# Patient Record
Sex: Female | Born: 1950
Health system: Southern US, Community
[De-identification: ages and names within clinical notes are randomized; demographics above are authoritative.]

## PROBLEM LIST (undated history)

## (undated) DIAGNOSIS — E785 Hyperlipidemia, unspecified: Secondary | ICD-10-CM

## (undated) DIAGNOSIS — H269 Unspecified cataract: Secondary | ICD-10-CM

## (undated) DIAGNOSIS — H409 Unspecified glaucoma: Secondary | ICD-10-CM

## (undated) DIAGNOSIS — R011 Cardiac murmur, unspecified: Secondary | ICD-10-CM

## (undated) DIAGNOSIS — M1990 Unspecified osteoarthritis, unspecified site: Secondary | ICD-10-CM

## (undated) DIAGNOSIS — C801 Malignant (primary) neoplasm, unspecified: Secondary | ICD-10-CM

## (undated) DIAGNOSIS — R42 Dizziness and giddiness: Secondary | ICD-10-CM

## (undated) DIAGNOSIS — I10 Essential (primary) hypertension: Secondary | ICD-10-CM

## (undated) DIAGNOSIS — J302 Other seasonal allergic rhinitis: Secondary | ICD-10-CM

## (undated) DIAGNOSIS — K219 Gastro-esophageal reflux disease without esophagitis: Secondary | ICD-10-CM

## (undated) DIAGNOSIS — F419 Anxiety disorder, unspecified: Secondary | ICD-10-CM

## (undated) DIAGNOSIS — T7840XA Allergy, unspecified, initial encounter: Secondary | ICD-10-CM

## (undated) HISTORY — DX: Unspecified glaucoma: H40.9

## (undated) HISTORY — PX: COLONOSCOPY: SHX174

## (undated) HISTORY — DX: Gastro-esophageal reflux disease without esophagitis: K21.9

## (undated) HISTORY — DX: Unspecified osteoarthritis, unspecified site: M19.90

## (undated) HISTORY — DX: Allergy, unspecified, initial encounter: T78.40XA

## (undated) HISTORY — DX: Malignant (primary) neoplasm, unspecified: C80.1

## (undated) HISTORY — DX: Hyperlipidemia, unspecified: E78.5

## (undated) HISTORY — DX: Other seasonal allergic rhinitis: J30.2

## (undated) HISTORY — DX: Dizziness and giddiness: R42

## (undated) HISTORY — DX: Anxiety disorder, unspecified: F41.9

## (undated) HISTORY — DX: Cardiac murmur, unspecified: R01.1

## (undated) HISTORY — PX: WISDOM TOOTH EXTRACTION: SHX21

## (undated) HISTORY — DX: Essential (primary) hypertension: I10

## (undated) HISTORY — DX: Unspecified cataract: H26.9

## (undated) SURGERY — MANOMETRY, ESOPHAGUS

## (undated) SURGERY — Surgical Case
Anesthesia: *Unknown

---

## 1970-11-27 DIAGNOSIS — C55 Malignant neoplasm of uterus, part unspecified: Secondary | ICD-10-CM

## 1970-11-27 HISTORY — PX: ABDOMINAL HYSTERECTOMY: SHX81

## 1970-11-27 HISTORY — DX: Malignant neoplasm of uterus, part unspecified: C55

## 1998-03-29 ENCOUNTER — Other Ambulatory Visit: Admission: RE | Admit: 1998-03-29 | Discharge: 1998-03-29 | Payer: Self-pay | Admitting: Obstetrics

## 1999-03-21 ENCOUNTER — Other Ambulatory Visit: Admission: RE | Admit: 1999-03-21 | Discharge: 1999-03-21 | Payer: Self-pay | Admitting: Obstetrics

## 1999-04-26 ENCOUNTER — Ambulatory Visit (HOSPITAL_COMMUNITY): Admission: RE | Admit: 1999-04-26 | Discharge: 1999-04-26 | Payer: Self-pay | Admitting: *Deleted

## 2000-02-27 ENCOUNTER — Other Ambulatory Visit: Admission: RE | Admit: 2000-02-27 | Discharge: 2000-02-27 | Payer: Self-pay | Admitting: Obstetrics

## 2000-04-27 ENCOUNTER — Ambulatory Visit (HOSPITAL_COMMUNITY): Admission: RE | Admit: 2000-04-27 | Discharge: 2000-04-27 | Payer: Self-pay | Admitting: *Deleted

## 2000-05-03 ENCOUNTER — Encounter: Payer: Self-pay | Admitting: Obstetrics

## 2000-05-03 ENCOUNTER — Encounter: Admission: RE | Admit: 2000-05-03 | Discharge: 2000-05-03 | Payer: Self-pay | Admitting: Obstetrics

## 2001-05-06 ENCOUNTER — Encounter: Admission: RE | Admit: 2001-05-06 | Discharge: 2001-05-06 | Payer: Self-pay | Admitting: Obstetrics

## 2001-05-06 ENCOUNTER — Encounter: Payer: Self-pay | Admitting: Obstetrics

## 2002-05-06 ENCOUNTER — Encounter: Admission: RE | Admit: 2002-05-06 | Discharge: 2002-05-06 | Payer: Self-pay | Admitting: Obstetrics

## 2002-05-06 ENCOUNTER — Encounter: Payer: Self-pay | Admitting: Obstetrics

## 2003-05-11 ENCOUNTER — Encounter: Admission: RE | Admit: 2003-05-11 | Discharge: 2003-05-11 | Payer: Self-pay | Admitting: Obstetrics

## 2003-05-11 ENCOUNTER — Encounter: Payer: Self-pay | Admitting: Obstetrics

## 2004-06-16 ENCOUNTER — Encounter: Admission: RE | Admit: 2004-06-16 | Discharge: 2004-06-16 | Payer: Self-pay | Admitting: Internal Medicine

## 2005-06-26 ENCOUNTER — Encounter: Admission: RE | Admit: 2005-06-26 | Discharge: 2005-06-26 | Payer: Self-pay | Admitting: Internal Medicine

## 2006-06-28 ENCOUNTER — Encounter: Admission: RE | Admit: 2006-06-28 | Discharge: 2006-06-28 | Payer: Self-pay | Admitting: Internal Medicine

## 2007-07-02 ENCOUNTER — Encounter: Admission: RE | Admit: 2007-07-02 | Discharge: 2007-07-02 | Payer: Self-pay | Admitting: Internal Medicine

## 2007-07-26 ENCOUNTER — Ambulatory Visit: Payer: Self-pay | Admitting: Gastroenterology

## 2007-08-13 ENCOUNTER — Ambulatory Visit: Payer: Self-pay | Admitting: Gastroenterology

## 2007-08-13 ENCOUNTER — Encounter: Payer: Self-pay | Admitting: Gastroenterology

## 2007-09-18 ENCOUNTER — Ambulatory Visit: Payer: Self-pay | Admitting: Gastroenterology

## 2007-10-03 ENCOUNTER — Ambulatory Visit: Payer: Self-pay | Admitting: Gastroenterology

## 2007-10-16 ENCOUNTER — Ambulatory Visit: Payer: Self-pay | Admitting: Gastroenterology

## 2007-10-16 ENCOUNTER — Encounter: Payer: Self-pay | Admitting: Gastroenterology

## 2007-11-13 ENCOUNTER — Ambulatory Visit: Payer: Self-pay | Admitting: Gastroenterology

## 2008-01-24 DIAGNOSIS — K2980 Duodenitis without bleeding: Secondary | ICD-10-CM | POA: Insufficient documentation

## 2008-01-24 DIAGNOSIS — K3189 Other diseases of stomach and duodenum: Secondary | ICD-10-CM | POA: Insufficient documentation

## 2008-01-24 DIAGNOSIS — K253 Acute gastric ulcer without hemorrhage or perforation: Secondary | ICD-10-CM | POA: Insufficient documentation

## 2008-01-24 DIAGNOSIS — D126 Benign neoplasm of colon, unspecified: Secondary | ICD-10-CM | POA: Insufficient documentation

## 2008-01-24 DIAGNOSIS — R1013 Epigastric pain: Secondary | ICD-10-CM

## 2008-07-09 ENCOUNTER — Encounter: Admission: RE | Admit: 2008-07-09 | Discharge: 2008-07-09 | Payer: Self-pay | Admitting: Internal Medicine

## 2009-07-15 ENCOUNTER — Encounter: Admission: RE | Admit: 2009-07-15 | Discharge: 2009-07-15 | Payer: Self-pay | Admitting: Internal Medicine

## 2009-08-25 ENCOUNTER — Encounter: Admission: RE | Admit: 2009-08-25 | Discharge: 2009-08-25 | Payer: Self-pay | Admitting: Internal Medicine

## 2010-05-10 ENCOUNTER — Encounter: Admission: RE | Admit: 2010-05-10 | Discharge: 2010-08-08 | Payer: Self-pay | Admitting: Neurology

## 2010-07-21 ENCOUNTER — Encounter: Admission: RE | Admit: 2010-07-21 | Discharge: 2010-07-21 | Payer: Self-pay | Admitting: Internal Medicine

## 2010-08-11 ENCOUNTER — Encounter: Admission: RE | Admit: 2010-08-11 | Discharge: 2010-08-29 | Payer: Self-pay | Admitting: Neurology

## 2010-12-27 NOTE — Procedures (Signed)
Summary: Gastroenterology EGD  Gastroenterology EGD   Imported By: Lowry Ram CMA 01/24/2008 11:30:07  _____________________________________________________________________  External Attachment:    Type:   Image     Comment:   External Document

## 2011-04-11 NOTE — Letter (Signed)
September 18, 2007    [name]. Merla Riches, M.D.  [address]  [place], [place] [address]   RE:  [name], [name]  MRN:  [id number]  /  DOB:  [birth year]   Dear Dr. Merla Riches:   Upon your kind referral, I had the pleasure of evaluating your patient  and I am pleased to offer my findings.  I saw Breanna Sharp in the  office today.  Enclosed is a copy of my progress note that details my  findings and recommendations.   Thank you for the opportunity to participate in your patient's care.    Sincerely,      Barbette Hair. Arlyce Dice, MD,FACG  Electronically Signed    RDK/MedQ  DD: 09/18/2007  DT: 09/19/2007  Job #: [id number]

## 2011-04-11 NOTE — Assessment & Plan Note (Signed)
 HEALTHCARE                         GASTROENTEROLOGY OFFICE NOTE   Breanna Sharp, Breanna Sharp                      MRN:          [id number]  DATE:11/13/2007                            DOB:          Jul 19, 1951    PROBLEM:  Dyspepsia.   [name] has returned for re-evaluation. Upper endoscopy  demonstrated a pre-pyloric ulcer. Biopsies were negative for H-pylori.  She also has some mild duodenitis.   On Pepcid 40 mg a day, her abdominal pain has entirely subsided.   PHYSICAL EXAMINATION:  Pulse 76, blood pressure 118/76, weight 170.   IMPRESSION:  Abdominal pain, secondary to pre-pyloric ulcer, resolved.   RECOMMENDATIONS:  Complete six week course of Pepcid. [name] will  return as needed.     Barbette Hair. Arlyce Dice, MD,FACG  Electronically Signed    RDK/MedQ  DD: 11/13/2007  DT: 11/13/2007  Job #: [id number]   cc:   [name]. Merla Riches, M.D.

## 2011-04-11 NOTE — Assessment & Plan Note (Signed)
[facility]                         GASTROENTEROLOGY OFFICE NOTE   Breanna, Sharp                      MRN:          [id number]  DATE:09/18/2007                            DOB:          05/18/1951    REASON FOR CONSULTATION:  Abdominal distention.   Breanna Sharp is a 60 year old African-American female, referred through  the courtesy of Dr. Merla Riches for evaluation.  For the last several  years, she has noticed abdominal bloating.  This is a constant problem,  not particularly worsened postprandially.  She has gained 30 pounds over  the past 3 years, but she is unsure that this is due to her gain in  weight.  She underwent an abdominal ultrasound, though the results are  not known.  She denies nausea, pyrosis, or abdominal pain, per se.  Colonoscopy on August 13, 2007, demonstrated a hyperplastic polyp.  She denies change of bowel habits or hematochezia.  She also denies  excess eructations or flatus.  She has a history of adenomatous colon  polyps.   PAST MEDICAL HISTORY:  Hysterectomy.   FAMILY HISTORY:  Noncontributory.   MEDICATIONS:  HCTZ, fexofenadine, and baby aspirin.   She neither smokes nor drinks.  She is married and works for [employer].   REVIEW OF SYSTEMS:  Positive for some shortness of breath and muscle  spasms.   EXAM:  Pulse 78, blood pressure 104/70, weight 170.   PHYSICAL EXAMINATION:  HEENT: EOMI.  PERRLA.  Sclerae are anicteric.  Conjunctivae are pink.  NECK:  Supple without thyromegaly, adenopathy or carotid bruits.  CHEST:  Clear to auscultation and percussion without adventitious  sounds.  CARDIAC:  Regular rhythm; normal S1 S2.  There are no murmurs, gallops  or rubs.  ABDOMEN:  There is very slight distention.  Abdomen is full.  There is  no frank succussion splash or obvious ascites.  There are no abdominal  masses or organomegaly.  EXTREMITIES:  Full range of motion.  No cyanosis, clubbing  or edema.  RECTAL:  Deferred.   IMPRESSION:  Persistent abdominal fullness and distention.  It is not  certain whether this is due to pannus or whether she has underlying  bowel distention or perhaps even ascites.  The ascites is not evident by  clinical exam.   RECOMMENDATION:  1. Review previous ultrasound.  2. To consider upper endoscopy per the patient's request.     Barbette Hair. Arlyce Dice, MD,FACG  Electronically Signed    RDK/MedQ  DD: 09/18/2007  DT: 09/19/2007  Job #: [id number]   cc:   Breanna Sharp. Merla Riches, M.D.

## 2011-04-14 NOTE — Procedures (Signed)
[facility]  Patient:    Breanna Sharp, SCHOMBURG                      MRN: [id number] Proc. Date: 04/27/00 Adm. Date:  [id number] Disc. Date: [id number] Attending:  Sharyn Dross                           Procedure Report  PRE PROCEDURE DIAGNOSIS:  History of colon polyps.  POST PROCEDURE DIAGNOSIS:  Normal colonoscopic examination to the cecum.  PROCEDURE:  Colonoscopy.  MEDICATIONS:  Demerol 60 mg IV and Versed 7 mg IV over a ten minute period of time.  INSTRUMENT:  Olympus video pancolonoscope.  ENDOSCOPIST:  Sharyn Dross., M.D.  INDICATIONS FOR PROCEDURE:  This pleasant 60 year old female was referred for evaluation at this time.  She was relatively stable without any major complaints at this point.  There is a long history of a small colon polyp in the past.  The patient was brought back in for reevaluation at this time.  OBJECTIVE:  GENERAL:  Very pleasant female who appears to be in no acute distress.  VITAL SIGNS:  Stable.  HEENT:  Anicteric.  NECK:  Supple.  LUNGS:  Clear.  HEART:  Regular rate and rhythm without heaves, thrills, murmurs or gallops.  ABDOMEN:  Soft.  No tenderness, no hepatosplenomegaly.  EXTREMITIES:  Unremarkable.  PLAN:  Proceed with colonoscopic examination.  INFORMED CONSENT:  The patient was advised of the procedure, indications and the risks involved.  The patient has agreed to have the procedure performed. The video was reviewed and consent form obtained.  PREOPERATIVE PREPARATION:  the patient was brought to the endoscopy unit, where an IV for sedative medications was used.  Monitors were placed on the patient to monitor the patients vital signs and oxygen saturation.  Nasal oxygen at 2 L per minute was used and, once adequate sedation was performed, the procedure was begun.  BOWEL PREPARATION:  The patient was given GoLYTELY and Reglan as bowel prep. To tolerated the prep well without any  apparent complications.  The quality of the prep was excellent.  DESCRIPTION OF PROCEDURE:  The instrument was advanced with the patient lying in the left lateral decubitus position approximately 97 cm into the proximal colon to the cecum.  This was confirmed by palpation, transillumination as well as visualization of the appendiceal orifice and the ileocecal valve.  There appeared to be no gross abnormalities such as masses, polyps or stricture lesions appreciated.  The vascular pattern appeared to be well within normal limits throughout the entire colon.  The mucosal pattern showed no evidence of any granular pattern or diverticular changes at this time. There was no evidence of internal or external hemorrhoids upon exiting from the area.  There was no increased tortuosity of the colon that was noted.  The instrument was removed per rectum without difficulty without any evidence of internal or external hemorrhoids noted.  The patient tolerated the procedure well.  TREATMENT: 1. Conservative management. 2. Will have the patient follow up with me in the office at this time.  LEVEL OF DIFFICULTY:  1/5.  RECOMMENDATIONS:  Use the same standard instruments for any further procedures at this time. DD:  04/27/00 TD:  05/01/00 Job: [id number]

## 2011-06-12 ENCOUNTER — Other Ambulatory Visit: Payer: Self-pay | Admitting: Internal Medicine

## 2011-06-12 DIAGNOSIS — Z1231 Encounter for screening mammogram for malignant neoplasm of breast: Secondary | ICD-10-CM

## 2011-07-25 ENCOUNTER — Ambulatory Visit
Admission: RE | Admit: 2011-07-25 | Discharge: 2011-07-25 | Disposition: A | Discharge status: Home / Self Care | Payer: Managed Care, Other (non HMO) | Source: Ambulatory Visit | Attending: Internal Medicine | Admitting: Internal Medicine

## 2011-07-25 DIAGNOSIS — Z1231 Encounter for screening mammogram for malignant neoplasm of breast: Secondary | ICD-10-CM

## 2012-05-15 ENCOUNTER — Ambulatory Visit (INDEPENDENT_AMBULATORY_CARE_PROVIDER_SITE_OTHER): Payer: Managed Care, Other (non HMO) | Admitting: Family Medicine

## 2012-05-15 VITALS — BP 138/85 | HR 75 | Temp 97.5°F | Resp 16 | Ht 64.0 in | Wt 170.0 lb

## 2012-05-15 DIAGNOSIS — N898 Other specified noninflammatory disorders of vagina: Secondary | ICD-10-CM

## 2012-05-15 DIAGNOSIS — B9689 Other specified bacterial agents as the cause of diseases classified elsewhere: Secondary | ICD-10-CM

## 2012-05-15 DIAGNOSIS — N760 Acute vaginitis: Secondary | ICD-10-CM

## 2012-05-15 LAB — POCT WET PREP WITH KOH
Clue Cells Wet Prep HPF POC: 100
KOH Prep POC: NEGATIVE
Trichomonas, UA: NEGATIVE
Yeast Wet Prep HPF POC: NEGATIVE

## 2012-05-15 MED ORDER — METRONIDAZOLE 500 MG PO TABS: 500.0000 mg | ORAL_TABLET | Freq: Two times a day (BID) | ORAL | Status: AC

## 2012-05-15 NOTE — Progress Notes (Signed)
Patient Name: Breanna Sharp Date of Birth: 05/14/1951 Medical Record Number: [id number] Gender: female Date of Encounter: 05/15/2012  History of Present Illness:  Breanna Sharp is a 61 y.o. very pleasant female patient who presents with the following:  Here today with concern regarding a vaginal yeast infection. She has not had one "in years."  She has noted no itching, but she does have "dampness" and mild odor.  No vaginal bleeding.  No dysuria, no unusual urinary frequency.    Patient Active Problem List  Diagnosis  . COLONIC POLYPS  . ACUT GASTR ULCER W/O MENTION HEMORR PERF/OBST  . DUODENITIS  . DYSPEPSIA   No past medical history on file. No past surgical history on file. History  Substance Use Topics  . Smoking status: Never Smoker   . Smokeless tobacco: Not on file  . Alcohol Use: 0.0 oz/week     social   No family history on file. Allergies  Allergen Reactions  . Statins     Muscle cramps    Medication list has been reviewed and updated.  Prior to Admission medications   Medication Sig Start Date End Date Taking? Authorizing Provider  calcium carbonate (OS-CAL) 600 MG TABS Take 600 mg by mouth 2 (two) times daily with a meal.   Yes Historical Provider, MD  cholecalciferol (VITAMIN D) 1000 UNITS tablet Take 1,000 Units by mouth daily.   Yes Historical Provider, MD  fexofenadine (ALLEGRA) 180 MG tablet Take 180 mg by mouth daily.   Yes Historical Provider, MD  fluticasone (FLONASE) 50 MCG/ACT nasal spray Place 2 sprays into the nose daily.   Yes Historical Provider, MD  lisinopril-hydrochlorothiazide (PRINZIDE,ZESTORETIC) 10-12.5 MG per tablet Take 1 tablet by mouth daily.   Yes Historical Provider, MD  rosuvastatin (CRESTOR) 10 MG tablet Take 10 mg by mouth daily.   Yes Historical Provider, MD    Review of Systems:  As per HPI- otherwise negative. She does feel that she is at any risk of STI  Physical Examination: Filed Vitals:   05/15/12 0839   BP: 138/85  Pulse: 75  Temp: 97.5 F (36.4 C)  Resp: 16   Filed Vitals:   05/15/12 0839  Height: 5\' 4"  (1.626 m)  Weight: 170 lb (77.111 kg)   Body mass index is 29.18 kg/(m^2). Ideal Body Weight: Weight in (lb) to have BMI = 25: 145.3   GEN: WDWN, NAD, Non-toxic, A & O x 3 HEENT: Atraumatic, Normocephalic. Neck supple. No masses, No LAD. Ears and Nose: No external deformity. CV: RRR, No M/G/R. No JVD. No thrill. No extra heart sounds. PULM: CTA B, no wheezes, crackles, rhonchi. No retractions. No resp. distress. No accessory muscle use. ABD: S, NT, ND, +BS. No rebound. No HSM. EXTR: No c/c/e NEURO Normal gait.  PSYCH: Normally interactive. Conversant. Not depressed or anxious appearing.  Calm demeanor.  GU: normal internal and external exam.  She thinks she may have had an oophorectomy at some point, but is not sure  Results for orders placed in visit on 05/15/12  POCT WET PREP WITH KOH      Component Value Range   Trichomonas, UA Negative     Clue Cells Wet Prep HPF POC 100%     Epithelial Wet Prep HPF POC 6-8     Yeast Wet Prep HPF POC NEG     Bacteria Wet Prep HPF POC 3+     RBC Wet Prep HPF POC 1-3     WBC Wet  Prep HPF POC 10-15     KOH Prep POC Negative     Assessment and Plan: 1. Vaginal Discharge  POCT Wet Prep with KOH  2. Bacterial vaginosis  metroNIDAZOLE (FLAGYL) 500 MG tablet   Treat BV as above.  Patient (or parent if minor) instructed to return to clinic or call if not better in 3-4 day(s).    Abbe Amsterdam, MD

## 2012-08-02 ENCOUNTER — Other Ambulatory Visit: Payer: Self-pay | Admitting: Internal Medicine

## 2012-08-06 ENCOUNTER — Other Ambulatory Visit: Payer: Self-pay | Admitting: Internal Medicine

## 2012-08-06 DIAGNOSIS — Z1231 Encounter for screening mammogram for malignant neoplasm of breast: Secondary | ICD-10-CM

## 2012-08-09 ENCOUNTER — Ambulatory Visit: Payer: Managed Care, Other (non HMO)

## 2012-08-14 ENCOUNTER — Encounter: Payer: Self-pay | Admitting: Internal Medicine

## 2012-08-14 ENCOUNTER — Ambulatory Visit (INDEPENDENT_AMBULATORY_CARE_PROVIDER_SITE_OTHER): Payer: Managed Care, Other (non HMO) | Admitting: Internal Medicine

## 2012-08-14 VITALS — BP 140/78 | HR 75 | Temp 98.1°F | Resp 16 | Ht 63.5 in | Wt 164.4 lb

## 2012-08-14 DIAGNOSIS — E785 Hyperlipidemia, unspecified: Secondary | ICD-10-CM

## 2012-08-14 DIAGNOSIS — Z23 Encounter for immunization: Secondary | ICD-10-CM

## 2012-08-14 DIAGNOSIS — Z0000 Encounter for general adult medical examination without abnormal findings: Secondary | ICD-10-CM

## 2012-08-14 DIAGNOSIS — Z6828 Body mass index (BMI) 28.0-28.9, adult: Secondary | ICD-10-CM | POA: Insufficient documentation

## 2012-08-14 DIAGNOSIS — R42 Dizziness and giddiness: Secondary | ICD-10-CM | POA: Insufficient documentation

## 2012-08-14 DIAGNOSIS — I10 Essential (primary) hypertension: Secondary | ICD-10-CM

## 2012-08-14 LAB — CBC WITH DIFFERENTIAL/PLATELET
Basophils Absolute: 0 10*3/uL (ref 0.0–0.1)
Basophils Relative: 0 % (ref 0–1)
Eosinophils Absolute: 0.1 10*3/uL (ref 0.0–0.7)
Eosinophils Relative: 1 % (ref 0–5)
HCT: 38.6 % (ref 36.0–46.0)
Hemoglobin: 12.9 g/dL (ref 12.0–15.0)
Lymphocytes Relative: 31 % (ref 12–46)
Lymphs Abs: 1.8 10*3/uL (ref 0.7–4.0)
MCH: 27.8 pg (ref 26.0–34.0)
MCHC: 33.4 g/dL (ref 30.0–36.0)
MCV: 83.2 fL (ref 78.0–100.0)
Monocytes Absolute: 0.5 10*3/uL (ref 0.1–1.0)
Monocytes Relative: 8 % (ref 3–12)
Neutro Abs: 3.4 10*3/uL (ref 1.7–7.7)
Neutrophils Relative %: 60 % (ref 43–77)
Platelets: 269 10*3/uL (ref 150–400)
RBC: 4.64 MIL/uL (ref 3.87–5.11)
RDW: 13.1 % (ref 11.5–15.5)
WBC: 5.8 10*3/uL (ref 4.0–10.5)

## 2012-08-14 LAB — COMPREHENSIVE METABOLIC PANEL
ALT: 22 U/L (ref 0–35)
AST: 22 U/L (ref 0–37)
Albumin: 4.8 g/dL (ref 3.5–5.2)
Alkaline Phosphatase: 64 U/L (ref 39–117)
BUN: 14 mg/dL (ref 6–23)
CO2: 29 mEq/L (ref 19–32)
Calcium: 10 mg/dL (ref 8.4–10.5)
Chloride: 102 mEq/L (ref 96–112)
Creat: 0.77 mg/dL (ref 0.50–1.10)
Glucose, Bld: 107 mg/dL — ABNORMAL HIGH (ref 70–99)
Potassium: 3.9 mEq/L (ref 3.5–5.3)
Sodium: 140 mEq/L (ref 135–145)
Total Bilirubin: 0.6 mg/dL (ref 0.3–1.2)
Total Protein: 7.2 g/dL (ref 6.0–8.3)

## 2012-08-14 LAB — POCT URINALYSIS DIPSTICK
Bilirubin, UA: NEGATIVE
Blood, UA: NEGATIVE
Glucose, UA: NEGATIVE
Ketones, UA: NEGATIVE
Leukocytes, UA: NEGATIVE
Nitrite, UA: NEGATIVE
Protein, UA: NEGATIVE
Spec Grav, UA: 1.01
Urobilinogen, UA: 0.2
pH, UA: 5.5

## 2012-08-14 LAB — LIPID PANEL
Cholesterol: 213 mg/dL — ABNORMAL HIGH (ref 0–200)
HDL: 55 mg/dL (ref >39)
LDL Cholesterol: 134 mg/dL — ABNORMAL HIGH (ref 0–99)
Total CHOL/HDL Ratio: 3.9 Ratio
Triglycerides: 122 mg/dL (ref <150)
VLDL: 24 mg/dL (ref 0–40)

## 2012-08-14 MED ORDER — LISINOPRIL-HYDROCHLOROTHIAZIDE 10-12.5 MG PO TABS: 1.0000 | ORAL_TABLET | Freq: Every day | ORAL | Status: DC

## 2012-08-14 MED ORDER — FLUTICASONE PROPIONATE 50 MCG/ACT NA SUSP: 2.0000 | Freq: Every day | NASAL | Status: DC

## 2012-08-14 NOTE — Progress Notes (Signed)
  Subjective:    Patient ID: Breanna Sharp, female    DOB: 06/21/1951, 61 y.o.   MRN: [id number]  HPIHere for annual physical Patient Active Problem List  Diagnosis  . COLONIC POLYPS  . ACUT GASTR ULCER W/O MENTION HEMORR PERF/OBST  . Vertigo---Has done very well with posture exercises-brandt daroff  . HTN (hypertension)---Outside pressures good  . Hyperlipidemia--No side effects of medication  . BMI 28.0-28.9,adult  Current outpatient prescriptions:calcium carbonate (OS-CAL) 600 MG TABS, Take 600 mg by mouth 2 (two) times daily with a meal., Disp: , Rfl: ;  cholecalciferol (VITAMIN D) 1000 UNITS tablet, Take 1,000 Units by mouth daily., Disp: , Rfl: ;  CRESTOR 10 MG tablet, TAKE 1 TABLET DAILY IN THE EVENING., Disp: 90 tablet, Rfl: 3;  fexofenadine (ALLEGRA) 180 MG tablet, Take 180 mg by mouth daily., Disp: , Rfl:  lisinopril-hydrochlorothiazide (PRINZIDE,ZESTORETIC) 10-12.5 MG per tablet, Take 1 tablet by mouth daily., Disp: 90 tablet, Rfl: 3;  DISCONTD: lisinopril-hydrochlorothiazide (PRINZIDE,ZESTORETIC) 10-12.5 MG per tablet, Take 1 tablet by mouth daily., Disp: , Rfl: ;  fluticasone (FLONASE) 50 MCG/ACT nasal spray, Place 2 sprays into the nose daily., Disp: 16 g, Rfl: 10 DISCONTD: fluticasone (FLONASE) 50 MCG/ACT nasal spray, Place 2 sprays into the nose daily., Disp: , Rfl:   Is feeling well and 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   HENT: Positive for neck pain and neck stiffness.   Eyes: Negative.   Respiratory: Negative.   Cardiovascular: Negative.   Gastrointestinal: Negative.   Genitourinary: Negative.   Skin: Negative.   Neurological: Positive for dizziness and numbness.  Hematological: Negative.   Psychiatric/Behavioral: Negative.        Objective:   Physical Exam  Filed Vitals:   08/14/12 1037  BP: 140/78  Pulse: 75  Temp: 98.1 F (36.7 C)    Resp: 16   HEENT clear No thyromegaly or adenopathy Heart regular without murmurs clicks or rubs No carotid or abdominal bruits Lungs clear Abdomen supple without organomegaly or masses Range of motion about large joints stable Back straight Straight leg raise within normal limits Extremities with full peripheral pulses and no edema Neurological intact Psychiatric stable      Assessment & Plan:  Annual exam 1. Routine general medical examination at a health care facility  IFOBT POC (occult bld, rslt in office), CBC with Differential, Lipid panel, Comprehensive metabolic panel, TSH, POCT urinalysis dipstick  2. Vertigo    3. HTN (hypertension)  Lipid panel  4. hyperlipidemia  Lipid panel  5. BMI 28.0-28.9,adult  Lipid panel  6. Need for prophylactic vaccination and inoculation against influenza  Flu vaccine greater than or equal to 3yo with preservative IM   2 continue to focus on weight loss although this has been difficult Meds ordered this encounter  Medications  . lisinopril-hydrochlorothiazide (PRINZIDE,ZESTORETIC) 10-12.5 MG per tablet    Sig: Take 1 tablet by mouth daily.    Dispense:  90 tablet    Refill:  3  . fluticasone (FLONASE) 50 MCG/ACT nasal spray    Sig: Place 2 sprays into the nose daily.    Dispense:  16 g    Refill:  10   Has just been prescribed 90 days of crest or with 3 refills

## 2012-08-15 LAB — TSH: TSH: 1.145 u[IU]/mL (ref 0.350–4.500)

## 2012-08-16 ENCOUNTER — Ambulatory Visit
Admission: RE | Admit: 2012-08-16 | Discharge: 2012-08-16 | Disposition: A | Discharge status: Home / Self Care | Payer: Managed Care, Other (non HMO) | Source: Ambulatory Visit | Attending: Internal Medicine | Admitting: Internal Medicine

## 2012-08-16 DIAGNOSIS — Z1231 Encounter for screening mammogram for malignant neoplasm of breast: Secondary | ICD-10-CM

## 2012-08-20 ENCOUNTER — Encounter: Payer: Self-pay | Admitting: Internal Medicine

## 2012-09-19 LAB — IFOBT (OCCULT BLOOD): IFOBT: NEGATIVE

## 2013-02-26 ENCOUNTER — Ambulatory Visit (INDEPENDENT_AMBULATORY_CARE_PROVIDER_SITE_OTHER): Payer: 59 | Admitting: Internal Medicine

## 2013-02-26 ENCOUNTER — Encounter: Payer: Self-pay | Admitting: Internal Medicine

## 2013-02-26 VITALS — BP 132/84 | HR 80 | Temp 98.2°F | Resp 18 | Ht 64.0 in | Wt 168.0 lb

## 2013-02-26 DIAGNOSIS — I10 Essential (primary) hypertension: Secondary | ICD-10-CM

## 2013-02-26 DIAGNOSIS — E785 Hyperlipidemia, unspecified: Secondary | ICD-10-CM

## 2013-02-26 LAB — CBC WITH DIFFERENTIAL/PLATELET
Basophils Absolute: 0 10*3/uL (ref 0.0–0.1)
Basophils Relative: 0 % (ref 0–1)
Eosinophils Absolute: 0.1 10*3/uL (ref 0.0–0.7)
Eosinophils Relative: 2 % (ref 0–5)
HCT: 37.6 % (ref 36.0–46.0)
Hemoglobin: 12.3 g/dL (ref 12.0–15.0)
Lymphocytes Relative: 42 % (ref 12–46)
Lymphs Abs: 2.2 10*3/uL (ref 0.7–4.0)
MCH: 27.2 pg (ref 26.0–34.0)
MCHC: 32.7 g/dL (ref 30.0–36.0)
MCV: 83.2 fL (ref 78.0–100.0)
Monocytes Absolute: 0.4 10*3/uL (ref 0.1–1.0)
Monocytes Relative: 8 % (ref 3–12)
Neutro Abs: 2.6 10*3/uL (ref 1.7–7.7)
Neutrophils Relative %: 48 % (ref 43–77)
Platelets: 244 10*3/uL (ref 150–400)
RBC: 4.52 MIL/uL (ref 3.87–5.11)
RDW: 13.8 % (ref 11.5–15.5)
WBC: 5.3 10*3/uL (ref 4.0–10.5)

## 2013-02-26 LAB — LIPID PANEL
Cholesterol: 317 mg/dL — ABNORMAL HIGH (ref 0–200)
HDL: 53 mg/dL (ref >39)
LDL Cholesterol: 217 mg/dL — ABNORMAL HIGH (ref 0–99)
Total CHOL/HDL Ratio: 6 Ratio
Triglycerides: 236 mg/dL — ABNORMAL HIGH (ref <150)
VLDL: 47 mg/dL — ABNORMAL HIGH (ref 0–40)

## 2013-02-26 LAB — COMPREHENSIVE METABOLIC PANEL
ALT: 16 U/L (ref 0–35)
AST: 17 U/L (ref 0–37)
Albumin: 4.3 g/dL (ref 3.5–5.2)
Alkaline Phosphatase: 60 U/L (ref 39–117)
BUN: 13 mg/dL (ref 6–23)
CO2: 27 mEq/L (ref 19–32)
Calcium: 9.4 mg/dL (ref 8.4–10.5)
Chloride: 104 mEq/L (ref 96–112)
Creat: 0.85 mg/dL (ref 0.50–1.10)
Glucose, Bld: 111 mg/dL — ABNORMAL HIGH (ref 70–99)
Potassium: 4.1 mEq/L (ref 3.5–5.3)
Sodium: 140 mEq/L (ref 135–145)
Total Bilirubin: 0.7 mg/dL (ref 0.3–1.2)
Total Protein: 6.5 g/dL (ref 6.0–8.3)

## 2013-02-26 NOTE — Progress Notes (Signed)
  Subjective:    Patient ID: Breanna Sharp, female    DOB: [birth year], 62 y.o.   MRN: [id number]  HPIf/u HTN HL-at last labs started crestor as all prior statins created muscle complaints. This also leg to pain and easy fatigability of thigh muscles//she stopped it and sxtoms resolved  Has started exer prg//oatmeal/considering other supplements(Ginger, lemon garlic vinegar, honey)    Review of Systems Chest pain or palpitations  No headache  No visual changes  No peripheral edema     Objective:   Physical Exam BP 132/84  Pulse 80  Temp(Src) 98.2 F (36.8 C) (Oral)  Resp 18  Ht 5\' 4"  (1.626 m)  Wt 168 lb (76.204 kg)  BMI 28.82 kg/m2  SpO2 96% PERRLA/EOM conj Ht-reg Extr=no edema       Assessment & Plan   Problem #1 hyperlipidemia with intolerance to statins Recheck lipids Continue weight loss efforts Continue supplements Mail information with labs  Problem #2 hypertension Stable meds  Recheck 3-4 months at her request to see what her efforts do for reducing lipid profile

## 2013-02-28 ENCOUNTER — Encounter: Payer: Self-pay | Admitting: Internal Medicine

## 2013-03-18 ENCOUNTER — Ambulatory Visit (INDEPENDENT_AMBULATORY_CARE_PROVIDER_SITE_OTHER): Payer: 59 | Admitting: Internal Medicine

## 2013-03-18 VITALS — BP 142/70 | HR 75 | Temp 98.7°F | Resp 16 | Ht 64.0 in | Wt 162.0 lb

## 2013-03-18 DIAGNOSIS — J329 Chronic sinusitis, unspecified: Secondary | ICD-10-CM

## 2013-03-18 MED ORDER — AMOXICILLIN 500 MG PO CAPS: 1000.0000 mg | ORAL_CAPSULE | Freq: Two times a day (BID) | ORAL | Status: DC

## 2013-03-18 NOTE — Progress Notes (Signed)
  Subjective:    Patient ID: Breanna Sharp, female    DOB: 30-Apr-1951, 62 y.o.   MRN: [id number]  HPI Has congestion and green nasal discharge, not much sneezing, does have allergys. No sob, cp.   Review of Systems htn    Objective:   Physical Exam  Vitals reviewed. Constitutional: She is oriented to person, place, and time. She appears well-developed and well-nourished. No distress.  HENT:  Right Ear: External ear normal.  Left Ear: External ear normal.  Nose: Mucosal edema and rhinorrhea present. Right sinus exhibits maxillary sinus tenderness and frontal sinus tenderness. Left sinus exhibits maxillary sinus tenderness and frontal sinus tenderness.  Mouth/Throat: Oropharyngeal exudate present.  Cardiovascular: Normal rate.   Pulmonary/Chest: Effort normal and breath sounds normal.  Neurological: She is alert and oriented to person, place, and time. She exhibits normal muscle tone. Coordination normal.          Assessment & Plan:  Amoxil/Sinus care/Air travel care

## 2013-03-18 NOTE — Patient Instructions (Signed)

## 2013-03-28 ENCOUNTER — Ambulatory Visit: Payer: 59

## 2013-03-28 ENCOUNTER — Ambulatory Visit (INDEPENDENT_AMBULATORY_CARE_PROVIDER_SITE_OTHER): Payer: 59 | Admitting: Family Medicine

## 2013-03-28 VITALS — BP 130/79 | HR 78 | Temp 98.0°F | Resp 16 | Ht 64.0 in | Wt 164.0 lb

## 2013-03-28 DIAGNOSIS — J309 Allergic rhinitis, unspecified: Secondary | ICD-10-CM

## 2013-03-28 DIAGNOSIS — R0602 Shortness of breath: Secondary | ICD-10-CM

## 2013-03-28 DIAGNOSIS — J329 Chronic sinusitis, unspecified: Secondary | ICD-10-CM

## 2013-03-28 DIAGNOSIS — I10 Essential (primary) hypertension: Secondary | ICD-10-CM

## 2013-03-28 DIAGNOSIS — E785 Hyperlipidemia, unspecified: Secondary | ICD-10-CM

## 2013-03-28 MED ORDER — IPRATROPIUM BROMIDE 0.06 % NA SOLN: 2.0000 | Freq: Four times a day (QID) | NASAL | Status: DC

## 2013-03-28 NOTE — Progress Notes (Addendum)
Subjective:    Patient ID: Breanna Sharp, female    DOB: [birth year], 62 y.o.   MRN: [id number]  HPI Breanna Sharp is a 62 y.o. female Seen 03/18/13 - diagnosed with sinusitis, treated with amoxicillin 1000mg  BID x 10 days.   Now complains of shortness of breath with climbing stairs, or prolonged activity - cooking, going back and forth. Noticed 4 days ago as up and doing more. No recent fever. Min cough - improved.  Still some sinus drainage, clear. This has improved after antibiotic. Takes flonase and zyrtec.    No chest pain.  No known heart disease, but does have HTN, and hx of hyperlipidemia - stopped statin a month ago d/t myalgias. No PND, No orthopnea - 1 pillow. Stress test in approx 2003 -- Dr. Elsie Sharp, for abnormal ekg. Unknown results.  No hx of asthma, copd known.   FH: MI in mom at 24yo.    Review of Systems  Constitutional: Negative for fever and chills.  Respiratory: Positive for cough (min) and shortness of breath (with exertion only. ). Negative for chest tightness and wheezing.   Cardiovascular: Negative for chest pain, palpitations and leg swelling.  Gastrointestinal: Negative for abdominal pain.       Objective:   Physical Exam  Vitals reviewed. Constitutional: She is oriented to person, place, and time. She appears well-developed and well-nourished. No distress.  HENT:  Head: Atraumatic. Macrocephalic.  Right Ear: Hearing, tympanic membrane, external ear and ear canal normal.  Left Ear: Hearing, tympanic membrane, external ear and ear canal normal.  Nose: Mucosal edema (min) present. Right sinus exhibits no maxillary sinus tenderness and no frontal sinus tenderness. Left sinus exhibits no maxillary sinus tenderness and no frontal sinus tenderness.  Mouth/Throat: Oropharynx is clear and moist. No oropharyngeal exudate.  Eyes: Conjunctivae and EOM are normal. Pupils are equal, round, and reactive to light.  Neck: Carotid bruit is not present.   Cardiovascular: Normal rate, regular rhythm, normal heart sounds and intact distal pulses.   No murmur heard. Pulmonary/Chest: Effort normal and breath sounds normal. No respiratory distress. She has no wheezes. She has no rhonchi.  Abdominal: Soft. She exhibits no pulsatile midline mass. There is no tenderness.  Neurological: She is alert and oriented to person, place, and time.  Skin: Skin is warm and dry. No rash noted.  Psychiatric: She has a normal mood and affect. Her behavior is normal.   [facility] reading (PRIMARY) by  Dr. Neva Seat: CXR: few increased rll markings. .  EKG: SR, no acute findings.   Ambulatory pulse ox - 98%.      Assessment & Plan:  Breanna Sharp is a 61 y.o. female Unspecified sinusitis (chronic) - Plan: EKG 12-Lead, DG Chest 2 View, ipratropium (ATROVENT) 0.06 % nasal spray - likley treated infectious sx's.  Allergic likely now. See below. rtc if change in color of nasal d/c or worsening.   Allergic rhinitis - Plan: EKG 12-Lead, DG Chest 2 View, cont zyrtec, flonase, add ipratropium (ATROVENT) 0.06 % nasal spray.   HTN (hypertension), Other and unspecified hyperlipidemia, with recent dyspnea on exertion - likely allergic component, vs secondary bronchitis (but minimal cough),  but FH of MI in mom in 70's, and cardiac rf's of age, htn, hyperlipidemia, prior smoker. Refer to [facility] for eval, poosible echo/repeat stress testing.  Er/911 chest pain precautions discussed, and advised to avoid strenuous activity until eval by cardiology. Hold on repeat abx until cxr overread.    Meds ordered this encounter  Medications  . cetirizine (ZYRTEC) 10 MG tablet    Sig: Take 10 mg by mouth daily.  [name] ipratropium (ATROVENT) 0.06 % nasal spray    Sig: Place 2 sprays into the nose 4 (four) times daily.    Dispense:  15 mL    Refill:  1   Patient Instructions  Continue zyrtec and flonase for allergies.  Add atrovent nasal spray.  Avoid exertional activities until seen by  cardiologist. Return to the clinic or go to the nearest emergency room if any of your symptoms worsen or new symptoms occur. If you start having more cough, worsening shortness of breath,or fever - return to clinic or emergency room.

## 2013-03-28 NOTE — Patient Instructions (Addendum)
Continue zyrtec and flonase for allergies.  Add atrovent nasal spray.  Avoid exertional activities until seen by cardiologist. Return to the clinic or go to the nearest emergency room if any of your symptoms worsen or new symptoms occur. If you start having more cough, worsening shortness of breath,or fever - return to clinic or emergency room.

## 2013-04-08 ENCOUNTER — Ambulatory Visit (INDEPENDENT_AMBULATORY_CARE_PROVIDER_SITE_OTHER): Payer: 59 | Admitting: Nurse Practitioner

## 2013-04-08 ENCOUNTER — Encounter: Payer: Self-pay | Admitting: Nurse Practitioner

## 2013-04-08 VITALS — BP 125/82 | HR 76 | Ht 65.0 in | Wt 165.0 lb

## 2013-04-08 DIAGNOSIS — I10 Essential (primary) hypertension: Secondary | ICD-10-CM

## 2013-04-08 DIAGNOSIS — R42 Dizziness and giddiness: Secondary | ICD-10-CM

## 2013-04-08 NOTE — Patient Instructions (Addendum)
Her vertigo  is stable Continue to perform exercises for vertigo at least daily Followup in one year and when necessary

## 2013-04-08 NOTE — Progress Notes (Signed)
HPI: Patient returns for followup after last visit 04/09/2012.  She has been followed in our office since 2010 for acute onset of vertigo. CT of the brain without contrast was normal. She was evaluated by the hearing clinic in July 2011, and at [facility], abnormal calorics revealed a 30% left unilateral weakness consistent with static vestibulopathy. Her hypertensive medicines were changed at that time with improvement in her symptoms. She returns today stating that her last episode of vertigo was in September of last year. She continues to do her vestibular exercises daily. She also walks for exercise and goes to the [facility] She has no new neurologic complaints  ROS:  Dizziness   Physical Exam General: well developed, well nourished, seated, in no evident distress Head: head normocephalic and atraumatic. Oropharynx benign Neck: supple with no carotid or supraclavicular bruits Cardiovascular: regular rate and rhythm, no murmurs  Neurologic Exam Mental Status: Awake and fully alert. Oriented to place and time. Follows 12 and 3 step commands  Mood and affect appropriate.  Cranial Nerves:  Pupils equal, briskly reactive to light. Extraocular movements full without nystagmus. Visual fields full to confrontation. Hearing intact and symmetric to finger snap. Facial sensation intact. Face, tongue, palate move normally and symmetrically. Neck flexion and extension normal.  Motor: Normal bulk and tone. Normal strength in all tested extremity muscles. Sensory.: intact to touch and pinprick and vibratory.  Coordination: Rapid alternating movements normal in all extremities. Finger-to-nose and heel-to-shin performed accurately bilaterally. Gait and Station: Arises from chair without difficulty. Stance is normal. Gait demonstrates normal stride length and balance . Able to heel, toe and tandem walk without difficulty.  Reflexes: 2+ and symmetric. Toes downgoing.     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 Patient to continue her exercises for vertigo at least daily She is continue to walk for exercise for overall health She will follow up yearly and when necessary   Nilda Riggs, GNP-BC APRN

## 2013-04-22 ENCOUNTER — Encounter (INDEPENDENT_AMBULATORY_CARE_PROVIDER_SITE_OTHER): Payer: 59

## 2013-04-22 DIAGNOSIS — R079 Chest pain, unspecified: Secondary | ICD-10-CM

## 2013-04-22 LAB — PULMONARY FUNCTION TEST

## 2013-04-25 ENCOUNTER — Ambulatory Visit: Admitting: Internal Medicine

## 2013-05-01 ENCOUNTER — Encounter: Payer: Self-pay | Admitting: Internal Medicine

## 2013-05-02 ENCOUNTER — Ambulatory Visit (INDEPENDENT_AMBULATORY_CARE_PROVIDER_SITE_OTHER): Payer: 59 | Admitting: Internal Medicine

## 2013-05-02 ENCOUNTER — Encounter: Payer: Self-pay | Admitting: Internal Medicine

## 2013-05-02 VITALS — BP 138/82 | HR 72 | Ht 64.5 in | Wt 165.0 lb

## 2013-05-02 DIAGNOSIS — I10 Essential (primary) hypertension: Secondary | ICD-10-CM

## 2013-05-02 DIAGNOSIS — R0602 Shortness of breath: Secondary | ICD-10-CM

## 2013-05-02 DIAGNOSIS — R0989 Other specified symptoms and signs involving the circulatory and respiratory systems: Secondary | ICD-10-CM

## 2013-05-02 DIAGNOSIS — R0600 Dyspnea, unspecified: Secondary | ICD-10-CM | POA: Insufficient documentation

## 2013-05-02 DIAGNOSIS — E785 Hyperlipidemia, unspecified: Secondary | ICD-10-CM

## 2013-05-02 DIAGNOSIS — J329 Chronic sinusitis, unspecified: Secondary | ICD-10-CM | POA: Insufficient documentation

## 2013-05-02 DIAGNOSIS — R0609 Other forms of dyspnea: Secondary | ICD-10-CM | POA: Insufficient documentation

## 2013-05-02 MED ORDER — PITAVASTATIN CALCIUM 2 MG PO TABS: 2.0000 mg | ORAL_TABLET | Freq: Every day | ORAL | Status: DC

## 2013-05-02 NOTE — Progress Notes (Signed)
OFFICE NOTE  Chief Complaint:  Followup test  Primary Care Physician: Breanna Pearson, MD  HPI:  DETTA Sharp is a pleasant 62 year old female with a history of hypertension and dyslipidemia. Unfortunately she's been intolerant to statins in Breanna past and has failed both Lipitor Zocor Crestor and pravastatin. She is reported some increasing shortness of breath mostly when walking upstairs, however has been struggling with recurrent sinusitis and upper respiratory symptoms. Based on her risk factors, recommended metabolic testing. She underwent cardiopulmonary testing on 04/22/2013. She had maximal effort of 1.08 our ER. Peak VO2 was 108% predicted. Heart rate was 94% predicted. Her heart rate in view to curves were essentially normal with late flattening of her view to curve. Overall Breanna study is low risk. She also went underwent palmar he function testing which showed normal diffusion, volume and flow loops. At her last visit she started taking Livalo samples due to an abnormal lipid profile. Her total cholesterol was 213, triglycerides 122, HDL 55 and LDL 134. Over Breanna past month she has noted no adverse side effects to Breanna Livalo.  PMHx:  Past Medical History  Diagnosis Date  . Cancer     cervical    Past Surgical History  Procedure Laterality Date  . Abdominal hysterectomy  1972    cervical cancer    FAMHx:  Family History  Problem Relation Age of Onset  . Hypertension Mother     dx in her 51's    SOCHx:   reports that she quit smoking about 10 years ago. Her smoking use included Cigarettes. She smoked 0.00 packs per day for 32 years. She has never used smokeless tobacco. She reports that  drinks alcohol. She reports that she does not use illicit drugs.  ALLERGIES:  Allergies  Allergen Reactions  . Statins     Muscle cramps    ROS: A comprehensive review of systems was negative except for: Respiratory: positive for dyspnea on exertion and Sinusitis  HOME  MEDS: Current Outpatient Prescriptions  Medication Sig Dispense Refill  . cetirizine (ZYRTEC) 10 MG tablet Take 10 mg by mouth daily.      . cholecalciferol (VITAMIN D) 1000 UNITS tablet Take 2,000 Units by mouth daily.       . fluticasone (FLONASE) 50 MCG/ACT nasal spray Place 2 sprays into Breanna nose daily.  16 g  10  . lisinopril-hydrochlorothiazide (PRINZIDE,ZESTORETIC) 10-12.5 MG per tablet Take 1 tablet by mouth daily.  90 tablet  3  . Pitavastatin Calcium (LIVALO) 2 MG TABS Take 1 tablet (2 mg total) by mouth daily.  30 tablet  11   No current facility-administered medications for this visit.    LABS/IMAGING: No results found for this or any previous visit (from Breanna past 48 hour(s)). No results found.  VITALS: BP 138/82  Pulse 72  Ht 5' 4.5" (1.638 m)  Wt 165 lb (74.844 kg)  BMI 27.9 kg/m2  EXAM: deferred  EKG: deferred  ASSESSMENT: 1. Shortness of breath secondary to sinus congestion 2. Mildly abnormal metabolic testing, low risk 3. Dyslipidemia  PLAN: 1.   Breanna Sharp has a low risk cardia metabolic cast. There is a mild abnormality with increased heart rate at Breanna end of exercise period this could improve artificially with a beta blocker or with increased exercise. We talked about optimal heart rate for cardiovascular conditioning. I've encouraged her to continue with her exercise. We will go ahead and prescribe her for Breanna liver low 2 mg daily as she appears  to be tolerating it. We'll plan to recheck her lipid profile in 2-3 months. I'll see her back in a year after metabolic testing, after which we can review it.  Breanna Shutter, MD, Breanna Sharp Attending Cardiologist [facility]  Breanna Sharp C 05/02/2013, 9:21 AM

## 2013-05-02 NOTE — Patient Instructions (Addendum)
Your physician would like you to have your lab work done in 2 months.  You will need to fast, meaning nothing to eat/drink after midnight the evening prior to your lab work.   Your physician has ordered a prescription for Livalo 2mg . This has been sent to your pharmacy. We have provided samples of Livalo 4mg . Please cut these tablets in half to make your 2mg  dose.   Dr. Rennis Golden would like you have a Met Test. Please schedule this prior to your follow up in 1 year.

## 2013-05-07 ENCOUNTER — Telehealth: Payer: Self-pay | Admitting: Internal Medicine

## 2013-05-07 NOTE — Telephone Encounter (Signed)
See result note.  

## 2013-05-07 NOTE — Telephone Encounter (Signed)
Returning Amber call

## 2013-05-28 ENCOUNTER — Ambulatory Visit: Payer: 59 | Admitting: Internal Medicine

## 2013-06-04 ENCOUNTER — Ambulatory Visit: Payer: 59 | Admitting: Internal Medicine

## 2013-07-06 LAB — NMR LIPOPROFILE WITH LIPIDS
Cholesterol, Total: 205 mg/dL — ABNORMAL HIGH (ref <200)
HDL Particle Number: 37.6 umol/L (ref >=30.5)
HDL Size: 8.5 nm — ABNORMAL LOW (ref >=9.2)
HDL-C: 50 mg/dL (ref >=40)
LDL (calc): 129 mg/dL — ABNORMAL HIGH (ref <100)
LDL Particle Number: 2054 nmol/L — ABNORMAL HIGH (ref <1000)
LDL Size: 20.3 nm — ABNORMAL LOW (ref >20.5)
LP-IR Score: 81 — ABNORMAL HIGH (ref <=45)
Large HDL-P: 3.1 umol/L — ABNORMAL LOW (ref >=4.8)
Large VLDL-P: 3.6 nmol/L — ABNORMAL HIGH (ref <=2.7)
Small LDL Particle Number: 1304 nmol/L — ABNORMAL HIGH (ref <=527)
Triglycerides: 129 mg/dL (ref <150)
VLDL Size: 55.5 nm — ABNORMAL HIGH (ref <=46.6)

## 2013-07-09 ENCOUNTER — Telehealth: Payer: Self-pay | Admitting: *Deleted

## 2013-07-09 DIAGNOSIS — Z79899 Other long term (current) drug therapy: Secondary | ICD-10-CM

## 2013-07-09 DIAGNOSIS — E785 Hyperlipidemia, unspecified: Secondary | ICD-10-CM

## 2013-07-09 MED ORDER — PITAVASTATIN CALCIUM 2 MG PO TABS: 4.0000 mg | ORAL_TABLET | Freq: Every day | ORAL | Status: DC

## 2013-07-09 NOTE — Telephone Encounter (Signed)
Called patient with instructions per Dr. Rennis Golden about repeat lab work in 3 months. Lab slips mailed. Refill for increased dose of Livalo Rx was sent to pharmacy electronically.

## 2013-07-09 NOTE — Telephone Encounter (Signed)
Message copied by Lindell Spar on Wed Jul 09, 2013 10:41 AM ------      Message from: Breanna Sharp      Created: Wed Jul 09, 2013  9:23 AM       Repeat would be in 3 months. If the myalgias are worse on the higher dose, may have to stay on the 2 mg dose.            -[place] ------

## 2013-07-14 ENCOUNTER — Other Ambulatory Visit: Payer: Self-pay

## 2013-07-14 DIAGNOSIS — Z1231 Encounter for screening mammogram for malignant neoplasm of breast: Secondary | ICD-10-CM

## 2013-08-15 ENCOUNTER — Telehealth: Payer: Self-pay | Admitting: Internal Medicine

## 2013-08-15 NOTE — Telephone Encounter (Signed)
Wanted you to know she sttopped taking Livalo today because she was having cramps so bad.What can she take now?

## 2013-08-15 NOTE — Telephone Encounter (Signed)
Message forwarded to Dr. Hilty.  

## 2013-08-18 ENCOUNTER — Ambulatory Visit
Admission: RE | Admit: 2013-08-18 | Discharge: 2013-08-18 | Disposition: A | Discharge status: Home / Self Care | Payer: 59 | Source: Ambulatory Visit

## 2013-08-18 DIAGNOSIS — Z1231 Encounter for screening mammogram for malignant neoplasm of breast: Secondary | ICD-10-CM

## 2013-08-18 MED ORDER — EZETIMIBE 10 MG PO TABS: 10.0000 mg | ORAL_TABLET | Freq: Every day | ORAL | Status: DC

## 2013-08-18 NOTE — Telephone Encounter (Signed)
Called patient with medication recommendations per Dr. Rennis Golden - patient agreed with plan - zetia 10mg  QD ordered.

## 2013-08-18 NOTE — Telephone Encounter (Signed)
She could try Zetia 10 mg daily - this is not a statin. Does not cause muscle pain, but although it does lower cholesterol, by itself it has not been shown to significantly reduce cardiovascular events. FYI.  It is not an inexpensive medication - but we can provide samples. Eileen Stanford, can you Rx this for her?  -Dr. Rennis Golden

## 2013-08-20 ENCOUNTER — Ambulatory Visit (INDEPENDENT_AMBULATORY_CARE_PROVIDER_SITE_OTHER): Payer: 59 | Admitting: Internal Medicine

## 2013-08-20 VITALS — BP 130/76 | HR 72 | Temp 98.2°F | Resp 16 | Ht 64.0 in | Wt 165.0 lb

## 2013-08-20 DIAGNOSIS — I10 Essential (primary) hypertension: Secondary | ICD-10-CM

## 2013-08-20 DIAGNOSIS — Z23 Encounter for immunization: Secondary | ICD-10-CM

## 2013-08-20 DIAGNOSIS — Z0000 Encounter for general adult medical examination without abnormal findings: Secondary | ICD-10-CM

## 2013-08-20 DIAGNOSIS — J329 Chronic sinusitis, unspecified: Secondary | ICD-10-CM

## 2013-08-20 DIAGNOSIS — IMO0001 Reserved for inherently not codable concepts without codable children: Secondary | ICD-10-CM

## 2013-08-20 DIAGNOSIS — E785 Hyperlipidemia, unspecified: Secondary | ICD-10-CM

## 2013-08-20 LAB — CBC WITH DIFFERENTIAL/PLATELET
Basophils Absolute: 0 10*3/uL (ref 0.0–0.1)
Basophils Relative: 0 % (ref 0–1)
Eosinophils Absolute: 0.1 10*3/uL (ref 0.0–0.7)
Eosinophils Relative: 2 % (ref 0–5)
HCT: 36.2 % (ref 36.0–46.0)
Hemoglobin: 12.4 g/dL (ref 12.0–15.0)
Lymphocytes Relative: 40 % (ref 12–46)
Lymphs Abs: 2.3 10*3/uL (ref 0.7–4.0)
MCH: 28.8 pg (ref 26.0–34.0)
MCHC: 34.3 g/dL (ref 30.0–36.0)
MCV: 84.2 fL (ref 78.0–100.0)
Monocytes Absolute: 0.5 10*3/uL (ref 0.1–1.0)
Monocytes Relative: 9 % (ref 3–12)
Neutro Abs: 2.8 10*3/uL (ref 1.7–7.7)
Neutrophils Relative %: 49 % (ref 43–77)
Platelets: 243 10*3/uL (ref 150–400)
RBC: 4.3 MIL/uL (ref 3.87–5.11)
RDW: 13.5 % (ref 11.5–15.5)
WBC: 5.8 10*3/uL (ref 4.0–10.5)

## 2013-08-20 LAB — POCT URINALYSIS DIPSTICK
Bilirubin, UA: NEGATIVE
Blood, UA: NEGATIVE
Glucose, UA: NEGATIVE
Ketones, UA: NEGATIVE
Leukocytes, UA: NEGATIVE
Nitrite, UA: NEGATIVE
Protein, UA: NEGATIVE
Spec Grav, UA: 1.02
Urobilinogen, UA: 0.2
pH, UA: 5.5

## 2013-08-20 LAB — COMPREHENSIVE METABOLIC PANEL
ALT: 16 U/L (ref 0–35)
AST: 17 U/L (ref 0–37)
Albumin: 3.9 g/dL (ref 3.5–5.2)
Alkaline Phosphatase: 66 U/L (ref 39–117)
BUN: 13 mg/dL (ref 6–23)
CO2: 24 mEq/L (ref 19–32)
Calcium: 9.1 mg/dL (ref 8.4–10.5)
Chloride: 109 mEq/L (ref 96–112)
Creat: 0.76 mg/dL (ref 0.50–1.10)
Glucose, Bld: 115 mg/dL — ABNORMAL HIGH (ref 70–99)
Potassium: 4 mEq/L (ref 3.5–5.3)
Sodium: 141 mEq/L (ref 135–145)
Total Bilirubin: 0.5 mg/dL (ref 0.3–1.2)
Total Protein: 6.4 g/dL (ref 6.0–8.3)

## 2013-08-20 LAB — POCT GLYCOSYLATED HEMOGLOBIN (HGB A1C): Hemoglobin A1C: 6

## 2013-08-20 LAB — CK: Total CK: 116 U/L (ref 7–177)

## 2013-08-22 NOTE — Progress Notes (Signed)
Subjective:    Patient ID: Breanna Sharp, female    DOB: February 03, 1951, 62 y.o.   MRN: [id number]  HPIcpe-doing well Lots of muscle pain in thighs 6 months Patient Active Problem List   Diagnosis Date Noted  . DOE (dyspnea on exertion) 05/02/2013  . Chronic sinusitis 05/02/2013  . Vertigo 08/14/2012  . HTN (hypertension) 08/14/2012  . Hyperlipidemia----statin intol 08/14/2012  . BMI 28.0-28.9,adult--working out 4-5 x per week 08/14/2012  . COLONIC POLYPS 01/24/2008  . ACUT GASTR ULCER W/O MENTION HEMORR PERF/OBST 01/24/2008  stable//recent card eval--wnl Current outpatient prescriptions:cetirizine (ZYRTEC) 10 MG tablet, Take 10 mg by mouth daily., Disp: , Rfl: ;   cholecalciferol (VITAMIN D) 1000 UNITS tablet, Take 2,000 Units by mouth daily. , Disp: , Rfl: ;   ezetimibe (ZETIA) 10 MG tablet, Take 1 tablet (10 mg total) by mouth daily., Disp: 35 tablet, Rfl: 0;   fluticasone (FLONASE) 50 MCG/ACT nasal spray, Place 2 sprays into the nose daily., Disp: 16 g, Rfl: 10 lisinopril-hydrochlorothiazide (PRINZIDE,ZESTORETIC) 10-12.5 MG per tablet, Take 1 tablet by mouth daily., Disp: 90 tablet, Rfl: 3 ;  Pitavastatin Calcium (LIVALO) 2 MG TABS, Take 2 tablets (4 mg total) by mouth daily., Disp: 60 tablet, Rfl: 6;   Pitavastatin Calcium 2 MG TABS, Take 4 mg by mouth daily. Take 2 tablets by mouth daily., Disp: , Rfl:    Review of Systems  Constitutional: Negative for fever, activity change, appetite change, fatigue and unexpected weight change.  HENT: Negative for hearing loss, congestion, rhinorrhea, trouble swallowing, neck pain and dental problem.   Eyes: Negative for photophobia and visual disturbance.  Respiratory: Negative for cough, shortness of breath and wheezing.   Cardiovascular: Negative for chest pain, palpitations and leg swelling.  Gastrointestinal: Negative for abdominal pain, diarrhea, constipation and blood in stool.  Genitourinary: Negative for difficulty urinating,  menstrual problem, pelvic pain and dyspareunia.  Musculoskeletal: Negative for myalgias, back pain, joint swelling, arthralgias and gait problem.  Skin: Negative for rash.  Neurological: Negative for dizziness, speech difficulty and headaches.  Hematological: Negative for adenopathy. Does not bruise/bleed easily.  Psychiatric/Behavioral: Negative for behavioral problems, sleep disturbance and dysphoric mood.       Objective:   Physical Exam  Constitutional: She is oriented to person, place, and time. She appears well-developed and well-nourished. No distress.  HENT:  Head: Normocephalic.  Right Ear: External ear normal.  Left Ear: External ear normal.  Nose: Nose normal.  Mouth/Throat: Oropharynx is clear and moist.  Eyes: Conjunctivae and EOM are normal. Pupils are equal, round, and reactive to light.  Neck: Normal range of motion. Neck supple. No tracheal deviation present. No thyromegaly present.  Cardiovascular: Normal rate, regular rhythm, normal heart sounds and intact distal pulses.  Exam reveals no gallop and no friction rub.   No murmur heard. Pulmonary/Chest: Effort normal and breath sounds normal. No respiratory distress. She has no wheezes.  Abdominal: Soft. Bowel sounds are normal. There is no tenderness.  Musculoskeletal: Normal range of motion. She exhibits no edema and no tenderness.  Lymphadenopathy:    She has no cervical adenopathy.  Neurological: She is alert and oriented to person, place, and time. She has normal reflexes. No cranial nerve deficit.  Skin: No rash noted.  Psychiatric: She has a normal mood and affect. Her behavior is normal. Judgment and thought content normal.        Assessment & Plan:  Routine general medical examination at a health care facility - Plan: POCT glycosylated hemoglobin (  Hb A1C), Comprehensive metabolic panel, POCT urinalysis dipstick  Need for prophylactic vaccination with combined diphtheria-tetanus-pertussis (DTP) vaccine -  Plan: Tdap vaccine greater than or equal to 7yo IM  Need for prophylactic vaccination and inoculation against influenza - Plan: Flu Vaccine QUAD 36+ mos IM  Unspecified essential hypertension - Plan: CBC with Differential  Other and unspecified hyperlipidemia - Plan: Comprehensive metabolic panel///followed by cardiology  Myalgia and myositis - Plan: CK  HTN (hypertension)  Chronic sinusitis   Addend= Results for orders placed in visit on 08/20/13  CBC WITH DIFFERENTIAL      Result Value Range   WBC 5.8  4.0 - 10.5 K/uL   RBC 4.30  3.87 - 5.11 MIL/uL   Hemoglobin 12.4  12.0 - 15.0 g/dL   HCT 56.2  13.0 - 86.5 %   MCV 84.2  78.0 - 100.0 fL   MCH 28.8  26.0 - 34.0 pg   MCHC 34.3  30.0 - 36.0 g/dL   RDW 78.4  69.6 - 29.5 %   Platelets 243  150 - 400 K/uL   Neutrophils Relative % 49  43 - 77 %   Neutro Abs 2.8  1.7 - 7.7 K/uL   Lymphocytes Relative 40  12 - 46 %   Lymphs Abs 2.3  0.7 - 4.0 K/uL   Monocytes Relative 9  3 - 12 %   Monocytes Absolute 0.5  0.1 - 1.0 K/uL   Eosinophils Relative 2  0 - 5 %   Eosinophils Absolute 0.1  0.0 - 0.7 K/uL   Basophils Relative 0  0 - 1 %   Basophils Absolute 0.0  0.0 - 0.1 K/uL   Smear Review Criteria for review not met    COMPREHENSIVE METABOLIC PANEL      Result Value Range   Sodium 141  135 - 145 mEq/L   Potassium 4.0  3.5 - 5.3 mEq/L   Chloride 109  96 - 112 mEq/L   CO2 24  19 - 32 mEq/L   Glucose, Bld 115 (*) 70 - 99 mg/dL   BUN 13  6 - 23 mg/dL   Creat 2.84  1.32 - 4.40 mg/dL   Total Bilirubin 0.5  0.3 - 1.2 mg/dL   Alkaline Phosphatase 66  39 - 117 U/L   AST 17  0 - 37 U/L   ALT 16  0 - 35 U/L   Total Protein 6.4  6.0 - 8.3 g/dL   Albumin 3.9  3.5 - 5.2 g/dL   Calcium 9.1  8.4 - 10.2 mg/dL  CK      Result Value Range   Total CK 116  7 - 177 U/L  POCT GLYCOSYLATED HEMOGLOBIN (HGB A1C)      Result Value Range   Hemoglobin A1C 6.0    POCT URINALYSIS DIPSTICK      Result Value Range   Color, UA yellow     Clarity, UA  clear     Glucose, UA neg     Bilirubin, UA neg     Ketones, UA neg     Spec Grav, UA 1.020     Blood, UA neg     pH, UA 5.5     Protein, UA neg     Urobilinogen, UA 0.2     Nitrite, UA neg     Leukocytes, UA Negative      Call if needs refills

## 2013-08-26 ENCOUNTER — Telehealth: Payer: Self-pay

## 2013-08-26 NOTE — Telephone Encounter (Signed)
PATIENT SAYS WE CALLED HER TODAY AND SHE IS CALLING BACK PLEASE CALL HER AT [phone]

## 2013-08-27 ENCOUNTER — Encounter: Payer: Self-pay | Admitting: Internal Medicine

## 2013-08-27 NOTE — Telephone Encounter (Signed)
I did not call her, I am unsure who did, left message to

## 2013-09-06 ENCOUNTER — Other Ambulatory Visit: Payer: Self-pay | Admitting: Internal Medicine

## 2013-09-08 ENCOUNTER — Telehealth: Payer: Self-pay

## 2013-09-08 MED ORDER — LISINOPRIL-HYDROCHLOROTHIAZIDE 10-12.5 MG PO TABS: 1.0000 | ORAL_TABLET | Freq: Every day | ORAL | Status: DC

## 2013-09-08 MED ORDER — FLUTICASONE PROPIONATE 50 MCG/ACT NA SUSP: 2.0000 | Freq: Every day | NASAL | Status: DC

## 2013-09-08 NOTE — Telephone Encounter (Signed)
PT STATES WE WERE SUPPOSE TO CALL IN HER FLONASE AND LISINOPRIL AND HASN'T. PLEASE CALL [phone]   [facility]

## 2013-09-08 NOTE — Telephone Encounter (Signed)
Sent in Lisinopril , pended the Flonase.

## 2013-09-08 NOTE — Telephone Encounter (Signed)
Meds ordered this encounter  Medications  . lisinopril-hydrochlorothiazide (PRINZIDE,ZESTORETIC) 10-12.5 MG per tablet    Sig: Take 1 tablet by mouth daily.    Dispense:  90 tablet    Refill:  1  . fluticasone (FLONASE) 50 MCG/ACT nasal spray    Sig: Place 2 sprays into the nose daily.    Dispense:  16 g    Refill:  10

## 2013-10-04 ENCOUNTER — Ambulatory Visit (INDEPENDENT_AMBULATORY_CARE_PROVIDER_SITE_OTHER): Payer: 59 | Admitting: Family Medicine

## 2013-10-04 VITALS — BP 126/66 | HR 76 | Temp 98.1°F | Resp 16 | Ht 64.0 in | Wt 162.2 lb

## 2013-10-04 DIAGNOSIS — J329 Chronic sinusitis, unspecified: Secondary | ICD-10-CM

## 2013-10-04 MED ORDER — FEXOFENADINE HCL 180 MG PO TABS: 180.0000 mg | ORAL_TABLET | Freq: Every day | ORAL | Status: DC

## 2013-10-04 MED ORDER — AZITHROMYCIN 250 MG PO TABS: ORAL_TABLET | ORAL | Status: DC

## 2013-10-04 MED ORDER — HYDROCODONE-HOMATROPINE 5-1.5 MG/5ML PO SYRP: 5.0000 mL | ORAL_SOLUTION | Freq: Three times a day (TID) | ORAL | Status: DC | PRN

## 2013-10-04 MED ORDER — FLUTICASONE PROPIONATE 50 MCG/ACT NA SUSP: 2.0000 | Freq: Two times a day (BID) | NASAL | Status: DC

## 2013-10-04 NOTE — Patient Instructions (Signed)

## 2013-10-04 NOTE — Progress Notes (Signed)
Subjective:    Patient ID: Breanna Sharp, female    DOB: 01-18-1951, 62 y.o.   MRN: [id number]  This chart was scribed for Elvina Sidle, MD by Greggory Stallion, Medical Scribe. This patient's care was started at 8:06 AM.  HPI HPI Comments: Breanna Sharp is a 61 y.o. retired female who presents to the office complaining of cough, post nasal drip and congestion due to allergies that started one week ago. Pt states this normally happens when the seasons change and last about 3 weeks each time. She denies fever, abdominal pain, nausea, emesis, diarrhea. Pt has never used an inhaler for her allergies but she has been using Flonase with no relief. Denies history of smoking cigarettes.   Patient Active Problem List   Diagnosis Date Noted  . DOE (dyspnea on exertion) 05/02/2013  . Chronic sinusitis 05/02/2013  . Vertigo 08/14/2012  . HTN (hypertension) 08/14/2012  . hyperlipidemia 08/14/2012  . BMI 28.0-28.9,adult 08/14/2012  . COLONIC POLYPS 01/24/2008  . ACUT GASTR ULCER W/O MENTION HEMORR PERF/OBST 01/24/2008   Past Medical History  Diagnosis Date  . Cancer     cervical  . Hypertension    Past Surgical History  Procedure Laterality Date  . Abdominal hysterectomy  1972    cervical cancer   Allergies  Allergen Reactions  . Statins     Muscle cramps   Prior to Admission medications   Medication Sig Start Date End Date Taking? Authorizing Provider  cetirizine (ZYRTEC) 10 MG tablet Take 10 mg by mouth daily.   Yes Historical Provider, MD  cholecalciferol (VITAMIN D) 1000 UNITS tablet Take 2,000 Units by mouth daily.    Yes Historical Provider, MD  ezetimibe (ZETIA) 10 MG tablet Take 1 tablet (10 mg total) by mouth daily. 08/18/13  Yes Chrystie Nose, MD  fluticasone (FLONASE) 50 MCG/ACT nasal spray Place 2 sprays into the nose daily. 09/08/13  Yes Chelle S Jeffery, PA-C  lisinopril-hydrochlorothiazide (PRINZIDE,ZESTORETIC) 10-12.5 MG per tablet Take 1 tablet by mouth daily.  09/08/13  Yes Godfrey Pick, PA-C   History   Social History  . Marital Status: Married    Spouse Name: N/A    Number of Children: N/A  . Years of Education: N/A   Occupational History  . Not on file.   Social History Main Topics  . Smoking status: Former Smoker -- 32 years    Types: Cigarettes    Quit date: 11/27/2002  . Smokeless tobacco: Never Used  . Alcohol Use: 0.0 oz/week     Comment: social - 2 or 3 times a month 1-2 glasses of wine  . Drug Use: No  . Sexual Activity: Yes    Partners: Male   Other Topics Concern  . Not on file   Social History Narrative   Exercise 3 to 4 times/week walking for 45 min -1 hour    Review of Systems  Constitutional: Negative for fever.  HENT: Positive for congestion and postnasal drip.   Respiratory: Positive for cough.   Gastrointestinal: Negative for nausea, vomiting, abdominal pain and diarrhea.      Objective:   Physical Exam  Constitutional: She is oriented to person, place, and time. She appears well-developed and well-nourished. No distress.  HENT:  Head: Normocephalic and atraumatic.  Right Ear: Tympanic membrane and ear canal normal.  Left Ear: Tympanic membrane and ear canal normal.  Mouth/Throat: Uvula is midline, oropharynx is clear and moist and mucous membranes are normal.  Eyes: EOM are normal.  Neck: Neck supple.  Cardiovascular: Normal rate, regular rhythm and normal heart sounds.   Pulmonary/Chest: Effort normal and breath sounds normal. No respiratory distress. She has no wheezes. She has no rales.  Musculoskeletal: Normal range of motion.  Neurological: She is alert and oriented to person, place, and time.  Skin: Skin is warm and dry.  Psychiatric: She has a normal mood and affect. Her behavior is normal.   Filed Vitals:   10/04/13 0752  BP: 126/66  Pulse: 76  Temp: 98.1 F (36.7 C)  TempSrc: Oral  Resp: 16  Height: 5\' 4"  (1.626 m)  Weight: 162 lb 3.2 oz (73.573 kg)  SpO2: 98%   nasal  passages are pale blue with mild mucopurulent discharge    Assessment & Plan:  Sinusitis - Plan: azithromycin (ZITHROMAX Z-PAK) 250 MG tablet, HYDROcodone-homatropine (HYCODAN) 5-1.5 MG/5ML syrup, fexofenadine (ALLEGRA) 180 MG tablet, fluticasone (FLONASE) 50 MCG/ACT nasal spray  Signed, Elvina Sidle, MD

## 2013-10-15 ENCOUNTER — Other Ambulatory Visit: Payer: Self-pay | Admitting: Internal Medicine

## 2013-11-24 LAB — NMR LIPOPROFILE WITH LIPIDS
Cholesterol, Total: 280 mg/dL — ABNORMAL HIGH (ref <200)
HDL Particle Number: 44.9 umol/L (ref >=30.5)
HDL Size: 8.7 nm — ABNORMAL LOW (ref >=9.2)
HDL-C: 60 mg/dL (ref >=40)
LDL (calc): 190 mg/dL — ABNORMAL HIGH (ref <100)
LDL Particle Number: 3189 nmol/L — ABNORMAL HIGH (ref <1000)
LDL Size: 20.1 nm — ABNORMAL LOW (ref >20.5)
LP-IR Score: 72 — ABNORMAL HIGH (ref <=45)
Large HDL-P: 6 umol/L (ref >=4.8)
Large VLDL-P: 6.4 nmol/L — ABNORMAL HIGH (ref <=2.7)
Small LDL Particle Number: 2112 nmol/L — ABNORMAL HIGH (ref <=527)
Triglycerides: 148 mg/dL (ref <150)
VLDL Size: 52.3 nm — ABNORMAL HIGH (ref <=46.6)

## 2013-11-28 ENCOUNTER — Encounter: Payer: Self-pay | Admitting: *Deleted

## 2013-11-28 ENCOUNTER — Telehealth: Payer: Self-pay | Admitting: *Deleted

## 2013-12-02 ENCOUNTER — Encounter: Payer: Self-pay | Admitting: *Deleted

## 2013-12-03 ENCOUNTER — Ambulatory Visit (INDEPENDENT_AMBULATORY_CARE_PROVIDER_SITE_OTHER): Payer: 59 | Admitting: Internal Medicine

## 2013-12-03 ENCOUNTER — Encounter: Payer: Self-pay | Admitting: Internal Medicine

## 2013-12-03 VITALS — BP 110/60 | HR 76 | Ht 64.5 in | Wt 166.4 lb

## 2013-12-03 DIAGNOSIS — E785 Hyperlipidemia, unspecified: Secondary | ICD-10-CM

## 2013-12-03 DIAGNOSIS — I10 Essential (primary) hypertension: Secondary | ICD-10-CM

## 2013-12-03 DIAGNOSIS — Z6828 Body mass index (BMI) 28.0-28.9, adult: Secondary | ICD-10-CM

## 2013-12-03 MED ORDER — PITAVASTATIN CALCIUM 4 MG PO TABS: 4.0000 mg | ORAL_TABLET | Freq: Every day | ORAL | Status: DC

## 2013-12-03 MED ORDER — EZETIMIBE 10 MG PO TABS: 10.0000 mg | ORAL_TABLET | Freq: Every day | ORAL | Status: DC

## 2013-12-03 NOTE — Patient Instructions (Signed)
Please have fasting blood work in 3 months.  Dr Debara Pickett would like you to follow up approx. June 2015

## 2013-12-03 NOTE — Progress Notes (Signed)
OFFICE NOTE  Chief Complaint:  Followup test  Primary Care Physician: Leandrew Koyanagi, MD  HPI:  Breanna Sharp is a pleasant 63 year old female with a history of hypertension and dyslipidemia. Unfortunately she's been intolerant to statins in the past and has failed both Lipitor Zocor Crestor and pravastatin. She is reported some increasing shortness of breath mostly when walking upstairs, however has been struggling with recurrent sinusitis and upper respiratory symptoms. Based on her risk factors, recommended metabolic testing. She underwent cardiopulmonary testing on 04/22/2013. She had maximal effort of 1.08 our ER. Peak VO2 was 108% predicted. Heart rate was 94% predicted. Her heart rate in view to curves were essentially normal with late flattening of her view to curve. Overall the study is low risk. She also went underwent palmar he function testing which showed normal diffusion, volume and flow loops. At her last visit she started taking Livalo samples due to an abnormal lipid profile. Her total cholesterol was 213, triglycerides 122, HDL 55 and LDL 134. Over the intial month on Livalo, she seemed to tolerate it.   Then she reported some right flank pain, especially when lifting weights, which he had started to do at the same time. She stopped her cholesterol medicine and stopped her exercise and her symptoms improved. She then restarted her exercise and had symptoms and realized that it was not likely the cholesterol medicine that was causing her right side pain. Her repeat cholesterol profile was abnormally high on only Zetia monotherapy, as she discontinued Livalo due to cramps. Her LDL particle number was 3189, LDL content was 190, HDL C. was 60 and triglycerides 148.    PMHx:  Past Medical History  Diagnosis Date  . Cervical cancer   . Hypertension   . Dyslipidemia     statin intolerance    Past Surgical History  Procedure Laterality Date  . Abdominal hysterectomy  1972     cervical cancer    FAMHx:  Family History  Problem Relation Age of Onset  . Hypertension Mother     dx in her 79's  . Heart attack Mother 63  . Heart attack Brother 64  . Diabetes Brother     SOCHx:   reports that she quit smoking about 11 years ago. Her smoking use included Cigarettes. She smoked 0.00 packs per day for 32 years. She has never used smokeless tobacco. She reports that she drinks alcohol. She reports that she does not use illicit drugs.  ALLERGIES:  Allergies  Allergen Reactions  . Statins     Muscle cramps    ROS: A comprehensive review of systems was negative except for: Respiratory: positive for dyspnea on exertion and Sinusitis  HOME MEDS: Current Outpatient Prescriptions  Medication Sig Dispense Refill  . cholecalciferol (VITAMIN D) 1000 UNITS tablet Take 2,000 Units by mouth daily.       [name] ezetimibe (ZETIA) 10 MG tablet Take 1 tablet (10 mg total) by mouth daily.  28 tablet  0  . fexofenadine (ALLEGRA) 180 MG tablet Take 1 tablet (180 mg total) by mouth daily.  90 tablet  3  . fluticasone (FLONASE) 50 MCG/ACT nasal spray Place 2 sprays into both nostrils 2 (two) times daily.  16 g  10  . lisinopril-hydrochlorothiazide (PRINZIDE,ZESTORETIC) 10-12.5 MG per tablet Take 1 tablet by mouth daily.  90 tablet  1  . Pitavastatin Calcium 4 MG TABS Take 1 tablet (4 mg total) by mouth daily.  28 tablet  0   No  current facility-administered medications for this visit.    LABS/IMAGING: No results found for this or any previous visit (from the past 48 hour(s)). No results found.  VITALS: BP 110/60  Pulse 76  Ht 5' 4.5" (1.638 m)  Wt 166 lb 6.4 oz (75.479 kg)  BMI 28.13 kg/m2  EXAM: deferred  EKG: deferred  ASSESSMENT: 1. Dyslipidemia  PLAN: 1.   Mrs. Timoney has a markedly elevated cholesterol profile and clearly benefits from a statin. While on Livalo, her cholesterol was reduced from 8 and LDL of 190-129 and a particle number from 3189 to  2054.  Based on these numbers, I would recommend that she restarts Livalo 2 mg daily and continues on Zetia. I would recommend rechecking her lipid profile in 3 months.   Lyman Bishop, MD, [facility] Attending Cardiologist [facility] 12/03/2013, 6:16 PM

## 2013-12-04 ENCOUNTER — Encounter: Payer: Self-pay | Admitting: Internal Medicine

## 2014-01-29 ENCOUNTER — Telehealth: Payer: Self-pay | Admitting: Internal Medicine

## 2014-01-29 NOTE — Telephone Encounter (Signed)
Thinks that the medication (Livalo 4mg  and Zeita 10mg  ) is messing with her muscles and would like to speak to someone about it .[name] Please Call    Thanks

## 2014-01-29 NOTE — Telephone Encounter (Signed)
Returned call and pt verified x 2.  Pt stated the medication she is on is bothering.  Stated the medication is Livalo.  Pt c/o muscle cramps and pains in her stomach.  C/o having "knots" in her stomach when exercising.  Stated symptoms started last week and she didn't really pay it much attention, but today it got bad.  RN asked pt what dose is she taking b/c message states 4 mg and OV note states pt to restart at 2 mg.  Pt stated she is taking 4 mg b/c the samples and prescription she received state to take 4 mg.  Pt informed Dr. Debara Pickett specifically documented she should restart at 2 mg daily.  Pt advised to decrease dose to 2 mg x 2 weeks and if still w/ cramps to call the office.  Informed Dr. Debara Pickett will be notified in the meantime in case he has further instructions.  Pt verbalized understanding and agreed w/ plan.  Message forwarded to Dr. Debara Pickett.

## 2014-01-30 NOTE — Telephone Encounter (Signed)
Yes .. Have her try the 2 mg dose and see if it is better for 2 weeks. If symptoms persist, she can discontinue it.  Dr. Debara Pickett

## 2014-01-30 NOTE — Telephone Encounter (Signed)
Call to mobile and left message w/o pt-identifying info that MD agrees to try 2 mg dose x 2 weeks and if symptoms persist to discontinue it.  Call back today before 4pm with questions.

## 2014-03-19 LAB — NMR LIPOPROFILE WITH LIPIDS
Cholesterol, Total: 184 mg/dL (ref <200)
HDL Particle Number: 37.4 umol/L (ref >=30.5)
HDL Size: 9.2 nm (ref >=9.2)
HDL-C: 51 mg/dL (ref >=40)
LDL (calc): 105 mg/dL — ABNORMAL HIGH (ref <100)
LDL Particle Number: 1594 nmol/L — ABNORMAL HIGH (ref <1000)
LDL Size: 19.9 nm — ABNORMAL LOW (ref >20.5)
LP-IR Score: 57 — ABNORMAL HIGH (ref <=45)
Large HDL-P: 5.9 umol/L (ref >=4.8)
Large VLDL-P: 3.8 nmol/L — ABNORMAL HIGH (ref <=2.7)
Small LDL Particle Number: 1145 nmol/L — ABNORMAL HIGH (ref <=527)
Triglycerides: 142 mg/dL (ref <150)
VLDL Size: 48 nm — ABNORMAL HIGH (ref <=46.6)

## 2014-03-20 NOTE — Telephone Encounter (Signed)
Encounter closed--03/20/13 tp 

## 2014-03-26 ENCOUNTER — Encounter: Payer: Self-pay | Admitting: *Deleted

## 2014-04-08 ENCOUNTER — Encounter (INDEPENDENT_AMBULATORY_CARE_PROVIDER_SITE_OTHER): Payer: Self-pay

## 2014-04-08 ENCOUNTER — Ambulatory Visit (INDEPENDENT_AMBULATORY_CARE_PROVIDER_SITE_OTHER): Payer: 59 | Admitting: Neurology

## 2014-04-08 ENCOUNTER — Encounter: Payer: Self-pay | Admitting: Neurology

## 2014-04-08 VITALS — BP 109/71 | HR 75 | Ht 65.0 in | Wt 160.0 lb

## 2014-04-08 DIAGNOSIS — R42 Dizziness and giddiness: Secondary | ICD-10-CM

## 2014-04-08 NOTE — Progress Notes (Signed)
HPI: Patient returns for followup after last visit 03/2013 with Hoyle Sauer.  She has been followed in our office since 2010 for acute onset of vertigo. CT of the brain without contrast was normal. She was evaluated by the hearing clinic in July 2011, and at [facility], abnormal calorics revealed a 30% left unilateral weakness consistent with static vestibulopathy. Her hypertensive medicines were changed at that time with improvement in her symptoms. She returns today stating that her last episode of vertigo was in September of last year. She continues to do her vestibular exercises daily. She also walks for exercise and goes to the [facility]. She has no new neurologic complaints  She is overall doing very well, there was only 3 transient episodes of dizziness, since last visit, no hearing loss, no gait change, she has been exercise regularly,  ROS:  Dizziness   PHYSICAL EXAMINATOINS:  Generalized: In no acute distress  Neck: Supple, no carotid bruits   Cardiac: Regular rate rhythm  Pulmonary: Clear to auscultation bilaterally  Musculoskeletal: No deformity  Neurological examination  Mentation: Alert oriented to time, place, history taking, and causual conversation  Cranial nerve II-XII: Pupils were equal round reactive to light extraocular movements were full, visual field were full on confrontational test.  Bilateral fundi were sharp  Facial sensation and strength were normal. hearing was intact to finger rubbing bilaterally. Uvula tongue midline.  head turning and shoulder shrug and were normal and symmetric.Tongue protrusion into cheek strength was normal.  Motor: normal tone, bulk and strength.  Sensory: Intact to fine touch, pinprick, preserved vibratory sensation, and proprioception at toes.  Coordination: Normal finger to nose, heel-to-shin bilaterally there was no truncal ataxia  Gait: Rising up from seated position without assistance, normal stance, without trunk ataxia, moderate  stride, good arm swing, smooth turning, able to perform tiptoe, and heel walking without difficulty.   Romberg signs: Negative  Deep tendon reflexes: Brachioradialis 2/2, biceps 2/2, triceps 2/2, patellar 2/2, Achilles 2/2, plantar responses were flexor bilaterally.  ASSESSMENT and Plan: History of vertigo, static vestibulopathy with 30% left unilateral weakness with bithermal calorics Over all doing well, only return if needed.

## 2014-04-20 ENCOUNTER — Other Ambulatory Visit: Payer: Self-pay | Admitting: Physician Assistant

## 2014-04-28 ENCOUNTER — Ambulatory Visit (INDEPENDENT_AMBULATORY_CARE_PROVIDER_SITE_OTHER): Payer: 59 | Admitting: Internal Medicine

## 2014-04-28 ENCOUNTER — Encounter: Payer: Self-pay | Admitting: Internal Medicine

## 2014-04-28 VITALS — BP 152/88 | HR 70 | Ht 64.0 in | Wt 163.2 lb

## 2014-04-28 DIAGNOSIS — E785 Hyperlipidemia, unspecified: Secondary | ICD-10-CM

## 2014-04-28 DIAGNOSIS — I10 Essential (primary) hypertension: Secondary | ICD-10-CM

## 2014-04-28 MED ORDER — EZETIMIBE 10 MG PO TABS: 10.0000 mg | ORAL_TABLET | Freq: Every day | ORAL | Status: DC

## 2014-04-28 MED ORDER — PITAVASTATIN CALCIUM 2 MG PO TABS: 1.0000 | ORAL_TABLET | Freq: Every day | ORAL | Status: DC

## 2014-04-28 MED ORDER — LISINOPRIL-HYDROCHLOROTHIAZIDE 10-12.5 MG PO TABS: 1.0000 | ORAL_TABLET | Freq: Every day | ORAL | Status: DC

## 2014-04-28 NOTE — Progress Notes (Signed)
OFFICE NOTE  Chief Complaint:  No complaints  Primary Care Physician: Leandrew Koyanagi, MD  HPI:  Breanna Sharp is a pleasant 63 year old female with a history of hypertension and dyslipidemia. Unfortunately she's been intolerant to statins in the past and has failed both Lipitor Zocor Crestor and pravastatin. She is reported some increasing shortness of breath mostly when walking upstairs, however has been struggling with recurrent sinusitis and upper respiratory symptoms. Based on her risk factors, recommended metabolic testing. She underwent cardiopulmonary testing on 04/22/2013. She had maximal effort of 1.08 our ER. Peak VO2 was 108% predicted. Heart rate was 94% predicted. Her heart rate in view to curves were essentially normal with late flattening of her view to curve. Overall the study is low risk. She also went underwent palmar he function testing which showed normal diffusion, volume and flow loops. At her last visit she started taking Livalo samples due to an abnormal lipid profile. Her total cholesterol was 213, triglycerides 122, HDL 55 and LDL 134. Over the intial month on Livalo, she seemed to tolerate it.   Then she reported some right flank pain, especially when lifting weights, which he had started to do at the same time. She stopped her cholesterol medicine and stopped her exercise and her symptoms improved. She then restarted her exercise and had symptoms and realized that it was not likely the cholesterol medicine that was causing her right side pain. Her repeat cholesterol profile was abnormally high on only Zetia monotherapy, as she discontinued Livalo due to cramps. Her LDL particle number was 3189, LDL content was 190, HDL C. was 60 and triglycerides 148.    Breanna Sharp returns today for followup of her lipid profile. Her lipid profile in April was markedly improved with an LDL particle #1594, LDL content 105, HDL 51 and triglycerides 142. She reports some  improvement in her shortness of breath and has managed to work on exercise and has had 12 pounds of weight loss.  PMHx:  Past Medical History  Diagnosis Date  . Cervical cancer   . Hypertension   . Dyslipidemia     statin intolerance  . Vertigo     Past Surgical History  Procedure Laterality Date  . Abdominal hysterectomy  1972    cervical cancer    FAMHx:  Family History  Problem Relation Age of Onset  . Hypertension Mother     dx in her 27's  . Heart attack Mother 53  . Heart attack Brother 15  . Diabetes Brother     SOCHx:   reports that she quit smoking about 11 years ago. Her smoking use included Cigarettes. She smoked 0.00 packs per day for 32 years. She has never used smokeless tobacco. She reports that she drinks alcohol. She reports that she does not use illicit drugs.  ALLERGIES:  Allergies  Allergen Reactions  . Statins     Muscle cramps    ROS: A comprehensive review of systems was negative.  HOME MEDS: Current Outpatient Prescriptions  Medication Sig Dispense Refill  . cholecalciferol (VITAMIN D) 1000 UNITS tablet Take 2,000 Units by mouth daily.       [name] ezetimibe (ZETIA) 10 MG tablet Take 1 tablet (10 mg total) by mouth daily.  30 tablet  11  . fexofenadine (ALLEGRA) 180 MG tablet Take 1 tablet (180 mg total) by mouth daily.  90 tablet  3  . fluticasone (FLONASE) 50 MCG/ACT nasal spray Place 2 sprays into both nostrils 2 (two)  times daily.  16 g  10  . lisinopril-hydrochlorothiazide (PRINZIDE,ZESTORETIC) 10-12.5 MG per tablet Take 1 tablet by mouth daily.  30 tablet  11  . Pitavastatin Calcium (LIVALO) 2 MG TABS Take 1 tablet (2 mg total) by mouth daily.  30 tablet  11   No current facility-administered medications for this visit.    LABS/IMAGING: No results found for this or any previous visit (from the past 48 hour(s)). No results found.  VITALS: BP 152/88  Pulse 70  Ht 5\' 4"  (1.626 m)  Wt 163 lb 3.2 oz (74.027 kg)  BMI 28.00  kg/m2  EXAM: General appearance: alert and no distress Neck: no carotid bruit and no JVD Lungs: clear to auscultation bilaterally Heart: regular rate and rhythm, S1, S2 normal, no murmur, click, rub or gallop Abdomen: soft, non-tender; bowel sounds normal; no masses,  no organomegaly Extremities: extremities normal, atraumatic, no cyanosis or edema Pulses: 2+ and symmetric Skin: Skin color, texture, turgor normal. No rashes or lesions Neurologic: Grossly normal  EKG: Sinus rhythm with PVC's  ASSESSMENT: 1. Dyslipidemia 2. Hypertension  PLAN: 1.   Breanna Sharp has had a big improvement in her lipid profile and a combination of Livalo and Zetia. She was having problems on the 4 mg dose but is tolerating the 2 mg dose a little low in addition with Zetia. Her cholesterol profile is markedly improved. She reports improvement in her symptoms as well as a decrease in her shortness of breath and improved exercise tolerance. She continues exercise and has lost 12 pounds which I think is making a big difference as well. I've encouraged her to continue her with her current medications. Her blood pressure was mildly elevated today however recently has been low. I will not make changes to her hypertension medications at this time but would recommend that she keep track of her blood pressures at home and contact the office with those results. If it's persistently greater than 416 systolic I would recommend an increase in her medication.  Plan to see her back annually or sooner as necessary.  Lyman Bishop, MD, [facility] Attending Cardiologist [facility] 04/28/2014, 11:06 AM

## 2014-04-28 NOTE — Patient Instructions (Signed)
Your physician wants you to follow-up in: 1 year. You will receive a reminder letter in the mail two months in advance. If you don't receive a letter, please call our office to schedule the follow-up appointment.  

## 2014-06-10 ENCOUNTER — Ambulatory Visit (INDEPENDENT_AMBULATORY_CARE_PROVIDER_SITE_OTHER): Payer: 59 | Admitting: Family Medicine

## 2014-06-10 VITALS — BP 120/72 | HR 80 | Temp 97.6°F | Resp 18 | Ht 64.0 in | Wt 157.6 lb

## 2014-06-10 DIAGNOSIS — H6123 Impacted cerumen, bilateral: Secondary | ICD-10-CM

## 2014-06-10 DIAGNOSIS — H6120 Impacted cerumen, unspecified ear: Secondary | ICD-10-CM

## 2014-06-10 NOTE — Progress Notes (Signed)
Subjective:    Patient ID: Breanna Sharp, female    DOB: [birth year], 63 y.o.   MRN: [id number] Chief Complaint  Patient presents with  . Ear Fullness    x 1 week   HPI  Left ear stuffed up x 1 wk - using wax removal drops but no discharge, not manipulating ear otherwise. No other otc meds. No f/c, has had sinus cong while in office only.  Mild hearing loss but no tinnitus.  Past Medical History  Diagnosis Date  . Cervical cancer   . Hypertension   . Dyslipidemia     statin intolerance  . Vertigo    Current Outpatient Prescriptions on File Prior to Visit  Medication Sig Dispense Refill  . cholecalciferol (VITAMIN D) 1000 UNITS tablet Take 2,000 Units by mouth daily.       [name] ezetimibe (ZETIA) 10 MG tablet Take 1 tablet (10 mg total) by mouth daily.  30 tablet  11  . fexofenadine (ALLEGRA) 180 MG tablet Take 1 tablet (180 mg total) by mouth daily.  90 tablet  3  . fluticasone (FLONASE) 50 MCG/ACT nasal spray Place 2 sprays into both nostrils 2 (two) times daily.  16 g  10  . lisinopril-hydrochlorothiazide (PRINZIDE,ZESTORETIC) 10-12.5 MG per tablet Take 1 tablet by mouth daily.  30 tablet  11   No current facility-administered medications on file prior to visit.   Allergies  Allergen Reactions  . Statins     Muscle cramps     Review of Systems    BP 120/72  Pulse 80  Temp(Src) 97.6 F (36.4 C) (Oral)  Resp 18  Ht 5\' 4"  (1.626 m)  Wt 157 lb 9.6 oz (71.487 kg)  BMI 27.04 kg/m2  SpO2 98% Objective:   Physical Exam  Constitutional: She is oriented to person, place, and time. She appears well-developed and well-nourished. No distress.  HENT:  Head: Normocephalic and atraumatic.  Right Ear: External ear normal. No tenderness. Tympanic membrane is erythematous. A middle ear effusion is present.  Left Ear: Tympanic membrane, external ear and ear canal normal.  Nose: Nose normal. No mucosal edema or rhinorrhea.  Mouth/Throat: Uvula is midline, oropharynx is clear and  moist and mucous membranes are normal. No oropharyngeal exudate.  White casing still in ears after colace and lavage. Spent extensive time removing it myself with alligator forceps and curette with still some present. Erythema on distal canal at 9 o'clock and erythema along superior aspect of Rt TM.  Eyes: Conjunctivae are normal. Right eye exhibits no discharge. Left eye exhibits no discharge. No scleral icterus.  Neck: Normal range of motion. Neck supple.  Cardiovascular: Normal rate.   Pulmonary/Chest: Effort normal.  Lymphadenopathy:    She has no cervical adenopathy.  Neurological: She is alert and oriented to person, place, and time.  Skin: Skin is warm and dry. She is not diaphoretic. No erythema.  Psychiatric: She has a normal mood and affect. Her behavior is normal.      Assessment & Plan:   Cerumen impaction, bilateral Removed by CMA Callie and myself - advised hydrogen peroxide to Rt ear but RTC for recheck if feels full or muffled.  Try sudafed, heat, humidity to relief Rt mid ear effusion but will need to be rechecked to ensure she hasn't developed AOM if sxs progress. If cerumen recurs - consider trying curette or lavage W/O colace gtts prior so that perhaps casing can be removed in a chunk rather than piecemeal. Meds ordered this  encounter  Medications  . Pitavastatin Calcium (LIVALO) 2 MG TABS    Sig: Take 2 mg by mouth daily.    Delman Cheadle, MD MPH

## 2014-07-20 ENCOUNTER — Other Ambulatory Visit: Payer: Self-pay

## 2014-07-20 DIAGNOSIS — Z1231 Encounter for screening mammogram for malignant neoplasm of breast: Secondary | ICD-10-CM

## 2014-08-20 ENCOUNTER — Encounter (INDEPENDENT_AMBULATORY_CARE_PROVIDER_SITE_OTHER): Payer: Self-pay

## 2014-08-20 ENCOUNTER — Ambulatory Visit
Admission: RE | Admit: 2014-08-20 | Discharge: 2014-08-20 | Disposition: A | Discharge status: Home / Self Care | Payer: 59 | Source: Ambulatory Visit

## 2014-08-20 DIAGNOSIS — Z1231 Encounter for screening mammogram for malignant neoplasm of breast: Secondary | ICD-10-CM

## 2014-08-26 ENCOUNTER — Ambulatory Visit (INDEPENDENT_AMBULATORY_CARE_PROVIDER_SITE_OTHER): Payer: 59 | Admitting: Internal Medicine

## 2014-08-26 ENCOUNTER — Encounter: Payer: Self-pay | Admitting: Internal Medicine

## 2014-08-26 VITALS — BP 128/72 | HR 78 | Temp 98.2°F | Resp 16 | Ht 64.0 in | Wt 157.4 lb

## 2014-08-26 DIAGNOSIS — E785 Hyperlipidemia, unspecified: Secondary | ICD-10-CM

## 2014-08-26 DIAGNOSIS — Z780 Asymptomatic menopausal state: Secondary | ICD-10-CM

## 2014-08-26 DIAGNOSIS — Z0000 Encounter for general adult medical examination without abnormal findings: Secondary | ICD-10-CM

## 2014-08-26 DIAGNOSIS — Z23 Encounter for immunization: Secondary | ICD-10-CM

## 2014-08-26 DIAGNOSIS — IMO0001 Reserved for inherently not codable concepts without codable children: Secondary | ICD-10-CM

## 2014-08-26 DIAGNOSIS — I10 Essential (primary) hypertension: Secondary | ICD-10-CM

## 2014-08-26 DIAGNOSIS — R42 Dizziness and giddiness: Secondary | ICD-10-CM

## 2014-08-26 DIAGNOSIS — Z1159 Encounter for screening for other viral diseases: Secondary | ICD-10-CM

## 2014-08-26 LAB — CBC WITH DIFFERENTIAL/PLATELET
Basophils Absolute: 0 10*3/uL (ref 0.0–0.1)
Basophils Relative: 0 % (ref 0–1)
Eosinophils Absolute: 0.1 10*3/uL (ref 0.0–0.7)
Eosinophils Relative: 3 % (ref 0–5)
HCT: 37.4 % (ref 36.0–46.0)
Hemoglobin: 12.2 g/dL (ref 12.0–15.0)
Lymphocytes Relative: 44 % (ref 12–46)
Lymphs Abs: 2.1 10*3/uL (ref 0.7–4.0)
MCH: 27.7 pg (ref 26.0–34.0)
MCHC: 32.6 g/dL (ref 30.0–36.0)
MCV: 84.8 fL (ref 78.0–100.0)
Monocytes Absolute: 0.4 10*3/uL (ref 0.1–1.0)
Monocytes Relative: 8 % (ref 3–12)
Neutro Abs: 2.1 10*3/uL (ref 1.7–7.7)
Neutrophils Relative %: 45 % (ref 43–77)
Platelets: 260 10*3/uL (ref 150–400)
RBC: 4.41 MIL/uL (ref 3.87–5.11)
RDW: 13.2 % (ref 11.5–15.5)
WBC: 4.7 10*3/uL (ref 4.0–10.5)

## 2014-08-26 LAB — COMPREHENSIVE METABOLIC PANEL
ALT: 20 U/L (ref 0–35)
AST: 19 U/L (ref 0–37)
Albumin: 4.2 g/dL (ref 3.5–5.2)
Alkaline Phosphatase: 69 U/L (ref 39–117)
BUN: 8 mg/dL (ref 6–23)
CO2: 27 mEq/L (ref 19–32)
Calcium: 9.5 mg/dL (ref 8.4–10.5)
Chloride: 106 mEq/L (ref 96–112)
Creat: 0.82 mg/dL (ref 0.50–1.10)
Glucose, Bld: 102 mg/dL — ABNORMAL HIGH (ref 70–99)
Potassium: 4.3 mEq/L (ref 3.5–5.3)
Sodium: 142 mEq/L (ref 135–145)
Total Bilirubin: 0.7 mg/dL (ref 0.2–1.2)
Total Protein: 6.3 g/dL (ref 6.0–8.3)

## 2014-08-26 LAB — LIPID PANEL
Cholesterol: 164 mg/dL (ref 0–200)
HDL: 50 mg/dL
LDL Cholesterol: 95 mg/dL (ref 0–99)
Total CHOL/HDL Ratio: 3.3 ratio
Triglycerides: 94 mg/dL
VLDL: 19 mg/dL (ref 0–40)

## 2014-08-26 LAB — HEPATITIS C ANTIBODY: HCV Ab: NEGATIVE

## 2014-08-26 LAB — CK: Total CK: 124 U/L (ref 7–177)

## 2014-08-26 MED ORDER — FLUTICASONE PROPIONATE 50 MCG/ACT NA SUSP: 2.0000 | Freq: Two times a day (BID) | NASAL | Status: DC

## 2014-08-26 NOTE — Progress Notes (Signed)
Subjective:  This chart was scribed for Tami Lin, MD by Donato Schultz, Medical Scribe. This patient was seen in Room 25 and the patient's care was started at 11:15 AM.   Patient ID: Breanna Sharp, female    DOB: 09-15-1951, 63 y.o.   MRN: [id number]  HPI HPI Comments: Breanna Sharp is a 63 y.o. female with a history of hypertension and hyperlipidemia who presents to the [facility] for an annual exam.  She was seen last year at [facility] and complaining of myalgias.  She had a CPK done at that time which was normal.  She had more blood work done in April by her cardiologist Dr. Debara Pickett and her LDL particle size was high but had been reduced from previous lab work.  She in intolerant to statins.    Today, she states that for the past 4 months she has been feeling cramping in her abdominal muscles after doing 10 sit ups.  She used to be able to do 10 sit ups without any problems.  She has tried reducing the weight on the machine with no relief to her symptoms.  Her legs are straight out when she does her sit ups on the floor and bent when she is sitting on a machine.  She  still experienced these symptoms when she was not taking Livalo.    Her dizziness has resolved since her last visit and her last visit with the neurologist was last week.  She was diagnosed with vertigo and experienced relief to her symptoms with exercise.    She had a mammogram last week.  Her last colonoscopy was in 2008.  She had a Tdap last year.  She has never been screened for Hepatitis C but she was vaccinated for the disease.    She needs a refill of flonase.  She still takes Vitamin D daily but does not take any calcium with it.    S/p hyst-adeno-needs 1st dexa  husb hx hep c she's ? Never screened Patient Active Problem List   Diagnosis Date Noted  . DOE (dyspnea on exertion) 05/02/2013  . Chronic sinusitis 05/02/2013  . Vertigo 08/14/2012  . HTN (hypertension) 08/14/2012  .  hyperlipidemia 08/14/2012  . BMI 28.0-28.9,adult 08/14/2012  . COLONIC POLYPS 01/24/2008  . ACUT GASTR ULCER W/O MENTION HEMORR PERF/OBST 01/24/2008   Past Medical History  Diagnosis Date  . Cervical cancer   . Hypertension   . Dyslipidemia     statin intolerance  . Vertigo    Past Surgical History  Procedure Laterality Date  . Abdominal hysterectomy  1972    cervical cancer   Allergies  Allergen Reactions  . Statins     Muscle cramps   Prior to Admission medications   Medication Sig Start Date End Date Taking? Authorizing Provider  cholecalciferol (VITAMIN D) 1000 UNITS tablet Take 2,000 Units by mouth daily.     Historical Provider, MD  ezetimibe (ZETIA) 10 MG tablet Take 1 tablet (10 mg total) by mouth daily. 04/28/14   Pixie Casino, MD  fexofenadine (ALLEGRA) 180 MG tablet Take 1 tablet (180 mg total) by mouth daily. 10/04/13   Robyn Haber, MD  fluticasone (FLONASE) 50 MCG/ACT nasal spray Place 2 sprays into both nostrils 2 (two) times daily. 10/04/13   Robyn Haber, MD  lisinopril-hydrochlorothiazide (PRINZIDE,ZESTORETIC) 10-12.5 MG per tablet Take 1 tablet by mouth daily. 04/28/14   Pixie Casino, MD  Pitavastatin Calcium (LIVALO) 2 MG TABS Take  2 mg by mouth daily.    Historical Provider, MD   History   Social History  . Marital Status: Married    Spouse Name: [name]    Number of Children: 0  . Years of Education: 12   Occupational History  .      Retired   Social History Main Topics  . Smoking status: Former Smoker -- 32 years    Types: Cigarettes    Quit date: 11/27/2002  . Smokeless tobacco: Never Used  . Alcohol Use: 0.0 oz/week     Comment: social - 2 or 3 times a month 1-2 glasses of wine  . Drug Use: No  . Sexual Activity: Yes    Partners: Male   Other Topics Concern  . Not on file   Social History Narrative   Exercise 3 to 4 times/week walking for 45 min -1 hour   Patient lives at home with her husband Careers adviser).   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   Right handed.   Caffeine Green Tea.     Review of Systems  Musculoskeletal: Positive for myalgias.  Neurological: Negative for dizziness.     Objective:  Physical Exam  Nursing note and vitals reviewed. Constitutional: She is oriented to person, place, and time. She appears well-developed and well-nourished. No distress.  HENT:  Head: Normocephalic and atraumatic.  Right Ear: Tympanic membrane and external ear normal.  Left Ear: Tympanic membrane and external ear normal.  Nose: Nose normal.  Mouth/Throat: Oropharynx is clear and moist. No oropharyngeal exudate.  Eyes: Conjunctivae and EOM are normal. Pupils are equal, round, and reactive to light.  Neck: Normal range of motion. Neck supple. No thyromegaly present.  Cardiovascular: Normal rate, regular rhythm, normal heart sounds and intact distal pulses.   No murmur heard. Pulmonary/Chest: Effort normal and breath sounds normal. No respiratory distress. She has no wheezes. She has no rales. Right breast exhibits no mass and no tenderness. Left breast exhibits no mass and no tenderness.  Abdominal: Soft. Bowel sounds are normal. She exhibits no distension and no mass. There is no tenderness. There is no rebound.  Musculoskeletal: Normal range of motion. She exhibits no edema and no tenderness.  Lymphadenopathy:    She has no cervical adenopathy.  Neurological: She is alert and oriented to person, place, and time. She has normal reflexes. No cranial nerve deficit.  Skin: Skin is warm and dry. No rash noted.  Psychiatric: She has a normal mood and affect. Her behavior is normal. Judgment and thought content normal.    Wt Readings from Last 3 Encounters:  08/26/14 157 lb 6.4 oz (71.396 kg)  06/10/14 157 lb 9.6 oz (71.487 kg)  04/28/14 163 lb 3.2 oz (74.027 kg)    BP 128/72  Pulse 78  Temp(Src) 98.2 F (36.8 C) (Oral)  Resp 16  Ht 5\' 4"  (1.626 m)  Wt 157 lb 6.4 oz (71.396 kg)  BMI 27.00  kg/m2  SpO2 97% Assessment & Plan:  Need for prophylactic vaccination and inoculation against influenza - Plan: Flu Vaccine QUAD 36+ mos IM, Comprehensive metabolic panel  Vertigo---resolved chronic problem with exercise  hyperlipidemia - Plan: Comprehensive metabolic panel, Lipid panel  Essential hypertension - Plan: CBC with Differential, Comprehensive metabolic panel  Need for hepatitis C screening test - Plan: Hepatitis C antibody  Myalgia and myositis - Plan: Comprehensive metabolic panel, CK  Post-menopause - Plan: DG Bone Density On D2000///added calcium  Meds ordered this encounter  Medications  . fluticasone (FLONASE) 50 MCG/ACT nasal spray    Sig: Place 2 sprays into both nostrils 2 (two) times daily.    Dispense:  16 g    Refill:  10   Dr Debara Pickett other meds   I have completed the patient encounter in its entirety as documented by the scribe, with editing by me where necessary. Lauralye Kinn P. Laney Pastor, M.D.

## 2014-08-31 ENCOUNTER — Encounter: Payer: Self-pay | Admitting: Internal Medicine

## 2014-09-30 ENCOUNTER — Ambulatory Visit (INDEPENDENT_AMBULATORY_CARE_PROVIDER_SITE_OTHER): Payer: 59 | Admitting: Family Medicine

## 2014-09-30 ENCOUNTER — Ambulatory Visit (INDEPENDENT_AMBULATORY_CARE_PROVIDER_SITE_OTHER): Payer: 59

## 2014-09-30 VITALS — BP 130/76 | HR 74 | Temp 98.3°F | Resp 16 | Ht 63.75 in | Wt 157.8 lb

## 2014-09-30 DIAGNOSIS — M25551 Pain in right hip: Secondary | ICD-10-CM

## 2014-09-30 NOTE — Progress Notes (Signed)
Subjective:    Patient ID: Breanna Sharp, female    DOB: [birth year], 63 y.o.   MRN: [id number]  HPI  Breanna Sharp is 63 y.o. female with pmh of HL, myalgias with statins presenting for intermittent right-sided inguinal pain for the last 3 years. This particular episode has a 3 week history, pain level is 4/10 when at its worst, with no known precipitators, no known relieving factors. The pain was constant 2 weeks ago, relieved some with Advil for 2 days but remains unresolved. Denies bulges, decreased hip strength or ROM, swelling, erythema, rash. Of note, HL is managed by her cardiologist and previously patient has had significant myalgias with statins. Currently, she is on a low dose pitavastatin, admits this pain is different from myalgias. Patient is retired, takes general fitness classes, walks 3-5 miles a day. Eats healthy. Denies smoking, occasional alcohol use. Denies any other aggravating or relieving factors, no other questions or concerns.   Prior to Admission medications   Medication Sig Start Date End Date Taking? Authorizing Provider  calcium carbonate (TUMS EX) 750 MG chewable tablet Chew 1 tablet by mouth daily.   Yes Historical Provider, MD  cholecalciferol (VITAMIN D) 1000 UNITS tablet Take 2,000 Units by mouth daily.    Yes Historical Provider, MD  ezetimibe (ZETIA) 10 MG tablet Take 1 tablet (10 mg total) by mouth daily. 04/28/14  Yes Pixie Casino, MD  fexofenadine (ALLEGRA) 180 MG tablet Take 1 tablet (180 mg total) by mouth daily. 10/04/13  Yes Robyn Haber, MD  fluticasone (FLONASE) 50 MCG/ACT nasal spray Place 2 sprays into both nostrils 2 (two) times daily. 08/26/14  Yes Leandrew Koyanagi, MD  lisinopril-hydrochlorothiazide (PRINZIDE,ZESTORETIC) 10-12.5 MG per tablet Take 1 tablet by mouth daily. 04/28/14  Yes Pixie Casino, MD  Pitavastatin Calcium (LIVALO) 2 MG TABS Take 2 mg by mouth daily.   Yes Historical Provider, MD    Allergies  Allergen Reactions    . Statins     Muscle cramps    Past Medical History  Diagnosis Date  . Cervical cancer   . Hypertension   . Dyslipidemia     statin intolerance  . Vertigo     Past Surgical History  Procedure Laterality Date  . Abdominal hysterectomy  1972    cervical cancer    Review of Systems As in subjective.    Objective:   Physical Exam  Constitutional: She appears well-developed and well-nourished. No distress.  BP 130/76 mmHg  Pulse 74  Temp(Src) 98.3 F (36.8 C) (Oral)  Resp 16  Ht 5' 3.75" (1.619 m)  Wt 157 lb 12.8 oz (71.578 kg)  BMI 27.31 kg/m2  SpO2 97%  Wt Readings from Last 3 Encounters: 09/30/14 : 157 lb 12.8 oz (71.578 kg) 08/26/14 : 157 lb 6.4 oz (71.396 kg) 06/10/14 : 157 lb 9.6 oz (71.487 kg)   Cardiovascular: Normal rate and intact distal pulses.   Pulmonary/Chest: Effort normal and breath sounds normal. No respiratory distress.  Abdominal: Soft. Bowel sounds are normal. She exhibits no distension and no mass. There is no tenderness.  Musculoskeletal: Normal range of motion. She exhibits no edema or tenderness.  Excellent hip ROM, strength 5/5.  Skin: Skin is warm and dry. No rash noted. She is not diaphoretic. No erythema.   [facility] reading (PRIMARY) by  Dr. Lorelei Pont and PA-Kayelynn Abdou. Right Hip X-ray: signs of mild degenerative changes. Otherwise bony landmarks are intact, no signs of fracture or dislocation. Soft tissue unremarkable.  Assessment & Plan:   1. Hip pain, right - Likely d/t mild degenerative changes and overuse - Advised patient to rest/cut back on vigorous exercise, may return to walking gradually as symptoms resolve - Advised Tylenol PRN for pain - Return to clinic if symptoms worsen, fail to resolve or as needed - DG Hip Complete Right; Future   Jaynee Eagles, PA-C [facility] [phone] 09/30/2014 1:05 PM

## 2014-09-30 NOTE — Patient Instructions (Signed)
Your hip X-ray shows mild signs of arthritis in your right hip. Please take Tylenol for pain as needed. We will call you after Radiologist reads your X-ray. Please return to clinic if symptoms worsen, fail to resolve or as needed.

## 2014-10-08 ENCOUNTER — Telehealth: Payer: Self-pay | Admitting: Urgent Care

## 2014-10-08 ENCOUNTER — Telehealth: Payer: Self-pay

## 2014-10-08 NOTE — Telephone Encounter (Signed)
Patient returned call to [facility].  Best number is cell [phone]

## 2014-10-08 NOTE — Telephone Encounter (Signed)
The patient called to request referral from Jaynee Eagles, PA-C to a physician about her hip/leg pain.  The patient states that Jaynee Eagles, PA-C was supposed to return her call today, however I advised the patient that he was not in the office today.  The patient was unhappy that she had not received a return call as of yet.  Again, I advised the patient that PA Bess Harvest is not scheduled today and will be in the office tomorrow 10/09/14.  The patient requests return call to [phone].

## 2014-10-08 NOTE — Telephone Encounter (Signed)
Called patient to follow up on hip pain and discuss radiologist reading of hip x-ray.  Jaynee Eagles, PA-C [facility] [phone] 10/08/2014 9:18 AM

## 2014-10-09 NOTE — Telephone Encounter (Signed)
Spoke with patient today 10/09/2014 regarding her X-ray results and radiologist reading. Confirmed with patient that she has degenerative changes of her hips bilaterally. Advised modification of exercises to include walking, bicycling or swimming, warm up and stretch prior to exercise routine, Tylenol PRN for pain, return to clinic if symptoms worsen, fail to resolve or as needed.  Jaynee Eagles, PA-C [facility] [phone] 10/08/2014 9:53 AM

## 2014-10-09 NOTE — Telephone Encounter (Signed)
Patient is returning call again to [facility] PA-C or anyone who can help her. She needs to know what to do about her leg.   [phone]

## 2014-12-13 ENCOUNTER — Ambulatory Visit (INDEPENDENT_AMBULATORY_CARE_PROVIDER_SITE_OTHER): Payer: 59 | Admitting: Family Medicine

## 2014-12-13 VITALS — BP 138/82 | HR 95 | Temp 98.3°F | Resp 17 | Ht 64.5 in | Wt 161.0 lb

## 2014-12-13 DIAGNOSIS — J309 Allergic rhinitis, unspecified: Secondary | ICD-10-CM

## 2014-12-13 DIAGNOSIS — H9313 Tinnitus, bilateral: Secondary | ICD-10-CM

## 2014-12-13 DIAGNOSIS — H93A3 Pulsatile tinnitus, bilateral: Secondary | ICD-10-CM

## 2014-12-13 DIAGNOSIS — H6503 Acute serous otitis media, bilateral: Secondary | ICD-10-CM

## 2014-12-13 MED ORDER — IPRATROPIUM BROMIDE 0.06 % NA SOLN: 2.0000 | Freq: Three times a day (TID) | NASAL | Status: DC | PRN

## 2014-12-13 NOTE — Progress Notes (Signed)
Subjective:    Patient ID: Breanna Sharp, female    DOB: December 02, 1950, 64 y.o.   MRN: [id number] This chart was scribed for Breanna Ray, MD by Marti Sleigh, Medical Scribe. This patient was seen in Room 10 and the patient's care was started a 8:18 AM.  Chief Complaint  Patient presents with  . Tinnitus  . Sinusitis    HPI HPI Comments: Breanna Sharp is a 64 y.o. female with a hx of HTN, vertigo, and chronic sinusitis who presents to [facility] complaining of sinus drainage for the last three weeks. Pt also states she has been able to hear her heartbeat in her ears, bilaterally for the last three days, worse at night. Pt endorses associated sneezing, rhinorrhea. Pt also endorses occasional vertigo, no change from baseline. Pt states three weeks ago her husband wore a new cologne which worsened her allergy sx. Pt denies fever, HA, nausea, vomiting. No hx of heart issues. Pt denies hx of carotid bruit. Pt uses Flonase, 2 sprays twice per day. Pt [place].   Pt has a hx of vertigo, diagnosed by neurology in 2010 with most recent visit in May, 2015. Pt has been treated with vestibular exercises for this condition.    Patient Active Problem List   Diagnosis Date Noted  . DOE (dyspnea on exertion) 05/02/2013  . Chronic sinusitis 05/02/2013  . Vertigo 08/14/2012  . HTN (hypertension) 08/14/2012  . hyperlipidemia 08/14/2012  . BMI 28.0-28.9,adult 08/14/2012  . COLONIC POLYPS 01/24/2008  . ACUT GASTR ULCER W/O MENTION HEMORR PERF/OBST 01/24/2008   Past Medical History  Diagnosis Date  . Cervical cancer   . Hypertension   . Dyslipidemia     statin intolerance  . Vertigo    Past Surgical History  Procedure Laterality Date  . Abdominal hysterectomy  1972    cervical cancer   Allergies  Allergen Reactions  . Statins     Muscle cramps   Prior to Admission medications   Medication Sig Start Date End Date Taking? Authorizing Provider    calcium carbonate (TUMS EX) 750 MG chewable tablet Chew 1 tablet by mouth daily.   Yes Historical Provider, MD  cholecalciferol (VITAMIN D) 1000 UNITS tablet Take 2,000 Units by mouth daily.    Yes Historical Provider, MD  ezetimibe (ZETIA) 10 MG tablet Take 1 tablet (10 mg total) by mouth daily. 04/28/14  Yes Pixie Casino, MD  fexofenadine (ALLEGRA) 180 MG tablet Take 1 tablet (180 mg total) by mouth daily. 10/04/13  Yes Robyn Haber, MD  fluticasone (FLONASE) 50 MCG/ACT nasal spray Place 2 sprays into both nostrils 2 (two) times daily. 08/26/14  Yes Leandrew Koyanagi, MD  lisinopril-hydrochlorothiazide (PRINZIDE,ZESTORETIC) 10-12.5 MG per tablet Take 1 tablet by mouth daily. 04/28/14  Yes Pixie Casino, MD  Pitavastatin Calcium (LIVALO) 2 MG TABS Take 2 mg by mouth daily.   Yes Historical Provider, MD   History   Social History  . Marital Status: Married    Spouse Name: [name]    Number of Children: 0  . Years of Education: 12   Occupational History  .      Retired   Social History Main Topics  . Smoking status: Former Smoker -- 32 years    Types: Cigarettes    Quit date: 11/27/2002  . Smokeless tobacco: Never Used  . Alcohol Use: 0.0 oz/week     Comment: social - 2 or 3 times a month  1-2 glasses of wine  . Drug Use: No  . Sexual Activity:    Partners: Male   Other Topics Concern  . Not on file   Social History Narrative   Exercise 3 to 4 times/week walking for 45 min -1 hour   Patient lives at home with her husband Careers adviser).   Patient  Is retired   [employer] school.   Right handed.   Caffeine Green Tea.    Review of Systems  Constitutional: Negative for fever and chills.  HENT: Positive for congestion, rhinorrhea and tinnitus.        Hearing disturbance.  Respiratory:       Sneezing  Gastrointestinal: Negative for nausea and vomiting.  Neurological: Positive for dizziness. Negative for headaches.       Objective:   Physical Exam   Constitutional: She is oriented to person, place, and time. She appears well-developed and well-nourished.  HENT:  Head: Normocephalic and atraumatic.  Unable to completely visualize TM due to cerumen in canal, but the portion able to visualize is pearly grey. No sinus tenderness.  Eyes: Pupils are equal, round, and reactive to light.  1-2 beats of horizontal nystagmus.  Neck: Neck supple.  Cardiovascular: Normal rate, regular rhythm and normal heart sounds.   Pulmonary/Chest: Effort normal and breath sounds normal. No respiratory distress.  Musculoskeletal: Normal range of motion.  Neurological: She is alert and oriented to person, place, and time.  Negative romberg. No pronator drift. Normal heel to toe.  Skin: Skin is warm and dry.  Psychiatric: She has a normal mood and affect. Her behavior is normal.  Nursing note and vitals reviewed.  Filed Vitals:   12/13/14 0814  BP: 138/82  Pulse: 95  Temp: 98.3 F (36.8 C)  TempSrc: Oral  Resp: 17  Height: 5' 4.5" (1.638 m)  Weight: 161 lb (73.029 kg)  SpO2: 94%         Assessment & Plan:   Breanna Sharp is a 64 y.o. female Pulsatile tinnitus of both ears - Bilateral acute serous otitis media, recurrence not specified Plan: Ambulatory referral to ENT,   -underlying allergies, but also with previous travel to [place] may be cause of serous otitis. No sign of infection on visualized TM. No carotid bruit noted, but will refer to ENT to evaluate pulsatile tinnitus.  If resolves - advised to call and cancel that appointment. rtc sooner if worse. Trial of afrin if needed up to 2 days - watch BP, and cont flonase NS. rtc precautions.   Allergic rhinitis, unspecified allergic rhinitis type - Plan: ipratropium (ATROVENT) 0.06 % nasal spray  -cont flonase, trial of atrovent NS. Trigger avoidance.    Meds ordered this encounter  Medications  . ipratropium (ATROVENT) 0.06 % nasal spray    Sig: Place 2 sprays into the nose 3 (three)  times daily as needed for rhinitis.    Dispense:  15 mL    Refill:  1   Patient Instructions  I will refer you to ENT, but for now - can continue flonase nasal spray, ipratropium nasal spray as needed, and if congested - can try Afrin nasal spray up to 2 days only. Return to the clinic or go to the nearest emergency room if any of your symptoms worsen or new symptoms occur.  Serous Otitis Media Serous otitis media is fluid in the middle ear space. This space contains the bones for hearing and air. Air in the middle ear space helps to transmit sound.  The air gets there through the eustachian tube. This tube goes from the back of the nose (nasopharynx) to the middle ear space. It keeps the pressure in the middle ear the same as the outside world. It also helps to drain fluid from the middle ear space. CAUSES  Serous otitis media occurs when the eustachian tube gets blocked. Blockage can come from:  Ear infections.  Colds and other upper respiratory infections.  Allergies.  Irritants such as cigarette smoke.  Sudden changes in air pressure (such as descending in an airplane).  Enlarged adenoids.  A mass in the nasopharynx. During colds and upper respiratory infections, the middle ear space can become temporarily filled with fluid. This can happen after an ear infection also. Once the infection clears, the fluid will generally drain out of the ear through the eustachian tube. If it does not, then serous otitis media occurs. SIGNS AND SYMPTOMS   Hearing loss.  A feeling of fullness in the ear, without pain.  Young children may not show any symptoms but may show slight behavioral changes, such as agitation, ear pulling, or crying. DIAGNOSIS  Serous otitis media is diagnosed by an ear exam. Tests may be done to check on the movement of the eardrum. Hearing exams may also be done. TREATMENT  The fluid most often goes away without treatment. If allergy is the cause, allergy treatment may  be helpful. Fluid that persists for several months may require minor surgery. A small tube is placed in the eardrum to:  Drain the fluid.  Restore the air in the middle ear space. In certain situations, antibiotic medicines are used to avoid surgery. Surgery may be done to remove enlarged adenoids (if this is the cause). HOME CARE INSTRUCTIONS   Keep children away from tobacco smoke.  Keep all follow-up visits as directed by your health care provider. SEEK MEDICAL CARE IF:   Your hearing is not better in 3 months.  Your hearing is worse.  You have ear pain.  You have drainage from the ear.  You have dizziness.  You have serous otitis media only in one ear or have any bleeding from your nose (epistaxis).  You notice a lump on your neck. MAKE SURE YOU:  Understand these instructions.   Will watch your condition.   Will get help right away if you are not doing well or get worse.  Document Released: 02/03/2004 Document Revised: 03/30/2014 Document Reviewed: 06/10/2013 [facility] Patient Information 2015 [place], [place]. This information is not intended to replace advice given to you by your health care provider. Make sure you discuss any questions you have with your health care provider.  Tinnitus Sounds you hear in your ears and coming from within the ear is called tinnitus. This can be a symptom of many ear disorders. It is often associated with hearing loss.  Tinnitus can be seen with:  Infections.  Ear blockages such as wax buildup.  Meniere's disease.  Ear damage.  Inherited.  Occupational causes. While irritating, it is not usually a threat to health. When the cause of the tinnitus is wax, infection in the middle ear, or foreign body it is easily treated. Hearing loss will usually be reversible.  TREATMENT  When treating the underlying cause does not get rid of tinnitus, it may be necessary to get rid of the unwanted sound by covering it up with more pleasant  background noises. This may include music, the radio etc. There are tinnitus maskers which can be worn which  produce background noise to cover up the tinnitus. Avoid all medications which tend to make tinnitus worse such as alcohol, caffeine, aspirin, and nicotine. There are many soothing background tapes such as rain, ocean, thunderstorms, etc. These soothing sounds help with sleeping or resting. Keep all follow-up appointments and referrals. This is important to identify the cause of the problem. It also helps avoid complications, impaired hearing, disability, or chronic pain. Document Released: 11/13/2005 Document Revised: 02/05/2012 Document Reviewed: 07/01/2008 [facility] Patient Information 2015 [place], [place]. This information is not intended to replace advice given to you by your health care provider. Make sure you discuss any questions you have with your health care provider.     I personally performed the services described in this documentation, which was scribed in my presence. The recorded information has been reviewed and considered, and addended by me as needed.

## 2014-12-13 NOTE — Patient Instructions (Signed)
I will refer you to ENT, but for now - can continue flonase nasal spray, ipratropium nasal spray as needed, and if congested - can try Afrin nasal spray up to 2 days only. Return to the clinic or go to the nearest emergency room if any of your symptoms worsen or new symptoms occur.  Serous Otitis Media Serous otitis media is fluid in the middle ear space. This space contains the bones for hearing and air. Air in the middle ear space helps to transmit sound.  The air gets there through the eustachian tube. This tube goes from the back of the nose (nasopharynx) to the middle ear space. It keeps the pressure in the middle ear the same as the outside world. It also helps to drain fluid from the middle ear space. CAUSES  Serous otitis media occurs when the eustachian tube gets blocked. Blockage can come from:  Ear infections.  Colds and other upper respiratory infections.  Allergies.  Irritants such as cigarette smoke.  Sudden changes in air pressure (such as descending in an airplane).  Enlarged adenoids.  A mass in the nasopharynx. During colds and upper respiratory infections, the middle ear space can become temporarily filled with fluid. This can happen after an ear infection also. Once the infection clears, the fluid will generally drain out of the ear through the eustachian tube. If it does not, then serous otitis media occurs. SIGNS AND SYMPTOMS   Hearing loss.  A feeling of fullness in the ear, without pain.  Young children may not show any symptoms but may show slight behavioral changes, such as agitation, ear pulling, or crying. DIAGNOSIS  Serous otitis media is diagnosed by an ear exam. Tests may be done to check on the movement of the eardrum. Hearing exams may also be done. TREATMENT  The fluid most often goes away without treatment. If allergy is the cause, allergy treatment may be helpful. Fluid that persists for several months may require minor surgery. A small tube is  placed in the eardrum to:  Drain the fluid.  Restore the air in the middle ear space. In certain situations, antibiotic medicines are used to avoid surgery. Surgery may be done to remove enlarged adenoids (if this is the cause). HOME CARE INSTRUCTIONS   Keep children away from tobacco smoke.  Keep all follow-up visits as directed by your health care provider. SEEK MEDICAL CARE IF:   Your hearing is not better in 3 months.  Your hearing is worse.  You have ear pain.  You have drainage from the ear.  You have dizziness.  You have serous otitis media only in one ear or have any bleeding from your nose (epistaxis).  You notice a lump on your neck. MAKE SURE YOU:  Understand these instructions.   Will watch your condition.   Will get help right away if you are not doing well or get worse.  Document Released: 02/03/2004 Document Revised: 03/30/2014 Document Reviewed: 06/10/2013 [facility] Patient Information 2015 [place], [place]. This information is not intended to replace advice given to you by your health care provider. Make sure you discuss any questions you have with your health care provider.  Tinnitus Sounds you hear in your ears and coming from within the ear is called tinnitus. This can be a symptom of many ear disorders. It is often associated with hearing loss.  Tinnitus can be seen with:  Infections.  Ear blockages such as wax buildup.  Meniere's disease.  Ear damage.  Inherited.  Occupational causes. While irritating, it is not usually a threat to health. When the cause of the tinnitus is wax, infection in the middle ear, or foreign body it is easily treated. Hearing loss will usually be reversible.  TREATMENT  When treating the underlying cause does not get rid of tinnitus, it may be necessary to get rid of the unwanted sound by covering it up with more pleasant background noises. This may include music, the radio etc. There are tinnitus maskers which can  be worn which produce background noise to cover up the tinnitus. Avoid all medications which tend to make tinnitus worse such as alcohol, caffeine, aspirin, and nicotine. There are many soothing background tapes such as rain, ocean, thunderstorms, etc. These soothing sounds help with sleeping or resting. Keep all follow-up appointments and referrals. This is important to identify the cause of the problem. It also helps avoid complications, impaired hearing, disability, or chronic pain. Document Released: 11/13/2005 Document Revised: 02/05/2012 Document Reviewed: 07/01/2008 [facility] Patient Information 2015 [place], [place]. This information is not intended to replace advice given to you by your health care provider. Make sure you discuss any questions you have with your health care provider.

## 2014-12-13 NOTE — Addendum Note (Signed)
Addended by: Kem Boroughs D on: 12/13/2014 08:48 AM   Modules accepted: Medications

## 2015-02-04 ENCOUNTER — Ambulatory Visit (INDEPENDENT_AMBULATORY_CARE_PROVIDER_SITE_OTHER): Payer: 59 | Admitting: Family Medicine

## 2015-02-04 ENCOUNTER — Ambulatory Visit (INDEPENDENT_AMBULATORY_CARE_PROVIDER_SITE_OTHER): Payer: 59

## 2015-02-04 VITALS — BP 130/82 | HR 78 | Temp 97.9°F | Resp 16 | Ht 64.5 in | Wt 160.0 lb

## 2015-02-04 DIAGNOSIS — M5412 Radiculopathy, cervical region: Secondary | ICD-10-CM

## 2015-02-04 DIAGNOSIS — M542 Cervicalgia: Secondary | ICD-10-CM

## 2015-02-04 MED ORDER — METHOCARBAMOL 500 MG PO TABS: ORAL_TABLET | ORAL | Status: DC

## 2015-02-04 MED ORDER — DICLOFENAC SODIUM 75 MG PO TBEC: 75.0000 mg | DELAYED_RELEASE_TABLET | Freq: Two times a day (BID) | ORAL | Status: DC

## 2015-02-04 MED ORDER — PREDNISONE 20 MG PO TABS: ORAL_TABLET | ORAL | Status: DC

## 2015-02-04 NOTE — Addendum Note (Signed)
Addended by: Sunday Spillers on: 02/04/2015 01:49 PM   Modules accepted: Orders

## 2015-02-04 NOTE — Patient Instructions (Addendum)
Take prednisone 3 pills daily for 2 days, then 2 daily for 2 days, then 1 daily for 2 days. Best taken after breakfast  Take diclofenac one twice daily at breakfast and supper for pain and inflammation  Take the muscle relaxant one in the morning, 1 afternoon, and 2 at bedtime as needed for tightness of muscles in neck  If not much better in 2 weeks please return  Referral is being made for physical therapy. You should hear from that in the next few days.
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
## 2015-02-09 ENCOUNTER — Telehealth: Payer: Self-pay

## 2015-02-09 NOTE — Telephone Encounter (Signed)
Pt returning our call re radiology results   Best phone for pt is [phone]

## 2015-03-01 DIAGNOSIS — M542 Cervicalgia: Secondary | ICD-10-CM | POA: Diagnosis not present

## 2015-03-03 ENCOUNTER — Telehealth: Payer: Self-pay | Admitting: Internal Medicine

## 2015-03-03 ENCOUNTER — Other Ambulatory Visit: Payer: Self-pay | Admitting: *Deleted

## 2015-03-03 DIAGNOSIS — M542 Cervicalgia: Secondary | ICD-10-CM | POA: Diagnosis not present

## 2015-03-03 MED ORDER — EZETIMIBE 10 MG PO TABS: 10.0000 mg | ORAL_TABLET | Freq: Every day | ORAL | Status: DC

## 2015-03-03 NOTE — Telephone Encounter (Signed)
Rx(s) sent to pharmacy electronically.  

## 2015-03-03 NOTE — Telephone Encounter (Signed)
Pt need prior authorization for her Livalo. Please call to [facility]-[phone].

## 2015-03-03 NOTE — Telephone Encounter (Signed)
Called [employer] - no PA is necessary - medication is approved  Patient notified

## 2015-03-03 NOTE — Telephone Encounter (Signed)
FORWARD TO Marko Stai RN

## 2015-03-04 ENCOUNTER — Other Ambulatory Visit: Payer: Self-pay

## 2015-03-04 MED ORDER — PITAVASTATIN CALCIUM 2 MG PO TABS: 2.0000 mg | ORAL_TABLET | Freq: Every day | ORAL | Status: DC

## 2015-03-04 MED ORDER — LISINOPRIL-HYDROCHLOROTHIAZIDE 10-12.5 MG PO TABS
1.0000 | ORAL_TABLET | Freq: Every day | ORAL | Status: DC
Start: 2015-03-04 — End: 2015-04-28

## 2015-03-04 NOTE — Telephone Encounter (Signed)
Rx(s) sent to pharmacy electronically.  

## 2015-03-15 ENCOUNTER — Other Ambulatory Visit: Payer: Self-pay

## 2015-03-15 DIAGNOSIS — M542 Cervicalgia: Secondary | ICD-10-CM | POA: Diagnosis not present

## 2015-03-15 MED ORDER — FLUTICASONE PROPIONATE 50 MCG/ACT NA SUSP: 2.0000 | Freq: Two times a day (BID) | NASAL | Status: DC

## 2015-03-17 DIAGNOSIS — M542 Cervicalgia: Secondary | ICD-10-CM | POA: Diagnosis not present

## 2015-03-22 DIAGNOSIS — M542 Cervicalgia: Secondary | ICD-10-CM | POA: Diagnosis not present

## 2015-03-26 DIAGNOSIS — M542 Cervicalgia: Secondary | ICD-10-CM | POA: Diagnosis not present

## 2015-03-29 DIAGNOSIS — M542 Cervicalgia: Secondary | ICD-10-CM | POA: Diagnosis not present

## 2015-04-01 DIAGNOSIS — M542 Cervicalgia: Secondary | ICD-10-CM | POA: Diagnosis not present

## 2015-04-28 ENCOUNTER — Ambulatory Visit (INDEPENDENT_AMBULATORY_CARE_PROVIDER_SITE_OTHER): Payer: Medicare Other | Admitting: Internal Medicine

## 2015-04-28 ENCOUNTER — Encounter: Payer: Self-pay | Admitting: Internal Medicine

## 2015-04-28 VITALS — BP 132/76 | HR 77 | Ht 64.5 in | Wt 156.6 lb

## 2015-04-28 DIAGNOSIS — R000 Tachycardia, unspecified: Secondary | ICD-10-CM | POA: Diagnosis not present

## 2015-04-28 DIAGNOSIS — R0602 Shortness of breath: Secondary | ICD-10-CM

## 2015-04-28 DIAGNOSIS — E785 Hyperlipidemia, unspecified: Secondary | ICD-10-CM | POA: Diagnosis not present

## 2015-04-28 MED ORDER — PITAVASTATIN CALCIUM 2 MG PO TABS: 2.0000 mg | ORAL_TABLET | Freq: Every day | ORAL | Status: DC

## 2015-04-28 MED ORDER — LISINOPRIL-HYDROCHLOROTHIAZIDE 10-12.5 MG PO TABS: 1.0000 | ORAL_TABLET | Freq: Every day | ORAL | Status: DC

## 2015-04-28 NOTE — Progress Notes (Signed)
OFFICE NOTE  Chief Complaint:  Short of breath with inclines  Primary Care Physician: Leandrew Koyanagi, MD  HPI:  Breanna Sharp is a pleasant 64 year old female with a history of hypertension and dyslipidemia. Unfortunately she's been intolerant to statins in the past and has failed both Lipitor Zocor Crestor and pravastatin. She is reported some increasing shortness of breath mostly when walking upstairs, however has been struggling with recurrent sinusitis and upper respiratory symptoms. Based on her risk factors, recommended metabolic testing. She underwent cardiopulmonary testing on 04/22/2013. She had maximal effort of 1.08 our ER. Peak VO2 was 108% predicted. Heart rate was 94% predicted. Her heart rate in view to curves were essentially normal with late flattening of her view to curve. Overall the study is low risk. She also went underwent palmar he function testing which showed normal diffusion, volume and flow loops. At her last visit she started taking Livalo samples due to an abnormal lipid profile. Her total cholesterol was 213, triglycerides 122, HDL 55 and LDL 134. Over the intial month on Livalo, she seemed to tolerate it.   Then she reported some right flank pain, especially when lifting weights, which he had started to do at the same time. She stopped her cholesterol medicine and stopped her exercise and her symptoms improved. She then restarted her exercise and had symptoms and realized that it was not likely the cholesterol medicine that was causing her right side pain. Her repeat cholesterol profile was abnormally high on only Zetia monotherapy, as she discontinued Livalo due to cramps. Her LDL particle number was 3189, LDL content was 190, HDL C. was 60 and triglycerides 148.    Breanna Sharp returns today for followup of her lipid profile. Her lipid profile in April was markedly improved with an LDL particle #1594, LDL content 105, HDL 51 and triglycerides 142. She  reports some improvement in her shortness of breath and has managed to work on exercise and has had 12 pounds of weight loss.  I saw Breanna Sharp back in the office today. Overall she is doing well except she still has some shortness of breath despite weight loss, particularly when walking up inclines. We will not been able to really establish the reason for this. Heart rate did go up to a very high level with exercise, 94% predicted on cardiopulmonary exercise testing in 2014. Is not clear whether this is an arrhythmia or just sinus tachycardia. This could explain why she feels worse when walking up hills. I like to see if we can re-create this with exercise treadmill stress testing. If there is a sharp increase in heart rate with exercise, she may benefit from a low-dose beta blocker.  PMHx:  Past Medical History  Diagnosis Date  . Cervical cancer   . Hypertension   . Dyslipidemia     statin intolerance  . Vertigo     Past Surgical History  Procedure Laterality Date  . Abdominal hysterectomy  1972    cervical cancer    FAMHx:  Family History  Problem Relation Age of Onset  . Hypertension Mother     dx in her 31's  . Heart attack Mother 69  . Heart attack Brother 38  . Diabetes Brother     SOCHx:   reports that she quit smoking about 12 years ago. Her smoking use included Cigarettes. She quit after 32 years of use. She has never used smokeless tobacco. She reports that she drinks alcohol. She reports that she  does not use illicit drugs.  ALLERGIES:  Allergies  Allergen Reactions  . Statins     Muscle cramps    ROS: A comprehensive review of systems was negative except for: Respiratory: positive for dyspnea on exertion  HOME MEDS: Current Outpatient Prescriptions  Medication Sig Dispense Refill  . calcium carbonate (TUMS EX) 750 MG chewable tablet Chew 1 tablet by mouth daily.    . cholecalciferol (VITAMIN D) 1000 UNITS tablet Take 2,000 Units by mouth daily.     [name]  ezetimibe (ZETIA) 10 MG tablet Take 1 tablet (10 mg total) by mouth daily. 90 tablet 1  . fexofenadine (ALLEGRA) 180 MG tablet Take 1 tablet (180 mg total) by mouth daily. 90 tablet 3  . fluticasone (FLONASE) 50 MCG/ACT nasal spray Place 2 sprays into both nostrils 2 (two) times daily. 48 g 2  . lisinopril-hydrochlorothiazide (PRINZIDE,ZESTORETIC) 10-12.5 MG per tablet Take 1 tablet by mouth daily. 90 tablet 3  . Pitavastatin Calcium (LIVALO) 2 MG TABS Take 1 tablet (2 mg total) by mouth daily. 90 tablet 3  . Travoprost, BAK Free, (TRAVATAN) 0.004 % SOLN ophthalmic solution Place 1 drop into both eyes at bedtime.     No current facility-administered medications for this visit.    LABS/IMAGING: No results found for this or any previous visit (from the past 48 hour(s)). No results found.  VITALS: BP 132/76 mmHg  Pulse 77  Ht 5' 4.5" (1.638 m)  Wt 156 lb 9.6 oz (71.033 kg)  BMI 26.47 kg/m2  EXAM: General appearance: alert and no distress Neck: no carotid bruit and no JVD Lungs: clear to auscultation bilaterally Heart: regular rate and rhythm, S1, S2 normal, no murmur, click, rub or gallop Abdomen: soft, non-tender; bowel sounds normal; no masses,  no organomegaly Extremities: extremities normal, atraumatic, no cyanosis or edema Pulses: 2+ and symmetric Skin: Skin color, texture, turgor normal. No rashes or lesions Neurologic: Grossly normal  EKG: Sinus rhythm at 77  ASSESSMENT: 1. Dyslipidemia 2.   Hypertension 3.   DOE  PLAN: 1.   Breanna Sharp is describing shortness of breath when particular walking up hills. This is evaluated 2 years ago and she performed really well on cardio metabolic stress testing. Heart rate however was higher than predicted. This could be due to a steep heart rate response or possibly exercise-induced arrhythmia. I recommend a treadmill exercise stress test to try to see we can re-create her symptoms. This would be important to look at particularly on  inclines. She is also due for repeat lipid profile. We'll go and check that today. Plan to see her back annually or sooner as necessary should her testing be abnormal.  Pixie Casino, MD, [facility] Attending Cardiologist [place] 04/28/2015, 1:51 PM

## 2015-04-28 NOTE — Patient Instructions (Signed)
Your physician has requested that you have an exercise tolerance test. For further information please visit [email or web address]. Please also follow instruction sheet, as given.  Your physician recommends that you return for lab work FASTING  Your physician wants you to follow-up in: 1 year with Dr. Debara Pickett. You will receive a reminder letter in the mail two months in advance. If you don't receive a letter, please call our office to schedule the follow-up appointment.

## 2015-04-29 LAB — LIPID PANEL
Cholesterol: 176 mg/dL (ref 0–200)
HDL: 56 mg/dL (ref >=46)
LDL Cholesterol: 97 mg/dL (ref 0–99)
Total CHOL/HDL Ratio: 3.1 Ratio
Triglycerides: 117 mg/dL (ref <150)
VLDL: 23 mg/dL (ref 0–40)

## 2015-04-30 ENCOUNTER — Encounter: Payer: Self-pay | Admitting: *Deleted

## 2015-05-04 DIAGNOSIS — H40003 Preglaucoma, unspecified, bilateral: Secondary | ICD-10-CM | POA: Diagnosis not present

## 2015-05-04 DIAGNOSIS — H25013 Cortical age-related cataract, bilateral: Secondary | ICD-10-CM | POA: Diagnosis not present

## 2015-05-04 DIAGNOSIS — H43813 Vitreous degeneration, bilateral: Secondary | ICD-10-CM | POA: Diagnosis not present

## 2015-05-04 DIAGNOSIS — H18413 Arcus senilis, bilateral: Secondary | ICD-10-CM | POA: Diagnosis not present

## 2015-05-04 DIAGNOSIS — H25043 Posterior subcapsular polar age-related cataract, bilateral: Secondary | ICD-10-CM | POA: Diagnosis not present

## 2015-05-25 ENCOUNTER — Telehealth (HOSPITAL_COMMUNITY): Payer: Self-pay

## 2015-05-25 NOTE — Telephone Encounter (Signed)
Encounter complete. 

## 2015-05-27 ENCOUNTER — Ambulatory Visit (HOSPITAL_COMMUNITY)
Admission: RE | Admit: 2015-05-27 | Discharge: 2015-05-27 | Disposition: A | Discharge status: Home / Self Care | Payer: Medicare Other | Source: Ambulatory Visit | Attending: Internal Medicine | Admitting: Internal Medicine

## 2015-05-27 DIAGNOSIS — R0602 Shortness of breath: Secondary | ICD-10-CM | POA: Diagnosis not present

## 2015-05-27 DIAGNOSIS — R000 Tachycardia, unspecified: Secondary | ICD-10-CM | POA: Diagnosis not present

## 2015-05-27 LAB — EXERCISE TOLERANCE TEST
Estimated workload: 7 METS
Exercise duration (min): 6 min
MPHR: 155 {beats}/min
Peak HR: 157 {beats}/min
Percent HR: 101 %
RPE: 15
Rest HR: 86 {beats}/min

## 2015-06-03 ENCOUNTER — Other Ambulatory Visit: Payer: Self-pay | Admitting: *Deleted

## 2015-06-03 MED ORDER — EZETIMIBE 10 MG PO TABS: 10.0000 mg | ORAL_TABLET | Freq: Every day | ORAL | Status: DC

## 2015-07-03 ENCOUNTER — Ambulatory Visit (INDEPENDENT_AMBULATORY_CARE_PROVIDER_SITE_OTHER): Payer: Medicare Other | Admitting: Emergency Medicine

## 2015-07-03 VITALS — BP 122/78 | HR 83 | Temp 98.2°F | Ht 64.25 in | Wt 156.5 lb

## 2015-07-03 DIAGNOSIS — H6123 Impacted cerumen, bilateral: Secondary | ICD-10-CM | POA: Diagnosis not present

## 2015-07-03 DIAGNOSIS — J0140 Acute pansinusitis, unspecified: Secondary | ICD-10-CM | POA: Diagnosis not present

## 2015-07-03 DIAGNOSIS — R42 Dizziness and giddiness: Secondary | ICD-10-CM

## 2015-07-03 MED ORDER — MOMETASONE FUROATE 50 MCG/ACT NA SUSP: 2.0000 | Freq: Every day | NASAL | Status: DC

## 2015-07-03 NOTE — Patient Instructions (Signed)
Cerumen Impaction °A cerumen impaction is when the wax in your ear forms a plug. This plug usually causes reduced hearing. Sometimes it also causes an earache or dizziness. Removing a cerumen impaction can be difficult and painful. The wax sticks to the ear canal. The canal is sensitive and bleeds easily. If you try to remove a heavy wax buildup with a cotton tipped swab, you may push it in further. °Irrigation with water, suction, and small ear curettes may be used to clear out the wax. If the impaction is fixed to the skin in the ear canal, ear drops may be needed for a few days to loosen the wax. People who build up a lot of wax frequently can use ear wax removal products available in your local drugstore. °SEEK MEDICAL CARE IF:  °You develop an earache, increased hearing loss, or marked dizziness. °Document Released: 12/21/2004 Document Revised: 02/05/2012 Document Reviewed: 02/10/2010 °ExitCare® Patient Information ©2015 [employer]. This information is not intended to replace advice given to you by your health care provider. Make sure you discuss any questions you have with your health care provider. ° °

## 2015-07-03 NOTE — Progress Notes (Signed)
Subjective:  Patient ID: Breanna Sharp, female    DOB: 10-08-1951  Age: 64 y.o. MRN: [id number]  CC: Dizziness; Ear Fullness; and Sinus Problem   HPI [name] presents  with nasal congestion and postnasal drainage watery nasal discharge. She said over the last 2 weeks she been dizzy but has been improving and is not now currently concern with dizziness. She has pressure in her ears. Decreased hearing. Denies any fever chills nausea vomiting sore throat or cough. Program with over-the-counter medication  History Legna has a past medical history of Cervical cancer; Hypertension; Dyslipidemia; and Vertigo.   She has past surgical history that includes Abdominal hysterectomy (1972).   Her  family history includes Diabetes in her brother; Heart attack (age of onset: 36) in her brother; Heart attack (age of onset: 74) in her mother; Hypertension in her mother.  She   reports that she quit smoking about 12 years ago. Her smoking use included Cigarettes. She quit after 32 years of use. She has never used smokeless tobacco. She reports that she drinks alcohol. She reports that she does not use illicit drugs.  Outpatient Prescriptions Prior to Visit  Medication Sig Dispense Refill  . calcium carbonate (TUMS EX) 750 MG chewable tablet Chew 1 tablet by mouth daily.    . cholecalciferol (VITAMIN D) 1000 UNITS tablet Take 2,000 Units by mouth daily.     [name] ezetimibe (ZETIA) 10 MG tablet Take 1 tablet (10 mg total) by mouth daily. 90 tablet 3  . fexofenadine (ALLEGRA) 180 MG tablet Take 1 tablet (180 mg total) by mouth daily. 90 tablet 3  . fluticasone (FLONASE) 50 MCG/ACT nasal spray Place 2 sprays into both nostrils 2 (two) times daily. 48 g 2  . lisinopril-hydrochlorothiazide (PRINZIDE,ZESTORETIC) 10-12.5 MG per tablet Take 1 tablet by mouth daily. 90 tablet 3  . Pitavastatin Calcium (LIVALO) 2 MG TABS Take 1 tablet (2 mg total) by mouth daily. 90 tablet 3  . Travoprost, BAK Free,  (TRAVATAN) 0.004 % SOLN ophthalmic solution Place 1 drop into both eyes at bedtime.     No facility-administered medications prior to visit.    History   Social History  . Marital Status: Married    Spouse Name: [name]  . Number of Children: 0  . Years of Education: 12   Occupational History  .      Retired   Social History Main Topics  . Smoking status: Former Smoker -- 32 years    Types: Cigarettes    Quit date: 11/27/2002  . Smokeless tobacco: Never Used  . Alcohol Use: 0.0 oz/week     Comment: social - 2 or 3 times a month 1-2 glasses of wine  . Drug Use: No  . Sexual Activity:    Partners: Male   Other Topics Concern  . None   Social History Narrative   Exercise 3 to 4 times/week walking for 45 min -1 hour   Patient lives at home with her husband Careers adviser).   Patient  Is retired   [employer] person school.   Right handed.   Caffeine Green Tea.     Review of Systems  Constitutional: Negative for fever, chills and appetite change.  HENT: Negative for congestion, ear pain, postnasal drip, sinus pressure and sore throat.   Eyes: Negative for pain and redness.  Respiratory: Negative for cough, shortness of breath and wheezing.   Cardiovascular: Negative for leg swelling.  Gastrointestinal: Negative for nausea, vomiting, abdominal pain, diarrhea, constipation  and blood in stool.  Endocrine: Negative for polyuria.  Genitourinary: Negative for dysuria, urgency, frequency and flank pain.  Musculoskeletal: Negative for gait problem.  Skin: Negative for rash.  Neurological: Negative for weakness and headaches.  Psychiatric/Behavioral: Negative for confusion and decreased concentration. The patient is not nervous/anxious.     Objective:  BP 122/78 mmHg  Pulse 83  Temp(Src) 98.2 F (36.8 C) (Oral)  Ht 5' 4.25" (1.632 m)  Wt 156 lb 8 oz (70.988 kg)  BMI 26.65 kg/m2  SpO2 98%  Physical Exam  Constitutional: She is oriented to person, place, and time. She appears  well-developed and well-nourished.  HENT:  Head: Normocephalic and atraumatic.  Right Ear: A foreign body is present.  Left Ear: A foreign body is present.  Eyes: Conjunctivae are normal. Pupils are equal, round, and reactive to light.  Pulmonary/Chest: Effort normal.  Musculoskeletal: She exhibits no edema.  Neurological: She is alert and oriented to person, place, and time.  Skin: Skin is dry.  Psychiatric: She has a normal mood and affect. Her behavior is normal. Thought content normal.   bilateral cerumen ex excess    Assessment & Plan:   Jacy was seen today for dizziness, ear fullness and sinus problem.  Diagnoses and all orders for this visit:  Acute pansinusitis, recurrence not specified  Vertigo  Cerumen debris on tympanic membrane, bilateral   I am having Ms. Sharlett Iles maintain her cholecalciferol, fexofenadine, calcium carbonate, Travoprost (BAK Free), fluticasone, lisinopril-hydrochlorothiazide, Pitavastatin Calcium, and ezetimibe.  No orders of the defined types were placed in this encounter.   Ears were irrigated atraumatically with removal of the wax impaction. She was instructed to continue taking her medication for dizziness and follow-up with her ENT doctor as needed.  Appropriate red flag conditions were discussed with the patient as well as actions that should be taken.  Patient expressed his understanding.  Follow-up: Return if symptoms worsen or fail to improve.  Roselee Culver, MD

## 2015-07-06 ENCOUNTER — Other Ambulatory Visit: Payer: Self-pay

## 2015-07-06 MED ORDER — MOMETASONE FUROATE 50 MCG/ACT NA SUSP: 2.0000 | Freq: Every day | NASAL | Status: DC

## 2015-07-16 ENCOUNTER — Other Ambulatory Visit: Payer: Self-pay

## 2015-07-16 DIAGNOSIS — Z1231 Encounter for screening mammogram for malignant neoplasm of breast: Secondary | ICD-10-CM

## 2015-08-24 ENCOUNTER — Ambulatory Visit
Admission: RE | Admit: 2015-08-24 | Discharge: 2015-08-24 | Disposition: A | Discharge status: Home / Self Care | Payer: Medicare Other | Source: Ambulatory Visit

## 2015-08-24 DIAGNOSIS — Z1231 Encounter for screening mammogram for malignant neoplasm of breast: Secondary | ICD-10-CM

## 2015-09-01 ENCOUNTER — Encounter: Payer: Self-pay | Admitting: Internal Medicine

## 2015-09-01 ENCOUNTER — Ambulatory Visit (INDEPENDENT_AMBULATORY_CARE_PROVIDER_SITE_OTHER): Payer: Medicare Other

## 2015-09-01 ENCOUNTER — Ambulatory Visit (INDEPENDENT_AMBULATORY_CARE_PROVIDER_SITE_OTHER): Payer: Medicare Other | Admitting: Internal Medicine

## 2015-09-01 VITALS — BP 143/70 | HR 86 | Temp 98.6°F | Resp 16 | Ht 64.5 in | Wt 158.8 lb

## 2015-09-01 DIAGNOSIS — R0609 Other forms of dyspnea: Secondary | ICD-10-CM

## 2015-09-01 DIAGNOSIS — I10 Essential (primary) hypertension: Secondary | ICD-10-CM

## 2015-09-01 DIAGNOSIS — Z0000 Encounter for general adult medical examination without abnormal findings: Secondary | ICD-10-CM | POA: Diagnosis not present

## 2015-09-01 DIAGNOSIS — E785 Hyperlipidemia, unspecified: Secondary | ICD-10-CM | POA: Diagnosis not present

## 2015-09-01 DIAGNOSIS — R0600 Dyspnea, unspecified: Secondary | ICD-10-CM

## 2015-09-01 DIAGNOSIS — Z23 Encounter for immunization: Secondary | ICD-10-CM | POA: Diagnosis not present

## 2015-09-01 DIAGNOSIS — Z780 Asymptomatic menopausal state: Secondary | ICD-10-CM

## 2015-09-01 LAB — CBC WITH DIFFERENTIAL/PLATELET
Basophils Absolute: 0 10*3/uL (ref 0.0–0.1)
Basophils Relative: 0 % (ref 0–1)
Eosinophils Absolute: 0.1 10*3/uL (ref 0.0–0.7)
Eosinophils Relative: 2 % (ref 0–5)
HCT: 37.2 % (ref 36.0–46.0)
Hemoglobin: 12.2 g/dL (ref 12.0–15.0)
Lymphocytes Relative: 38 % (ref 12–46)
Lymphs Abs: 2 10*3/uL (ref 0.7–4.0)
MCH: 27.9 pg (ref 26.0–34.0)
MCHC: 32.8 g/dL (ref 30.0–36.0)
MCV: 84.9 fL (ref 78.0–100.0)
MPV: 10.5 fL (ref 8.6–12.4)
Monocytes Absolute: 0.5 10*3/uL (ref 0.1–1.0)
Monocytes Relative: 9 % (ref 3–12)
Neutro Abs: 2.7 10*3/uL (ref 1.7–7.7)
Neutrophils Relative %: 51 % (ref 43–77)
Platelets: 235 10*3/uL (ref 150–400)
RBC: 4.38 MIL/uL (ref 3.87–5.11)
RDW: 13.7 % (ref 11.5–15.5)
WBC: 5.2 10*3/uL (ref 4.0–10.5)

## 2015-09-01 LAB — COMPREHENSIVE METABOLIC PANEL
ALT: 13 U/L (ref 6–29)
AST: 15 U/L (ref 10–35)
Albumin: 4.2 g/dL (ref 3.6–5.1)
Alkaline Phosphatase: 62 U/L (ref 33–130)
BUN: 17 mg/dL (ref 7–25)
CO2: 27 mmol/L (ref 20–31)
Calcium: 9.1 mg/dL (ref 8.6–10.4)
Chloride: 107 mmol/L (ref 98–110)
Creat: 0.8 mg/dL (ref 0.50–0.99)
Glucose, Bld: 110 mg/dL — ABNORMAL HIGH (ref 65–99)
Potassium: 3.9 mmol/L (ref 3.5–5.3)
Sodium: 139 mmol/L (ref 135–146)
Total Bilirubin: 0.5 mg/dL (ref 0.2–1.2)
Total Protein: 6.2 g/dL (ref 6.1–8.1)

## 2015-09-01 LAB — LIPID PANEL
Cholesterol: 155 mg/dL (ref 125–200)
HDL: 57 mg/dL (ref >=46)
LDL Cholesterol: 79 mg/dL (ref <130)
Total CHOL/HDL Ratio: 2.7 Ratio (ref <=5.0)
Triglycerides: 93 mg/dL (ref <150)
VLDL: 19 mg/dL (ref <30)

## 2015-09-01 NOTE — Progress Notes (Signed)
Subjective:    Patient ID: Breanna Sharp, female    DOB: 1951/07/06, 64 y.o.   MRN: [id number]  HPI annual exam Patient Active Problem List   Diagnosis Date Noted  . Benign essential HTN 08/14/2012    Priority: Medium  . DOE (dyspnea on exertion) 05/02/2013  . Chronic sinusitis 05/02/2013  . Vertigo 08/14/2012  . BMI 28.0-28.9,adult 08/14/2012   She has now completed cardiac evaluation following referral for fatigue and dyspnea on exertion. Cardiac status is considered normal. Her shortness of breath with activity has increased over the last year and does affect daily activity. No associated chest pain or palpitation. There is a significant past history: Smoked 21yrs See cxr 2014-?emphy She stopped smoking 10 years ago No history of asthma recent Positive history for allergic rhinitis  Mammo-last month within normal limits No recent weight gain Wt Readings from Last 3 Encounters:  09/01/15 158 lb 12.8 oz (72.031 kg)  07/03/15 156 lb 8 oz (70.988 kg)  04/28/15 156 lb 9.6 oz (71.033 kg)   Flusht colonos #2 2008 [place]  Review of Systems 14 point review of system performed within normal limits except that noted in present illness    Objective:   Physical Exam  Constitutional: She is oriented to person, place, and time. She appears well-developed and well-nourished. No distress.  HENT:  Head: Normocephalic.  Right Ear: External ear normal.  Left Ear: External ear normal.  Nose: Nose normal.  Mouth/Throat: Oropharynx is clear and moist.  Mild allergic nares  Eyes: Conjunctivae and EOM are normal. Pupils are equal, round, and reactive to light.  Neck: Normal range of motion. Neck supple. No thyromegaly present.  Cardiovascular: Normal rate, regular rhythm, normal heart sounds and intact distal pulses.   No murmur heard. Pulmonary/Chest: Effort normal and breath sounds normal. She has no wheezes.  There is some mild delay in the expiratory phase and there is shallow  intake  Abdominal: Soft. Bowel sounds are normal. She exhibits no distension and no mass. There is no tenderness. There is no rebound.  Musculoskeletal: Normal range of motion. She exhibits no edema or tenderness.  Lymphadenopathy:    She has no cervical adenopathy.  Neurological: She is alert and oriented to person, place, and time. She has normal reflexes. No cranial nerve deficit.  Skin: Skin is warm and dry. No rash noted.  Psychiatric: She has a normal mood and affect. Her behavior is normal. Judgment and thought content normal.  Nursing note and vitals reviewed.  BP 143/70 mmHg  Pulse 86  Temp(Src) 98.6 F (37 C) (Oral)  Resp 16  Ht 5' 4.5" (1.638 m)  Wt 158 lb 12.8 oz (72.031 kg)  BMI 26.85 kg/m2  Pulmonary function studies reveal moderate obstruction but the maneuvers and not reproducible with repetition in this interpretation guarded this study is concerning for possible obstructive disease    [facility] reading (PRIMARY) by  Dr. Laney Pastor  Unclear to me if she has Emphysema on this xray as suggested 2 y ago. This x-ray appears normal     Assessment & Plan:  Routine general medical examination at a health care facility  Need for prophylactic vaccination and inoculation against influenza - Plan: Flu Vaccine QUAD 36+ mos IM  Post-menopausal - Plan: DG Bone Density  Benign essential HTN - Plan: CBC with Differential/Platelet, Comprehensive metabolic panel  DOE (dyspnea on exertion) - Plan: DG Chest 2 View, Ambulatory referral to Pulmonology  Hyperlipidemia - Plan: Lipid panel  Obstructive sleep apnea  with irregular use of CPAP  No orders of the defined types were placed in this encounter.   she may call for routine follow-ups  Her cardiac status sound they had problems because of her dyspnea on exertion. No chest pain or diaphoresis-cardiac evaluation normal I believe she needs full evaluation by pulmonary ? with formal pulmonary function tests We may have to restrict  her work in the process of being evaluated and treated No change in other medication Chronic pain meds refilled

## 2015-09-02 ENCOUNTER — Encounter: Payer: Self-pay | Admitting: Internal Medicine

## 2015-09-07 ENCOUNTER — Other Ambulatory Visit: Payer: Self-pay

## 2015-09-07 DIAGNOSIS — E2839 Other primary ovarian failure: Secondary | ICD-10-CM

## 2015-09-16 ENCOUNTER — Ambulatory Visit (INDEPENDENT_AMBULATORY_CARE_PROVIDER_SITE_OTHER): Payer: Medicare Other | Admitting: Pulmonary Disease

## 2015-09-16 ENCOUNTER — Encounter: Payer: Self-pay | Admitting: Pulmonary Disease

## 2015-09-16 VITALS — BP 132/68 | HR 73 | Temp 98.7°F | Ht 64.5 in | Wt 158.2 lb

## 2015-09-16 DIAGNOSIS — R0609 Other forms of dyspnea: Secondary | ICD-10-CM | POA: Diagnosis not present

## 2015-09-16 DIAGNOSIS — R0600 Dyspnea, unspecified: Secondary | ICD-10-CM

## 2015-09-16 MED ORDER — TIOTROPIUM BROMIDE MONOHYDRATE 2.5 MCG/ACT IN AERS: 2.0000 | INHALATION_SPRAY | Freq: Every day | RESPIRATORY_TRACT | Status: DC

## 2015-09-16 MED ORDER — ALBUTEROL SULFATE HFA 108 (90 BASE) MCG/ACT IN AERS: 2.0000 | INHALATION_SPRAY | Freq: Four times a day (QID) | RESPIRATORY_TRACT | Status: DC | PRN

## 2015-09-16 NOTE — Progress Notes (Signed)
Subjective:    Patient ID: Breanna Sharp, female    DOB: 1951/08/17, 64 y.o.   MRN: [id number]  HPI Consult for evaluation of dyspnea.  Breanna Sharp is a 64 year old with past medical history of hypertension. She complains of dyspnea on exertion for the past 2 years. His symptoms mostly occur when she is walking up an incline or climbing stairs. She usually gets winded after 1 flight of stairs. She does not have any associated wheeze, cough, sputum production. The symptoms do not occur at rest. She was evaluated with a cardiopulmonary exercise test on 04/22/13. The results showed good exercise capacity. The peak VO2 of 108% of predicted. There are no obvious pulmonary abnormalities. She had a good breathing reserve.  She denies any cough, sputum production, wheezing. She not have any palpitations, chest pain. She smoked about 1 pack per day for 30 years and quit in 2001. Never had any lung function tests performed.  Data  CXR (09/01/15) No active cardiopulmonary disease.  PFTs 04/22/13 FVC 1.91 (73%) FEV1 1.44 (70%) F/F 75 SVC 2.05 (78%) DLCO 66%  Past Medical History  Diagnosis Date  . Hypertension   . Dyslipidemia     statin intolerance  . Vertigo   . Arthritis     Current outpatient prescriptions:  .  calcium carbonate (TUMS EX) 750 MG chewable tablet, Chew 1 tablet by mouth daily., Disp: , Rfl:  .  cholecalciferol (VITAMIN D) 1000 UNITS tablet, Take 2,000 Units by mouth daily. , Disp: , Rfl:  .  ezetimibe (ZETIA) 10 MG tablet, Take 1 tablet (10 mg total) by mouth daily., Disp: 90 tablet, Rfl: 3 .  fexofenadine (ALLEGRA) 180 MG tablet, Take 1 tablet (180 mg total) by mouth daily., Disp: 90 tablet, Rfl: 3 .  fluticasone (FLONASE) 50 MCG/ACT nasal spray, Place 2 sprays into both nostrils 2 (two) times daily., Disp: 48 g, Rfl: 2 .  lisinopril-hydrochlorothiazide (PRINZIDE,ZESTORETIC) 10-12.5 MG per tablet, Take 1 tablet by mouth daily., Disp: 90 tablet, Rfl: 3 .   Pitavastatin Calcium (LIVALO) 2 MG TABS, Take 1 tablet (2 mg total) by mouth daily., Disp: 90 tablet, Rfl: 3 .  Travoprost, BAK Free, (TRAVATAN) 0.004 % SOLN ophthalmic solution, Place 1 drop into both eyes at bedtime., Disp: , Rfl:  .  albuterol (PROAIR HFA) 108 (90 BASE) MCG/ACT inhaler, Inhale 2 puffs into the lungs every 6 (six) hours as needed for wheezing or shortness of breath., Disp: 8 g, Rfl: 0 .  Tiotropium Bromide Monohydrate (SPIRIVA RESPIMAT) 2.5 MCG/ACT AERS, Inhale 2 puffs into the lungs daily., Disp: 4 g, Rfl: 0   Review of Systems Has dyspnea on exertion, not rest. No cough, sputum production, wheezing, hemoptysis. No chest pain, palpitations. No nausea, vomiting, diarrhea, constipation. All other review of systems are negative    Objective:   Physical Exam Blood pressure 132/68, pulse 73, temperature 98.7 F (37.1 C), temperature source Oral, height 5' 4.5" (1.638 m), weight 158 lb 3.2 oz (71.759 kg), SpO2 97 %.  Gen.: No apparent distress Neuro: No gross focal deficits. Neck: No JVD, lymphadenopathy, thyromegaly. RS: Clear, No wheeze or crackles. CVS: S1-S2 heard, no murmurs rubs gallops. Abdomen: Soft, positive bowel sounds. Extremities: No edema.    Assessment & Plan:   Dyspnea on exertion. This is of unclear etiology. She's had cardio pulmonary exercise test in 2014 which was entirely normal she exceeded her predicted VO2 max. She had adequate breathing reserve. There is no indication of any pulmonary impairment to  exercise at that point. She does have extensive smoking history and probably has COPD. Her PFTs in 2014 did not reveal any obstruction but we will repeat this to get more recent assessment. I'll start her on Spiriva.  She'll return to clinic in 1-2 months to assess response to therapy and to review the results of her lung function tests. If these tests are unrevealing (consider echocardiogram to evaluate for pulmonary hypertension.  Plan: - Check  PFTs - Start Spiriva.  Marshell Garfinkel MD [facility] Pager [phone] If no answer or after 3pm call: [phone] 09/16/2015, 5:44 PM

## 2015-09-16 NOTE — Patient Instructions (Signed)
We will get pulmonary function tests Start spiriva inhaler  Return to clinic in 3 months.

## 2015-09-20 ENCOUNTER — Telehealth: Payer: Self-pay | Admitting: Pulmonary Disease

## 2015-09-20 MED ORDER — TIOTROPIUM BROMIDE MONOHYDRATE 2.5 MCG/ACT IN AERS: 2.0000 | INHALATION_SPRAY | Freq: Every day | RESPIRATORY_TRACT | Status: DC

## 2015-09-20 NOTE — Telephone Encounter (Signed)
Called spoke with pt. She [facility] i have done so. Nothing further needed

## 2015-10-01 ENCOUNTER — Telehealth: Payer: Self-pay

## 2015-10-01 ENCOUNTER — Ambulatory Visit
Admission: RE | Admit: 2015-10-01 | Discharge: 2015-10-01 | Disposition: A | Discharge status: Home / Self Care | Payer: Medicare Other | Source: Ambulatory Visit | Attending: Internal Medicine | Admitting: Internal Medicine

## 2015-10-01 DIAGNOSIS — E2839 Other primary ovarian failure: Secondary | ICD-10-CM

## 2015-10-01 DIAGNOSIS — Z13820 Encounter for screening for osteoporosis: Secondary | ICD-10-CM | POA: Diagnosis not present

## 2015-10-01 DIAGNOSIS — M8580 Other specified disorders of bone density and structure, unspecified site: Secondary | ICD-10-CM

## 2015-10-01 NOTE — Telephone Encounter (Addendum)
Pt states that she would like to talk with dr Laney Pastor about her glucose being elevated at 110  She is wanting to make sure that zetia or livalo is not causing this and also she had her mammorgram and her and dr Laney Pastor stated that it was fine but she received a letter from the breast center that she needed to discuss things with provider about her density of the breast she wants to make sure everything is fine   Best number [phone]

## 2015-10-04 NOTE — Telephone Encounter (Signed)
With our testing that level of blood sugar is fine and her meds are not a problem. The breast center was simply saying that her normal tissue is dense and that this makes the mammogram harder to interpret--but she is so faithful at doing this yearly that they will detect any problem

## 2015-10-05 NOTE — Telephone Encounter (Signed)
Spoke with pt, informed message. Pt understood

## 2015-10-05 NOTE — Telephone Encounter (Signed)
She would also like to get her glucose checked every 6 months, if this is ok.

## 2015-10-05 NOTE — Telephone Encounter (Signed)
definitely

## 2015-10-12 DIAGNOSIS — M8580 Other specified disorders of bone density and structure, unspecified site: Secondary | ICD-10-CM | POA: Insufficient documentation

## 2015-10-15 ENCOUNTER — Telehealth: Payer: Self-pay

## 2015-10-15 DIAGNOSIS — E559 Vitamin D deficiency, unspecified: Secondary | ICD-10-CM

## 2015-10-15 DIAGNOSIS — M8580 Other specified disorders of bone density and structure, unspecified site: Secondary | ICD-10-CM

## 2015-10-15 DIAGNOSIS — E2839 Other primary ovarian failure: Secondary | ICD-10-CM

## 2015-10-15 NOTE — Telephone Encounter (Signed)
That should be fine for to her take.    This is what I give to patients about calcium. In order for your supplemental calcium to be effective.  Please take calcium either on an empty stomach or with food that does not contain calcium.  Your body is able only to absorb 500mg  of Calcium at a time from any one source.  Do not take with a MVI with iron because iron does not allow th calcium to be absorbed.  Please take Calcium citrate 500mg  2-3x/day.  This supplement should have Vit D in it and if your pills do not please add addition Vit D 400 IU with each dose.  If she takes her Calcium tid that will give there 1200 Vit D - we have never done a Vit D on her but she could do this and then at her next appointment we could check a vit D and add if needed.

## 2015-10-15 NOTE — Telephone Encounter (Signed)
Patient was left a message to take calcium 600 mg. She is already taking vitamin D 2000 IU. She is unable to find calcium without vitamin D in it. She needs clarification on what to do because she doesn't want to take too much vitamin D. The calcium that she found has 400 mg of vitamin D, and that would have her taking 2400 IU of vitamin D. Please call back at [phone].

## 2015-10-18 NOTE — Telephone Encounter (Signed)
She is correct that she is taking plenty of Vit d For Ca she has a lot more leeway and could simply chew 2 tums a day(400mg ) Glad to measure D if she wishes--could do future order

## 2015-10-18 NOTE — Telephone Encounter (Signed)
Spoke with pt, now we both are confused. Pt has not had a vitamin d in a while can we put in an order for labs only to check this? I feel we can dose her accordingly if we have her value. Please advise.

## 2015-10-22 NOTE — Telephone Encounter (Signed)
Spoke with pt, advised message. Pt understood. 

## 2015-10-22 NOTE — Telephone Encounter (Signed)
Dr. Laney Pastor, it wont accept the codes. Does she have oseteoporosis. I did not know what her bone density result was saying.

## 2015-10-22 NOTE — Telephone Encounter (Signed)
Osteopenia only

## 2015-10-25 ENCOUNTER — Other Ambulatory Visit (INDEPENDENT_AMBULATORY_CARE_PROVIDER_SITE_OTHER): Payer: Medicare Other

## 2015-10-25 ENCOUNTER — Encounter: Payer: Self-pay | Admitting: Internal Medicine

## 2015-10-25 DIAGNOSIS — M8580 Other specified disorders of bone density and structure, unspecified site: Secondary | ICD-10-CM

## 2015-10-25 DIAGNOSIS — E559 Vitamin D deficiency, unspecified: Secondary | ICD-10-CM | POA: Diagnosis not present

## 2015-10-26 LAB — VITAMIN D 25 HYDROXY (VIT D DEFICIENCY, FRACTURES): Vit D, 25-Hydroxy: 25 ng/mL — ABNORMAL LOW (ref 30–100)

## 2015-11-05 ENCOUNTER — Other Ambulatory Visit: Payer: Self-pay

## 2015-11-05 MED ORDER — FLUTICASONE PROPIONATE 50 MCG/ACT NA SUSP: 2.0000 | Freq: Two times a day (BID) | NASAL | Status: DC

## 2015-11-09 DIAGNOSIS — H25013 Cortical age-related cataract, bilateral: Secondary | ICD-10-CM | POA: Diagnosis not present

## 2015-11-09 DIAGNOSIS — H401131 Primary open-angle glaucoma, bilateral, mild stage: Secondary | ICD-10-CM | POA: Diagnosis not present

## 2015-11-09 DIAGNOSIS — H18413 Arcus senilis, bilateral: Secondary | ICD-10-CM | POA: Diagnosis not present

## 2015-11-09 DIAGNOSIS — H43813 Vitreous degeneration, bilateral: Secondary | ICD-10-CM | POA: Diagnosis not present

## 2015-11-09 DIAGNOSIS — H25043 Posterior subcapsular polar age-related cataract, bilateral: Secondary | ICD-10-CM | POA: Diagnosis not present

## 2015-12-17 ENCOUNTER — Ambulatory Visit (INDEPENDENT_AMBULATORY_CARE_PROVIDER_SITE_OTHER): Payer: Medicare Other | Admitting: Pulmonary Disease

## 2015-12-17 ENCOUNTER — Encounter: Payer: Self-pay | Admitting: Pulmonary Disease

## 2015-12-17 VITALS — BP 124/68 | HR 80 | Ht 64.5 in | Wt 162.0 lb

## 2015-12-17 DIAGNOSIS — R0600 Dyspnea, unspecified: Secondary | ICD-10-CM

## 2015-12-17 DIAGNOSIS — R0609 Other forms of dyspnea: Secondary | ICD-10-CM

## 2015-12-17 LAB — PULMONARY FUNCTION TEST
DL/VA % pred: 98 %
DL/VA: 4.78 ml/min/mmHg/L
DLCO unc % pred: 68 %
DLCO unc: 17.16 ml/min/mmHg
FEF 25-75 Post: 1.66 L/sec
FEF 25-75 Pre: 1.47 L/sec
FEF2575-%Change-Post: 12 %
FEF2575-%Pred-Post: 87 %
FEF2575-%Pred-Pre: 77 %
FEV1-%Change-Post: 2 %
FEV1-%Pred-Post: 87 %
FEV1-%Pred-Pre: 85 %
FEV1-Post: 1.74 L
FEV1-Pre: 1.7 L
FEV1FVC-%Change-Post: 0 %
FEV1FVC-%Pred-Pre: 101 %
FEV6-%Change-Post: 2 %
FEV6-%Pred-Post: 88 %
FEV6-%Pred-Pre: 86 %
FEV6-Post: 2.18 L
FEV6-Pre: 2.13 L
FEV6FVC-%Change-Post: 0 %
FEV6FVC-%Pred-Post: 103 %
FEV6FVC-%Pred-Pre: 103 %
FVC-%Change-Post: 1 %
FVC-%Pred-Post: 85 %
FVC-%Pred-Pre: 84 %
FVC-Post: 2.19 L
FVC-Pre: 2.15 L
Post FEV1/FVC ratio: 80 %
Post FEV6/FVC ratio: 100 %
Pre FEV1/FVC ratio: 79 %
Pre FEV6/FVC Ratio: 99 %
RV % pred: 69 %
RV: 1.48 L
TLC % pred: 93 %
TLC: 4.81 L

## 2015-12-17 MED ORDER — TIOTROPIUM BROMIDE MONOHYDRATE 2.5 MCG/ACT IN AERS: 2.0000 | INHALATION_SPRAY | Freq: Every day | RESPIRATORY_TRACT | Status: DC

## 2015-12-17 NOTE — Addendum Note (Signed)
Addended by: Parke Poisson E on: 12/17/2015 04:43 PM   Modules accepted: Orders

## 2015-12-17 NOTE — Progress Notes (Signed)
Subjective:    Patient ID: Breanna Sharp, female    DOB: February 09, 1951, 65 y.o.   MRN: [id number]  HPI Breanna Sharp is a 65 year old with past medical history of hypertension. Breanna Sharp is here for follow up of dyspnea on exertion for the past 2 years. His symptoms mostly occur when Breanna Sharp is walking up an incline or climbing stairs. Breanna Sharp usually gets winded after 1 flight of stairs. Breanna Sharp does not have any associated wheeze, cough, sputum production. The symptoms do not occur at rest. Breanna Sharp was evaluated with a cardiopulmonary exercise test on 04/22/13. The results showed good exercise capacity. The peak VO2 of 108% of predicted. There are no obvious pulmonary abnormalities. Breanna Sharp had a good breathing reserve. Breanna Sharp was started on Spiriva last visit and reports improvement in her symptoms.  Breanna Sharp denies any cough, sputum production, wheezing. Breanna Sharp not have any palpitations, chest pain.   Data  CXR (09/01/15) No active cardiopulmonary disease.  PFTs 04/22/13 FVC 1.91 (73%) FEV1 1.44 (70%) F/F 75 SVC 2.05 (78%) DLCO 66%  PFTs 12/17/15 FVC 2.15 [84%) FEV1 1.70 (85%] F/F 79 TLC 93% DLCO 68%  Social History: Breanna Sharp smoked about 1 pack per day for 30 years and quit in 2001.  Breanna Sharp worked at [employer]. Breanna Sharp is now retired. There are no exposures at work or at home.  Family history: Mother, Brother - Coronary artery disease  Past Medical History  Diagnosis Date  . Hypertension   . Dyslipidemia     statin intolerance  . Vertigo   . Arthritis     Current outpatient prescriptions:  .  calcium carbonate (TUMS EX) 750 MG chewable tablet, Chew 1 tablet by mouth daily., Disp: , Rfl:  .  cholecalciferol (VITAMIN D) 1000 UNITS tablet, Take 2,000 Units by mouth daily. , Disp: , Rfl:  .  ezetimibe (ZETIA) 10 MG tablet, Take 1 tablet (10 mg total) by mouth daily., Disp: 90 tablet, Rfl: 3 .  fexofenadine (ALLEGRA) 180 MG tablet, Take 1 tablet (180 mg total) by mouth daily., Disp: 90 tablet, Rfl: 3 .   fluticasone (FLONASE) 50 MCG/ACT nasal spray, Place 2 sprays into both nostrils 2 (two) times daily., Disp: 48 g, Rfl: 2 .  lisinopril-hydrochlorothiazide (PRINZIDE,ZESTORETIC) 10-12.5 MG per tablet, Take 1 tablet by mouth daily., Disp: 90 tablet, Rfl: 3 .  Pitavastatin Calcium (LIVALO) 2 MG TABS, Take 1 tablet (2 mg total) by mouth daily., Disp: 90 tablet, Rfl: 3 .  Tiotropium Bromide Monohydrate (SPIRIVA RESPIMAT) 2.5 MCG/ACT AERS, Inhale 2 puffs into the lungs daily., Disp: 3 Inhaler, Rfl: 1 .  Travoprost, BAK Free, (TRAVATAN) 0.004 % SOLN ophthalmic solution, Place 1 drop into both eyes at bedtime., Disp: , Rfl:  .  albuterol (PROAIR HFA) 108 (90 BASE) MCG/ACT inhaler, Inhale 2 puffs into the lungs every 6 (six) hours as needed for wheezing or shortness of breath. (Patient not taking: Reported on 12/17/2015), Disp: 8 g, Rfl: 0   Review of Systems Has dyspnea on exertion, not rest. No cough, sputum production, wheezing, hemoptysis. No chest pain, palpitations. No nausea, vomiting, diarrhea, constipation. All other review of systems are negative    Objective:   Physical Exam Blood pressure 124/68, pulse 80, height 5' 4.5" (1.638 m), weight 162 lb (73.483 kg), SpO2 97 %.  Gen.: No apparent distress Neuro: No gross focal deficits. Neck: No JVD, lymphadenopathy, thyromegaly. RS: Clear, No wheeze or crackles. CVS: S1-S2 heard, no murmurs rubs gallops. Abdomen: Soft, positive bowel sounds. Extremities: No edema.  Assessment & Plan:   Dyspnea on exertion. Likely secondary to COPD. Although her PFTs do not show any overt obstruction by F/F criteria Breanna Sharp does have reduction in mid flow rate and flow volume loop suggestive of mild obstruction. Breanna Sharp does have extensive smoking history. Breanna Sharp has responded well to Spiriva and we will continue same. Breanna Sharp's had cardio pulmonary exercise test in 2014 which was entirely normal Breanna Sharp exceeded her predicted VO2 max. Breanna Sharp had adequate breathing reserve. There  is no indication of any pulmonary impairment to exercise at that point.  Breanna Sharp does have reduction in DLCO which however corrects for alveolar volume. I will get an echocardiogram to make sure Breanna Sharp doesn't have any pulmonary hypertension.  Plan: - Echocardiogram - Continue Spiriva.  Marshell Garfinkel MD [facility] Pager [phone] If no answer or after 3pm call: [phone] 12/17/2015, 1:51 PM

## 2015-12-17 NOTE — Progress Notes (Signed)
PFT done today. 

## 2015-12-17 NOTE — Patient Instructions (Signed)
We will schedule you for an echocardiogram. Continue using the Spiriva.  Return to clinic in 6 months

## 2015-12-29 ENCOUNTER — Ambulatory Visit (HOSPITAL_COMMUNITY): Payer: Medicare Other | Attending: Cardiovascular Disease

## 2015-12-29 ENCOUNTER — Other Ambulatory Visit: Payer: Self-pay

## 2015-12-29 DIAGNOSIS — Z87891 Personal history of nicotine dependence: Secondary | ICD-10-CM | POA: Insufficient documentation

## 2015-12-29 DIAGNOSIS — E785 Hyperlipidemia, unspecified: Secondary | ICD-10-CM | POA: Insufficient documentation

## 2015-12-29 DIAGNOSIS — I10 Essential (primary) hypertension: Secondary | ICD-10-CM | POA: Diagnosis not present

## 2015-12-29 DIAGNOSIS — R0600 Dyspnea, unspecified: Secondary | ICD-10-CM

## 2015-12-29 DIAGNOSIS — R0609 Other forms of dyspnea: Secondary | ICD-10-CM | POA: Diagnosis not present

## 2015-12-29 DIAGNOSIS — I351 Nonrheumatic aortic (valve) insufficiency: Secondary | ICD-10-CM | POA: Diagnosis not present

## 2016-01-07 ENCOUNTER — Telehealth: Payer: Self-pay | Admitting: Pulmonary Disease

## 2016-01-07 NOTE — Telephone Encounter (Signed)
Notes Recorded by Marshell Garfinkel, MD on 01/06/2016 at 12:09 PM Please let the pt know that the echocardiogram is normal. ---  I spoke with patient about results and she verbalized understanding and had no questions

## 2016-02-23 ENCOUNTER — Ambulatory Visit: Admitting: Neurology

## 2016-04-10 ENCOUNTER — Other Ambulatory Visit: Payer: Self-pay | Admitting: Internal Medicine

## 2016-04-10 NOTE — Telephone Encounter (Signed)
Rx request sent to pharmacy.  

## 2016-05-11 ENCOUNTER — Encounter: Payer: Self-pay | Admitting: Internal Medicine

## 2016-05-11 ENCOUNTER — Ambulatory Visit (INDEPENDENT_AMBULATORY_CARE_PROVIDER_SITE_OTHER): Payer: Medicare Other | Admitting: Internal Medicine

## 2016-05-11 VITALS — BP 133/87 | HR 79 | Ht 64.5 in | Wt 160.0 lb

## 2016-05-11 DIAGNOSIS — E785 Hyperlipidemia, unspecified: Secondary | ICD-10-CM

## 2016-05-11 DIAGNOSIS — R0602 Shortness of breath: Secondary | ICD-10-CM

## 2016-05-11 DIAGNOSIS — I10 Essential (primary) hypertension: Secondary | ICD-10-CM | POA: Insufficient documentation

## 2016-05-11 MED ORDER — EZETIMIBE 10 MG PO TABS: 10.0000 mg | ORAL_TABLET | Freq: Every day | ORAL | Status: DC

## 2016-05-11 MED ORDER — PITAVASTATIN CALCIUM 2 MG PO TABS: 1.0000 | ORAL_TABLET | Freq: Every day | ORAL | Status: DC

## 2016-05-11 NOTE — Progress Notes (Signed)
OFFICE NOTE  Chief Complaint:  Annual follow-up  Primary Care Physician: Leandrew Koyanagi, MD  HPI:  Breanna Sharp is a pleasant 65 year old female with a history of hypertension and dyslipidemia. Unfortunately she's been intolerant to statins in the past and has failed both Lipitor Zocor Crestor and pravastatin. She is reported some increasing shortness of breath mostly when walking upstairs, however has been struggling with recurrent sinusitis and upper respiratory symptoms. Based on her risk factors, recommended metabolic testing. She underwent cardiopulmonary testing on 04/22/2013. She had maximal effort of 1.08 our ER. Peak VO2 was 108% predicted. Heart rate was 94% predicted. Her heart rate in view to curves were essentially normal with late flattening of her view to curve. Overall the study is low risk. She also went underwent palmar he function testing which showed normal diffusion, volume and flow loops. At her last visit she started taking Livalo samples due to an abnormal lipid profile. Her total cholesterol was 213, triglycerides 122, HDL 55 and LDL 134. Over the intial month on Livalo, she seemed to tolerate it.   Then she reported some right flank pain, especially when lifting weights, which he had started to do at the same time. She stopped her cholesterol medicine and stopped her exercise and her symptoms improved. She then restarted her exercise and had symptoms and realized that it was not likely the cholesterol medicine that was causing her right side pain. Her repeat cholesterol profile was abnormally high on only Zetia monotherapy, as she discontinued Livalo due to cramps. Her LDL particle number was 3189, LDL content was 190, HDL C. was 60 and triglycerides 148.    Mrs. Gremminger returns today for followup of her lipid profile. Her lipid profile in April was markedly improved with an LDL particle #1594, LDL content 105, HDL 51 and triglycerides 142. She reports some  improvement in her shortness of breath and has managed to work on exercise and has had 12 pounds of weight loss.  I saw Ms. Caissie back in the office today. Overall she is doing well except she still has some shortness of breath despite weight loss, particularly when walking up inclines. We will not been able to really establish the reason for this. Heart rate did go up to a very high level with exercise, 94% predicted on cardiopulmonary exercise testing in 2014. Is not clear whether this is an arrhythmia or just sinus tachycardia. This could explain why she feels worse when walking up hills. I like to see if we can re-create this with exercise treadmill stress testing. If there is a sharp increase in heart rate with exercise, she may benefit from a low-dose beta blocker.  05/11/2016  Ms. Mazak returns today for follow-up. She is without any significant complaints. Blood pressure was mildly elevated at 133/87 however repeat was 128/78. She is not having any problems on that area and Livalo. She is due for repeat cholesterol test. After an extensive workup we cannot find a cause from a cardiac standpoint of her shortness of breath. She was referred to [facility] and saw Dr. Vaughan Browner who performed palmar function test and feels that she might have some COPD. She's been on Spiriva and reports improvement in her shortness of breath.  PMHx:  Past Medical History  Diagnosis Date  . Hypertension   . Dyslipidemia     statin intolerance  . Vertigo   . Arthritis     Past Surgical History  Procedure Laterality Date  .  Abdominal hysterectomy  1972    cervical cancer    FAMHx:  Family History  Problem Relation Age of Onset  . Hypertension Mother     dx in her 63's  . Heart attack Mother 67  . Heart attack Brother 70  . Diabetes Brother   . Hyperlipidemia Brother   . Hypertension Brother   . Heart disease Brother     SOCHx:   reports that she quit smoking about 13 years ago.  Her smoking use included Cigarettes. She has a 16 pack-year smoking history. She has never used smokeless tobacco. She reports that she drinks alcohol. She reports that she does not use illicit drugs.  ALLERGIES:  Allergies  Allergen Reactions  . Statins     Muscle cramps    ROS: A comprehensive review of systems was negative except for: Respiratory: positive for dyspnea on exertion  HOME MEDS: Current Outpatient Prescriptions  Medication Sig Dispense Refill  . calcium carbonate (TUMS EX) 750 MG chewable tablet Chew 1 tablet by mouth daily.    . cholecalciferol (VITAMIN D) 1000 UNITS tablet Take 2,000 Units by mouth daily.     [name] ezetimibe (ZETIA) 10 MG tablet Take 1 tablet (10 mg total) by mouth daily. 90 tablet 3  . fexofenadine (ALLEGRA) 180 MG tablet Take 1 tablet (180 mg total) by mouth daily. 90 tablet 3  . fluticasone (FLONASE) 50 MCG/ACT nasal spray Place 2 sprays into both nostrils 2 (two) times daily. 48 g 2  . lisinopril-hydrochlorothiazide (PRINZIDE,ZESTORETIC) 10-12.5 MG tablet TAKE 1 TABLET DAILY 90 tablet 1  . Pitavastatin Calcium (LIVALO) 2 MG TABS Take 1 tablet (2 mg total) by mouth daily. 90 tablet 3  . Tiotropium Bromide Monohydrate (SPIRIVA RESPIMAT) 2.5 MCG/ACT AERS Inhale 2 puffs into the lungs daily. 3 Inhaler 4  . Travoprost, BAK Free, (TRAVATAN) 0.004 % SOLN ophthalmic solution Place 1 drop into both eyes at bedtime.     No current facility-administered medications for this visit.    LABS/IMAGING: No results found for this or any previous visit (from the past 48 hour(s)). No results found.  VITALS: BP 133/87 mmHg  Pulse 79  Ht 5' 4.5" (1.638 m)  Wt 160 lb (72.576 kg)  BMI 27.05 kg/m2  EXAM: General appearance: alert and no distress Neck: no carotid bruit and no JVD Lungs: clear to auscultation bilaterally Heart: regular rate and rhythm, S1, S2 normal, no murmur, click, rub or gallop Abdomen: soft, non-tender; bowel sounds normal; no masses,  no  organomegaly Extremities: extremities normal, atraumatic, no cyanosis or edema Pulses: 2+ and symmetric Skin: Skin color, texture, turgor normal. No rashes or lesions Neurologic: Grossly normal  EKG: Sinus rhythm at 79  ASSESSMENT: 1. Dyslipidemia 2.   Hypertension 3.   COPD  PLAN: 26.   Mrs. Roquemore does not have a clear cardiac cause of her dyspnea. Ultimately she was found to have likely COPD. She's really improved with Spiriva. She is due for repeat lipid profile and is compliant with her Zetia and Livalo. We will recheck a lipid profile today and I will make adjustments accordingly. Follow-up with me annually or sooner as necessary.  Pixie Casino, MD, [facility] Attending Cardiologist [place] 05/11/2016, 7:11 PM

## 2016-05-11 NOTE — Patient Instructions (Signed)
Your physician wants you to follow-up in: 1 year with Dr. Debara Pickett. You will receive a reminder letter in the mail two months in advance. If you don't receive a letter, please call our office to schedule the follow-up appointment.  Your medications (zetia & livalo) have been refills

## 2016-05-16 DIAGNOSIS — H18413 Arcus senilis, bilateral: Secondary | ICD-10-CM | POA: Diagnosis not present

## 2016-05-16 DIAGNOSIS — H43813 Vitreous degeneration, bilateral: Secondary | ICD-10-CM | POA: Diagnosis not present

## 2016-05-16 DIAGNOSIS — H25013 Cortical age-related cataract, bilateral: Secondary | ICD-10-CM | POA: Diagnosis not present

## 2016-05-16 DIAGNOSIS — H25043 Posterior subcapsular polar age-related cataract, bilateral: Secondary | ICD-10-CM | POA: Diagnosis not present

## 2016-05-16 DIAGNOSIS — H401131 Primary open-angle glaucoma, bilateral, mild stage: Secondary | ICD-10-CM | POA: Diagnosis not present

## 2016-05-18 DIAGNOSIS — E785 Hyperlipidemia, unspecified: Secondary | ICD-10-CM | POA: Diagnosis not present

## 2016-05-18 LAB — LIPID PANEL
Cholesterol: 188 mg/dL (ref 125–200)
HDL: 57 mg/dL (ref >=46)
LDL Cholesterol: 97 mg/dL (ref <130)
Total CHOL/HDL Ratio: 3.3 Ratio (ref <=5.0)
Triglycerides: 170 mg/dL — ABNORMAL HIGH (ref <150)
VLDL: 34 mg/dL — ABNORMAL HIGH (ref <30)

## 2016-06-14 ENCOUNTER — Encounter: Payer: Self-pay | Admitting: Pulmonary Disease

## 2016-06-14 ENCOUNTER — Ambulatory Visit (INDEPENDENT_AMBULATORY_CARE_PROVIDER_SITE_OTHER): Payer: Medicare Other | Admitting: Pulmonary Disease

## 2016-06-14 VITALS — BP 138/74 | HR 84 | Ht 64.5 in | Wt 157.8 lb

## 2016-06-14 DIAGNOSIS — R0609 Other forms of dyspnea: Secondary | ICD-10-CM

## 2016-06-14 DIAGNOSIS — R0600 Dyspnea, unspecified: Secondary | ICD-10-CM

## 2016-06-14 NOTE — Patient Instructions (Signed)
Continue Spiriva.  Return to clinic in 6 months.

## 2016-06-14 NOTE — Progress Notes (Signed)
Subjective:    Patient ID: Breanna Sharp, female    DOB: 1951/06/02, 65 y.o.   MRN: [id number]  HPI Breanna Sharp is a 65 year old with past medical history of hypertension. She is here for follow up of dyspnea on exertion for the past 2 years. His symptoms mostly occur when she is walking up an incline or climbing stairs. She usually gets winded after 1 flight of stairs. She does not have any associated wheeze, cough, sputum production. The symptoms do not occur at rest. She was evaluated with a cardiopulmonary exercise test on 04/22/13. The results showed good exercise capacity. The peak VO2 of 108% of predicted. There are no obvious pulmonary abnormalities. She had a good breathing reserve.  She denies any cough, sputum production, wheezing. She not have any palpitations, chest pain.    Data  CXR (09/01/15) No active cardiopulmonary disease.  PFTs 04/22/13 FVC 1.91 (73%) FEV1 1.44 (70%) F/F 75 SVC 2.05 (78%) DLCO 66%  PFTs 12/17/15 FVC 2.15 [84%) FEV1 1.70 (85%] F/F 79 TLC 93% DLCO 68% Minimal obstruction, mild reduction in diffusion  Echo 12/29/15 LV. Normal size and function. EF 55-60 percent. No evidence of pulmonary hypertension.  Social History: She smoked about 1 pack per day for 30 years and quit in 2001.  She worked at [employer]. She is now retired. There are no exposures at work or at home.  Family history: Mother, Brother - Coronary artery disease  Past Medical History  Diagnosis Date  . Hypertension   . Dyslipidemia     statin intolerance  . Vertigo   . Arthritis     Current outpatient prescriptions:  .  calcium carbonate (TUMS EX) 750 MG chewable tablet, Chew 1 tablet by mouth daily., Disp: , Rfl:  .  cholecalciferol (VITAMIN D) 1000 UNITS tablet, Take 2,000 Units by mouth daily. , Disp: , Rfl:  .  ezetimibe (ZETIA) 10 MG tablet, Take 1 tablet (10 mg total) by mouth daily., Disp: 90 tablet, Rfl: 3 .  fexofenadine (ALLEGRA) 180 MG tablet, Take 1  tablet (180 mg total) by mouth daily., Disp: 90 tablet, Rfl: 3 .  fluticasone (FLONASE) 50 MCG/ACT nasal spray, Place 2 sprays into both nostrils 2 (two) times daily., Disp: 48 g, Rfl: 2 .  lisinopril-hydrochlorothiazide (PRINZIDE,ZESTORETIC) 10-12.5 MG tablet, TAKE 1 TABLET DAILY, Disp: 90 tablet, Rfl: 1 .  Pitavastatin Calcium (LIVALO) 2 MG TABS, Take 1 tablet (2 mg total) by mouth daily., Disp: 90 tablet, Rfl: 3 .  Tiotropium Bromide Monohydrate (SPIRIVA RESPIMAT) 2.5 MCG/ACT AERS, Inhale 2 puffs into the lungs daily., Disp: 3 Inhaler, Rfl: 4 .  Travoprost, BAK Free, (TRAVATAN) 0.004 % SOLN ophthalmic solution, Place 1 drop into both eyes at bedtime., Disp: , Rfl:    Review of Systems Has dyspnea on exertion, not rest. No cough, sputum production, wheezing, hemoptysis. No chest pain, palpitations. No nausea, vomiting, diarrhea, constipation. All other review of systems are negative    Objective:   Physical Exam Blood pressure 138/74, pulse 84, height 5' 4.5" (1.638 m), weight 157 lb 12.8 oz (71.578 kg), SpO2 99 %. Gen.: No apparent distress Neuro: No gross focal deficits. Neck: No JVD, lymphadenopathy, thyromegaly. RS: Clear, No wheeze or crackles. CVS: S1-S2 heard, no murmurs rubs gallops. Abdomen: Soft, positive bowel sounds. Extremities: No edema.    Assessment & Plan:  Dyspnea on exertion. Likely secondary to COPD. Although her PFTs do not show any overt obstruction by F/F criteria she does have reduction in  mid flow rate and flow volume loop suggestive of mild obstruction. She does have extensive smoking history. She has responded well to Spiriva and we will continue same. She's had cardio pulmonary exercise test in 2014 which was entirely normal she exceeded her predicted VO2 max. She had adequate breathing reserve. There is no indication of any pulmonary impairment to exercise at that point.  She does have reduction in DLCO which however corrects for alveolar volume.Echo does  not show any pulmonary hypertension.  Plan: - Continue Spiriva.  Breanna Garfinkel MD [facility] Pager [phone] If no answer or after 3pm call: [phone] 06/14/2016, 1:37 PM

## 2016-07-11 ENCOUNTER — Other Ambulatory Visit: Payer: Self-pay | Admitting: Internal Medicine

## 2016-07-24 ENCOUNTER — Other Ambulatory Visit: Payer: Self-pay | Admitting: Family Medicine

## 2016-07-24 ENCOUNTER — Other Ambulatory Visit: Payer: Self-pay | Admitting: Internal Medicine

## 2016-07-24 DIAGNOSIS — Z1231 Encounter for screening mammogram for malignant neoplasm of breast: Secondary | ICD-10-CM

## 2016-08-18 ENCOUNTER — Telehealth: Payer: Self-pay | Admitting: Internal Medicine

## 2016-08-18 NOTE — Telephone Encounter (Signed)
Spoke with pt, aware to stop the livalo and continue the zetia. Pt agreed with this plan. And will call with a follow up of how she is doing.

## 2016-08-18 NOTE — Telephone Encounter (Signed)
Pt is having muscle pain and cramps again. She thinks it is coming from her Cholesterol medicine again.

## 2016-08-18 NOTE — Telephone Encounter (Signed)
Stop the Livalo - but continue zetia. Call us back to let us know if symptoms improve.  Don't stop both at once, difficult to know which was the culprit.  Dr. Lemmie Evens

## 2016-08-18 NOTE — Telephone Encounter (Signed)
Spoke with pt, she is having the same symptoms with the livalo and zetia she has had in the past with statins. As of today she is going to stop those medications. Will make dr hilty aware.

## 2016-08-25 ENCOUNTER — Ambulatory Visit
Admission: RE | Admit: 2016-08-25 | Discharge: 2016-08-25 | Disposition: A | Discharge status: Home / Self Care | Payer: Medicare Other | Source: Ambulatory Visit | Attending: Family Medicine | Admitting: Family Medicine

## 2016-08-25 DIAGNOSIS — Z1231 Encounter for screening mammogram for malignant neoplasm of breast: Secondary | ICD-10-CM | POA: Diagnosis not present

## 2016-08-31 ENCOUNTER — Encounter: Payer: Self-pay | Admitting: Family Medicine

## 2016-08-31 ENCOUNTER — Ambulatory Visit (INDEPENDENT_AMBULATORY_CARE_PROVIDER_SITE_OTHER): Payer: Medicare Other | Admitting: Family Medicine

## 2016-08-31 VITALS — BP 130/80 | HR 86 | Temp 98.7°F | Resp 16 | Ht 64.0 in | Wt 153.8 lb

## 2016-08-31 DIAGNOSIS — H6123 Impacted cerumen, bilateral: Secondary | ICD-10-CM | POA: Diagnosis not present

## 2016-08-31 DIAGNOSIS — Z23 Encounter for immunization: Secondary | ICD-10-CM

## 2016-08-31 DIAGNOSIS — R739 Hyperglycemia, unspecified: Secondary | ICD-10-CM

## 2016-08-31 DIAGNOSIS — Z0000 Encounter for general adult medical examination without abnormal findings: Secondary | ICD-10-CM

## 2016-08-31 DIAGNOSIS — J449 Chronic obstructive pulmonary disease, unspecified: Secondary | ICD-10-CM | POA: Diagnosis not present

## 2016-08-31 DIAGNOSIS — Z114 Encounter for screening for human immunodeficiency virus [HIV]: Secondary | ICD-10-CM | POA: Diagnosis not present

## 2016-08-31 DIAGNOSIS — J301 Allergic rhinitis due to pollen: Secondary | ICD-10-CM

## 2016-08-31 DIAGNOSIS — E781 Pure hyperglyceridemia: Secondary | ICD-10-CM

## 2016-08-31 DIAGNOSIS — R0609 Other forms of dyspnea: Secondary | ICD-10-CM

## 2016-08-31 DIAGNOSIS — G4700 Insomnia, unspecified: Secondary | ICD-10-CM

## 2016-08-31 DIAGNOSIS — I10 Essential (primary) hypertension: Secondary | ICD-10-CM

## 2016-08-31 DIAGNOSIS — R0600 Dyspnea, unspecified: Secondary | ICD-10-CM

## 2016-08-31 MED ORDER — LISINOPRIL-HYDROCHLOROTHIAZIDE 10-12.5 MG PO TABS: 1.0000 | ORAL_TABLET | Freq: Every day | ORAL | 2 refills | Status: DC

## 2016-08-31 MED ORDER — FLUTICASONE PROPIONATE 50 MCG/ACT NA SUSP: 2.0000 | Freq: Every day | NASAL | 6 refills | Status: DC

## 2016-08-31 NOTE — Patient Instructions (Addendum)
See information on insomnia, but return to discuss further if those symptoms persist in the next month as a sleep study may be needed with your snoring.   Keep follow up with your heart and lung doctors, continue spiriva for now.   I will check your cholesterol, but discuss any med issues or changes with Dr. Debara Pickett if needed.   Return to the clinic or go to the nearest emergency room if any of your symptoms worsen or new symptoms occur.  Call Dr. Kelby Fam office as it appears you may be due for screening colonoscopy: [address], [place], [place] [address]  Phone: [phone]  You are due for pneumovax (other pneumonia vaccine).  Can have this given at next visit as you requested.   Cerumen Impaction The structures of the external ear canal secrete a waxy substance known as cerumen. Excess cerumen can build up in the ear canal, causing a condition known as cerumen impaction. Cerumen impaction can cause ear pain and disrupt the function of the ear. The rate of cerumen production differs for each individual. In certain individuals, the configuration of the ear canal may decrease his or her ability to naturally remove cerumen. CAUSES Cerumen impaction is caused by excessive cerumen production or buildup. RISK FACTORS  Frequent use of swabs to clean ears.  Having narrow ear canals.  Having eczema.  Being dehydrated. SIGNS AND SYMPTOMS  Diminished hearing.  Ear drainage.  Ear pain.  Ear itch. TREATMENT Treatment may involve:  Over-the-counter or prescription ear drops to soften the cerumen.  Removal of cerumen by a health care provider. This may be done with:  Irrigation with warm water. This is the most common method of removal.  Ear curettes and other instruments.  Surgery. This may be done in severe cases. HOME CARE INSTRUCTIONS  Take medicines only as directed by your health care provider.  Do not insert objects into the ear with the intent of cleaning the  ear. PREVENTION  Do not insert objects into the ear, even with the intent of cleaning the ear. Removing cerumen as a part of normal hygiene is not necessary, and the use of swabs in the ear canal is not recommended.  Drink enough water to keep your urine clear or pale yellow.  Control your eczema if you have it. SEEK MEDICAL CARE IF:  You develop ear pain.  You develop bleeding from the ear.  The cerumen does not clear after you use ear drops as directed.   This information is not intended to replace advice given to you by your health care provider. Make sure you discuss any questions you have with your health care provider.   Document Released: 12/21/2004 Document Revised: 12/04/2014 Document Reviewed: 06/30/2015 Elsevier Interactive Patient Education 2016 [place].    Insomnia Insomnia is a sleep disorder that makes it difficult to fall asleep or to stay asleep. Insomnia can cause tiredness (fatigue), low energy, difficulty concentrating, mood swings, and poor performance at work or school.  There are three different ways to classify insomnia:  Difficulty falling asleep.  Difficulty staying asleep.  Waking up too early in the morning. Any type of insomnia can be long-term (chronic) or short-term (acute). Both are common. Short-term insomnia usually lasts for three months or less. Chronic insomnia occurs at least three times a week for longer than three months. CAUSES  Insomnia may be caused by another condition, situation, or substance, such as:  Anxiety.  Certain medicines.  Gastroesophageal reflux disease (GERD)  or other gastrointestinal conditions.  Asthma or other breathing conditions.  Restless legs syndrome, sleep apnea, or other sleep disorders.  Chronic pain.  Menopause. This may include hot flashes.  Stroke.  Abuse of alcohol, tobacco, or illegal drugs.  Depression.  Caffeine.   Neurological disorders, such as Alzheimer disease.  An  overactive thyroid (hyperthyroidism). The cause of insomnia may not be known. RISK FACTORS Risk factors for insomnia include:  Gender. Women are more commonly affected than men.  Age. Insomnia is more common as you get older.  Stress. This may involve your professional or personal life.  Income. Insomnia is more common in people with lower income.  Lack of exercise.   Irregular work schedule or night shifts.  Traveling between different time zones. SIGNS AND SYMPTOMS If you have insomnia, trouble falling asleep or trouble staying asleep is the main symptom. This may lead to other symptoms, such as:  Feeling fatigued.  Feeling nervous about going to sleep.  Not feeling rested in the morning.  Having trouble concentrating.  Feeling irritable, anxious, or depressed. TREATMENT  Treatment for insomnia depends on the cause. If your insomnia is caused by an underlying condition, treatment will focus on addressing the condition. Treatment may also include:   Medicines to help you sleep.  Counseling or therapy.  Lifestyle adjustments. HOME CARE INSTRUCTIONS   Take medicines only as directed by your health care provider.  Keep regular sleeping and waking hours. Avoid naps.  Keep a sleep diary to help you and your health care provider figure out what could be causing your insomnia. Include:   When you sleep.  When you wake up during the night.  How well you sleep.   How rested you feel the next day.  Any side effects of medicines you are taking.  What you eat and drink.   Make your bedroom a comfortable place where it is easy to fall asleep:  Put up shades or special blackout curtains to block light from outside.  Use a white noise machine to block noise.  Keep the temperature cool.   Exercise regularly as directed by your health care provider. Avoid exercising right before bedtime.  Use relaxation techniques to manage stress. Ask your health care  provider to suggest some techniques that may work well for you. These may include:  Breathing exercises.  Routines to release muscle tension.  Visualizing peaceful scenes.  Cut back on alcohol, caffeinated beverages, and cigarettes, especially close to bedtime. These can disrupt your sleep.  Do not overeat or eat spicy foods right before bedtime. This can lead to digestive discomfort that can make it hard for you to sleep.  Limit screen use before bedtime. This includes:  Watching TV.  Using your smartphone, tablet, and computer.  Stick to a routine. This can help you fall asleep faster. Try to do a quiet activity, brush your teeth, and go to bed at the same time each night.  Get out of bed if you are still awake after 15 minutes of trying to sleep. Keep the lights down, but try reading or doing a quiet activity. When you feel sleepy, go back to bed.  Make sure that you drive carefully. Avoid driving if you feel very sleepy.  Keep all follow-up appointments as directed by your health care provider. This is important. SEEK MEDICAL CARE IF:   You are tired throughout the day or have trouble in your daily routine due to sleepiness.  You continue to have sleep problems  or your sleep problems get worse. SEEK IMMEDIATE MEDICAL CARE IF:   You have serious thoughts about hurting yourself or someone else.   This information is not intended to replace advice given to you by your health care provider. Make sure you discuss any questions you have with your health care provider.   Document Released: 11/10/2000 Document Revised: 08/04/2015 Document Reviewed: 08/14/2014 Elsevier Interactive Patient Education 2016 [place] [facility]  Get These Tests  Blood Pressure- Have your blood pressure checked by your healthcare provider at least once a year.  Normal blood pressure is 120/80.  Weight- Have your body mass index (BMI) calculated to screen for obesity.  BMI is a  measure of body fat based on height and weight.  You can calculate your own BMI at GravelBags.it  Cholesterol- Have your cholesterol checked every year.  Diabetes- Have your blood sugar checked every year if you have high blood pressure, high cholesterol, a family history of diabetes or if you are overweight.  Pap Test - Have a pap test every 1 to 5 years if you have been sexually active.  If you are older than 65 and recent pap tests have been normal you may not need additional pap tests.  In addition, if you have had a hysterectomy  for benign disease additional pap tests are not necessary.  Mammogram-Yearly mammograms are essential for early detection of breast cancer  Screening for Colon Cancer- Colonoscopy starting at age 32. Screening may begin sooner depending on your family history and other health conditions.  Follow up colonoscopy as directed by your Gastroenterologist.  Screening for Osteoporosis- Screening begins at age 54 with bone density scanning, sooner if you are at higher risk for developing Osteoporosis.  Get these medicines  Calcium with Vitamin D- Your body requires 1200-1500 mg of Calcium a day and [phone] IU of Vitamin D a day.  You can only absorb 500 mg of Calcium at a time therefore Calcium must be taken in 2 or 3 separate doses throughout the day.  Hormones- Hormone therapy has been associated with increased risk for certain cancers and heart disease.  Talk to your healthcare provider about if you need relief from menopausal symptoms.  Aspirin- Ask your healthcare provider about taking Aspirin to prevent Heart Disease and Stroke.  Get these Immuniztions  Flu shot- Every fall  Pneumonia shot- Once after the age of 90; if you are younger ask your healthcare provider if you need a pneumonia shot.  Tetanus- Every ten years.  Zostavax- Once after the age of 69 to prevent shingles.  Take these steps  Don't smoke- Your healthcare provider can help  you quit. For tips on how to quit, ask your healthcare provider or go to www.smokefree.gov or call 1-800 QUIT-NOW.  Be physically active- Exercise 5 days a week for a minimum of 30 minutes.  If you are not already physically active, start slow and gradually work up to 30 minutes of moderate physical activity.  Try walking, dancing, bike riding, swimming, etc.  Eat a healthy diet- Eat a variety of healthy foods such as fruits, vegetables, whole grains, low fat milk, low fat cheeses, yogurt, lean meats, chicken, fish, eggs, dried beans, tofu, etc.  For more information go to www.thenutritionsource.org  Dental visit- Brush and floss teeth twice daily; visit your dentist twice a year.  Eye exam- Visit your Optometrist or Ophthalmologist yearly.  Drink alcohol in moderation- Limit alcohol intake to one drink or less a day.  Never drink and drive.  Depression- Your emotional health is as important as your physical health.  If you're feeling down or losing interest in things you normally enjoy, please talk to your healthcare provider.  Seat Belts- can save your life; always wear one  Smoke/Carbon Monoxide detectors- These detectors need to be installed on the appropriate level of your home.  Replace batteries at least once a year.  Violence- If anyone is threatening or hurting you, please tell your healthcare provider.  Living Will/ Health care power of attorney- Discuss with your healthcare provider and family.   IF you received an x-ray today, you will receive an invoice from [facility]. Please contact [facility] at [phone] with questions or concerns regarding your invoice.   IF you received labwork today, you will receive an invoice from [employer]. Please contact [facility] at [phone] with questions or concerns regarding your invoice.   Our billing staff will not be able to assist you with questions regarding bills from these  companies.  You will be contacted with the lab results as soon as they are available. The fastest way to get your results is to activate your My Chart account. Instructions are located on the last page of this paperwork. If you have not heard from [place] regarding the results in 2 weeks, please contact this office.    Influenza (Flu) Vaccine (Inactivated or Recombinant):  1. Why get vaccinated? Influenza ("flu") is a contagious disease that spreads around the [place] every year, usually between October and May. Flu is caused by influenza viruses, and is spread mainly by coughing, sneezing, and close contact. Anyone can get flu. Flu strikes suddenly and can last several days. Symptoms vary by age, but can include:  fever/chills  sore throat  muscle aches  fatigue  cough  headache  runny or stuffy nose Flu can also lead to pneumonia and blood infections, and cause diarrhea and seizures in children. If you have a medical condition, such as heart or lung disease, flu can make it worse. Flu is more dangerous for some people. Infants and young children, people 63 years of age and older, pregnant women, and people with certain health conditions or a weakened immune system are at greatest risk. Each year thousands of people in the [place] die from flu, and many more are hospitalized. Flu vaccine can:  keep you from getting flu,  make flu less severe if you do get it, and  keep you from spreading flu to your family and other people. 2. Inactivated and recombinant flu vaccines A dose of flu vaccine is recommended every flu season. Children 6 months through 26 years of age may need two doses during the same flu season. Everyone else needs only one dose each flu season. Some inactivated flu vaccines contain a very small amount of a mercury-based preservative called thimerosal. Studies have not shown thimerosal in vaccines to be harmful, but flu vaccines that do not contain thimerosal  are available. There is no live flu virus in flu shots. They cannot cause the flu. There are many flu viruses, and they are always changing. Each year a new flu vaccine is made to protect against three or four viruses that are likely to cause disease in the upcoming flu season. But even when the vaccine doesn't exactly match these viruses, it may still provide some protection. Flu vaccine cannot prevent:  flu that is caused by a virus not covered by the vaccine, or  illnesses that look like flu but are not. It takes about 2 weeks for protection to develop after vaccination, and protection lasts through the flu season. 3. Some people should not get this vaccine Tell the person who is giving you the vaccine:  If you have any severe, life-threatening allergies. If you ever had a life-threatening allergic reaction after a dose of flu vaccine, or have a severe allergy to any part of this vaccine, you may be advised not to get vaccinated. Most, but not all, types of flu vaccine contain a small amount of egg protein.  If you ever had Guillain-Barre Syndrome (also called GBS). Some people with a history of GBS should not get this vaccine. This should be discussed with your doctor.  If you are not feeling well. It is usually okay to get flu vaccine when you have a mild illness, but you might be asked to come back when you feel better. 4. Risks of a vaccine reaction With any medicine, including vaccines, there is a chance of reactions. These are usually mild and go away on their own, but serious reactions are also possible. Most people who get a flu shot do not have any problems with it. Minor problems following a flu shot include:  soreness, redness, or swelling where the shot was given  hoarseness  sore, red or itchy eyes  cough  fever  aches  headache  itching  fatigue If these problems occur, they usually begin soon after the shot and last 1 or 2 days. More serious problems  following a flu shot can include the following:  There may be a small increased risk of Guillain-Barre Syndrome (GBS) after inactivated flu vaccine. This risk has been estimated at 1 or 2 additional cases per million people vaccinated. This is much lower than the risk of severe complications from flu, which can be prevented by flu vaccine.  Young children who get the flu shot along with pneumococcal vaccine (PCV13) and/or DTaP vaccine at the same time might be slightly more likely to have a seizure caused by fever. Ask your doctor for more information. Tell your doctor if a child who is getting flu vaccine has ever had a seizure. Problems that could happen after any injected vaccine:  People sometimes faint after a medical procedure, including vaccination. Sitting or lying down for about 15 minutes can help prevent fainting, and injuries caused by a fall. Tell your doctor if you feel dizzy, or have vision changes or ringing in the ears.  Some people get severe pain in the shoulder and have difficulty moving the arm where a shot was given. This happens very rarely.  Any medication can cause a severe allergic reaction. Such reactions from a vaccine are very rare, estimated at about 1 in a million doses, and would happen within a few minutes to a few hours after the vaccination. As with any medicine, there is a very remote chance of a vaccine causing a serious injury or death. The safety of vaccines is always being monitored. For more information, visit: http://www.aguilar.org/ 5. What if there is a serious reaction? What should I look for?  Look for anything that concerns you, such as signs of a severe allergic reaction, very high fever, or unusual behavior. Signs of a severe allergic reaction can include hives, swelling of the face and throat, difficulty breathing, a fast heartbeat, dizziness, and weakness. These would start a few minutes to a few hours after the vaccination. What should I  do?  If you think it is a severe allergic reaction or other emergency that can't wait, call 9-1-1 and get the person to the nearest hospital. Otherwise, call your doctor.  Reactions should be reported to the Vaccine Adverse Event Reporting System (VAERS). Your doctor should file this report, or you can do it yourself through the VAERS web site at www.vaers.SamedayNews.es, or by calling 581-364-3312. VAERS does not give medical advice. 6. The National Vaccine Injury Compensation Program The [employer] Vaccine Injury Compensation Program (VICP) is a federal program that was created to compensate people who may have been injured by certain vaccines. Persons who believe they may have been injured by a vaccine can learn about the program and about filing a claim by calling 848 156 4616 or visiting the [employer] website at GoldCloset.com.ee. There is a time limit to file a claim for compensation. 7. How can I learn more?  Ask your healthcare provider. He or she can give you the vaccine package insert or suggest other sources of information.  Call your local or state health department.  Contact the Centers for Disease Control and Prevention (CDC):  Call (623)871-1853 (1-800-CDC-INFO) or  Visit CDC's website at https://gibson.com/ Vaccine Information Statement Inactivated Influenza Vaccine (07/03/2014)   This information is not intended to replace advice given to you by your health care provider. Make sure you discuss any questions you have with your health care provider.   Document Released: 09/07/2006 Document Revised: 12/04/2014 Document Reviewed: 07/06/2014 Elsevier Interactive Patient Education [employer].

## 2016-08-31 NOTE — Progress Notes (Addendum)
Subjective:  By signing my name below, I, Breanna Sharp, attest that this documentation has been prepared under the direction and in the presence of Breanna Ray, MD. Electronically Signed: Moises Sharp, [place]. 08/31/2016 , 11:56 AM .  Patient was seen in Room 23 .   Patient ID: Breanna Sharp, female    DOB: [birth year], 65 y.o.   MRN: [id number] Chief Complaint  Patient presents with  . Annual Exam    flu shot   HPI  Breanna Sharp is a 65 y.o. female Here for annual physical. H/o peptic ulcer, HTN, HLD, and shortness of breath/dyspnea on exertion. She had fasting Sharp work done this morning.   Allergic rhinitis  She uses flonase nasal spray qd and OTC allegra, listed on chart. She does 2 sprays each nostril once a day.   Hearing loss She has alternate muffled hearing. She's tried debrox and peroxide without relief. She's had her ears flushed in office in the past.   HLD Per phone note in Sept, she was having muscle pain and cramps with Livalo and Zetia, managed by Dr. Debara Pickett. She was advised to stop Livalo and just continue Zetia '10mg'$  qd. She's been intolerant to multiple statins in the past.   DOE She had cardiopulmonary test in May 2014 with low risk study; unknown reason of shortness of breath based on cardiology note. Apparently, heart rate increased with exercise. She had ETT on May 27, 2015, that was low risk with 7 MET's. She's been followed by Dr. Vaughan Browner. She does have history of tobacco use, 1 ppd for 30 years, quit in 2001. Her DOE was thought secondary to COPD with mild obstruction. She had improved on spiriva.   She still has shortness of breath; although has temporary relief with spiriva. She denies chest pain or chest tightness.   Urinary frequency She reports drinking about 8 glasses of water a day. She would urinate more often after drinking water in the morning. She denies dysuria.   Sleep disturbance  She mentions sleeping about 4~5 hours at night  and then waking up to use the bathroom. She wasn't able to return to sleep for another 3~4 hours. She informs she snores at night. She denies any knowledge of pausing in her breathing while sleeping.   HTN She takes lisinopril-hctz 10-12.'5mg'$  qd. She was previously taking just HCTZ but was switched about 4 years ago.   She checks her Sharp pressure intermittently, usually runs around 125/70 at home.   Hyperglycemia She had elevated glucose of 110 in Oct 2016, usually ranging from 102~115 over the past few years. She had an A1c of 6.0 3 years ago. She rarely drinks soda and eat sweets.   Peptic ulcer and colon polyps She followed by GI, Dr. [name]. She had EGD in 2008, noting a gastric ulcer without hemorrhage.   Cancer Screening Colonoscopy: last done in Sept 2008, hyperplastic polyp was removed, done by Dr. [name].  Mammogram: Sept 2017, no evidence of malignancy Cervical cancer screening: she had a hysterectomy due to cancer when she was younger; her last pap test was 4 years ago, and have been normal  Osteoporosis screening Bone density screening: Nov 2016.  Osteoporotic fracture risk is 4.2% over 10 years and hip fracture is 0.5% 10 years.   Immunizations Immunization History  Administered Date(s) Administered  . Influenza Split 08/14/2012  . Influenza,inj,Quad PF,36+ Mos 08/20/2013, 08/26/2014, 09/01/2015, 08/31/2016  . Pneumococcal Conjugate-13 09/01/2015  . Tdap 08/20/2013  . Zoster 09/28/2011  She is due for pneumovax, otherwise up to date. She would like to defer her pneumovax until next month.  She also received flu shot today.   Hep C Screening She had negative Hep C antibody in Sept 2015.   HIV Screening She agrees to HIV screening today.   Depression Depression screen [facility] 2/9 08/31/2016 09/01/2015 07/03/2015 08/26/2014  Decreased Interest 0 0 0 0  Down, Depressed, Hopeless 0 0 0 0  PHQ - 2 Score 0 0 0 0    Vision  Visual Acuity Screening   Right eye Left eye  Both eyes  Without correction:     With correction: '20/40 20/30 20/25 '$   She has an eye doctor and seen within past year.   Dentist She has a regular dentist and will see in 4 days.   Exercise She exercises about 5~6 days a week.   Fall Screening She denies any falls over the past year.   Functional Status Survey: Is the patient deaf or have difficulty hearing?: No Does the patient have difficulty seeing, even when wearing glasses/contacts?: No Does the patient have difficulty concentrating, remembering, or making decisions?: No Does the patient have difficulty walking or climbing stairs?: No (sometimes going upstairs shortness of breath or incline) Does the patient have difficulty dressing or bathing?: No Does the patient have difficulty doing errands alone such as visiting a doctor's office or shopping?: No  Advanced Directives She doesn't have advanced directives.   Patient Active Problem List   Diagnosis Date Noted  . Dyslipidemia 05/11/2016  . Shortness of breath 05/11/2016  . Essential hypertension 05/11/2016  . Osteopenia 10/12/2015  . DOE (dyspnea on exertion) 05/02/2013  . Chronic sinusitis 05/02/2013  . Vertigo 08/14/2012  . Benign essential HTN 08/14/2012  . BMI 28.0-28.9,adult 08/14/2012  . COLONIC POLYPS 01/24/2008  . ACUT GASTR ULCER W/O MENTION HEMORR PERF/OBST 01/24/2008   Past Medical History:  Diagnosis Date  . Arthritis   . Dyslipidemia    statin intolerance  . Hypertension   . Vertigo    Past Surgical History:  Procedure Laterality Date  . ABDOMINAL HYSTERECTOMY  1972   cervical cancer   Allergies  Allergen Reactions  . Statins     Muscle cramps   Prior to Admission medications   Medication Sig Start Date End Date Taking? Authorizing Provider  calcium carbonate (TUMS EX) 750 MG chewable tablet Chew 1 tablet by mouth daily.   Yes Historical Provider, MD  cholecalciferol (VITAMIN D) 1000 UNITS tablet Take 2,000 Units by mouth daily.    Yes  Historical Provider, MD  ezetimibe (ZETIA) 10 MG tablet Take 1 tablet (10 mg total) by mouth daily. 05/11/16  Yes Pixie Casino, MD  fexofenadine (ALLEGRA) 180 MG tablet Take 1 tablet (180 mg total) by mouth daily. 10/04/13  Yes Robyn Haber, MD  fluticasone (FLONASE) 50 MCG/ACT nasal spray Place 2 sprays into both nostrils 2 (two) times daily. 11/05/15  Yes Leandrew Koyanagi, MD  lisinopril-hydrochlorothiazide (PRINZIDE,ZESTORETIC) 10-12.5 MG tablet TAKE 1 TABLET DAILY 04/10/16  Yes Pixie Casino, MD  Tiotropium Bromide Monohydrate (SPIRIVA RESPIMAT) 2.5 MCG/ACT AERS Inhale 2 puffs into the lungs daily. 12/17/15  Yes Praveen Mannam, MD  Travoprost, BAK Free, (TRAVATAN) 0.004 % SOLN ophthalmic solution Place 1 drop into both eyes at bedtime.   Yes Historical Provider, MD   Social History   Social History  . Marital status: Married    Spouse name: [name]  . Number of children: 0  .  Years of education: 62   Occupational History  .  Retired    Retired   Social History Main Topics  . Smoking status: Former Smoker    Packs/day: 0.50    Years: 32.00    Types: Cigarettes    Quit date: 11/27/2002  . Smokeless tobacco: Never Used  . Alcohol use 1.8 oz/week    3 Standard drinks or equivalent per week     Comment: social - 2 or 3 times a month 1-2 glasses of wine  . Drug use: No  . Sexual activity: Yes    Partners: Male   Other Topics Concern  . Not on file   Social History Narrative   Exercise 3 to 4 times/week walking for 45 min -1 hour   Patient lives at home with her husband Legrand [place]).   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   Right handed.   decaffeine Green Tea.   Review of Systems 13 point ROS - positive for ear pain, shortness of breath, urinary frequency, sleep disturbance     Objective:   Physical Exam  Constitutional: She is oriented to person, place, and time. She appears  well-developed and well-nourished.  HENT:  Head: Normocephalic and atraumatic.  Right Ear: External ear normal.  Left Ear: External ear normal.  Mouth/Throat: Oropharynx is clear and moist.  Left ear canal: excess yellow cerumen; unable to visualize TM Right ear canal: excess yellow cerumen; unable to visualize TM She does have narrow canals bilaterally  Eyes: Conjunctivae are normal. Pupils are equal, round, and reactive to light.  Neck: Normal range of motion. Neck supple. No thyromegaly present.  Cardiovascular: Normal rate, regular rhythm, normal heart sounds and intact distal pulses.   No murmur heard. Pulmonary/Chest: Effort normal and breath sounds normal. No respiratory distress. She has no wheezes.  Abdominal: Soft. Bowel sounds are normal. There is no tenderness.  Musculoskeletal: Normal range of motion. She exhibits no edema or tenderness.  Lymphadenopathy:    She has no cervical adenopathy.  Neurological: She is alert and oriented to person, place, and time.  Skin: Skin is warm and dry. No rash noted.  Psychiatric: She has a normal mood and affect. Her behavior is normal. Thought content normal.  Vitals reviewed.    Vitals:   08/31/16 1020  BP: 130/80  Pulse: 86  Resp: 16  Temp: 98.7 F (37.1 C)  TempSrc: Oral  SpO2: 98%  Weight: 153 lb 12.8 oz (69.8 kg)  Height: '5\' 4"'$  (1.626 m)      Assessment & Plan:   JACALYNN BUZZELL is a 65 y.o. female Medicare annual wellness visit, subsequent  --anticipatory guidance as below in AVS, screening labs above. Health maintenance items as above in HPI discussed/recommended as applicable.   - advance directive info provided.   - denies falls in past year, no concerns on functional status/depression screening.    Flu vaccine need - Plan: Flu Vaccine QUAD 36+ mos IM given.   DOE (dyspnea on exertion) Chronic obstructive pulmonary disease, unspecified COPD type ([place])  -DOE likely due to COPD. Improved on Spiriva. Follow-up  with pulmonary and cardiologist as planned. RTC precautions if acute worsening.  Essential hypertension - Plan: lisinopril-hydrochlorothiazide (PRINZIDE,ZESTORETIC) 10-12.5 MG tablet, COMPLETE METABOLIC PANEL WITH GFR  - stable. Labs pending, continue same dose of lisinopril/HCTZ  Bilateral impacted cerumen - Plan: Ear wax removal  - Attempted lavage bilaterally, difficult  with narrow canals. Has required ENT eval for removal of cerumen in the past. Able to remove cerumen with assistance of another provider. TMs reportedly ok after removal of cerumen.   Allergic rhinitis due to pollen, unspecified chronicity, unspecified seasonality - Plan: fluticasone (FLONASE) 50 MCG/ACT nasal spray  - Stable with over-the-counter Allegra and Flonase as needed. Flonase refilled.  Hyperglycemia - Plan: COMPLETE METABOLIC PANEL WITH GFR, Hemoglobin A1C  - Check A1c, previous A1c indicated possible prediabetes.  Hypertriglyceridemia - Plan: COMPLETE METABOLIC PANEL WITH GFR, Lipid panel  - Tolerating Zetia, continue follow-up with cardiology. Lipids drawn, but any changes to be determined by cardiologist.  Insomnia, unspecified type  - Information given on after visit summary on insomnia and sleep hygiene, follow-up within the next month to discuss the symptoms further, as with snoring may need sleep study. Return to clinic sooner if worsening.  Need for prophylactic vaccination against Streptococcus pneumoniae (pneumococcus)  -She declined Pneumovax today, plans on having this done at next visit. This can be done without office visit if needed.   Screening for HIV (human immunodeficiency virus) - Plan: HIV antibody   Meds ordered this encounter  Medications  . fluticasone (FLONASE) 50 MCG/ACT nasal spray    Sig: Place 2 sprays into both nostrils daily.    Dispense:  48 g    Refill:  6  . lisinopril-hydrochlorothiazide (PRINZIDE,ZESTORETIC) 10-12.5 MG tablet    Sig: Take 1 tablet by mouth daily.     Dispense:  90 tablet    Refill:  2   Patient Instructions    See information on insomnia, but return to discuss further if those symptoms persist in the next month as a sleep study may be needed with your snoring.   Keep follow up with your heart and lung doctors, continue spiriva for now.   I will check your cholesterol, but discuss any med issues or changes with Dr. Debara Pickett if needed.   Return to the clinic or go to the nearest emergency room if any of your symptoms worsen or new symptoms occur.  Call Dr. Kelby Fam office as it appears you may be due for screening colonoscopy: [address], [place], [place] [address]  Phone: [phone]  You are due for pneumovax (other pneumonia vaccine).  Can have this given at next visit as you requested.   Cerumen Impaction The structures of the external ear canal secrete a waxy substance known as cerumen. Excess cerumen can build up in the ear canal, causing a condition known as cerumen impaction. Cerumen impaction can cause ear pain and disrupt the function of the ear. The rate of cerumen production differs for each individual. In certain individuals, the configuration of the ear canal may decrease his or her ability to naturally remove cerumen. CAUSES Cerumen impaction is caused by excessive cerumen production or buildup. RISK FACTORS  Frequent use of swabs to clean ears.  Having narrow ear canals.  Having eczema.  Being dehydrated. SIGNS AND SYMPTOMS  Diminished hearing.  Ear drainage.  Ear pain.  Ear itch. TREATMENT Treatment may involve:  Over-the-counter or prescription ear drops to soften the cerumen.  Removal of cerumen by a health care provider. This may be done with:  Irrigation with warm water. This is the most common method of removal.  Ear curettes and other instruments.  Surgery. This may be done in severe cases. HOME CARE INSTRUCTIONS  Take medicines only as directed by your health care provider.  Do not  insert objects into  the ear with the intent of cleaning the ear. PREVENTION  Do not insert objects into the ear, even with the intent of cleaning the ear. Removing cerumen as a part of normal hygiene is not necessary, and the use of swabs in the ear canal is not recommended.  Drink enough water to keep your urine clear or pale yellow.  Control your eczema if you have it. SEEK MEDICAL CARE IF:  You develop ear pain.  You develop bleeding from the ear.  The cerumen does not clear after you use ear drops as directed.   This information is not intended to replace advice given to you by your health care provider. Make sure you discuss any questions you have with your health care provider.   Document Released: 12/21/2004 Document Revised: 12/04/2014 Document Reviewed: 06/30/2015 Elsevier Interactive Patient Education 2016 [place].    Insomnia Insomnia is a sleep disorder that makes it difficult to fall asleep or to stay asleep. Insomnia can cause tiredness (fatigue), low energy, difficulty concentrating, mood swings, and poor performance at work or school.  There are three different ways to classify insomnia:  Difficulty falling asleep.  Difficulty staying asleep.  Waking up too early in the morning. Any type of insomnia can be long-term (chronic) or short-term (acute). Both are common. Short-term insomnia usually lasts for three months or less. Chronic insomnia occurs at least three times a week for longer than three months. CAUSES  Insomnia may be caused by another condition, situation, or substance, such as:  Anxiety.  Certain medicines.  Gastroesophageal reflux disease (GERD) or other gastrointestinal conditions.  Asthma or other breathing conditions.  Restless legs syndrome, sleep apnea, or other sleep disorders.  Chronic pain.  Menopause. This may include hot flashes.  Stroke.  Abuse of alcohol, tobacco, or illegal drugs.  Depression.  Caffeine.    Neurological disorders, such as Alzheimer disease.  An overactive thyroid (hyperthyroidism). The cause of insomnia may not be known. RISK FACTORS Risk factors for insomnia include:  Gender. Women are more commonly affected than men.  Age. Insomnia is more common as you get older.  Stress. This may involve your professional or personal life.  Income. Insomnia is more common in people with lower income.  Lack of exercise.   Irregular work schedule or night shifts.  Traveling between different time zones. SIGNS AND SYMPTOMS If you have insomnia, trouble falling asleep or trouble staying asleep is the main symptom. This may lead to other symptoms, such as:  Feeling fatigued.  Feeling nervous about going to sleep.  Not feeling rested in the morning.  Having trouble concentrating.  Feeling irritable, anxious, or depressed. TREATMENT  Treatment for insomnia depends on the cause. If your insomnia is caused by an underlying condition, treatment will focus on addressing the condition. Treatment may also include:   Medicines to help you sleep.  Counseling or therapy.  Lifestyle adjustments. HOME CARE INSTRUCTIONS   Take medicines only as directed by your health care provider.  Keep regular sleeping and waking hours. Avoid naps.  Keep a sleep diary to help you and your health care provider figure out what could be causing your insomnia. Include:   When you sleep.  When you wake up during the night.  How well you sleep.   How rested you feel the next day.  Any side effects of medicines you are taking.  What you eat and drink.   Make your bedroom a comfortable place where it is easy to fall  asleep:  Put up shades or special blackout curtains to block light from outside.  Use a white noise machine to block noise.  Keep the temperature cool.   Exercise regularly as directed by your health care provider. Avoid exercising right before bedtime.  Use  relaxation techniques to manage stress. Ask your health care provider to suggest some techniques that may work well for you. These may include:  Breathing exercises.  Routines to release muscle tension.  Visualizing peaceful scenes.  Cut back on alcohol, caffeinated beverages, and cigarettes, especially close to bedtime. These can disrupt your sleep.  Do not overeat or eat spicy foods right before bedtime. This can lead to digestive discomfort that can make it hard for you to sleep.  Limit screen use before bedtime. This includes:  Watching TV.  Using your smartphone, tablet, and computer.  Stick to a routine. This can help you fall asleep faster. Try to do a quiet activity, brush your teeth, and go to bed at the same time each night.  Get out of bed if you are still awake after 15 minutes of trying to sleep. Keep the lights down, but try reading or doing a quiet activity. When you feel sleepy, go back to bed.  Make sure that you drive carefully. Avoid driving if you feel very sleepy.  Keep all follow-up appointments as directed by your health care provider. This is important. SEEK MEDICAL CARE IF:   You are tired throughout the day or have trouble in your daily routine due to sleepiness.  You continue to have sleep problems or your sleep problems get worse. SEEK IMMEDIATE MEDICAL CARE IF:   You have serious thoughts about hurting yourself or someone else.   This information is not intended to replace advice given to you by your health care provider. Make sure you discuss any questions you have with your health care provider.   Document Released: 11/10/2000 Document Revised: 08/04/2015 Document Reviewed: 08/14/2014 Elsevier Interactive Patient Education 2016 [place] [employer]  Get These Tests  Sharp Pressure- Have your Sharp pressure checked by your healthcare provider at least once a year.  Normal Sharp pressure is 120/80.  Weight- Have your body mass  index (BMI) calculated to screen for obesity.  BMI is a measure of body fat based on height and weight.  You can calculate your own BMI at [employer].it  Cholesterol- Have your cholesterol checked every year.  Diabetes- Have your Sharp sugar checked every year if you have high Sharp pressure, high cholesterol, a family history of diabetes or if you are overweight.  Pap Test - Have a pap test every 1 to 5 years if you have been sexually active.  If you are older than 65 and recent pap tests have been normal you may not need additional pap tests.  In addition, if you have had a hysterectomy  for benign disease additional pap tests are not necessary.  Mammogram-Yearly mammograms are essential for early detection of breast cancer  Screening for Colon Cancer- Colonoscopy starting at age 72. Screening may begin sooner depending on your family history and other health conditions.  Follow up colonoscopy as directed by your Gastroenterologist.  Screening for Osteoporosis- Screening begins at age 88 with bone density scanning, sooner if you are at higher risk for developing Osteoporosis.  Get these medicines  Calcium with Vitamin D- Your body requires 1200-1500 mg of Calcium a day and [phone] IU of Vitamin D a day.  You can only  absorb 500 mg of Calcium at a time therefore Calcium must be taken in 2 or 3 separate doses throughout the day.  Hormones- Hormone therapy has been associated with increased risk for certain cancers and heart disease.  Talk to your healthcare provider about if you need relief from menopausal symptoms.  Aspirin- Ask your healthcare provider about taking Aspirin to prevent Heart Disease and Stroke.  Get these Immuniztions  Flu shot- Every fall  Pneumonia shot- Once after the age of 77; if you are younger ask your healthcare provider if you need a pneumonia shot.  Tetanus- Every ten years.  Zostavax- Once after the age of 42 to prevent shingles.  Take these  steps  Don't smoke- Your healthcare provider can help you quit. For tips on how to quit, ask your healthcare provider or go to www.smokefree.gov or call 1-800 QUIT-NOW.  Be physically active- Exercise 5 days a week for a minimum of 30 minutes.  If you are not already physically active, start slow and gradually work up to 30 minutes of moderate physical activity.  Try walking, dancing, bike riding, swimming, etc.  Eat a healthy diet- Eat a variety of healthy foods such as fruits, vegetables, whole grains, low fat milk, low fat cheeses, yogurt, lean meats, chicken, fish, eggs, dried beans, tofu, etc.  For more information go to www.thenutritionsource.org  Dental visit- Brush and floss teeth twice daily; visit your dentist twice a year.  Eye exam- Visit your Optometrist or Ophthalmologist yearly.  Drink alcohol in moderation- Limit alcohol intake to one drink or less a day.  Never drink and drive.  Depression- Your emotional health is as important as your physical health.  If you're feeling down or losing interest in things you normally enjoy, please talk to your healthcare provider.  Seat Belts- can save your life; always wear one  Smoke/Carbon Monoxide detectors- These detectors need to be installed on the appropriate level of your home.  Replace batteries at least once a year.  Violence- If anyone is threatening or hurting you, please tell your healthcare provider.  Living Will/ Health care power of attorney- Discuss with your healthcare provider and family.   IF you received an x-Sharp today, you will receive an invoice from [facility]. Please contact [facility] at [phone] with questions or concerns regarding your invoice.   IF you received labwork today, you will receive an invoice from [employer]. Please contact [facility] at [phone] with questions or concerns regarding your invoice.   Our billing staff will not be able to assist  you with questions regarding bills from these companies.  You will be contacted with the lab results as soon as they are available. The fastest way to get your results is to activate your My Chart account. Instructions are located on the last page of this paperwork. If you have not heard from [place] regarding the results in 2 weeks, please contact this office.    Influenza (Flu) Vaccine (Inactivated or Recombinant):  1. Why get vaccinated? Influenza ("flu") is a contagious disease that spreads around the [place] every year, usually between October and May. Flu is caused by influenza viruses, and is spread mainly by coughing, sneezing, and close contact. Anyone can get flu. Flu strikes suddenly and can last several days. Symptoms vary by age, but can include:  fever/chills  sore throat  muscle aches  fatigue  cough  headache  runny or stuffy nose Flu can also lead to pneumonia and Sharp  infections, and cause diarrhea and seizures in children. If you have a medical condition, such as heart or lung disease, flu can make it worse. Flu is more dangerous for some people. Infants and young children, people 9 years of age and older, pregnant women, and people with certain health conditions or a weakened immune system are at greatest risk. Each year thousands of people in the [place] die from flu, and many more are hospitalized. Flu vaccine can:  keep you from getting flu,  make flu less severe if you do get it, and  keep you from spreading flu to your family and other people. 2. Inactivated and recombinant flu vaccines A dose of flu vaccine is recommended every flu season. Children 6 months through 56 years of age may need two doses during the same flu season. Everyone else needs only one dose each flu season. Some inactivated flu vaccines contain a very small amount of a mercury-based preservative called thimerosal. Studies have not shown thimerosal in vaccines to be harmful, but  flu vaccines that do not contain thimerosal are available. There is no live flu virus in flu shots. They cannot cause the flu. There are many flu viruses, and they are always changing. Each year a new flu vaccine is made to protect against three or four viruses that are likely to cause disease in the upcoming flu season. But even when the vaccine doesn't exactly match these viruses, it may still provide some protection. Flu vaccine cannot prevent:  flu that is caused by a virus not covered by the vaccine, or  illnesses that look like flu but are not. It takes about 2 weeks for protection to develop after vaccination, and protection lasts through the flu season. 3. Some people should not get this vaccine Tell the person who is giving you the vaccine:  If you have any severe, life-threatening allergies. If you ever had a life-threatening allergic reaction after a dose of flu vaccine, or have a severe allergy to any part of this vaccine, you may be advised not to get vaccinated. Most, but not all, types of flu vaccine contain a small amount of egg protein.  If you ever had Guillain-Barre Syndrome (also called GBS). Some people with a history of GBS should not get this vaccine. This should be discussed with your doctor.  If you are not feeling well. It is usually okay to get flu vaccine when you have a mild illness, but you might be asked to come back when you feel better. 4. Risks of a vaccine reaction With any medicine, including vaccines, there is a chance of reactions. These are usually mild and go away on their own, but serious reactions are also possible. Most people who get a flu shot do not have any problems with it. Minor problems following a flu shot include:  soreness, redness, or swelling where the shot was given  hoarseness  sore, red or itchy eyes  cough  fever  aches  headache  itching  fatigue If these problems occur, they usually begin soon after the shot and last  1 or 2 days. More serious problems following a flu shot can include the following:  There may be a small increased risk of Guillain-Barre Syndrome (GBS) after inactivated flu vaccine. This risk has been estimated at 1 or 2 additional cases per million people vaccinated. This is much lower than the risk of severe complications from flu, which can be prevented by flu vaccine.  Young children who  get the flu shot along with pneumococcal vaccine (PCV13) and/or DTaP vaccine at the same time might be slightly more likely to have a seizure caused by fever. Ask your doctor for more information. Tell your doctor if a child who is getting flu vaccine has ever had a seizure. Problems that could happen after any injected vaccine:  People sometimes faint after a medical procedure, including vaccination. Sitting or lying down for about 15 minutes can help prevent fainting, and injuries caused by a fall. Tell your doctor if you feel dizzy, or have vision changes or ringing in the ears.  Some people get severe pain in the shoulder and have difficulty moving the arm where a shot was given. This happens very rarely.  Any medication can cause a severe allergic reaction. Such reactions from a vaccine are very rare, estimated at about 1 in a million doses, and would happen within a few minutes to a few hours after the vaccination. As with any medicine, there is a very remote chance of a vaccine causing a serious injury or death. The safety of vaccines is always being monitored. For more information, visit: http://www.aguilar.org/ 5. What if there is a serious reaction? What should I look for?  Look for anything that concerns you, such as signs of a severe allergic reaction, very high fever, or unusual behavior. Signs of a severe allergic reaction can include hives, swelling of the face and throat, difficulty breathing, a fast heartbeat, dizziness, and weakness. These would start a few minutes to a few hours after  the vaccination. What should I do?  If you think it is a severe allergic reaction or other emergency that can't wait, call 9-1-1 and get the person to the nearest hospital. Otherwise, call your doctor.  Reactions should be reported to the Vaccine Adverse Event Reporting System (VAERS). Your doctor should file this report, or you can do it yourself through the VAERS web site at www.vaers.SamedayNews.es, or by calling 661-772-1921. VAERS does not give medical advice. 6. The National Vaccine Injury Compensation Program The [employer] Vaccine Injury Compensation Program (VICP) is a federal program that was created to compensate people who may have been injured by certain vaccines. Persons who believe they may have been injured by a vaccine can learn about the program and about filing a claim by calling 678-861-7169 or visiting the [place] website at GoldCloset.com.ee. There is a time limit to file a claim for compensation. 7. How can I learn more?  Ask your healthcare provider. He or she can give you the vaccine package insert or suggest other sources of information.  Call your local or state health department.  Contact the Centers for Disease Control and Prevention (CDC):  Call 9068861688 (1-800-CDC-INFO) or  Visit CDC's website at https://gibson.com/ Vaccine Information Statement Inactivated Influenza Vaccine (07/03/2014)   This information is not intended to replace advice given to you by your health care provider. Make sure you discuss any questions you have with your health care provider.   Document Released: 09/07/2006 Document Revised: 12/04/2014 Document Reviewed: 07/06/2014 Elsevier Interactive Patient Education [employer].     I personally performed the services described in this documentation, which was scribed in my presence. The recorded information has been reviewed and considered, and addended by me as needed.   Signed,   Breanna Ray, MD [facility].  08/31/16 12:24 PM

## 2016-08-31 NOTE — Progress Notes (Signed)
   Subjective:    Patient ID: Breanna Sharp, female    DOB: [birth year], 65 y.o.   MRN: [id number]  HPI    Review of Systems  Constitutional: Negative.   HENT: Positive for ear pain.   Eyes: Negative.   Respiratory: Positive for shortness of breath.   Cardiovascular: Negative.   Gastrointestinal: Negative.   Endocrine: Negative.   Genitourinary: Positive for frequency.  Musculoskeletal: Negative.   Skin: Negative.   Allergic/Immunologic: Negative.   Neurological: Negative.   Hematological: Negative.   Psychiatric/Behavioral: Positive for sleep disturbance.       Objective:   Physical Exam        Assessment & Plan:

## 2016-09-01 LAB — HIV ANTIBODY (ROUTINE TESTING W REFLEX): HIV 1&2 Ab, 4th Generation: NONREACTIVE

## 2016-09-01 LAB — COMPLETE METABOLIC PANEL WITH GFR
ALT: 10 U/L (ref 6–29)
AST: 16 U/L (ref 10–35)
Albumin: 4.2 g/dL (ref 3.6–5.1)
Alkaline Phosphatase: 62 U/L (ref 33–130)
BUN: 12 mg/dL (ref 7–25)
CO2: 26 mmol/L (ref 20–31)
Calcium: 10.2 mg/dL (ref 8.6–10.4)
Chloride: 106 mmol/L (ref 98–110)
Creat: 0.9 mg/dL (ref 0.50–0.99)
GFR, Est African American: 77 mL/min (ref >=60)
GFR, Est Non African American: 67 mL/min (ref >=60)
Glucose, Bld: 118 mg/dL — ABNORMAL HIGH (ref 65–99)
Potassium: 4.2 mmol/L (ref 3.5–5.3)
Sodium: 144 mmol/L (ref 135–146)
Total Bilirubin: 0.6 mg/dL (ref 0.2–1.2)
Total Protein: 6.6 g/dL (ref 6.1–8.1)

## 2016-09-01 LAB — LIPID PANEL
Cholesterol: 256 mg/dL — ABNORMAL HIGH (ref 125–200)
HDL: 61 mg/dL (ref >=46)
LDL Cholesterol: 167 mg/dL — ABNORMAL HIGH (ref <130)
Total CHOL/HDL Ratio: 4.2 Ratio (ref <=5.0)
Triglycerides: 139 mg/dL (ref <150)
VLDL: 28 mg/dL (ref <30)

## 2016-09-01 LAB — HEMOGLOBIN A1C
Hgb A1c MFr Bld: 5.5 % (ref <5.7)
Mean Plasma Glucose: 111 mg/dL

## 2016-09-07 ENCOUNTER — Ambulatory Visit (INDEPENDENT_AMBULATORY_CARE_PROVIDER_SITE_OTHER): Payer: Medicare Other | Admitting: Physician Assistant

## 2016-09-07 DIAGNOSIS — Z23 Encounter for immunization: Secondary | ICD-10-CM | POA: Diagnosis not present

## 2016-09-08 NOTE — Progress Notes (Signed)
Pt needs a pneumococcal vaccine.

## 2016-09-11 ENCOUNTER — Other Ambulatory Visit: Payer: Self-pay

## 2016-09-11 MED ORDER — EZETIMIBE 10 MG PO TABS: 10.0000 mg | ORAL_TABLET | Freq: Every day | ORAL | 3 refills | Status: DC

## 2016-09-14 ENCOUNTER — Encounter: Payer: Self-pay | Admitting: *Deleted

## 2016-09-18 ENCOUNTER — Telehealth: Payer: Self-pay | Admitting: Internal Medicine

## 2016-09-18 MED ORDER — PITAVASTATIN CALCIUM 1 MG PO TABS: ORAL_TABLET | ORAL | 3 refills | Status: DC

## 2016-09-18 NOTE — Telephone Encounter (Signed)
Returned call to patient.Dr.Hilty's recommendations given.Advised to call back if she develops muscle pain.

## 2016-09-18 NOTE — Telephone Encounter (Signed)
Breanna Sharp is calling to see what Dr.Hilty wants her to do in regards to recent test results. Her cholesterol was high and glucose level was high.

## 2016-09-18 NOTE — Telephone Encounter (Signed)
Definitely stay on Zetia - could try livalo 1 mg daily. Given lack of known CAD - meaning that we are treating for primary prevention, the newer PCSK9 inhibitors would not be indicated or covered by insurance.   Dr. Debara Pickett

## 2016-09-18 NOTE — Telephone Encounter (Signed)
Returned cal to patient.She stated she is concerned about total cholesterol 256, LDL 167.Stated she is eating right and exercising.Stated she was unable to take Livalo 2mg  due to muscle pain.Stated she wanted to ask Dr.Hilty if she should take Livalo 1 mg and if she needs to continue taking Zetia.Message sent to Dr.Hilty for advice.

## 2016-09-26 ENCOUNTER — Other Ambulatory Visit: Payer: Self-pay | Admitting: Pulmonary Disease

## 2016-09-28 ENCOUNTER — Telehealth: Payer: Self-pay | Admitting: *Deleted

## 2016-09-28 NOTE — Telephone Encounter (Signed)
Prior auth for livalo approved through [id number].

## 2016-10-04 IMAGING — CR DG CHEST 2V
2 series · 2 of 2 positions shown · non-contrast
Comparison: None.

CLINICAL DATA: Cough

EXAM:
CHEST  2 VIEW

[PA]
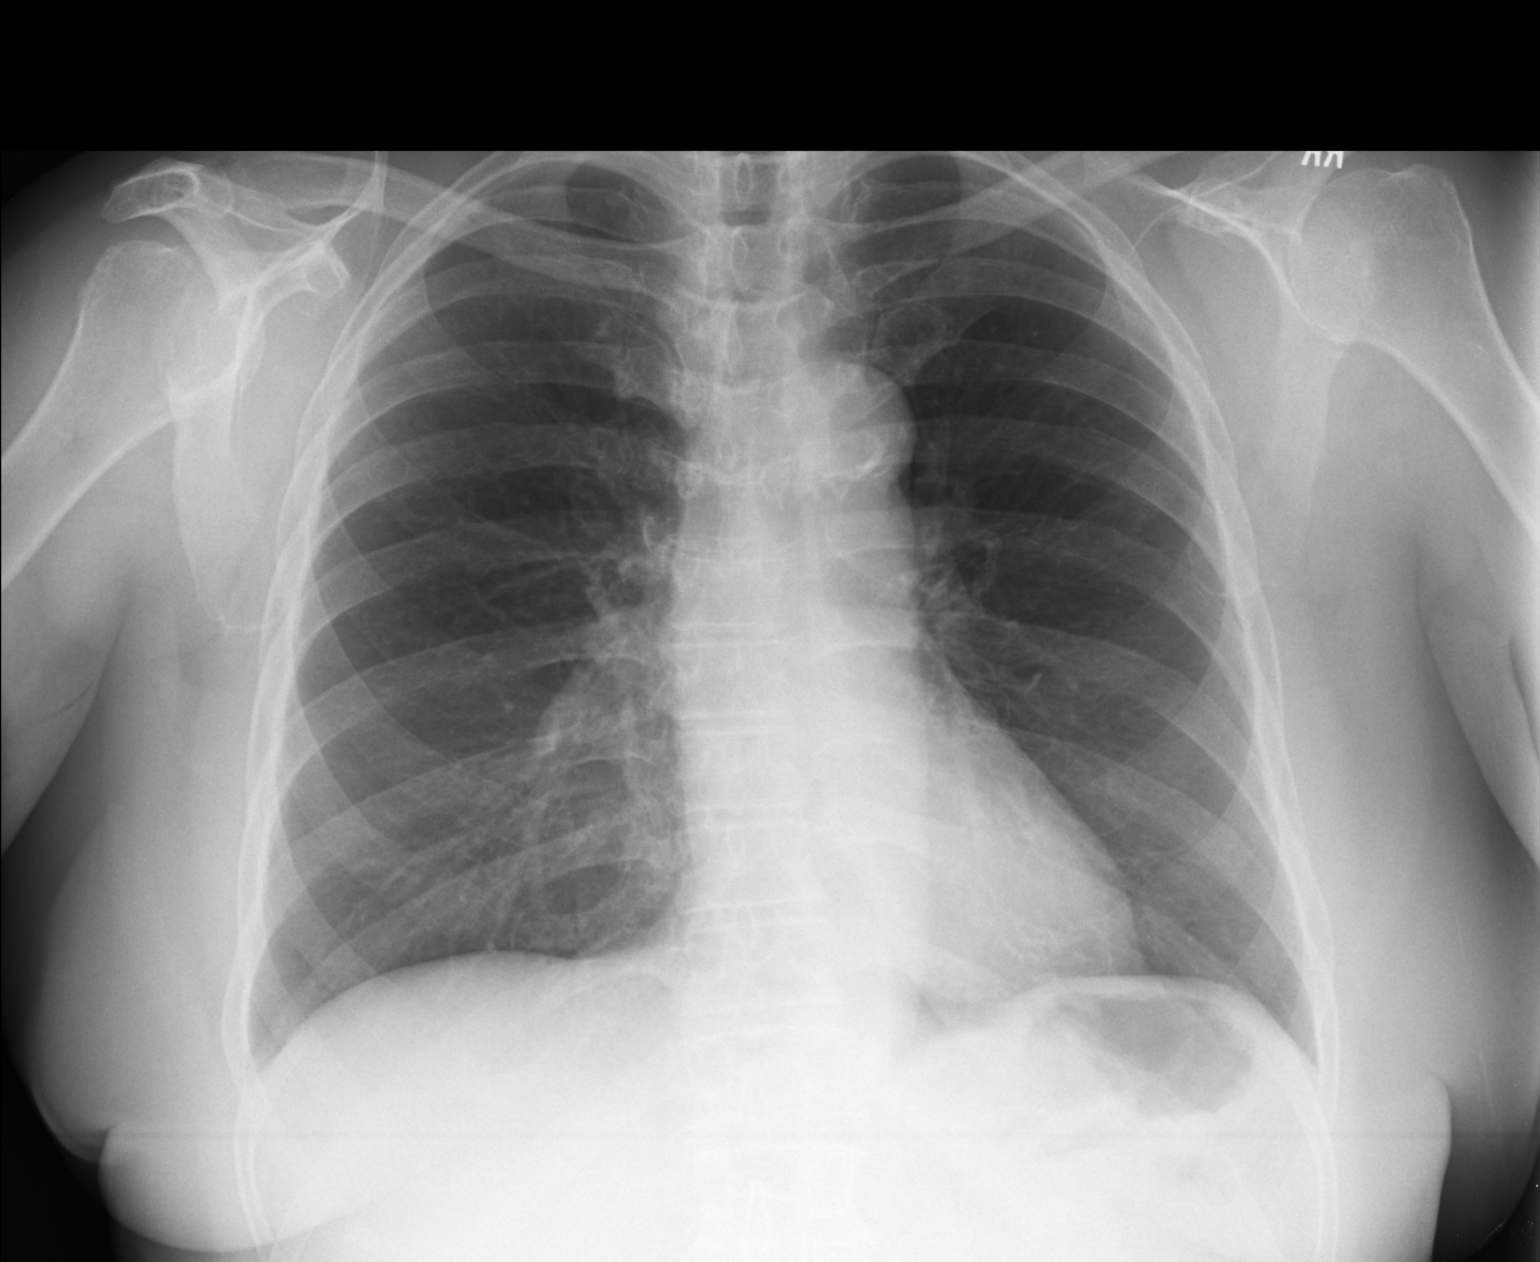

[lateral]
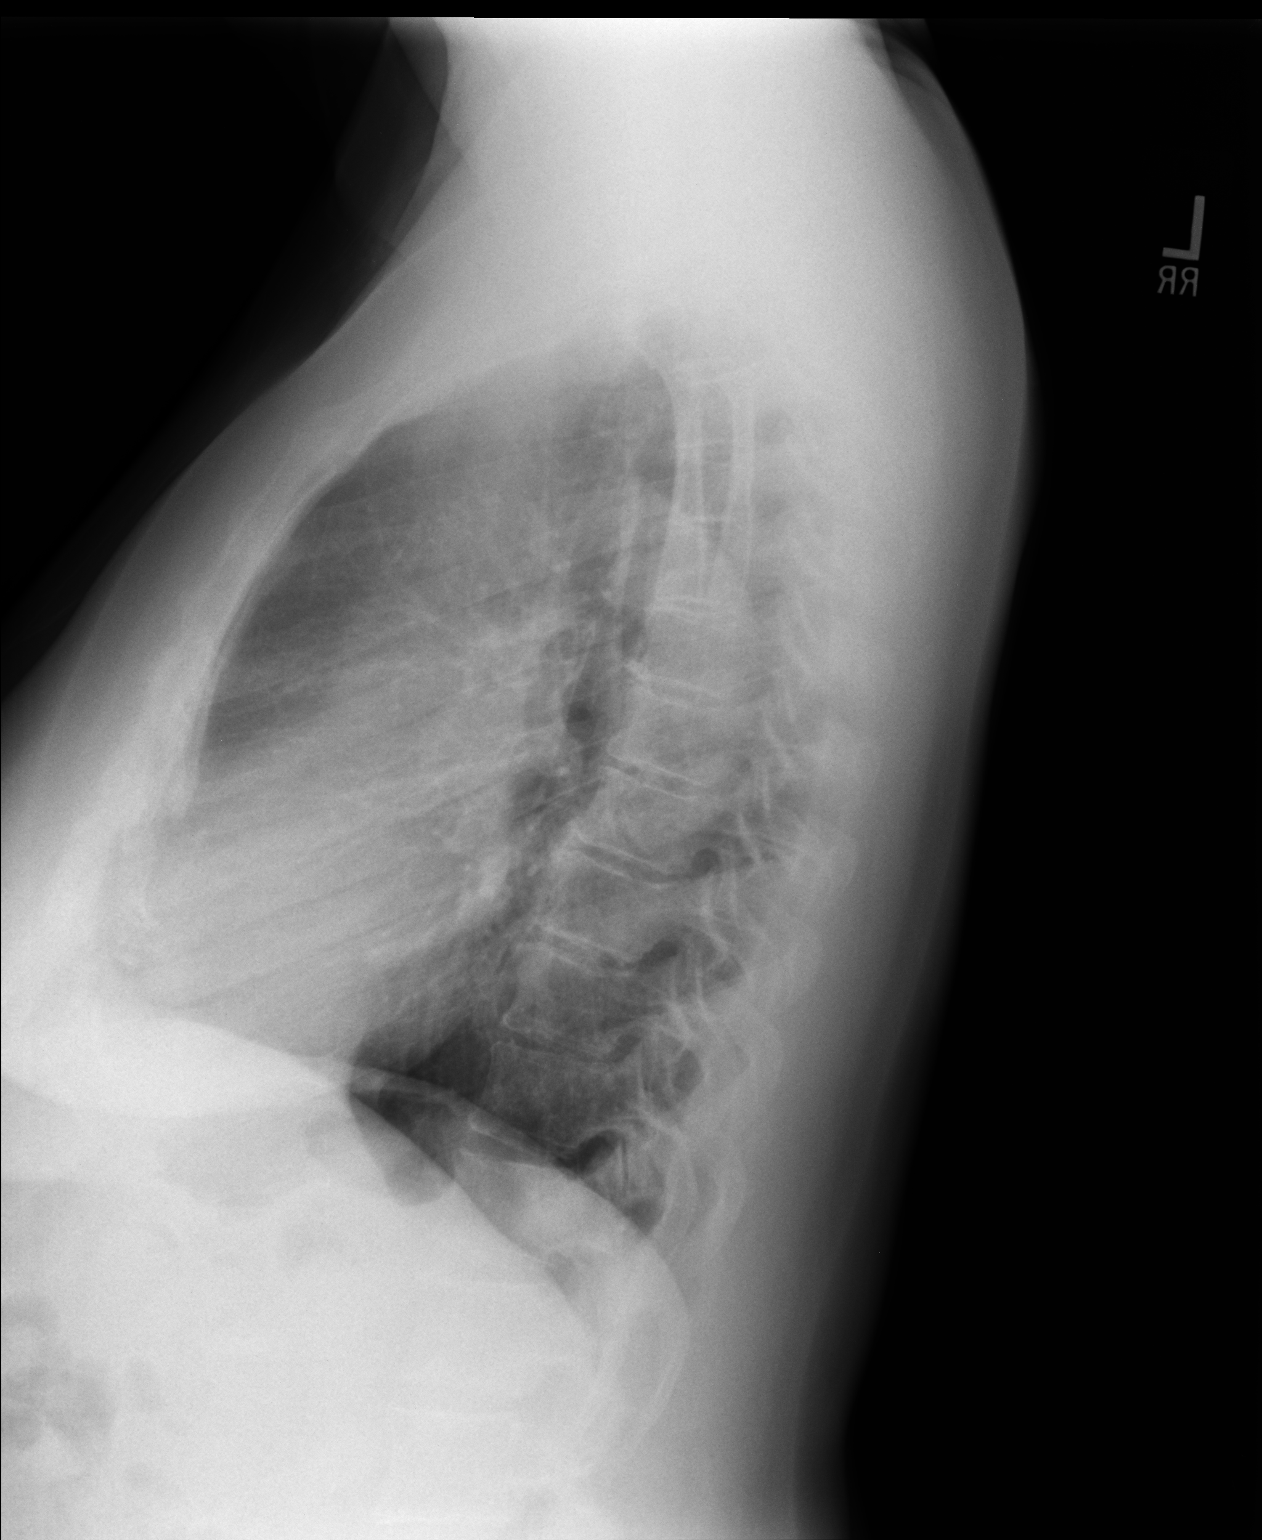

[2 of 2 positions shown; findings below may reference images not displayed]

FINDINGS: Normal heart size. Clear lungs. No pneumothorax. No pleural
effusion.
IMPRESSION: No active cardiopulmonary disease.

## 2016-10-31 ENCOUNTER — Ambulatory Visit (INDEPENDENT_AMBULATORY_CARE_PROVIDER_SITE_OTHER): Payer: Medicare Other | Admitting: Physician Assistant

## 2016-10-31 VITALS — BP 132/80 | HR 75 | Temp 98.3°F | Resp 16 | Ht 64.0 in | Wt 151.0 lb

## 2016-10-31 DIAGNOSIS — H60501 Unspecified acute noninfective otitis externa, right ear: Secondary | ICD-10-CM

## 2016-10-31 DIAGNOSIS — H6123 Impacted cerumen, bilateral: Secondary | ICD-10-CM

## 2016-10-31 MED ORDER — CIPROFLOXACIN-DEXAMETHASONE 0.3-0.1 % OT SUSP: 4.0000 [drp] | Freq: Two times a day (BID) | OTIC | 0 refills | Status: AC

## 2016-10-31 NOTE — Progress Notes (Signed)
[name]  MRN: [id number] DOB: 1951-04-19  Subjective:  Breanna Sharp is a 65 y.o. female seen in office today for a chief complaint of cerumen impaction x 10 days. Note she has a clogged feeling in both ears. Has decreased hearing bilaterally, slight discomfort and itching in right ear. Denies tinnutis.   She was here in 08/2016 for physical and had them irrigated but she feels like they are full again. This happens very frequently (3-4 times a year). She uses peroxide at home but does not feel like she has got it all out. Denies use of qtip.   Review of Systems  Constitutional: Negative for chills, diaphoresis, fatigue and fever.  HENT: Negative for congestion.   Eyes: Negative for visual disturbance.  Gastrointestinal: Negative for nausea and vomiting.  Neurological: Negative for dizziness, light-headedness and headaches.    Patient Active Problem List   Diagnosis Date Noted  . Dyslipidemia 05/11/2016  . Shortness of breath 05/11/2016  . Essential hypertension 05/11/2016  . Osteopenia 10/12/2015  . DOE (dyspnea on exertion) 05/02/2013  . Chronic sinusitis 05/02/2013  . Vertigo 08/14/2012  . Benign essential HTN 08/14/2012  . BMI 28.0-28.9,adult 08/14/2012  . COLONIC POLYPS 01/24/2008  . ACUT GASTR ULCER W/O MENTION HEMORR PERF/OBST 01/24/2008    Current Outpatient Prescriptions on File Prior to Visit  Medication Sig Dispense Refill  . calcium carbonate (TUMS EX) 750 MG chewable tablet Chew 1 tablet by mouth daily.    . cholecalciferol (VITAMIN D) 1000 UNITS tablet Take 2,000 Units by mouth daily.     [name] ezetimibe (ZETIA) 10 MG tablet Take 1 tablet (10 mg total) by mouth daily. 90 tablet 3  . fexofenadine (ALLEGRA) 180 MG tablet Take 1 tablet (180 mg total) by mouth daily. 90 tablet 3  . fluticasone (FLONASE) 50 MCG/ACT nasal spray Place 2 sprays into both nostrils daily. 48 g 6  . lisinopril-hydrochlorothiazide (PRINZIDE,ZESTORETIC) 10-12.5 MG tablet Take 1  tablet by mouth daily. 90 tablet 2  . Pitavastatin Calcium (LIVALO) 1 MG TABS Take 1 mg daily after dinner 90 tablet 3  . SPIRIVA RESPIMAT 2.5 MCG/ACT AERS USE 2 INHALATIONS DAILY 12 g 1  . Travoprost, BAK Free, (TRAVATAN) 0.004 % SOLN ophthalmic solution Place 1 drop into both eyes at bedtime.     No current facility-administered medications on file prior to visit.     Allergies  Allergen Reactions  . Statins     Muscle cramps     Objective:  BP 132/80 (BP Location: Right Arm, Patient Position: Sitting, Cuff Size: Normal)   Pulse 75   Temp 98.3 F (36.8 C) (Oral)   Resp 16   Ht 5\' 4"  (1.626 m)   Wt 151 lb (68.5 kg)   SpO2 98%   BMI 25.92 kg/m   Physical Exam  Constitutional: She is oriented to person, place, and time and well-developed, well-nourished, and in no distress.  HENT:  Head: Normocephalic and atraumatic.  Right Ear: There is drainage (yellow wet cerumen blocking view of TM).  Left Ear: There is drainage ( yellow wet cerumen blocking view of TM).  Nose: Nose normal.  Mouth/Throat: Uvula is midline and oropharynx is clear and moist.  Eyes: Conjunctivae are normal.  Neck: Normal range of motion.  Pulmonary/Chest: Effort normal.  Neurological: She is alert and oriented to person, place, and time. Gait normal.  Skin: Skin is warm and dry.  Psychiatric: Affect normal.  Vitals reviewed.  Post ear lavage: Left ear canal  and TM normal. Right ear canal with slight swelling and erythema, thick white discharge visualized, TM appears retracted and distorted,  no perforation noted.    Assessment and Plan :  1. Bilateral impacted cerumen - Due to the frequency of events, pt requests to see ENT specialist - Ear Lavage - Ambulatory referral to ENT  2. Acute otitis externa of right ear, unspecified type - ciprofloxacin-dexamethasone (CIPRODEX) otic suspension; Place 4 drops into the right ear 2 (two) times daily.  Dispense: 7.5 mL; Refill: 0 -Return to clinic if symptoms  worsen, do not improve, or as needed  Tenna Delaine PA-C  [facility] 10/31/2016 10:55 AM

## 2016-10-31 NOTE — Patient Instructions (Addendum)
ENT will contact you with appointment.,   To use the ear drops: -Lie down or tilt your head with your ear facing upward. Open the ear canal by gently pulling your ear back, or pulling downward on the earlobe when giving this medicine to a child. -Hold the dropper upside down over your ear and drop the correct number of drops into the ear. -Stay lying down or with your head tilted for at least 5 minutes. You may use a small piece of cotton to plug the ear and keep the medicine from draining out. -Do not touch the dropper tip or place it directly in your ear. It may become contaminated. Wipe the tip with a clean tissue but do not wash with water or soap. -Use this medicine for the full prescribed length of time. Your symptoms may improve before the infection is completely cleared. Skipping doses may also increase your risk of further infection that is resistant to antibiotics.   Otitis Externa Otitis externa is a germ infection in the outer ear. The outer ear is the area from the eardrum to the outside of the ear. Otitis externa is sometimes called "swimmer's ear." HOME CARE  Put drops in the ear as told by your doctor.  Only take medicine as told by your doctor.  If you have diabetes, your doctor may give you more directions. Follow your doctor's directions.  Keep all doctor visits as told. To avoid another infection:  Keep your ear dry. Use the corner of a towel to dry your ear after swimming or bathing.  Avoid scratching or putting things inside your ear.  Avoid swimming in lakes, dirty water, or pools that use a chemical called chlorine poorly.  You may use ear drops after swimming. Combine equal amounts of white vinegar and alcohol in a bottle. Put 3 or 4 drops in each ear. GET HELP IF:   You have a fever.  Your ear is still red, puffy (swollen), or painful after 3 days.  You still have yellowish-white fluid (pus) coming from the ear after 3 days.  Your redness, puffiness, or  pain gets worse.  You have a really bad headache.  You have redness, puffiness, pain, or tenderness behind your ear. MAKE SURE YOU:   Understand these instructions.  Will watch your condition.  Will get help right away if you are not doing well or get worse. This information is not intended to replace advice given to you by your health care provider. Make sure you discuss any questions you have with your health care provider. Document Released: 05/01/2008 Document Revised: 12/04/2014 Document Reviewed: 08/23/2015 Elsevier Interactive Patient Education  2017 [place] Your ears make a substance called earwax. It may also be called cerumen. Sometimes, too much earwax builds up in your ear canal. This can cause ear pain and make it harder for you to hear. CAUSES This condition is caused by too much earwax production or buildup. RISK FACTORS The following factors may make you more likely to develop this condition:  Cleaning your ears often with swabs.  Having narrow ear canals.  Having earwax that is overly thick or sticky.  Having eczema.  Being dehydrated. SYMPTOMS Symptoms of this condition include:  Reduced hearing.  Ear drainage.  Ear pain.  Ear itch.  A feeling of fullness in the ear or feeling that the ear is plugged.  Ringing in the ear.  Coughing. DIAGNOSIS Your health care provider can diagnose this condition based on  your symptoms and medical history. Your health care provider will also do an ear exam to look inside your ear with a scope (otoscope). You may also have a hearing test. TREATMENT Treatment for this condition includes:  Over-the-counter or prescription ear drops to soften the earwax.  Earwax removal by a health care provider. This may be done:  By flushing the ear with body-temperature water.  With a medical instrument that has a loop at the end (earwax curette).  With a suction device. HOME CARE  INSTRUCTIONS  Take over-the-counter and prescription medicines only as told by your health care provider.  Do not put any objects, including an ear swab, into your ear. You can clean the opening of your ear canal with a washcloth.  Drink enough water to keep your urine clear or pale yellow.  If you have frequent earwax buildup or you use hearing aids, consider seeing your health care provider every 6-12 months for routine preventive ear cleanings. Keep all follow-up visits as told by your health care provider. SEEK MEDICAL CARE IF:  You have ear pain.  Your condition does not improve with treatment.  You have hearing loss.  You have blood, pus, or other fluid coming from your ear. This information is not intended to replace advice given to you by your health care provider. Make sure you discuss any questions you have with your health care provider. Document Released: 12/21/2004 Document Revised: 03/06/2016 Document Reviewed: 06/30/2015 Elsevier Interactive Patient Education  2017 [employer].    IF you received an x-ray today, you will receive an invoice from [facility]. Please contact [facility] at [phone] with questions or concerns regarding your invoice.   IF you received labwork today, you will receive an invoice from [employer]. Please contact [facility] at [phone] with questions or concerns regarding your invoice.   Our billing staff will not be able to assist you with questions regarding bills from these companies.  You will be contacted with the lab results as soon as they are available. The fastest way to get your results is to activate your My Chart account. Instructions are located on the last page of this paperwork. If you have not heard from [place] regarding the results in 2 weeks, please contact this office.

## 2016-11-27 HISTORY — PX: POLYPECTOMY: SHX149

## 2016-12-05 DIAGNOSIS — H2513 Age-related nuclear cataract, bilateral: Secondary | ICD-10-CM | POA: Diagnosis not present

## 2016-12-05 DIAGNOSIS — H40003 Preglaucoma, unspecified, bilateral: Secondary | ICD-10-CM | POA: Diagnosis not present

## 2016-12-05 DIAGNOSIS — H43813 Vitreous degeneration, bilateral: Secondary | ICD-10-CM | POA: Diagnosis not present

## 2016-12-05 DIAGNOSIS — H18413 Arcus senilis, bilateral: Secondary | ICD-10-CM | POA: Diagnosis not present

## 2016-12-05 DIAGNOSIS — H401131 Primary open-angle glaucoma, bilateral, mild stage: Secondary | ICD-10-CM | POA: Diagnosis not present

## 2016-12-13 ENCOUNTER — Ambulatory Visit: Payer: TRICARE For Life (TFL) | Admitting: Pulmonary Disease

## 2016-12-26 DIAGNOSIS — H6123 Impacted cerumen, bilateral: Secondary | ICD-10-CM | POA: Diagnosis not present

## 2016-12-27 ENCOUNTER — Encounter: Payer: Self-pay | Admitting: Pulmonary Disease

## 2016-12-27 ENCOUNTER — Ambulatory Visit (INDEPENDENT_AMBULATORY_CARE_PROVIDER_SITE_OTHER): Payer: Medicare Other | Admitting: Pulmonary Disease

## 2016-12-27 VITALS — BP 120/62 | HR 93 | Ht 64.0 in | Wt 152.0 lb

## 2016-12-27 DIAGNOSIS — R0609 Other forms of dyspnea: Secondary | ICD-10-CM | POA: Diagnosis not present

## 2016-12-27 DIAGNOSIS — R0600 Dyspnea, unspecified: Secondary | ICD-10-CM

## 2016-12-27 NOTE — Progress Notes (Signed)
Breanna Sharp    EB:[id number]    [birth year]  Primary Care Physician:GREENE,JEFFREY R, MD  Referring Physician: Leandrew Koyanagi, MD No address on file  Chief complaint:   Follow up for COPD  HPI: Mrs. Kawaguchi is a 66 year old with past medical history of hypertension. She is here for follow up of dyspnea on exertion for the past 2 years. His symptoms mostly occur when she is walking up an incline or climbing stairs. She usually gets winded after 1 flight of stairs. She does not have any associated wheeze, cough, sputum production. The symptoms do not occur at rest. She was evaluated with a cardiopulmonary exercise test on 04/22/13. The results showed good exercise capacity. The peak VO2 of 108% of predicted. There are no obvious pulmonary abnormalities. She had a good breathing reserve.   Interim History: She feels well and continues to exercise regularly. She feels that she is improving the Spiriva has helped with her breathing. She denies any cough, sputum production, wheezing.    Outpatient Encounter Prescriptions as of 12/27/2016  Medication Sig  . calcium carbonate (TUMS EX) 750 MG chewable tablet Chew 1 tablet by mouth daily.  . cholecalciferol (VITAMIN D) 1000 UNITS tablet Take 2,000 Units by mouth daily.   [name] ezetimibe (ZETIA) 10 MG tablet Take 1 tablet (10 mg total) by mouth daily.  . fexofenadine (ALLEGRA) 180 MG tablet Take 1 tablet (180 mg total) by mouth daily.  . fluticasone (FLONASE) 50 MCG/ACT nasal spray Place 2 sprays into both nostrils daily.  [name] lisinopril-hydrochlorothiazide (PRINZIDE,ZESTORETIC) 10-12.5 MG tablet Take 1 tablet by mouth daily.  . Pitavastatin Calcium (LIVALO) 1 MG TABS Take 1 mg daily after dinner  . SPIRIVA RESPIMAT 2.5 MCG/ACT AERS USE 2 INHALATIONS DAILY  . Travoprost, BAK Free, (TRAVATAN) 0.004 % SOLN ophthalmic solution Place 1 drop into both eyes at bedtime.   No facility-administered encounter medications on file as of  12/27/2016.     Allergies as of 12/27/2016 - Review Complete 12/27/2016  Allergen Reaction Noted  . Statins  05/15/2012    Past Medical History:  Diagnosis Date  . Arthritis   . Dyslipidemia    statin intolerance  . Hypertension   . Vertigo     Past Surgical History:  Procedure Laterality Date  . ABDOMINAL HYSTERECTOMY  1972   cervical cancer    Family History  Problem Relation Age of Onset  . Hypertension Mother     dx in her 66's  . Heart attack Mother 34  . Heart attack Brother 42  . Diabetes Brother   . Hyperlipidemia Brother   . Hypertension Brother   . Heart disease Brother     Social History   Social History  . Marital status: Married    Spouse name: [name]  . Number of children: 0  . Years of education: 12   Occupational History  .  Retired    Retired   Social History Main Topics  . Smoking status: Former Smoker    Packs/day: 0.50    Years: 32.00    Types: Cigarettes    Quit date: 11/27/2002  . Smokeless tobacco: Never Used  . Alcohol use 1.8 oz/week    3 Standard drinks or equivalent per week     Comment: social - 2 or 3 times a month 1-2 glasses of wine  . Drug use: No  . Sexual activity: Yes    Partners: Male   Other Topics Concern  .  Not on file   Social History Narrative   Exercise 3 to 4 times/week walking for 45 min -1 hour   Patient lives at home with her husband Legrand Como).   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   Right handed.   decaffeine Green Tea.    Review of systems: Review of Systems  Constitutional: Negative for fever and chills.  HENT: Negative.   Eyes: Negative for blurred vision.  Respiratory: as per HPI  Cardiovascular: Negative for chest pain and palpitations.  Gastrointestinal: Negative for vomiting, diarrhea, blood per rectum. Genitourinary: Negative for dysuria, urgency, frequency and hematuria.  Musculoskeletal:  Negative for myalgias, back pain and joint pain.  Skin: Negative for itching and rash.  Neurological: Negative for dizziness, tremors, focal weakness, seizures and loss of consciousness.  Endo/Heme/Allergies: Negative for environmental allergies.  Psychiatric/Behavioral: Negative for depression, suicidal ideas and hallucinations.  All other systems reviewed and are negative.  Physical Exam: Blood pressure 120/62, pulse 93, height 5\' 4"  (1.626 m), weight 152 lb (68.9 kg), SpO2 97 %. Gen:      No acute distress HEENT:  EOMI, sclera anicteric Neck:     No masses; no thyromegaly Lungs:    Clear to auscultation bilaterally; normal respiratory effort CV:         Regular rate and rhythm; no murmurs Abd:      + bowel sounds; soft, non-tender; no palpable masses, no distension Ext:    No edema; adequate peripheral perfusion Skin:      Warm and dry; no rash Neuro: alert and oriented x 3 Psych: normal mood and affect  Data Reviewed: CXR (09/01/15) No active cardiopulmonary disease. Images reviewed  PFTs 04/22/13 FVC 1.91 (73%) FEV1 1.44 (70%) F/F 75 SVC 2.05 (78%) DLCO 66%  PFTs 12/17/15 FVC 2.15 [84%) FEV1 1.70 (85%] F/F 79 TLC 93% DLCO 68% Minimal obstruction, mild reduction in diffusion  Echo 12/29/15 LV. Normal size and function. EF 55-60 percent. No evidence of pulmonary hypertension  Assessment:  Mild COPD Her symptoms are likely secondary to COPD. There is not any obstruction on PFTs by F/F criteria but there is evidence of small airway disease as shown by reduction in mid flow rates and curvature of the flow volume loop. She does have an extensive smoking history and has responded well to Spiriva. We will continue the same.  She had a cardiopulmonary stress test in 2014 which was entirely normal and she exceeded her predicted. VO2 max. There is no evidence of any pulmonary impairment. The PFTs show a reduction in DLCO which corrects for alveolar volume. Echo does not show  any pulmonary hypertension.  Plan/Recommendations: - Continue spiriva  Marshell Garfinkel MD [facility] Pager [phone] 12/27/2016, 5:02 PM  CC: Leandrew Koyanagi, MD  x

## 2016-12-27 NOTE — Patient Instructions (Signed)
Continue using her Spiriva  Return to clinic in 6 months

## 2017-01-19 DIAGNOSIS — R42 Dizziness and giddiness: Secondary | ICD-10-CM

## 2017-02-19 DIAGNOSIS — H18413 Arcus senilis, bilateral: Secondary | ICD-10-CM | POA: Diagnosis not present

## 2017-02-19 DIAGNOSIS — H43813 Vitreous degeneration, bilateral: Secondary | ICD-10-CM | POA: Diagnosis not present

## 2017-02-19 DIAGNOSIS — H2513 Age-related nuclear cataract, bilateral: Secondary | ICD-10-CM | POA: Diagnosis not present

## 2017-02-19 DIAGNOSIS — H40003 Preglaucoma, unspecified, bilateral: Secondary | ICD-10-CM | POA: Diagnosis not present

## 2017-03-08 ENCOUNTER — Encounter: Payer: Self-pay | Admitting: Family Medicine

## 2017-03-08 ENCOUNTER — Ambulatory Visit (INDEPENDENT_AMBULATORY_CARE_PROVIDER_SITE_OTHER): Payer: Medicare Other | Admitting: Family Medicine

## 2017-03-08 VITALS — BP 116/71 | HR 77 | Temp 97.8°F | Ht 64.0 in | Wt 150.0 lb

## 2017-03-08 DIAGNOSIS — J069 Acute upper respiratory infection, unspecified: Secondary | ICD-10-CM

## 2017-03-08 DIAGNOSIS — R05 Cough: Secondary | ICD-10-CM

## 2017-03-08 DIAGNOSIS — R059 Cough, unspecified: Secondary | ICD-10-CM

## 2017-03-08 MED ORDER — BENZONATATE 100 MG PO CAPS: 100.0000 mg | ORAL_CAPSULE | Freq: Three times a day (TID) | ORAL | 0 refills | Status: DC | PRN

## 2017-03-08 NOTE — Progress Notes (Signed)
Subjective:  This chart was scribed for Breanna Agreste, MD by Tamsen Roers, at [facility].  This patient was seen in room 25 and the patient's care was started at 12:01 PM.   Chief Complaint  Patient presents with  . Sinus Problem    X 6 days     Patient ID: Breanna Sharp, female    DOB: [birth year], 66 y.o.   MRN: [id number]  HPI HPI Comments: Breanna Sharp is a 66 y.o. female who presents to [facility] Care at [facility] with sinus issues onset about 5 days ago.  She has symptoms of rhinorrhea, a productive cough-mainly at night time with clear phlegm and itchy throat.  Her symptoms first started with sneezing/draining and the next day, she had increased congestion and coughing.  She took robitussin on Sunday for her cough- (which is the only medication she took for her cough).   Denies a fever/chills, SOB or wheezing.   Patient does not have any other family members in her home with similar symptoms. Patient uses allegra and flonase regularly for her allergies and she has still been using them during her symptoms. She has been drinking plenty of fluids.   Patient is still using her Spiriva. No new wheezing/dyspnea.    Patient Active Problem List   Diagnosis Date Noted  . Dyslipidemia 05/11/2016  . Shortness of breath 05/11/2016  . Essential hypertension 05/11/2016  . Osteopenia 10/12/2015  . DOE (dyspnea on exertion) 05/02/2013  . Chronic sinusitis 05/02/2013  . Vertigo 08/14/2012  . Benign essential HTN 08/14/2012  . BMI 28.0-28.9,adult 08/14/2012  . COLONIC POLYPS 01/24/2008  . ACUT GASTR ULCER W/O MENTION HEMORR PERF/OBST 01/24/2008   Past Medical History:  Diagnosis Date  . Arthritis   . Dyslipidemia    statin intolerance  . Hypertension   . Vertigo    Past Surgical History:  Procedure Laterality Date  . ABDOMINAL HYSTERECTOMY  1972   cervical cancer   Allergies  Allergen Reactions  . Statins     Muscle cramps   Prior to Admission  medications   Medication Sig Start Date End Date Taking? Authorizing Provider  calcium carbonate (TUMS EX) 750 MG chewable tablet Chew 1 tablet by mouth daily.   Yes Historical Provider, MD  cholecalciferol (VITAMIN D) 1000 UNITS tablet Take 2,000 Units by mouth daily.    Yes Historical Provider, MD  ezetimibe (ZETIA) 10 MG tablet Take 1 tablet (10 mg total) by mouth daily. 09/11/16  Yes Pixie Casino, MD  fexofenadine (ALLEGRA) 180 MG tablet Take 1 tablet (180 mg total) by mouth daily. 10/04/13  Yes Robyn Haber, MD  fluticasone (FLONASE) 50 MCG/ACT nasal spray Place 2 sprays into both nostrils daily. 08/31/16  Yes Breanna Agreste, MD  lisinopril-hydrochlorothiazide (PRINZIDE,ZESTORETIC) 10-12.5 MG tablet Take 1 tablet by mouth daily. 08/31/16  Yes Breanna Agreste, MD  Pitavastatin Calcium (LIVALO) 1 MG TABS Take 1 mg daily after dinner 09/18/16  Yes Pixie Casino, MD  SPIRIVA RESPIMAT 2.5 MCG/ACT AERS USE 2 INHALATIONS DAILY 09/28/16  Yes Praveen Mannam, MD  Travoprost, BAK Free, (TRAVATAN) 0.004 % SOLN ophthalmic solution Place 1 drop into both eyes at bedtime.   Yes Historical Provider, MD   Social History   Social History  . Marital status: Married    Spouse name: Breanna Sharp  . Number of children: 0  . Years of education: 12   Occupational History  .  Retired    Retired  Social History Main Topics  . Smoking status: Former Smoker    Packs/day: 0.50    Years: 32.00    Types: Cigarettes    Quit date: 11/27/2002  . Smokeless tobacco: Never Used  . Alcohol use 1.8 oz/week    3 Standard drinks or equivalent per week     Comment: social - 2 or 3 times a month 1-2 glasses of wine  . Drug use: No  . Sexual activity: Yes    Partners: Male   Other Topics Concern  . Not on file   Social History Narrative   Exercise 3 to 4 times/week walking for 45 min -1 hour   Patient lives at home with her husband Breanna Sharp).   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   Right handed.   decaffeine Green Tea.      Review of Systems  Constitutional: Negative for chills and fever.  HENT: Positive for congestion, rhinorrhea and sneezing.   Eyes: Negative for pain and redness.  Respiratory: Positive for cough. Negative for shortness of breath and wheezing.   Cardiovascular: Negative for chest pain.  Gastrointestinal: Negative for nausea and vomiting.  Musculoskeletal: Negative for neck pain and neck stiffness.  Skin: Negative for color change.  Neurological: Negative for speech difficulty.       Objective:   Physical Exam  Constitutional: She is oriented to person, place, and time. She appears well-developed and well-nourished. No distress.  HENT:  Head: Normocephalic and atraumatic.  Right Ear: Hearing, tympanic membrane, external ear and ear canal normal.  Left Ear: Hearing, tympanic membrane, external ear and ear canal normal.  Nose: Nose normal.  Mouth/Throat: Oropharynx is clear and moist. No oropharyngeal exudate.  Moist oral mucosa, no erythema.  Edema of the turbinates bilaterally, no discharge.  Sinuses are non tender.   Eyes: Conjunctivae and EOM are normal. Pupils are equal, round, and reactive to light.  Cardiovascular: Normal rate, regular rhythm, normal heart sounds and intact distal pulses.   No murmur heard. Pulmonary/Chest: Effort normal and breath sounds normal. No respiratory distress. She has no wheezes. She has no rhonchi.  Neurological: She is alert and oriented to person, place, and time.  Skin: Skin is warm and dry. No rash noted.  Psychiatric: She has a normal mood and affect. Her behavior is normal.  Vitals reviewed.  Vitals:   03/08/17 1054  BP: 116/71  Pulse: 77  Temp: 97.8 F (36.6 C)  TempSrc: Oral  SpO2: 99%  Weight: 150 lb (68 kg)  Height: 5\' 4"  (1.626 m)       Assessment & Plan:    [name] is a 66 y.o. female Cough -  Plan: benzonatate (TESSALON) 100 MG capsule  Acute upper respiratory infection - Plan: benzonatate (TESSALON) 100 MG capsule  Suspected viral upper respiratory infection with slight cough. Lungs were clear, no increased wheeze. Symptomatic care discussed, Tessalon Perles given. RTC precauitons.   Meds ordered this encounter  Medications  . benzonatate (TESSALON) 100 MG capsule    Sig: Take 1 capsule (100 mg total) by mouth 3 (three) times daily as needed for cough.    Dispense:  20 capsule    Refill:  0   Patient Instructions   Saline nasal spray 4 times per day, over the counter mucinex or mucinex DM OR tessalon perles if needed for cough, drink plenty of fluids.   Return to  the clinic or go to the nearest emergency room if any of your symptoms worsen or new symptoms occur.   Upper Respiratory Infection, Adult Most upper respiratory infections (URIs) are a viral infection of the air passages leading to the lungs. A URI affects the nose, throat, and upper air passages. The most common type of URI is nasopharyngitis and is typically referred to as "the common cold." URIs run their course and usually go away on their own. Most of the time, a URI does not require medical attention, but sometimes a bacterial infection in the upper airways can follow a viral infection. This is called a secondary infection. Sinus and middle ear infections are common types of secondary upper respiratory infections. Bacterial pneumonia can also complicate a URI. A URI can worsen asthma and chronic obstructive pulmonary disease (COPD). Sometimes, these complications can require emergency medical care and may be life threatening. What are the causes? Almost all URIs are caused by viruses. A virus is a type of germ and can spread from one person to another. What increases the risk? You may be at risk for a URI if:  You smoke.  You have chronic heart or lung disease.  You have a weakened defense (immune)  system.  You are very young or very old.  You have nasal allergies or asthma.  You work in crowded or poorly ventilated areas.  You work in health care facilities or schools. What are the signs or symptoms? Symptoms typically develop 2-3 days after you come in contact with a cold virus. Most viral URIs last 7-10 days. However, viral URIs from the influenza virus (flu virus) can last 14-18 days and are typically more severe. Symptoms may include:  Runny or stuffy (congested) nose.  Sneezing.  Cough.  Sore throat.  Headache.  Fatigue.  Fever.  Loss of appetite.  Pain in your forehead, behind your eyes, and over your cheekbones (sinus pain).  Muscle aches. How is this diagnosed? Your health care provider may diagnose a URI by:  Physical exam.  Tests to check that your symptoms are not due to another condition such as:  Strep throat.  Sinusitis.  Pneumonia.  Asthma. How is this treated? A URI goes away on its own with time. It cannot be cured with medicines, but medicines may be prescribed or recommended to relieve symptoms. Medicines may help:  Reduce your fever.  Reduce your cough.  Relieve nasal congestion. Follow these instructions at home:  Take medicines only as directed by your health care provider.  Gargle warm saltwater or take cough drops to comfort your throat as directed by your health care provider.  Use a warm mist humidifier or inhale steam from a shower to increase air moisture. This may make it easier to breathe.  Drink enough fluid to keep your urine clear or pale yellow.  Eat soups and other clear broths and maintain good nutrition.  Rest as needed.  Return to work when your temperature has returned to normal or as your health care provider advises. You may need to stay home longer to avoid infecting others. You can also use a face mask and careful hand washing to prevent spread of the virus.  Increase the usage of your inhaler if  you have asthma.  Do not use any tobacco products, including cigarettes, chewing tobacco, or electronic cigarettes. If you need help quitting, ask your health care provider. How is this prevented? The best way to protect yourself from getting a cold is  to practice good hygiene.  Avoid oral or hand contact with people with cold symptoms.  Wash your hands often if contact occurs. There is no clear evidence that vitamin C, vitamin E, echinacea, or exercise reduces the chance of developing a cold. However, it is always recommended to get plenty of rest, exercise, and practice good nutrition. Contact a health care provider if:  You are getting worse rather than better.  Your symptoms are not controlled by medicine.  You have chills.  You have worsening shortness of breath.  You have brown or red mucus.  You have yellow or brown nasal discharge.  You have pain in your face, especially when you bend forward.  You have a fever.  You have swollen neck glands.  You have pain while swallowing.  You have white areas in the back of your throat. Get help right away if:  You have severe or persistent:  Headache.  Ear pain.  Sinus pain.  Chest pain.  You have chronic lung disease and any of the following:  Wheezing.  Prolonged cough.  Coughing up blood.  A change in your usual mucus.  You have a stiff neck.  You have changes in your:  Vision.  Hearing.  Thinking.  Mood. This information is not intended to replace advice given to you by your health care provider. Make sure you discuss any questions you have with your health care provider. Document Released: 05/09/2001 Document Revised: 07/16/2016 Document Reviewed: 02/18/2014 Elsevier Interactive Patient Education  2017 [employer].   IF you received an x-ray today, you will receive an invoice from [facility]. Please contact [facility] at [phone] with questions or concerns regarding  your invoice.   IF you received labwork today, you will receive an invoice from [facility]. Please contact [facility] at [phone] with questions or concerns regarding your invoice.   Our billing staff will not be able to assist you with questions regarding bills from these companies.  You will be contacted with the lab results as soon as they are available. The fastest way to get your results is to activate your My Chart account. Instructions are located on the last page of this paperwork. If you have not heard from [place] regarding the results in 2 weeks, please contact this office.       I personally performed the services described in this documentation, which was scribed in my presence. The recorded information has been reviewed and considered for accuracy and completeness, addended by me as needed, and agree with information above.  Signed,   [name], MD [facility] [place].  03/08/17 12:16 PM

## 2017-03-08 NOTE — Patient Instructions (Addendum)
Saline nasal spray 4 times per day, over the counter mucinex or mucinex DM OR tessalon perles if needed for cough, drink plenty of fluids.   Return to the clinic or go to the nearest emergency room if any of your symptoms worsen or new symptoms occur.   Upper Respiratory Infection, Adult Most upper respiratory infections (URIs) are a viral infection of the air passages leading to the lungs. A URI affects the nose, throat, and upper air passages. The most common type of URI is nasopharyngitis and is typically referred to as "the common cold." URIs run their course and usually go away on their own. Most of the time, a URI does not require medical attention, but sometimes a bacterial infection in the upper airways can follow a viral infection. This is called a secondary infection. Sinus and middle ear infections are common types of secondary upper respiratory infections. Bacterial pneumonia can also complicate a URI. A URI can worsen asthma and chronic obstructive pulmonary disease (COPD). Sometimes, these complications can require emergency medical care and may be life threatening. What are the causes? Almost all URIs are caused by viruses. A virus is a type of germ and can spread from one person to another. What increases the risk? You may be at risk for a URI if:  You smoke.  You have chronic heart or lung disease.  You have a weakened defense (immune) system.  You are very young or very old.  You have nasal allergies or asthma.  You work in crowded or poorly ventilated areas.  You work in health care facilities or schools. What are the signs or symptoms? Symptoms typically develop 2-3 days after you come in contact with a cold virus. Most viral URIs last 7-10 days. However, viral URIs from the influenza virus (flu virus) can last 14-18 days and are typically more severe. Symptoms may include:  Runny or stuffy (congested) nose.  Sneezing.  Cough.  Sore  throat.  Headache.  Fatigue.  Fever.  Loss of appetite.  Pain in your forehead, behind your eyes, and over your cheekbones (sinus pain).  Muscle aches. How is this diagnosed? Your health care provider may diagnose a URI by:  Physical exam.  Tests to check that your symptoms are not due to another condition such as:  Strep throat.  Sinusitis.  Pneumonia.  Asthma. How is this treated? A URI goes away on its own with time. It cannot be cured with medicines, but medicines may be prescribed or recommended to relieve symptoms. Medicines may help:  Reduce your fever.  Reduce your cough.  Relieve nasal congestion. Follow these instructions at home:  Take medicines only as directed by your health care provider.  Gargle warm saltwater or take cough drops to comfort your throat as directed by your health care provider.  Use a warm mist humidifier or inhale steam from a shower to increase air moisture. This may make it easier to breathe.  Drink enough fluid to keep your urine clear or pale yellow.  Eat soups and other clear broths and maintain good nutrition.  Rest as needed.  Return to work when your temperature has returned to normal or as your health care provider advises. You may need to stay home longer to avoid infecting others. You can also use a face mask and careful hand washing to prevent spread of the virus.  Increase the usage of your inhaler if you have asthma.  Do not use any tobacco products, including cigarettes, chewing tobacco,  or electronic cigarettes. If you need help quitting, ask your health care provider. How is this prevented? The best way to protect yourself from getting a cold is to practice good hygiene.  Avoid oral or hand contact with people with cold symptoms.  Wash your hands often if contact occurs. There is no clear evidence that vitamin C, vitamin E, echinacea, or exercise reduces the chance of developing a cold. However, it is always  recommended to get plenty of rest, exercise, and practice good nutrition. Contact a health care provider if:  You are getting worse rather than better.  Your symptoms are not controlled by medicine.  You have chills.  You have worsening shortness of breath.  You have brown or red mucus.  You have yellow or brown nasal discharge.  You have pain in your face, especially when you bend forward.  You have a fever.  You have swollen neck glands.  You have pain while swallowing.  You have white areas in the back of your throat. Get help right away if:  You have severe or persistent:  Headache.  Ear pain.  Sinus pain.  Chest pain.  You have chronic lung disease and any of the following:  Wheezing.  Prolonged cough.  Coughing up blood.  A change in your usual mucus.  You have a stiff neck.  You have changes in your:  Vision.  Hearing.  Thinking.  Mood. This information is not intended to replace advice given to you by your health care provider. Make sure you discuss any questions you have with your health care provider. Document Released: 05/09/2001 Document Revised: 07/16/2016 Document Reviewed: 02/18/2014 Elsevier Interactive Patient Education  2017 [employer].   IF you received an x-ray today, you will receive an invoice from [facility]. Please contact [facility] at [phone] with questions or concerns regarding your invoice.   IF you received labwork today, you will receive an invoice from [place]. Please contact [facility] at [phone] with questions or concerns regarding your invoice.   Our billing staff will not be able to assist you with questions regarding bills from these companies.  You will be contacted with the lab results as soon as they are available. The fastest way to get your results is to activate your My Chart account. Instructions are located on the last page of this paperwork. If you have not heard from [place]  regarding the results in 2 weeks, please contact this office.

## 2017-04-06 ENCOUNTER — Other Ambulatory Visit: Payer: Self-pay | Admitting: Pulmonary Disease

## 2017-04-18 ENCOUNTER — Telehealth: Payer: Self-pay | Admitting: Physician Assistant

## 2017-04-18 ENCOUNTER — Encounter: Payer: Self-pay | Admitting: Physician Assistant

## 2017-04-18 ENCOUNTER — Ambulatory Visit (INDEPENDENT_AMBULATORY_CARE_PROVIDER_SITE_OTHER): Payer: Medicare Other | Admitting: Physician Assistant

## 2017-04-18 VITALS — BP 150/79 | HR 80 | Temp 97.9°F | Resp 16 | Ht 64.0 in | Wt 149.8 lb

## 2017-04-18 DIAGNOSIS — R030 Elevated blood-pressure reading, without diagnosis of hypertension: Secondary | ICD-10-CM | POA: Diagnosis not present

## 2017-04-18 DIAGNOSIS — M545 Low back pain, unspecified: Secondary | ICD-10-CM

## 2017-04-18 MED ORDER — BACLOFEN 10 MG PO TABS: 5.0000 mg | ORAL_TABLET | Freq: Three times a day (TID) | ORAL | 0 refills | Status: DC

## 2017-04-18 NOTE — Progress Notes (Deleted)
Subjective:    Breanna Sharp is a 66 y.o. female who presents for evaluation of low back pain. The patient has had recurrent self limited episodes of low back pain in the past. Symptoms have been present for 3 days and are gradually improving.  Onset was related to / precipitated by lifting a heavy object. The pain is located in the across the lower back and does not radiate. The pain is described as aching and occurs intermittently.  Symptoms are improved by heat and stretching. She has also tried acetaminophen and NSAIDs which provided intermittent relief. Pain is worsened with bending and sitting for long periods. She denies weakness in the right leg, weakness in the left leg, tingling in the right leg, tingling in the left leg, burning pain in the right leg, burning pain in the left leg, urinary hesitancy, urinary incontinence, urinary retention, hematuria, bowel incontinence, constipation, impotence and groin/perineal numbness. The patient has no "red flag" history indicative of complicated back pain.Breanna Sharp Has hx of stomach ulcers so when she takes NSAIDs she gets epigastric discomfort and nausea, which occurred today when she tried aleve.   {Common ambulatory SmartLinks:19316}  Review of Systems {ros - complete:30496}    Objective:   {Exam; back exam:5796::"Full range of motion without pain, no tenderness, no spasm, no curvature.","Normal reflexes, gait, strength and negative straight-leg raise."}    Assessment:    {back diagnosis:16452}    Plan:    {Plan; back pain:10213}

## 2017-04-18 NOTE — Progress Notes (Signed)
Breanna Sharp  MRN: [id number] DOB: Jul 16, 1951  Subjective:    Breanna Sharp is a 66 y.o. female who presents for evaluation of low back pain. The patient has had recurrent self limited episodes of low back pain in the past. Symptoms have been present for 3 days and are gradually improving.  Onset was related to / precipitated by lifting a heavy object. The pain is located in the across the lower back and does not radiate. The pain is described as aching and occurs intermittently.  Symptoms are improved by heat and stretching. She has also tried acetaminophen and NSAIDs which provided intermittent relief. Pain is worsened with bending and sitting for long periods. She denies weakness in the right leg, weakness in the left leg, tingling in the right leg, tingling in the left leg, burning pain in the right leg, burning pain in the left leg, urinary hesitancy, urinary incontinence, urinary retention, hematuria, bowel incontinence, constipation, impotence and groin/perineal numbness. The patient has no "red flag" history indicative of complicated back pain.[name] Has hx of stomach ulcers so when she takes NSAIDs she gets epigastric discomfort and nausea, which occurred today when she tried aleve.    Review of Systems  Eyes: Negative for visual disturbance.  Cardiovascular: Negative for chest pain, palpitations and leg swelling.  Gastrointestinal: Negative for abdominal pain, nausea and vomiting.  Neurological: Negative for dizziness, light-headedness and headaches.    Patient Active Problem List   Diagnosis Date Noted  . Dyslipidemia 05/11/2016  . Shortness of breath 05/11/2016  . Essential hypertension 05/11/2016  . Osteopenia 10/12/2015  . DOE (dyspnea on exertion) 05/02/2013  . Chronic sinusitis 05/02/2013  . Vertigo 08/14/2012  . Benign essential HTN 08/14/2012  . BMI 28.0-28.9,adult 08/14/2012  . COLONIC POLYPS 01/24/2008  . ACUT GASTR ULCER W/O MENTION HEMORR PERF/OBST 01/24/2008      Current Outpatient Prescriptions on File Prior to Visit  Medication Sig Dispense Refill  . calcium carbonate (TUMS EX) 750 MG chewable tablet Chew 1 tablet by mouth daily.    . cholecalciferol (VITAMIN D) 1000 UNITS tablet Take 2,000 Units by mouth daily.     [name] ezetimibe (ZETIA) 10 MG tablet Take 1 tablet (10 mg total) by mouth daily. 90 tablet 3  . fexofenadine (ALLEGRA) 180 MG tablet Take 1 tablet (180 mg total) by mouth daily. 90 tablet 3  . fluticasone (FLONASE) 50 MCG/ACT nasal spray Place 2 sprays into both nostrils daily. 48 g 6  . lisinopril-hydrochlorothiazide (PRINZIDE,ZESTORETIC) 10-12.5 MG tablet Take 1 tablet by mouth daily. 90 tablet 2  . Pitavastatin Calcium (LIVALO) 1 MG TABS Take 1 mg daily after dinner 90 tablet 3  . SPIRIVA RESPIMAT 2.5 MCG/ACT AERS USE 2 INHALATIONS DAILY 12 g 1  . Travoprost, BAK Free, (TRAVATAN) 0.004 % SOLN ophthalmic solution Place 1 drop into both eyes at bedtime.    . benzonatate (TESSALON) 100 MG capsule Take 1 capsule (100 mg total) by mouth 3 (three) times daily as needed for cough. (Patient not taking: Reported on 04/18/2017) 20 capsule 0   No current facility-administered medications on file prior to visit.     Allergies  Allergen Reactions  . Statins     Muscle cramps     Objective:  BP (!) 150/79 (BP Location: Right Arm, Patient Position: Sitting, Cuff Size: Large)   Pulse 80   Temp 97.9 F (36.6 C) (Oral)   Resp 16   Ht 5\' 4"  (1.626 m)   Wt 149 lb 12.8  oz (67.9 kg)   SpO2 97%   BMI 25.71 kg/m   Physical Exam  Constitutional: She is oriented to person, place, and time and well-developed, well-nourished, and in no distress.  HENT:  Head: Normocephalic and atraumatic.  Eyes: Conjunctivae are normal.  Neck: Normal range of motion.  Pulmonary/Chest: Effort normal.  Musculoskeletal:       Lumbar back: She exhibits tenderness (with palpation of musculature, most notable on right side) and spasm (most notable on right sided  musculature). She exhibits normal range of motion, no bony tenderness and no swelling.  Neurological: She is alert and oriented to person, place, and time. She has normal sensation and normal strength. Gait normal.  Reflex Scores:      Tricep reflexes are 2+ on the right side and 2+ on the left side.      Bicep reflexes are 2+ on the right side and 2+ on the left side.      Brachioradialis reflexes are 2+ on the right side and 2+ on the left side.      Patellar reflexes are 2+ on the right side and 2+ on the left side.      Achilles reflexes are 2+ on the right side and 2+ on the left side. Skin: Skin is warm and dry.  Psychiatric: Affect normal.  Vitals reviewed.  BP Readings from Last 3 Encounters:  04/18/17 (!) 150/79  03/08/17 116/71  12/27/16 120/62    Assessment and Plan :  1. Acute right-sided low back pain without sciatica No red flag findings. Likely due to strain and spasm. Will treat with OTC tylenol and Rx baclofen. Avoid NSAIDs due to hx of GI ulcers. Instructed to use heat to affected area 4-5 x a day for 20 minutes at a time. Given educational material on low back stretches. Instructed her to return to clinic if symptoms worsen, do not improve, or as needed - baclofen (LIORESAL) 10 MG tablet; Take 0.5 tablets (5 mg total) by mouth 3 (three) times daily.  Dispense: 30 each; Refill: 0  2. Elevated blood pressure reading Has hx of HTN, typically well controlled. Likely due to pain, she is otherwise asymptomatic. Instructed to check bp outside of office. Goal is <140/90, if consistently elevated, return for further evaluation. Given strict ED precautions both verbally and in AVS.   Tenna Delaine PA-C  [facility] 04/18/2017 6:19 PM

## 2017-04-18 NOTE — Patient Instructions (Addendum)
I recommend resting today. However, tomorrow I would begin walking and moving around as much as tolerated. Begin stretching in a couple of days. The worse thing you can do for low back pain is lie in bed all day or sit down all day. Use medications as needed.   Just to know, baclofen can cause side effects that may impair your thinking or reactions. Be careful if you drive or do anything that requires you to be awake and alert. void drinking alcohol, which can increase some of the side effects of this medication.  I recommend using tylenol 1000mg  every 6 hours as needed for pain.   You should avoid heavy lifting or strenuous repetitive activity to prevent recurrence of event. Use heat to affected area 4-5 x a day. Leave on for 15-20 minutes, 3-4 times a day.  Please perform exercises below. Stretches are to be performed for 2 sets, holding 10-15 seconds each. Recommended to perform this rehab twice daily within pain tolerance for 2 weeks.   FLEXION RANGE OF MOTION AND STRETCHING EXERCISES: STRETCH - Flexion, Single Knee to Chest   Lie on a firm bed or floor with both legs extended in front of you.  Keeping one leg in contact with the floor, bring your opposite knee to your chest. Hold your leg in place by either grabbing behind your thigh or at your knee.  Pull until you feel a gentle stretch in your lower back.   Slowly release your grasp and repeat the exercise with the opposite side.  STRETCH - Flexion, Double Knee to Chest   Lie on a firm bed or floor with both legs extended in front of you.  Keeping one leg in contact with the floor, bring your opposite knee to your chest.  Tense your stomach muscles to support your back and then lift your other knee to your chest. Hold your legs in place by either grabbing behind your thighs or at your knees.  Pull both knees toward your chest until you feel a gentle stretch in your lower back.   Tense your stomach muscles and slowly return one  leg at a time to the floor.  STRETCH - Low Trunk Rotation  Lie on a firm bed or floor. Keeping your legs in front of you, bend your knees so they are both pointed toward the ceiling and your feet are flat on the floor.  Extend your arms out to the side. This will stabilize your upper body by keeping your shoulders in contact with the floor.  Gently and slowly drop both knees together to one side until you feel a gentle stretch in your lower back.   Tense your stomach muscles to support your lower back as you bring your knees back to the starting position. Repeat the exercise to the other side.   EXTENSION RANGE OF MOTION AND FLEXIBILITY EXERCISES: STRETCH - Extension, Prone on Elbows   Lie on your stomach on the floor, a bed will be too soft. Place your palms about shoulder width apart and at the height of your head.  Place your elbows under your shoulders. If this is too painful, stack pillows under your chest.  Allow your body to relax so that your hips drop lower and make contact more completely with the floor.  Slowly return to lying flat on the floor.  RANGE OF MOTION - Extension, Prone Press Ups  Lie on your stomach on the floor, a bed will be too soft. Place your palms  about shoulder width apart and at the height of your head.  Keeping your back as relaxed as possible, slowly straighten your elbows while keeping your hips on the floor. You may adjust the placement of your hands to maximize your comfort. As you gain motion, your hands will come more underneath your shoulders.  Slowly return to lying flat on the floor.  RANGE OF MOTION- Quadruped, Neutral Spine   Assume a hands and knees position on a firm surface. Keep your hands under your shoulders and your knees under your hips. You may place padding under your knees for comfort.  Drop your head and point your tail bone toward the ground below you. This will round out your lower back like an angry cat.    Slowly lift  your head and release your tail bone so that your back sags into a large arch, like an old horse.  Repeat this until you feel limber in your lower back.  Now, find your "sweet spot." This will be the most comfortable position somewhere between the two previous positions. This is your neutral spine. Once you have found this position, tense your stomach muscles to support your lower back.  STRENGTHENING EXERCISES - Low Back Strain These exercises may help you when beginning to rehabilitate your injury. These exercises should be done near your "sweet spot." This is the neutral, low-back arch, somewhere between fully rounded and fully arched, that is your least painful position. When performed in this safe range of motion, these exercises can be used for people who have either a flexion or extension based injury. These exercises may resolve your symptoms with or without further involvement from your physician, physical therapist or athletic trainer. While completing these exercises, remember:   Muscles can gain both the endurance and the strength needed for everyday activities through controlled exercises.  Complete these exercises as instructed by your physician, physical therapist or athletic trainer. Increase the resistance and repetitions only as guided.  You may experience muscle soreness or fatigue, but the pain or discomfort you are trying to eliminate should never worsen during these exercises. If this pain does worsen, stop and make certain you are following the directions exactly. If the pain is still present after adjustments, discontinue the exercise until you can discuss the trouble with your caregiver.  STRENGTHENING - Deep Abdominals, Pelvic Tilt  Lie on a firm bed or floor. Keeping your legs in front of you, bend your knees so they are both pointed toward the ceiling and your feet are flat on the floor.  Tense your lower abdominal muscles to press your lower back into the floor. This  motion will rotate your pelvis so that your tail bone is scooping upwards rather than pointing at your feet or into the floor.  STRENGTHENING - Abdominals, Crunches   Lie on a firm bed or floor. Keeping your legs in front of you, bend your knees so they are both pointed toward the ceiling and your feet are flat on the floor. Cross your arms over your chest.  Slightly tip your chin down without bending your neck.  Tense your abdominals and slowly lift your trunk high enough to just clear your shoulder blades. Lifting higher can put excessive stress on the lower back and does not further strengthen your abdominal muscles.  Control your return to the starting position.  STRENGTHENING - Quadruped, Opposite UE/LE Lift   Assume a hands and knees position on a firm surface. Keep your hands under your  shoulders and your knees under your hips. You may place padding under your knees for comfort.  Find your neutral spine and gently tense your abdominal muscles so that you can maintain this position. Your shoulders and hips should form a rectangle that is parallel with the floor and is not twisted.  Keeping your trunk steady, lift your right hand no higher than your shoulder and then your left leg no higher than your hip. Make sure you are not holding your breath.   Continuing to keep your abdominal muscles tense and your back steady, slowly return to your starting position. Repeat with the opposite arm and leg.  STRENGTHENING - Lower Abdominals, Double Knee Lift  Lie on a firm bed or floor. Keeping your legs in front of you, bend your knees so they are both pointed toward the ceiling and your feet are flat on the floor.  Tense your abdominal muscles to brace your lower back and slowly lift both of your knees until they come over your hips. Be certain not to hold your breath.  POSTURE AND BODY MECHANICS CONSIDERATIONS - Low Back Strain Keeping correct posture when sitting, standing or completing  your activities will reduce the stress put on different body tissues, allowing injured tissues a chance to heal and limiting painful experiences. The following are general guidelines for improved posture. Your physician or physical therapist will provide you with any instructions specific to your needs. While reading these guidelines, remember:  The exercises prescribed by your provider will help you have the flexibility and strength to maintain correct postures.  The correct posture provides the best environment for your joints to work. All of your joints have less wear and tear when properly supported by a spine with good posture. This means you will experience a healthier, less painful body.  Correct posture must be practiced with all of your activities, especially prolonged sitting and standing. Correct posture is as important when doing repetitive low-stress activities (typing) as it is when doing a single heavy-load activity (lifting). RESTING POSITIONS Consider which positions are most painful for you when choosing a resting position. If you have pain with flexion-based activities (sitting, bending, stooping, squatting), choose a position that allows you to rest in a less flexed posture. You would want to avoid curling into a fetal position on your side. If your pain worsens with extension-based activities (prolonged standing, working overhead), avoid resting in an extended position such as sleeping on your stomach. Most people will find more comfort when they rest with their spine in a more neutral position, neither too rounded nor too arched. Lying on a non-sagging bed on your side with a pillow between your knees, or on your back with a pillow under your knees will often provide some relief. Keep in mind, being in any one position for a prolonged period of time, no matter how correct your posture, can still lead to stiffness. PROPER SITTING POSTURE In order to minimize stress and discomfort on  your spine, you must sit with correct posture. Sitting with good posture should be effortless for a healthy body. Returning to good posture is a gradual process. Many people can work toward this most comfortably by using various supports until they have the flexibility and strength to maintain this posture on their own. When sitting with proper posture, your ears will fall over your shoulders and your shoulders will fall over your hips. You should use the back of the chair to support your upper back. Your lower  back will be in a neutral position, just slightly arched. You may place a small pillow or folded towel at the base of your lower back for support.  When working at a desk, create an environment that supports good, upright posture. Without extra support, muscles tire, which leads to excessive strain on joints and other tissues. Keep these recommendations in mind: CHAIR:  A chair should be able to slide under your desk when your back makes contact with the back of the chair. This allows you to work closely.  The chair's height should allow your eyes to be level with the upper part of your monitor and your hands to be slightly lower than your elbows. BODY POSITION  Your feet should make contact with the floor. If this is not possible, use a foot rest.  Keep your ears over your shoulders. This will reduce stress on your neck and lower back. INCORRECT SITTING POSTURES  If you are feeling tired and unable to assume a healthy sitting posture, do not slouch or slump. This puts excessive strain on your back tissues, causing more damage and pain. Healthier options include:  Using more support, like a lumbar pillow.  Switching tasks to something that requires you to be upright or walking.  Talking a brief walk.  Lying down to rest in a neutral-spine position. PROLONGED STANDING WHILE SLIGHTLY LEANING FORWARD  When completing a task that requires you to lean forward while standing in one place for  a long time, place either foot up on a stationary 2-4 inch high object to help maintain the best posture. When both feet are on the ground, the lower back tends to lose its slight inward curve. If this curve flattens (or becomes too large), then the back and your other joints will experience too much stress, tire more quickly, and can cause pain. CORRECT STANDING POSTURES Proper standing posture should be assumed with all daily activities, even if they only take a few moments, like when brushing your teeth. As in sitting, your ears should fall over your shoulders and your shoulders should fall over your hips. You should keep a slight tension in your abdominal muscles to brace your spine. Your tailbone should point down to the ground, not behind your body, resulting in an over-extended swayback posture.  INCORRECT STANDING POSTURES  Common incorrect standing postures include a forward head, locked knees and/or an excessive swayback. WALKING Walk with an upright posture. Your ears, shoulders and hips should all line-up. PROLONGED ACTIVITY IN A FLEXED POSITION When completing a task that requires you to bend forward at your waist or lean over a low surface, try to find a way to stabilize 3 out of 4 of your limbs. You can place a hand or elbow on your thigh or rest a knee on the surface you are reaching across. This will provide you more stability so that your muscles do not fatigue as quickly. By keeping your knees relaxed, or slightly bent, you will also reduce stress across your lower back. CORRECT LIFTING TECHNIQUES DO :   Assume a wide stance. This will provide you more stability and the opportunity to get as close as possible to the object which you are lifting.  Tense your abdominals to brace your spine. Bend at the knees and hips. Keeping your back locked in a neutral-spine position, lift using your leg muscles. Lift with your legs, keeping your back straight.  Test the weight of unknown objects  before attempting to lift them.  Try to keep your elbows locked down at your sides in order get the best strength from your shoulders when carrying an object.  Always ask for help when lifting heavy or awkward objects. INCORRECT LIFTING TECHNIQUES DO NOT:   Lock your knees when lifting, even if it is a small object.  Bend and twist. Pivot at your feet or move your feet when needing to change directions.  Assume that you can safely pick up even a paper clip without proper posture.     In terms of elevated blood pressure, I would like you to check your blood pressure at least a couple times over the next week outside of the office and document these values. It is best if you check the blood pressure at different times in the day. Your goal is <140/90. If your values are consistently above this goal, please return to office for further evaluation. If you start to have chest pain, blurred vision, shortness of breath, severe headache, lower leg swelling, or nausea/vomiting please seek care immediately here or at the ED.    IF you received an x-ray today, you will receive an invoice from [facility]. Please contact [facility] at [phone] with questions or concerns regarding your invoice.   IF you received labwork today, you will receive an invoice from [place]. Please contact [facility] at [phone] with questions or concerns regarding your invoice.   Our billing staff will not be able to assist you with questions regarding bills from these companies.  You will be contacted with the lab results as soon as they are available. The fastest way to get your results is to activate your My Chart account. Instructions are located on the last page of this paperwork. If you have not heard from [place] regarding the results in 2 weeks, please contact this office.

## 2017-04-18 NOTE — Telephone Encounter (Signed)
Called patient to express my apologies for checking her heart and lungs in the lobby as she was leaving. She stated that it did not bother her at all and she was glad I did so. She thanked me for my care. I encouraged to call me back if her bp remains elevated >140/90 over the next few days. She stated she will do so.

## 2017-04-19 ENCOUNTER — Ambulatory Visit: Payer: Medicare Other | Admitting: Family Medicine

## 2017-05-03 ENCOUNTER — Other Ambulatory Visit: Payer: Self-pay | Admitting: Family Medicine

## 2017-05-03 DIAGNOSIS — I10 Essential (primary) hypertension: Secondary | ICD-10-CM

## 2017-05-11 ENCOUNTER — Ambulatory Visit (INDEPENDENT_AMBULATORY_CARE_PROVIDER_SITE_OTHER): Payer: Medicare Other | Admitting: Physician Assistant

## 2017-05-11 ENCOUNTER — Encounter: Payer: Self-pay | Admitting: Physician Assistant

## 2017-05-11 VITALS — BP 166/83 | HR 83 | Temp 98.1°F | Resp 18 | Ht 63.9 in | Wt 148.6 lb

## 2017-05-11 DIAGNOSIS — I10 Essential (primary) hypertension: Secondary | ICD-10-CM | POA: Diagnosis not present

## 2017-05-11 LAB — POCT URINALYSIS DIP (MANUAL ENTRY)
Bilirubin, UA: NEGATIVE
Glucose, UA: NEGATIVE mg/dL
Ketones, POC UA: NEGATIVE mg/dL
Leukocytes, UA: NEGATIVE
Nitrite, UA: NEGATIVE
Protein Ur, POC: NEGATIVE mg/dL
Spec Grav, UA: 1.01 (ref 1.010–1.025)
Urobilinogen, UA: 0.2 E.U./dL
pH, UA: 5 (ref 5.0–8.0)

## 2017-05-11 LAB — POC MICROSCOPIC URINALYSIS (UMFC): Mucus: ABSENT

## 2017-05-11 NOTE — Progress Notes (Signed)
MRN: [id number] DOB: 09-Jul-1951  Subjective:   Breanna Sharp is a 66 y.o. female presenting for follow up on Hypertension. She was evaluated by me for back pain on 04/18/17 and had a high blood pressure reading in office. She is typically well controlled but was in a lot of pain. She has monitored it daily since then at home and notes after that day it went back to her typical values of 950-932 systolic. However today she checked it and was elevated at 136 and she kept checking it every hour and it has kept going up. She had readings in the 150s and 160s so she decided to come in. She is very stressed about it.  Takes allegra every day but is not sure if it has DM in it. She has eaten a high salt diet over the past 2 days which is not typical for her.  Denies lightheadedness, dizziness, chronic headache, double vision, chest pain, shortness of breath, heart racing, palpitations, nausea, vomiting, abdominal pain, hematuria, lower leg swelling. Denies smoking. She is currently managed with lisinopril-HCTZ 10-12.'5mg'$   Breanna Sharp has a current medication list which includes the following prescription(s): calcium carbonate, cholecalciferol, ezetimibe, fexofenadine, fluticasone, lisinopril-hydrochlorothiazide, pitavastatin calcium, spiriva respimat, travoprost (bak free), baclofen, and benzonatate. Also is allergic to statins.  Breanna Sharp  has a past medical history of Arthritis; Dyslipidemia; Hypertension; and Vertigo. Also  has a past surgical history that includes Abdominal hysterectomy (1972).   Objective:   Vitals: BP (!) 166/83 (BP Location: Right Arm, Patient Position: Sitting, Cuff Size: Normal)   Pulse 83   Temp 98.1 F (36.7 C) (Oral)   Resp 18   Ht 5' 3.9" (1.623 m)   Wt 148 lb 9.6 oz (67.4 kg)   SpO2 99%   BMI 25.59 kg/m   Physical Exam  Constitutional: She is oriented to person, place, and time. She appears well-developed and well-nourished.  HENT:  Head: Normocephalic and atraumatic.    Eyes: Conjunctivae are normal.  Neck: Normal range of motion.  Cardiovascular: Normal rate, regular rhythm, normal heart sounds and intact distal pulses.   Pulmonary/Chest: Effort normal and breath sounds normal. She has no wheezes. She has no rales.  Musculoskeletal:       Right lower leg: She exhibits no swelling.       Left lower leg: She exhibits no swelling.  Neurological: She is alert and oriented to person, place, and time.  Skin: Skin is warm and dry.  Psychiatric: Her mood appears anxious.  Vitals reviewed.   Results for orders placed or performed in visit on 05/11/17 (from the past 24 hour(s))  POCT urinalysis dipstick     Status: Abnormal   Collection Time: 05/11/17  5:30 PM  Result Value Ref Range   Color, UA yellow yellow   Clarity, UA clear clear   Glucose, UA negative negative mg/dL   Bilirubin, UA negative negative   Ketones, POC UA negative negative mg/dL   Spec Grav, UA 1.010 1.010 - 1.025   Blood, UA trace-intact (A) negative   pH, UA 5.0 5.0 - 8.0   Protein Ur, POC negative negative mg/dL   Urobilinogen, UA 0.2 0.2 or 1.0 E.U./dL   Nitrite, UA Negative Negative   Leukocytes, UA Negative Negative  POCT Microscopic Urinalysis ([facility])     Status: Abnormal   Collection Time: 05/11/17  6:05 PM  Result Value Ref Range   WBC,UR,HPF,POC None None WBC/hpf   RBC,UR,HPF,POC None None RBC/hpf   Bacteria None None,  Too numerous to count   Mucus Absent Absent   Epithelial Cells, UR Per Microscopy Few (A) None, Too numerous to count cells/hpf   BP Readings from Last 3 Encounters:  05/11/17 (!) 166/83  04/18/17 (!) 150/79  03/08/17 116/71    Assessment and Plan :  1. Essential hypertension Uncontrolled in office today. Pt is asymptomatic. I reviewed her home bp recordings as she brought those with her to the visit. It is reassuring that she has had well controlled readings over the past two weeks, despite the one elevated reading she got today. Do not suggest  increasing bp medication at this time. It has likely continued to increase throughout the day as she has checked it very often and has been anxious about the readings. Labs pending. Instructed to make sure her allegra is not allegra-d and decrease the sodium content in her diet. Check bp a few times over the week and document these values. Return in one week. Bring home bp cuff to visit for calibration. Given strict ED/retrun precautions.  - CMP14+EGFR - TSH - POCT urinalysis dipstick - POCT Microscopic Urinalysis ([facility])  Tenna Delaine, PA-C  [facility] 05/13/2017 9:26 AM

## 2017-05-11 NOTE — Patient Instructions (Addendum)
In terms of blood pressure, I would like you to decrease your salt consumption. Also, check your allergy medication to make sure it is not allegra-d. Check your bp a few times over the next week. Your goal is <140/90. I would like you to return in one week and bring your blood pressure cuff to this visit so we can calibrate it. If you start to have chest pain, blurred vision, shortness of breath, severe headache, lower leg swelling, or nausea/vomiting please seek care immediately here or at the ED.     IF you received an x-ray today, you will receive an invoice from [facility]. Please contact [facility] at [phone] with questions or concerns regarding your invoice.   IF you received labwork today, you will receive an invoice from [place]. Please contact [facility] at [phone] with questions or concerns regarding your invoice.   Our billing staff will not be able to assist you with questions regarding bills from these companies.  You will be contacted with the lab results as soon as they are available. The fastest way to get your results is to activate your My Chart account. Instructions are located on the last page of this paperwork. If you have not heard from [place] regarding the results in 2 weeks, please contact this office.

## 2017-05-12 LAB — CMP14+EGFR
ALT: 15 IU/L (ref 0–32)
AST: 15 IU/L (ref 0–40)
Albumin/Globulin Ratio: 2 (ref 1.2–2.2)
Albumin: 4.5 g/dL (ref 3.6–4.8)
Alkaline Phosphatase: 64 IU/L (ref 39–117)
BUN/Creatinine Ratio: 21 (ref 12–28)
BUN: 16 mg/dL (ref 8–27)
Bilirubin Total: 0.4 mg/dL (ref 0.0–1.2)
CO2: 23 mmol/L (ref 20–29)
Calcium: 10 mg/dL (ref 8.7–10.3)
Chloride: 105 mmol/L (ref 96–106)
Creatinine, Ser: 0.77 mg/dL (ref 0.57–1.00)
GFR calc Af Amer: 92 mL/min/{1.73_m2} (ref >59)
GFR calc non Af Amer: 80 mL/min/{1.73_m2} (ref >59)
Globulin, Total: 2.3 g/dL (ref 1.5–4.5)
Glucose: 112 mg/dL — ABNORMAL HIGH (ref 65–99)
Potassium: 3.9 mmol/L (ref 3.5–5.2)
Sodium: 143 mmol/L (ref 134–144)
Total Protein: 6.8 g/dL (ref 6.0–8.5)

## 2017-05-12 LAB — TSH: TSH: 1.56 u[IU]/mL (ref 0.450–4.500)

## 2017-05-16 ENCOUNTER — Encounter: Payer: Self-pay | Admitting: Physician Assistant

## 2017-05-16 ENCOUNTER — Ambulatory Visit (INDEPENDENT_AMBULATORY_CARE_PROVIDER_SITE_OTHER): Payer: Medicare Other | Admitting: Physician Assistant

## 2017-05-16 VITALS — BP 135/79 | HR 92 | Temp 98.1°F | Resp 18 | Ht 63.9 in | Wt 146.8 lb

## 2017-05-16 DIAGNOSIS — H6123 Impacted cerumen, bilateral: Secondary | ICD-10-CM

## 2017-05-16 DIAGNOSIS — I10 Essential (primary) hypertension: Secondary | ICD-10-CM | POA: Diagnosis not present

## 2017-05-16 NOTE — Patient Instructions (Addendum)
It is reassuring that your home bp monitor is consistent with our in house readings. I do believe that one day of elevated bp readings was a mix of high salt intake and anxiety. It is very reassuring that your values have been normal since you left. I recommend only checking your bp every 4-5 days at this point. Follow up with your PCP as planned for HTN.  If you ever start to have chest pain, blurred vision, shortness of breath, severe headache, lower leg swelling, or nausea/vomiting please seek care immediately here or at the ED.   In terms of ear wax, make sure you avoid using any qtips. I recommend using a warm wash cloth and wiping the ears regularly. Please follow up if symptoms worsen. Thank you for letting me participate in your health and well being.   Earwax Buildup, Adult The ears produce a substance called earwax that helps keep bacteria out of the ear and protects the skin in the ear canal. Occasionally, earwax can build up in the ear and cause discomfort or hearing loss. What increases the risk? This condition is more likely to develop in people who:  Are female.  Are elderly.  Naturally produce more earwax.  Clean their ears often with cotton swabs.  Use earplugs often.  Use in-ear headphones often.  Wear hearing aids.  Have narrow ear canals.  Have earwax that is overly thick or sticky.  Have eczema.  Are dehydrated.  Have excess hair in the ear canal.  What are the signs or symptoms? Symptoms of this condition include:  Reduced or muffled hearing.  A feeling of fullness in the ear or feeling that the ear is plugged.  Fluid coming from the ear.  Ear pain.  Ear itch.  Ringing in the ear.  Coughing.  An obvious piece of earwax that can be seen inside the ear canal.  How is this diagnosed? This condition may be diagnosed based on:  Your symptoms.  Your medical history.  An ear exam. During the exam, your health care provider will look into your  ear with an instrument called an otoscope.  You may have tests, including a hearing test. How is this treated? This condition may be treated by:  Using ear drops to soften the earwax.  Having the earwax removed by a health care provider. The health care provider may: ? Flush the ear with water. ? Use an instrument that has a loop on the end (curette). ? Use a suction device.  Surgery to remove the wax buildup. This may be done in severe cases.  Follow these instructions at home:  Take over-the-counter and prescription medicines only as told by your health care provider.  Do not put any objects, including cotton swabs, into your ear. You can clean the opening of your ear canal with a washcloth or facial tissue.  Follow instructions from your health care provider about cleaning your ears. Do not over-clean your ears.  Drink enough fluid to keep your urine clear or pale yellow. This will help to thin the earwax.  Keep all follow-up visits as told by your health care provider. If earwax builds up in your ears often or if you use hearing aids, consider seeing your health care provider for routine, preventive ear cleanings. Ask your health care provider how often you should schedule your cleanings.  If you have hearing aids, clean them according to instructions from the manufacturer and your health care provider. Contact a health care provider  if:  You have ear pain.  You develop a fever.  You have blood, pus, or other fluid coming from your ear.  You have hearing loss.  You have ringing in your ears that does not go away.  Your symptoms do not improve with treatment.  You feel like the room is spinning (vertigo). Summary  Earwax can build up in the ear and cause discomfort or hearing loss.  The most common symptoms of this condition include reduced or muffled hearing and a feeling of fullness in the ear or feeling that the ear is plugged.  This condition may be diagnosed  based on your symptoms, your medical history, and an ear exam.  This condition may be treated by using ear drops to soften the earwax or by having the earwax removed by a health care provider.  Do not put any objects, including cotton swabs, into your ear. You can clean the opening of your ear canal with a washcloth or facial tissue. This information is not intended to replace advice given to you by your health care provider. Make sure you discuss any questions you have with your health care provider. Document Released: 12/21/2004 Document Revised: 01/24/2017 Document Reviewed: 01/24/2017 Elsevier Interactive Patient Education  2018 [employer].    IF you received an x-ray today, you will receive an invoice from [facility]. Please contact [facility] at [phone] with questions or concerns regarding your invoice.   IF you received labwork today, you will receive an invoice from [place]. Please contact [facility] at [phone] with questions or concerns regarding your invoice.   Our billing staff will not be able to assist you with questions regarding bills from these companies.  You will be contacted with the lab results as soon as they are available. The fastest way to get your results is to activate your My Chart account. Instructions are located on the last page of this paperwork. If you have not heard from [place] regarding the results in 2 weeks, please contact this office.

## 2017-05-16 NOTE — Progress Notes (Signed)
Breanna Sharp  MRN: [id number] DOB: 08-20-1951  Subjective:  Breanna Sharp is a 66 y.o. female seen in office today for a chief complaint of follow up on elevated blood pressure reading. Pt seen by me on 05/11/17 with concern of one day of elevated bp readings after consuming a high salt diet for a few days. She was aympotomatic at the time but rather anxious about her readings. In office, her reading was 166/83. She was instreucted to go home, rest, and avoid taking bp every hour. Check a few times over the week and return to clinic for reevluatoin with home monitor to calibrate. Today, pt reports she has been doing very well. She has checked her bp every couple days since her last OV.  Readings have been very well controlled, ranges are 956-213 systolic and 08-65 diastolic. She has decreased the salt in her diet. Denies lightheadedness, dizziness, chronic headache, double vision, chest pain, shortness of breath, heart racing, palpitations, nausea, vomiting, abdominal pain, hematuria, lower leg swelling. Denies smoking. She is currently managed with lisinopril-HCTZ 10-12.5mg   Review of Systems  Constitutional: Negative for chills, diaphoresis and fever.  HENT: Negative for ear discharge, ear pain, hearing loss and tinnitus.        Positive for ear fullness (notes she has been able to "hear her heartbeat" in her ears over the past few weeks, has hx of cerumen impaction, notes she has difficulty ear canals to clean out)    Eyes: Negative for visual disturbance.    Patient Active Problem List   Diagnosis Date Noted  . Dyslipidemia 05/11/2016  . Shortness of breath 05/11/2016  . Essential hypertension 05/11/2016  . Osteopenia 10/12/2015  . DOE (dyspnea on exertion) 05/02/2013  . Chronic sinusitis 05/02/2013  . Vertigo 08/14/2012  . Benign essential HTN 08/14/2012  . BMI 28.0-28.9,adult 08/14/2012  . COLONIC POLYPS 01/24/2008  . ACUT GASTR ULCER W/O MENTION HEMORR PERF/OBST  01/24/2008    Current Outpatient Prescriptions on File Prior to Visit  Medication Sig Dispense Refill  . calcium carbonate (TUMS EX) 750 MG chewable tablet Chew 1 tablet by mouth daily.    . cholecalciferol (VITAMIN D) 1000 UNITS tablet Take 2,000 Units by mouth daily.     [name] ezetimibe (ZETIA) 10 MG tablet Take 1 tablet (10 mg total) by mouth daily. 90 tablet 3  . fexofenadine (ALLEGRA) 180 MG tablet Take 1 tablet (180 mg total) by mouth daily. 90 tablet 3  . fluticasone (FLONASE) 50 MCG/ACT nasal spray Place 2 sprays into both nostrils daily. 48 g 6  . lisinopril-hydrochlorothiazide (PRINZIDE,ZESTORETIC) 10-12.5 MG tablet Take 1 tablet by mouth daily. Office visit needed for additional refills 90 tablet 0  . Pitavastatin Calcium (LIVALO) 1 MG TABS Take 1 mg daily after dinner 90 tablet 3  . SPIRIVA RESPIMAT 2.5 MCG/ACT AERS USE 2 INHALATIONS DAILY 12 g 1  . Travoprost, BAK Free, (TRAVATAN) 0.004 % SOLN ophthalmic solution Place 1 drop into both eyes at bedtime.     No current facility-administered medications on file prior to visit.     Allergies  Allergen Reactions  . Statins     Muscle cramps       Social History   Social History  . Marital status: Married    Spouse name: [name]  . Number of children: 0  . Years of education: 12   Occupational History  .  Retired    Retired   Social History Main Topics  . Smoking status: Former  Smoker    Packs/day: 0.50    Years: 32.00    Types: Cigarettes    Quit date: 11/27/2002  . Smokeless tobacco: Never Used  . Alcohol use 1.8 oz/week    3 Standard drinks or equivalent per week     Comment: social - 2 or 3 times a month 1-2 glasses of wine  . Drug use: No  . Sexual activity: Yes    Partners: Male   Other Topics Concern  . Not on file   Social History Narrative   Exercise 3 to 4 times/week walking for 45 min -1 hour   Patient lives at home with her husband Breanna Sharp).   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   Right handed.   decaffeine Green Tea.    Objective:  BP 135/79 (BP Location: Right Arm, Patient Position: Sitting, Cuff Size: Normal)   Pulse 92   Resp 18   Ht 5' 3.9" (1.623 m)   Wt 146 lb 12.8 oz (66.6 kg)   SpO2 98%   BMI 25.28 kg/m   Physical Exam  Constitutional: She is oriented to person, place, and time and well-developed, well-nourished, and in no distress.  HENT:  Head: Normocephalic and atraumatic.  Right Ear: There is drainage (dark brown cerumen blocking view of TM).  Left Ear: There is drainage (moderate amount of dark brown cerumen in ear canal, anterior aspect of TM visible, appears normal).  Eyes: Conjunctivae are normal.  Neck: Normal range of motion.  Cardiovascular: Normal rate, regular rhythm and normal heart sounds.   Pulmonary/Chest: Effort normal and breath sounds normal.  Neurological: She is alert and oriented to person, place, and time. Gait normal.  Skin: Skin is warm and dry.  Psychiatric: Affect normal.  Vitals reviewed.   BP Readings from Last 3 Encounters:  05/16/17 135/79  05/11/17 (!) 166/83  04/18/17 (!) 150/79    Home BP monitor calibrated. BP readings consistent with our in office readings.   Post ear wax removal, TM visible bilaterally, appear normal, no erythema, effusion, or bulging noted.  Assessment and Plan :   1. Essential hypertension Well controlled in office today. Her home bp monitor was calibrated against our monitor in house today in office, bp readings were consistent, which is great. Her most recent home readings have been well controlled. Pt reassured that this was likely due to a mix of high salt intake and anxiety. Instructed to only monitor bp every 4-5 days. Continue current dose of bp medication (lisinopril-hctz 10-12.5mg  daily). Seek care immediately if she develops any chest pain, blurred vision, shortness of breath, severe headache,  lower leg swelling, or nausea/vomiting. Follow up for HTN as planned with PCP.  2. Bilateral impacted cerumen Resolved.  - Ear wax removal  A total of 25 was spent in the room with the patient, greater than 50% of which was in counseling/coordination of care regarding essential hypertension.  Tenna Delaine, PA-C  [facility] 05/16/2017 10:46 AM

## 2017-06-06 ENCOUNTER — Encounter: Payer: Self-pay | Admitting: Internal Medicine

## 2017-06-06 ENCOUNTER — Ambulatory Visit (INDEPENDENT_AMBULATORY_CARE_PROVIDER_SITE_OTHER): Payer: Medicare Other | Admitting: Internal Medicine

## 2017-06-06 VITALS — BP 134/72 | HR 85 | Ht 64.0 in | Wt 146.8 lb

## 2017-06-06 DIAGNOSIS — J449 Chronic obstructive pulmonary disease, unspecified: Secondary | ICD-10-CM | POA: Diagnosis not present

## 2017-06-06 DIAGNOSIS — I10 Essential (primary) hypertension: Secondary | ICD-10-CM | POA: Diagnosis not present

## 2017-06-06 DIAGNOSIS — E785 Hyperlipidemia, unspecified: Secondary | ICD-10-CM | POA: Diagnosis not present

## 2017-06-06 NOTE — Patient Instructions (Signed)
Your physician recommends that you return for lab work FASTING to check cholesterol  -- there is a lab in our office, no appointment is needed  Your physician wants you to follow-up in: ONE YEAR with Dr. Debara Pickett. You will receive a reminder letter in the mail two months in advance. If you don't receive a letter, please call our office to schedule the follow-up appointment.

## 2017-06-06 NOTE — Progress Notes (Signed)
Grossly normal  OFFICE NOTE  Chief Complaint:  Annual follow-up  Primary Care Physician: Wendie Agreste, MD  HPI:  Breanna Sharp is a pleasant 66 year old female with a history of hypertension and dyslipidemia. Unfortunately she's been intolerant to statins in the past and has failed both Lipitor Zocor Crestor and pravastatin. She is reported some increasing shortness of breath mostly when walking upstairs, however has been struggling with recurrent sinusitis and upper respiratory symptoms. Based on her risk factors, recommended metabolic testing. She underwent cardiopulmonary testing on 04/22/2013. She had maximal effort of 1.08 our ER. Peak VO2 was 108% predicted. Heart rate was 94% predicted. Her heart rate in view to curves were essentially normal with late flattening of her view to curve. Overall the study is low risk. She also went underwent palmar he function testing which showed normal diffusion, volume and flow loops. At her last visit she started taking Livalo samples due to an abnormal lipid profile. Her total cholesterol was 213, triglycerides 122, HDL 55 and LDL 134. Over the intial month on Livalo, she seemed to tolerate it.   Then she reported some right flank pain, especially when lifting weights, which he had started to do at the same time. She stopped her cholesterol medicine and stopped her exercise and her symptoms improved. She then restarted her exercise and had symptoms and realized that it was not likely the cholesterol medicine that was causing her right side pain. Her repeat cholesterol profile was abnormally high on only Zetia monotherapy, as she discontinued Livalo due to cramps. Her LDL particle number was 3189, LDL content was 190, HDL C. was 60 and triglycerides 148.    Breanna Sharp returns today for followup of her lipid profile. Her lipid profile in April was markedly improved with an LDL particle #1594, LDL content 105, HDL 51 and triglycerides 142. She reports  some improvement in her shortness of breath and has managed to work on exercise and has had 12 pounds of weight loss.  I saw Breanna Sharp back in the office today. Overall she is doing well except she still has some shortness of breath despite weight loss, particularly when walking up inclines. We will not been able to really establish the reason for this. Heart rate did go up to a very high level with exercise, 94% predicted on cardiopulmonary exercise testing in 2014. Is not clear whether this is an arrhythmia or just sinus tachycardia. This could explain why she feels worse when walking up hills. I like to see if we can re-create this with exercise treadmill stress testing. If there is a sharp increase in heart rate with exercise, she may benefit from a low-dose beta blocker.  05/11/2016  Breanna Sharp returns today for follow-up. She is without any significant complaints. Blood pressure was mildly elevated at 133/87 however repeat was 128/78. She is not having any problems on that area and Livalo. She is due for repeat cholesterol test. After an extensive workup we cannot find a cause from a cardiac standpoint of her shortness of breath. She was referred to [facility] and saw Dr. Vaughan Browner who performed palmar function test and feels that she might have some COPD. She's been on Spiriva and reports improvement in her shortness of breath.  06/06/2017  Breanna Sharp was seen today in follow-up. She is overall without complaints. Denies any shortness of breath or chest pain. She reports her COPD is improved significantly. She's managed to lose some weight and exercising regularly. Blood  pressure is reasonable today 134/72.  PMHx:  Past Medical History:  Diagnosis Date  . Arthritis   . Dyslipidemia    statin intolerance  . Hypertension   . Vertigo     Past Surgical History:  Procedure Laterality Date  . ABDOMINAL HYSTERECTOMY  1972   cervical cancer    FAMHx:  Family History    Problem Relation Age of Onset  . Hypertension Mother        dx in her 57's  . Heart attack Mother 63  . Heart attack Brother 54  . Diabetes Brother   . Hyperlipidemia Brother   . Hypertension Brother   . Heart disease Brother     SOCHx:   reports that she quit smoking about 14 years ago. Her smoking use included Cigarettes. She has a 16.00 pack-year smoking history. She has never used smokeless tobacco. She reports that she drinks about 1.8 oz of alcohol per week . She reports that she does not use drugs.  ALLERGIES:  Allergies  Allergen Reactions  . Statins     Muscle cramps    ROS: Pertinent items noted in HPI and remainder of comprehensive ROS otherwise negative.  HOME MEDS: Current Outpatient Prescriptions  Medication Sig Dispense Refill  . calcium carbonate (TUMS EX) 750 MG chewable tablet Chew 1 tablet by mouth daily.    . cholecalciferol (VITAMIN D) 1000 UNITS tablet Take 2,000 Units by mouth daily.     [name] ezetimibe (ZETIA) 10 MG tablet Take 1 tablet (10 mg total) by mouth daily. 90 tablet 3  . fexofenadine (ALLEGRA) 180 MG tablet Take 1 tablet (180 mg total) by mouth daily. 90 tablet 3  . fluticasone (FLONASE) 50 MCG/ACT nasal spray Place 2 sprays into both nostrils daily. 48 g 6  . lisinopril-hydrochlorothiazide (PRINZIDE,ZESTORETIC) 10-12.5 MG tablet Take 1 tablet by mouth daily. Office visit needed for additional refills 90 tablet 0  . Pitavastatin Calcium (LIVALO) 1 MG TABS Take 1 mg daily after dinner 90 tablet 3  . SPIRIVA RESPIMAT 2.5 MCG/ACT AERS USE 2 INHALATIONS DAILY 12 g 1  . Travoprost, BAK Free, (TRAVATAN) 0.004 % SOLN ophthalmic solution Place 1 drop into both eyes at bedtime.     No current facility-administered medications for this visit.     LABS/IMAGING: No results found for this or any previous visit (from the past 48 hour(s)). No results found.  VITALS: BP 134/72   Pulse 85   Ht 5\' 4"  (1.626 m)   Wt 146 lb 12.8 oz (66.6 kg)   BMI 25.20  kg/m    EXAM: General appearance: alert and no distress Neck: no carotid bruit and no JVD Lungs: clear to auscultation bilaterally Heart: regular rate and rhythm, S1, S2 normal, no murmur, click, rub or gallop Abdomen: soft, non-tender; bowel sounds normal; no masses,  no organomegaly Extremities: extremities normal, atraumatic, no cyanosis or edema Pulses: 2+ and symmetric Skin: Skin color, texture, turgor normal. No rashes or lesions Neurologic: GroPsych: Pleasanty normal Psych: Pleasant  EKG: Normal sinus rhythm 85, nonspecific T wave changes - personally reviewed  ASSESSMENT: 1. Dyslipidemia 2.   Hypertension 3.   COPD  PLAN: 87.   Mrs. Gama has not had any further dyspnea after treatment for COPD. In general she is doing well and stable exercise fairly regularly. Weight is appropriate, blood pressure is well controlled and she remains on low-dose pitavastatin without side effects. We'll plan a repeat fasting lipid profile. Adjustments to medications as necessary. Follow-up with me  annually or sooner as necessary.  Pixie Casino, MD, [facility] Attending Cardiologist [place] [facility] Daniyla Pfahler 06/06/2017, 5:24 PM

## 2017-06-07 DIAGNOSIS — E785 Hyperlipidemia, unspecified: Secondary | ICD-10-CM | POA: Diagnosis not present

## 2017-06-07 LAB — LIPID PANEL
Chol/HDL Ratio: 3.2 ratio (ref 0.0–4.4)
Cholesterol, Total: 188 mg/dL (ref 100–199)
HDL: 58 mg/dL (ref >39)
LDL Calculated: 106 mg/dL — ABNORMAL HIGH (ref 0–99)
Triglycerides: 119 mg/dL (ref 0–149)
VLDL Cholesterol Cal: 24 mg/dL (ref 5–40)

## 2017-06-12 DIAGNOSIS — H25013 Cortical age-related cataract, bilateral: Secondary | ICD-10-CM | POA: Diagnosis not present

## 2017-06-12 DIAGNOSIS — H25043 Posterior subcapsular polar age-related cataract, bilateral: Secondary | ICD-10-CM | POA: Diagnosis not present

## 2017-06-12 DIAGNOSIS — H401131 Primary open-angle glaucoma, bilateral, mild stage: Secondary | ICD-10-CM | POA: Diagnosis not present

## 2017-06-12 DIAGNOSIS — H2513 Age-related nuclear cataract, bilateral: Secondary | ICD-10-CM | POA: Diagnosis not present

## 2017-06-13 ENCOUNTER — Encounter: Payer: Self-pay | Admitting: Internal Medicine

## 2017-07-10 ENCOUNTER — Ambulatory Visit (INDEPENDENT_AMBULATORY_CARE_PROVIDER_SITE_OTHER): Payer: Medicare Other | Admitting: Pulmonary Disease

## 2017-07-10 ENCOUNTER — Encounter: Payer: Self-pay | Admitting: Pulmonary Disease

## 2017-07-10 VITALS — BP 126/64 | HR 83 | Ht 64.0 in | Wt 148.8 lb

## 2017-07-10 DIAGNOSIS — J449 Chronic obstructive pulmonary disease, unspecified: Secondary | ICD-10-CM | POA: Diagnosis not present

## 2017-07-10 NOTE — Progress Notes (Signed)
Breanna Sharp    [id number]    06/02/1951  Primary Care Physician:Greene, Ranell Patrick, MD  Referring Physician: Wendie Agreste, MD [address] [place], [place] [address]  Chief complaint:   Follow up for COPD GOLD A  HPI: Breanna Sharp is a 66 year old with past medical history of hypertension. She is here for follow up of dyspnea on exertion for the past 2 years. His symptoms mostly occur when she is walking up an incline or climbing stairs. She usually gets winded after 1 flight of stairs. She does not have any associated wheeze, cough, sputum production. The symptoms do not occur at rest. She was evaluated with a cardiopulmonary exercise test on 04/22/13. The results showed good exercise capacity. The peak VO2 of 108% of predicted. There are no obvious pulmonary abnormalities. She had a good breathing reserve.   Interim History: She continues on the Spiriva. She is doing well in terms of her breathing but has noticed slight chest heaviness with tachycardia when she goes out in days of high humidity. Otherwise she has no cough, sputum production, dyspnea, wheezing.  Outpatient Encounter Prescriptions as of 07/10/2017  Medication Sig  . calcium carbonate (TUMS EX) 750 MG chewable tablet Chew 1 tablet by mouth daily.  . cholecalciferol (VITAMIN D) 1000 UNITS tablet Take 2,000 Units by mouth daily.   [name] ezetimibe (ZETIA) 10 MG tablet Take 1 tablet (10 mg total) by mouth daily.  . fexofenadine (ALLEGRA) 180 MG tablet Take 1 tablet (180 mg total) by mouth daily.  . fluticasone (FLONASE) 50 MCG/ACT nasal spray Place 2 sprays into both nostrils daily.  [name] lisinopril-hydrochlorothiazide (PRINZIDE,ZESTORETIC) 10-12.5 MG tablet Take 1 tablet by mouth daily. Office visit needed for additional refills  . Pitavastatin Calcium (LIVALO) 1 MG TABS Take 1 mg daily after dinner  . SPIRIVA RESPIMAT 2.5 MCG/ACT AERS USE 2 INHALATIONS DAILY  . Travoprost, BAK Free, (TRAVATAN) 0.004 % SOLN  ophthalmic solution Place 1 drop into both eyes at bedtime.   No facility-administered encounter medications on file as of 07/10/2017.     Allergies as of 07/10/2017 - Review Complete 07/10/2017  Allergen Reaction Noted  . Statins  05/15/2012    Past Medical History:  Diagnosis Date  . Arthritis   . Dyslipidemia    statin intolerance  . Hypertension   . Vertigo     Past Surgical History:  Procedure Laterality Date  . ABDOMINAL HYSTERECTOMY  1972   cervical cancer    Family History  Problem Relation Age of Onset  . Hypertension Mother        dx in her 8's  . Heart attack Mother 92  . Heart attack Brother 50  . Diabetes Brother   . Hyperlipidemia Brother   . Hypertension Brother   . Heart disease Brother     Social History   Social History  . Marital status: Married    Spouse name: [name]  . Number of children: 0  . Years of education: 12   Occupational History  .  Retired    Retired   Social History Main Topics  . Smoking status: Former Smoker    Packs/day: 0.50    Years: 32.00    Types: Cigarettes    Quit date: 11/27/2002  . Smokeless tobacco: Never Used  . Alcohol use 1.8 oz/week    3 Standard drinks or equivalent per week     Comment: social - 2 or 3 times a month  1-2 glasses of wine  . Drug use: No  . Sexual activity: Yes    Partners: Male   Other Topics Concern  . Not on file   Social History Narrative   Exercise 3 to 4 times/week walking for 45 min -1 hour   Patient lives at home with her husband Legrand Como).   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   Right handed.   decaffeine Green Tea.    Review of systems: Review of Systems  Constitutional: Negative for fever and chills.  HENT: Negative.   Eyes: Negative for blurred vision.  Respiratory: as per HPI  Cardiovascular: Negative for chest pain and palpitations.  Gastrointestinal: Negative for  vomiting, diarrhea, blood per rectum. Genitourinary: Negative for dysuria, urgency, frequency and hematuria.  Musculoskeletal: Negative for myalgias, back pain and joint pain.  Skin: Negative for itching and rash.  Neurological: Negative for dizziness, tremors, focal weakness, seizures and loss of consciousness.  Endo/Heme/Allergies: Negative for environmental allergies.  Psychiatric/Behavioral: Negative for depression, suicidal ideas and hallucinations.  All other systems reviewed and are negative.  Physical Exam: Blood pressure 126/64, pulse 83, height 5\' 4"  (1.626 m), weight 148 lb 12.8 oz (67.5 kg), SpO2 97 %. Gen:      No acute distress HEENT:  EOMI, sclera anicteric Neck:     No masses; no thyromegaly Lungs:    Clear to auscultation bilaterally; normal respiratory effort CV:         Regular rate and rhythm; no murmurs Abd:      + bowel sounds; soft, non-tender; no palpable masses, no distension Ext:    No edema; adequate peripheral perfusion Skin:      Warm and dry; no rash Neuro: alert and oriented x 3 Psych: normal mood and affect  Data Reviewed: CXR (09/01/15) No active cardiopulmonary disease. Images reviewed  PFTs 04/22/13 FVC 1.91 (73%) FEV1 1.44 (70%) F/F 75 SVC 2.05 (78%) DLCO 66%  PFTs 12/17/15 FVC 2.15 [84%) FEV1 1.70 (85%] F/F 79 TLC 93% DLCO 68% Minimal obstruction, mild reduction in diffusion  Echo 12/29/15 LV. Normal size and function. EF 55-60 percent. No evidence of pulmonary hypertension  Assessment:  COPD GOLD A Her PFTs do not show any obstruction but/F criteria but there is evidence of small airway disease as shown by mid flow rate reduction and curvature of the flow volume loop. She does have extensive smoking history and has responded well to Spiriva. We'll continue the same   Cardiac pulmonary stress test in 2014 noted where she exceeded her predicted. VO2 max. There is no evidence of any pulmonary impairment. The PFTs show a reduction in  DLCO which corrects for alveolar volume. Echo does not show any pulmonary hypertension.  Plan/Recommendations: - Continue spiriva  Marshell Garfinkel MD  Pulmonary and Critical Care Pager [phone] 07/10/2017, 3:26 PM  CC: Wendie Agreste, MD  x

## 2017-07-10 NOTE — Patient Instructions (Signed)
Continue using the Spiriva Continue keeping up with exercise program. Follow-up in one year.

## 2017-07-13 ENCOUNTER — Encounter: Payer: Self-pay | Admitting: Gastroenterology

## 2017-07-19 ENCOUNTER — Other Ambulatory Visit: Payer: Self-pay | Admitting: Family Medicine

## 2017-07-19 DIAGNOSIS — I10 Essential (primary) hypertension: Secondary | ICD-10-CM

## 2017-07-25 ENCOUNTER — Other Ambulatory Visit: Payer: Self-pay | Admitting: Family Medicine

## 2017-07-25 DIAGNOSIS — Z1231 Encounter for screening mammogram for malignant neoplasm of breast: Secondary | ICD-10-CM

## 2017-08-01 ENCOUNTER — Other Ambulatory Visit: Payer: Self-pay | Admitting: Internal Medicine

## 2017-08-27 DIAGNOSIS — H6123 Impacted cerumen, bilateral: Secondary | ICD-10-CM | POA: Diagnosis not present

## 2017-08-27 HISTORY — PX: COLONOSCOPY: SHX174

## 2017-08-28 ENCOUNTER — Ambulatory Visit
Admission: RE | Admit: 2017-08-28 | Discharge: 2017-08-28 | Disposition: A | Discharge status: Home / Self Care | Payer: Medicare Other | Source: Ambulatory Visit | Attending: Family Medicine | Admitting: Family Medicine

## 2017-08-28 DIAGNOSIS — Z1231 Encounter for screening mammogram for malignant neoplasm of breast: Secondary | ICD-10-CM

## 2017-08-29 ENCOUNTER — Ambulatory Visit (AMBULATORY_SURGERY_CENTER): Payer: Self-pay

## 2017-08-29 VITALS — Ht 63.5 in | Wt 148.0 lb

## 2017-08-29 DIAGNOSIS — Z1211 Encounter for screening for malignant neoplasm of colon: Secondary | ICD-10-CM

## 2017-08-29 MED ORDER — SUPREP BOWEL PREP KIT 17.5-3.13-1.6 GM/177ML PO SOLN: 1.0000 | Freq: Once | ORAL | 0 refills | Status: AC

## 2017-08-29 NOTE — Progress Notes (Signed)
No allergies to eggs or soy No diet meds No home oxygen No past problems with anesthesia  Declined emmi 

## 2017-09-01 ENCOUNTER — Other Ambulatory Visit: Payer: Self-pay | Admitting: Family Medicine

## 2017-09-01 ENCOUNTER — Other Ambulatory Visit: Payer: Self-pay | Admitting: Pulmonary Disease

## 2017-09-01 ENCOUNTER — Other Ambulatory Visit: Payer: Self-pay | Admitting: Internal Medicine

## 2017-09-01 DIAGNOSIS — J301 Allergic rhinitis due to pollen: Secondary | ICD-10-CM

## 2017-09-04 ENCOUNTER — Encounter: Payer: Self-pay | Admitting: Family Medicine

## 2017-09-04 ENCOUNTER — Ambulatory Visit (INDEPENDENT_AMBULATORY_CARE_PROVIDER_SITE_OTHER): Payer: Medicare Other | Admitting: Family Medicine

## 2017-09-04 VITALS — BP 128/78 | HR 77 | Temp 98.5°F | Resp 17 | Ht 64.5 in | Wt 149.0 lb

## 2017-09-04 DIAGNOSIS — Z23 Encounter for immunization: Secondary | ICD-10-CM

## 2017-09-04 DIAGNOSIS — I10 Essential (primary) hypertension: Secondary | ICD-10-CM

## 2017-09-04 DIAGNOSIS — E785 Hyperlipidemia, unspecified: Secondary | ICD-10-CM | POA: Diagnosis not present

## 2017-09-04 DIAGNOSIS — R739 Hyperglycemia, unspecified: Secondary | ICD-10-CM

## 2017-09-04 DIAGNOSIS — J301 Allergic rhinitis due to pollen: Secondary | ICD-10-CM | POA: Diagnosis not present

## 2017-09-04 DIAGNOSIS — Z0000 Encounter for general adult medical examination without abnormal findings: Secondary | ICD-10-CM

## 2017-09-04 LAB — COMPREHENSIVE METABOLIC PANEL WITH GFR
ALT: 12 [IU]/L (ref 0–32)
AST: 15 [IU]/L (ref 0–40)
Albumin/Globulin Ratio: 2.1 (ref 1.2–2.2)
Albumin: 4.5 g/dL (ref 3.6–4.8)
Alkaline Phosphatase: 67 [IU]/L (ref 39–117)
BUN/Creatinine Ratio: 19 (ref 12–28)
BUN: 16 mg/dL (ref 8–27)
Bilirubin Total: 0.5 mg/dL (ref 0.0–1.2)
CO2: 23 mmol/L (ref 20–29)
Calcium: 10.1 mg/dL (ref 8.7–10.3)
Chloride: 102 mmol/L (ref 96–106)
Creatinine, Ser: 0.84 mg/dL (ref 0.57–1.00)
GFR calc Af Amer: 83 mL/min/{1.73_m2}
GFR calc non Af Amer: 72 mL/min/{1.73_m2}
Globulin, Total: 2.1 g/dL (ref 1.5–4.5)
Glucose: 107 mg/dL — ABNORMAL HIGH (ref 65–99)
Potassium: 4 mmol/L (ref 3.5–5.2)
Sodium: 141 mmol/L (ref 134–144)
Total Protein: 6.6 g/dL (ref 6.0–8.5)

## 2017-09-04 LAB — HEMOGLOBIN A1C
Est. average glucose Bld gHb Est-mCnc: 114 mg/dL
Hgb A1c MFr Bld: 5.6 % (ref 4.8–5.6)

## 2017-09-04 MED ORDER — LISINOPRIL-HYDROCHLOROTHIAZIDE 10-12.5 MG PO TABS: ORAL_TABLET | ORAL | 3 refills | Status: DC

## 2017-09-04 MED ORDER — ZOSTER VAC RECOMB ADJUVANTED 50 MCG/0.5ML IM SUSR: 0.5000 mL | Freq: Once | INTRAMUSCULAR | 1 refills | Status: AC

## 2017-09-04 NOTE — Progress Notes (Signed)
Subjective:  This chart was scribed for Wendie Agreste, MD by Tamsen Roers, at [facility].  This patient was seen in room 3 and the patient's care was started at 8:18 AM.   Chief Complaint  Patient presents with  . Annual Exam     Patient ID: Breanna Sharp, female    DOB: [birth year], 66 y.o.   MRN: [id number]  HPI HPI Comments: Breanna Sharp is a 66 y.o. female who presents to Primary Care at [facility] for an annual wellness visit. Last wellness visit on October 2017.  History of allergic rhinitis,hypertension, hyperlipidemia, hyperglycemia, COPD and gastric ulcer. OBGYN : Dr [name]. Patient takes Advertising account planner for her allergies.     Hypertension: Is currently taking Lisinopril HCT 10-12.5 mg QD.---Patient has been compliant with her medications and denies any side effects.    Hyperlipidemia: Patient is taking Zetia 10 mg QD due to allergies/ intolerance to statins and Livalo 1 mg QD. Cardiologist: Dr. Debara Pickett Last office visit with Dr. Debara Pickett was July 11 th. --- She is compliant with the Zetia and Livalo (states she is doing better with 1 mg), denies any side effects. She now sees Dr. Debara Pickett once a year (used to be every six months).  Lab Results  Component Value Date   CHOL 188 06/07/2017   HDL 58 06/07/2017   LDLCALC 106 (H) 06/07/2017   TRIG 119 06/07/2017   CHOLHDL 3.2 06/07/2017   Lab Results  Component Value Date   ALT 15 05/11/2017   AST 15 05/11/2017   ALKPHOS 64 05/11/2017   BILITOT 0.4 05/11/2017    COPD: Patient has a Spiriva inhaler which she uses daily .Pulmonologist: Dr. Vaughan Browner (sees once a year.    Hyperglycemia: Last A1C 5.5 in 2017, she has had borderline blood sugars on other testing. -- She does not have a family history of diabetes.  Wt Readings from Last 3 Encounters:  09/04/17 149 lb (67.6 kg)  08/29/17 148 lb (67.1 kg)  07/10/17 148 lb 12.8 oz (67.5 kg)    Cancer screening: Breast cancer screening: mammogram  October 3rd. No concerns.  Colon cancer screening: colonoscopy in September 2008, by Dr. [name]. hyperplastic polyp. --Patient will be having another colonoscopy next week.   Cervical cancer screening:.Patient had a hysterectomy when she was younger--- Patient states that she had cancer around age 34 which required her to have a hysterectomy (is not able to recall the type of cancer). She has had normal pap smears since.  Bone density screening: November 2016.    Immunizations:  Immunization History  Administered Date(s) Administered  . Influenza Split 08/14/2012  . Influenza,inj,Quad PF,6+ Mos 08/20/2013, 08/26/2014, 09/01/2015, 08/31/2016  . Pneumococcal Conjugate-13 09/01/2015  . Pneumococcal Polysaccharide-23 09/07/2016  . Tdap 08/20/2013  . Zoster 09/28/2011  Shingles: She would like a prescription for a shingles vaccination today.  Patient will be receiving a flu shot today.    Fall screening: No falls in the past year   Depression Screening:  Depression screen [facility] 2/9 09/04/2017 05/16/2017 05/11/2017 04/18/2017 03/08/2017  Decreased Interest 0 0 0 0 0  Down, Depressed, Hopeless 0 0 0 0 0  PHQ - 2 Score 0 0 0 0 0   Functional status: Functional Status Survey: Is the patient deaf or have difficulty hearing?: No Does the patient have difficulty seeing, even when wearing glasses/contacts?: No Does the patient have difficulty concentrating, remembering, or making decisions?: No Does the patient have difficulty walking or climbing  stairs?: No Does the patient have difficulty dressing or bathing?: No Does the patient have difficulty doing errands alone such as visiting a doctor's office or shopping?: No   Vision:  Visual Acuity Screening   Right eye Left eye Both eyes  Without correction:     With correction: 20/30 20/25 20/25   Last saw her eye doctor 2-3 months ago.  She does not have any changes in her eyes/vision.    Exercise:Patient exercises about 5-7 days per week.  (Walks about 4 miles a day)   Dentist:Patient has a Pharmacist, community who she sees every six months.    Advanced directives: She is currently working on her living will and will bring a copy into the office when finished.    Patient Active Problem List   Diagnosis Date Noted  . COPD, group A, by GOLD 2017 classification ([place]) 06/06/2017  . Dyslipidemia 05/11/2016  . Shortness of breath 05/11/2016  . Essential hypertension 05/11/2016  . Osteopenia 10/12/2015  . DOE (dyspnea on exertion) 05/02/2013  . Chronic sinusitis 05/02/2013  . Vertigo 08/14/2012  . Benign essential HTN 08/14/2012  . BMI 28.0-28.9,adult 08/14/2012  . COLONIC POLYPS 01/24/2008  . ACUT GASTR ULCER W/O MENTION HEMORR PERF/OBST 01/24/2008   Past Medical History:  Diagnosis Date  . Arthritis   . Dyslipidemia    statin intolerance  . Hypertension   . Vertigo    Past Surgical History:  Procedure Laterality Date  . ABDOMINAL HYSTERECTOMY  1972   cervical cancer  . WISDOM TOOTH EXTRACTION     uppers   Allergies  Allergen Reactions  . Statins     Muscle cramps   Prior to Admission medications   Medication Sig Start Date End Date Taking? Authorizing Provider  calcium carbonate (TUMS EX) 750 MG chewable tablet Chew 1 tablet by mouth daily.    [provider]  cholecalciferol (VITAMIN D) 1000 UNITS tablet Take 2,000 Units by mouth daily.     [provider]  ezetimibe (ZETIA) 10 MG tablet TAKE 1 TABLET DAILY 08/01/17   Hilty, Nadean Corwin, MD  fexofenadine (ALLEGRA) 180 MG tablet Take 1 tablet (180 mg total) by mouth daily. 10/04/13   Robyn Haber, MD  fluticasone Asencion Islam) 50 MCG/ACT nasal spray USE 2 SPRAYS IN [facility] NOSTRIL DAILY 09/03/17   Wendie Agreste, MD  lisinopril-hydrochlorothiazide (PRINZIDE,ZESTORETIC) 10-12.5 MG tablet TAKE 1 TABLET DAILY (OFFICE VISIT NEEDED FOR ADDITIONAL REFILLS) 07/23/17   Wendie Agreste, MD  LIVALO 1 MG TABS TAKE 1 TABLET DAILY AFTER DINNER 09/03/17   Hilty, Nadean Corwin, MD  Polyethyl Glycol-Propyl Glycol (SYSTANE ULTRA OP) Apply 2 drops to eye daily.    [provider]  SPIRIVA RESPIMAT 2.5 MCG/ACT AERS USE 2 INHALATIONS DAILY 09/03/17   Mannam, Praveen, MD  Travoprost, BAK Free, (TRAVATAN) 0.004 % SOLN ophthalmic solution Place 1 drop into both eyes at bedtime.    [provider]   Social History   Social History  . Marital status: Married    Spouse name: [name]  . Number of children: 0  . Years of education: 12   Occupational History  .  Retired    Retired   Social History Main Topics  . Smoking status: Former Smoker    Packs/day: 0.50    Years: 32.00    Types: Cigarettes    Quit date: 11/27/2002  . Smokeless tobacco: Never Used  . Alcohol use 1.8 oz/week    3 Standard drinks or equivalent per week  Comment: social - 2 or 3 times a month 1-2 glasses of wine  . Drug use: No  . Sexual activity: Yes    Partners: Male   Other Topics Concern  . Not on file   Social History Narrative   Exercise 3 to 4 times/week walking for 45 min -1 hour   Patient lives at home with her husband Legrand Como).   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   Right handed.   decaffeine Green Tea.      Review of Systems  All other systems reviewed and are negative.      Objective:   Physical Exam  Constitutional: She is oriented to person, place, and time. She appears well-developed and well-nourished.  HENT:  Head: Normocephalic and atraumatic.  Right Ear: External ear normal.  Left Ear: External ear normal.  Mouth/Throat: Oropharynx is clear and moist.  Eyes: Pupils are equal, round, and reactive to light. Conjunctivae are normal.  Neck: Normal range of motion. Neck supple. No thyromegaly present.  Cardiovascular: Normal rate, regular rhythm, normal heart sounds and intact distal pulses.   No murmur heard. Pulmonary/Chest: Effort normal  and breath sounds normal. No respiratory distress. She has no wheezes.  Abdominal: Soft. Bowel sounds are normal. There is no tenderness.  Musculoskeletal: Normal range of motion. She exhibits no edema or tenderness.  Lymphadenopathy:    She has no cervical adenopathy.  Neurological: She is alert and oriented to person, place, and time.  Skin: Skin is warm and dry. No rash noted.  Psychiatric: She has a normal mood and affect. Her behavior is normal. Thought content normal.  Vitals reviewed.    Vitals:   09/04/17 0803  BP: 128/78  Pulse: 77  Resp: 17  Temp: 98.5 F (36.9 C)  TempSrc: Oral  SpO2: 98%  Weight: 149 lb (67.6 kg)  Height: 5' 4.5" (1.638 m)       Assessment & Plan:   Breanna Sharp is a 66 y.o. female Medicare annual wellness visit, subsequent  - anticipatory guidance as below in AVS, screening labs if needed. Health maintenance items as above in HPI discussed/recommended as applicable.   - no concerning responses on depression, fall, or functional status screening. Any positive responses noted as above. Advanced directives discussed as in CHL.   Essential hypertension - Plan: Comprehensive metabolic panel, lisinopril-hydrochlorothiazide (PRINZIDE,ZESTORETIC) 10-12.5 MG tablet  - Controlled. Continue same dose lisinopril HCTZ. CMP pending  Need for shingles vaccine - Plan: Zoster Vac Recomb Adjuvanted [facility]) injection  - Prescription printed. She can choose depending on cost to receive this, but recommended  Hyperlipidemia, unspecified hyperlipidemia type  - Tolerating Zetia, Livalo. Lipid testing a few months ago was improved. Will defer further testing today, follow-up with cardiology as planned  Allergic rhinitis due to pollen, unspecified seasonality  - Stable with Flonase, Allegra. Okay to fill Flonase for up to one year if refills needed  Hyperglycemia - Plan: Hemoglobin A1c  - Weight stable, exercising most days per week. Check  A1c.  COPD-stable, continue follow-up pulmonary  Meds ordered this encounter  Medications  . Zoster Vac Recomb Adjuvanted [facility]) injection    Sig: Inject 0.5 mLs into the muscle once. Repeat injection once in 2-6 months.    Dispense:  0.5 mL    Refill:  1  . lisinopril-hydrochlorothiazide (PRINZIDE,ZESTORETIC) 10-12.5 MG tablet    Sig: TAKE 1 TABLET DAILY  Dispense:  90 tablet    Refill:  3   Patient Instructions    Thanks for coming in today. Blood pressure looks okay, I will check your blood sugar with 3 month blood sugar test. Shingles vaccine was printed to receive at your pharmacy if you choose.  Keeping You Healthy  Get These Tests  Blood Pressure- Have your blood pressure checked by your healthcare provider at least once a year.  Normal blood pressure is 120/80.  Weight- Have your body mass index (BMI) calculated to screen for obesity.  BMI is a measure of body fat based on height and weight.  You can calculate your own BMI at [employer].it  Cholesterol- Have your cholesterol checked every year.  Diabetes- Have your blood sugar checked every year if you have high blood pressure, high cholesterol, a family history of diabetes or if you are overweight.  Pap Test - Have a pap test every 1 to 5 years if you have been sexually active.  If you are older than 65 and recent pap tests have been normal you may not need additional pap tests.  In addition, if you have had a hysterectomy  for benign disease additional pap tests are not necessary.  Mammogram-Yearly mammograms are essential for early detection of breast cancer  Screening for Colon Cancer- Colonoscopy starting at age 72. Screening may begin sooner depending on your family history and other health conditions.  Follow up colonoscopy as directed by your Gastroenterologist.  Screening for Osteoporosis- Screening begins at age 54 with bone density scanning, sooner if you are at higher risk for developing  Osteoporosis.  Get these medicines  Calcium with Vitamin D- Your body requires 1200-1500 mg of Calcium a day and [id number] IU of Vitamin D a day.  You can only absorb 500 mg of Calcium at a time therefore Calcium must be taken in 2 or 3 separate doses throughout the day.  Hormones- Hormone therapy has been associated with increased risk for certain cancers and heart disease.  Talk to your healthcare provider about if you need relief from menopausal symptoms.  Aspirin- Ask your healthcare provider about taking Aspirin to prevent Heart Disease and Stroke.  Get these Immuniztions  Flu shot- Every fall  Pneumonia shot- Once after the age of 62; if you are younger ask your healthcare provider if you need a pneumonia shot.  Tetanus- Every ten years.  Zostavax- Once after the age of 6 to prevent shingles.  Take these steps  Don't smoke- Your healthcare provider can help you quit. For tips on how to quit, ask your healthcare provider or go to www.smokefree.gov or call 1-800 QUIT-NOW.  Be physically active- Exercise 5 days a week for a minimum of 30 minutes.  If you are not already physically active, start slow and gradually work up to 30 minutes of moderate physical activity.  Try walking, dancing, bike riding, swimming, etc.  Eat a healthy diet- Eat a variety of healthy foods such as fruits, vegetables, whole grains, low fat milk, low fat cheeses, yogurt, lean meats, chicken, fish, eggs, dried beans, tofu, etc.  For more information go to www.thenutritionsource.org  Dental visit- Brush and floss teeth twice daily; visit your dentist twice a year.  Eye exam- Visit your Optometrist or Ophthalmologist yearly.  Drink alcohol in moderation- Limit alcohol intake to one drink or less a day.  Never drink and drive.  Depression- Your emotional health is as important as your physical health.  If you're feeling down or  losing interest in things you normally enjoy, please talk to your healthcare  provider.  Seat Belts- can save your life; always wear one  Smoke/Carbon Monoxide detectors- These detectors need to be installed on the appropriate level of your home.  Replace batteries at least once a year.  Violence- If anyone is threatening or hurting you, please tell your healthcare provider.  Living Will/ Health care power of attorney- Discuss with your healthcare provider and family.  IF you received an x-ray today, you will receive an invoice from [facility]. Please contact [facility] at [phone] with questions or concerns regarding your invoice.   IF you received labwork today, you will receive an invoice from [facility]. Please contact [facility] at [phone] with questions or concerns regarding your invoice.   Our billing staff will not be able to assist you with questions regarding bills from these companies.  You will be contacted with the lab results as soon as they are available. The fastest way to get your results is to activate your My Chart account. Instructions are located on the last page of this paperwork. If you have not heard from [place] regarding the results in 2 weeks, please contact this office.       I personally performed the services described in this documentation, which was scribed in my presence. The recorded information has been reviewed and considered for accuracy and completeness, addended by me as needed, and agree with information above.  Signed,   [name], MD [facility] [place].  09/04/17 8:37 AM

## 2017-09-04 NOTE — Patient Instructions (Addendum)
Thanks for coming in today. Blood pressure looks okay, I will check your blood sugar with 3 month blood sugar test. Shingles vaccine was printed to receive at your pharmacy if you choose.  Keeping You Healthy  Get These Tests  Blood Pressure- Have your blood pressure checked by your healthcare provider at least once a year.  Normal blood pressure is 120/80.  Weight- Have your body mass index (BMI) calculated to screen for obesity.  BMI is a measure of body fat based on height and weight.  You can calculate your own BMI at [employer]  Cholesterol- Have your cholesterol checked every year.  Diabetes- Have your blood sugar checked every year if you have high blood pressure, high cholesterol, a family history of diabetes or if you are overweight.  Pap Test - Have a pap test every 1 to 5 years if you have been sexually active.  If you are older than 65 and recent pap tests have been normal you may not need additional pap tests.  In addition, if you have had a hysterectomy  for benign disease additional pap tests are not necessary.  Mammogram-Yearly mammograms are essential for early detection of breast cancer  Screening for Colon Cancer- Colonoscopy starting at age 18. Screening may begin sooner depending on your family history and other health conditions.  Follow up colonoscopy as directed by your Gastroenterologist.  Screening for Osteoporosis- Screening begins at age 6 with bone density scanning, sooner if you are at higher risk for developing Osteoporosis.  Get these medicines  Calcium with Vitamin D- Your body requires 1200-1500 mg of Calcium a day and [phone] IU of Vitamin D a day.  You can only absorb 500 mg of Calcium at a time therefore Calcium must be taken in 2 or 3 separate doses throughout the day.  Hormones- Hormone therapy has been associated with increased risk for certain cancers and heart disease.  Talk to your healthcare provider about if you need relief  from menopausal symptoms.  Aspirin- Ask your healthcare provider about taking Aspirin to prevent Heart Disease and Stroke.  Get these Immuniztions  Flu shot- Every fall  Pneumonia shot- Once after the age of 17; if you are younger ask your healthcare provider if you need a pneumonia shot.  Tetanus- Every ten years.  Zostavax- Once after the age of 3 to prevent shingles.  Take these steps  Don't smoke- Your healthcare provider can help you quit. For tips on how to quit, ask your healthcare provider or go to www.smokefree.gov or call 1-800 QUIT-NOW.  Be physically active- Exercise 5 days a week for a minimum of 30 minutes.  If you are not already physically active, start slow and gradually work up to 30 minutes of moderate physical activity.  Try walking, dancing, bike riding, swimming, etc.  Eat a healthy diet- Eat a variety of healthy foods such as fruits, vegetables, whole grains, low fat milk, low fat cheeses, yogurt, lean meats, chicken, fish, eggs, dried beans, tofu, etc.  For more information go to www.thenutritionsource.org  Dental visit- Brush and floss teeth twice daily; visit your dentist twice a year.  Eye exam- Visit your Optometrist or Ophthalmologist yearly.  Drink alcohol in moderation- Limit alcohol intake to one drink or less a day.  Never drink and drive.  Depression- Your emotional health is as important as your physical health.  If you're feeling down or losing interest in things you normally enjoy, please talk to your healthcare provider.  Seat Belts- can  save your life; always wear one  Smoke/Carbon Monoxide detectors- These detectors need to be installed on the appropriate level of your home.  Replace batteries at least once a year.  Violence- If anyone is threatening or hurting you, please tell your healthcare provider.  Living Will/ Health care power of attorney- Discuss with your healthcare provider and family.  IF you received an x-ray today, you will  receive an invoice from [facility]. Please contact [facility] at [phone] with questions or concerns regarding your invoice.   IF you received labwork today, you will receive an invoice from [place]. Please contact [facility] at [phone] with questions or concerns regarding your invoice.   Our billing staff will not be able to assist you with questions regarding bills from these companies.  You will be contacted with the lab results as soon as they are available. The fastest way to get your results is to activate your My Chart account. Instructions are located on the last page of this paperwork. If you have not heard from [place] regarding the results in 2 weeks, please contact this office.

## 2017-09-12 ENCOUNTER — Ambulatory Visit (AMBULATORY_SURGERY_CENTER): Payer: Medicare Other | Admitting: Gastroenterology

## 2017-09-12 ENCOUNTER — Encounter: Payer: Self-pay | Admitting: Gastroenterology

## 2017-09-12 VITALS — BP 121/80 | HR 64 | Temp 98.0°F | Resp 22 | Ht 63.0 in | Wt 148.0 lb

## 2017-09-12 DIAGNOSIS — K635 Polyp of colon: Secondary | ICD-10-CM

## 2017-09-12 DIAGNOSIS — Z1212 Encounter for screening for malignant neoplasm of rectum: Secondary | ICD-10-CM

## 2017-09-12 DIAGNOSIS — D125 Benign neoplasm of sigmoid colon: Secondary | ICD-10-CM

## 2017-09-12 DIAGNOSIS — Z1211 Encounter for screening for malignant neoplasm of colon: Secondary | ICD-10-CM | POA: Diagnosis not present

## 2017-09-12 DIAGNOSIS — I10 Essential (primary) hypertension: Secondary | ICD-10-CM | POA: Diagnosis not present

## 2017-09-12 DIAGNOSIS — D122 Benign neoplasm of ascending colon: Secondary | ICD-10-CM

## 2017-09-12 MED ORDER — SODIUM CHLORIDE 0.9 % IV SOLN: 500.0000 mL | INTRAVENOUS | Status: DC

## 2017-09-12 NOTE — Progress Notes (Signed)
No changes in medical or surgical hx since PV 

## 2017-09-12 NOTE — Op Note (Signed)
[facility] Patient Name: Breanna Sharp Procedure Date: 09/12/2017 9:46 AM MRN: [id number] Endoscopist: Mauri Pole , MD Age: 66 Referring MD:  Date of Birth: 07/03/1951 Gender: Female Account #: [id number] Procedure:                Colonoscopy Indications:              High risk colon cancer surveillance: Personal                            history of colonic polyps Medicines:                Monitored Anesthesia Care Procedure:                Pre-Anesthesia Assessment:                           - Prior to the procedure, a History and Physical                            was performed, and patient medications and                            allergies were reviewed. The patient's tolerance of                            previous anesthesia was also reviewed. The risks                            and benefits of the procedure and the sedation                            options and risks were discussed with the patient.                            All questions were answered, and informed consent                            was obtained. Prior Anticoagulants: The patient has                            taken no previous anticoagulant or antiplatelet                            agents. ASA Grade Assessment: II - A patient with                            mild systemic disease. After reviewing the risks                            and benefits, the patient was deemed in                            satisfactory condition to undergo the procedure.  After obtaining informed consent, the colonoscope                            was passed under direct vision. Throughout the                            procedure, the patient's blood pressure, pulse, and                            oxygen saturations were monitored continuously. The                            Colonoscope was introduced through the anus and                            advanced to the the cecum,  identified by                            appendiceal orifice and ileocecal valve. The                            colonoscopy was performed without difficulty. The                            patient tolerated the procedure well. The quality                            of the bowel preparation was good. The ileocecal                            valve, appendiceal orifice, and rectum were                            photographed. Scope In: 9:53:12 AM Scope Out: 10:09:49 AM Scope Withdrawal Time: 0 hours 12 minutes 11 seconds  Total Procedure Duration: 0 hours 16 minutes 37 seconds  Findings:                 The perianal and digital rectal examinations were                            normal.                           A 2 mm polyp was found in the ascending colon. The                            polyp was sessile. The polyp was removed with a                            cold biopsy forceps. Resection and retrieval were                            complete.  Two sessile polyps were found in the sigmoid colon                            and ascending colon. The polyps were 6 to 8 mm in                            size. These polyps were removed with a cold snare.                            Resection and retrieval were complete.                           A few small-mouthed diverticula were found in the                            sigmoid colon and descending colon.                           Non-bleeding internal hemorrhoids were found during                            retroflexion. The hemorrhoids were small.                           The exam was otherwise without abnormality. Complications:            No immediate complications. Estimated Blood Loss:     Estimated blood loss was minimal. Impression:               - One 2 mm polyp in the ascending colon, removed                            with a cold biopsy forceps. Resected and retrieved.                           - Two 6 to  8 mm polyps in the sigmoid colon and in                            the ascending colon, removed with a cold snare.                            Resected and retrieved.                           - Diverticulosis in the sigmoid colon and in the                            descending colon.                           - Non-bleeding internal hemorrhoids.                           - The examination was otherwise normal. Recommendation:           -  Patient has a contact number available for                            emergencies. The signs and symptoms of potential                            delayed complications were discussed with the                            patient. Return to normal activities tomorrow.                            Written discharge instructions were provided to the                            patient.                           - Resume previous diet.                           - Continue present medications.                           - Await pathology results.                           - Repeat colonoscopy in 3 - 5 years for                            surveillance based on pathology results. Mauri Pole, MD 09/12/2017 10:14:03 AM This report has been signed electronically.

## 2017-09-12 NOTE — Progress Notes (Signed)
Report given to PACU, vss 

## 2017-09-12 NOTE — Progress Notes (Signed)
Called to room to assist during endoscopic procedure.  Patient ID and intended procedure confirmed with present staff. Received instructions for my participation in the procedure from the performing physician.  

## 2017-09-12 NOTE — Patient Instructions (Signed)
YOU HAD AN ENDOSCOPIC PROCEDURE TODAY AT THE [facility]:   Refer to the procedure report that was given to you for any specific questions about what was found during the examination.  If the procedure report does not answer your questions, please call your gastroenterologist to clarify.  If you requested that your care partner not be given the details of your procedure findings, then the procedure report has been included in a sealed envelope for you to review at your convenience later.  YOU SHOULD EXPECT: Some feelings of bloating in the abdomen. Passage of more gas than usual.  Walking can help get rid of the air that was put into your GI tract during the procedure and reduce the bloating. If you had a lower endoscopy (such as a colonoscopy or flexible sigmoidoscopy) you may notice spotting of blood in your stool or on the toilet paper. If you underwent a bowel prep for your procedure, you may not have a normal bowel movement for a few days.  Please Note:  You might notice some irritation and congestion in your nose or some drainage.  This is from the oxygen used during your procedure.  There is no need for concern and it should clear up in a day or so.  SYMPTOMS TO REPORT IMMEDIATELY:   Following lower endoscopy (colonoscopy or flexible sigmoidoscopy):  Excessive amounts of blood in the stool  Significant tenderness or worsening of abdominal pains  Swelling of the abdomen that is new, acute  Fever of 100F or higher  For urgent or emergent issues, a gastroenterologist can be reached at any hour by calling [phone].   DIET:  We do recommend a small meal at first, but then you may proceed to your regular diet.  Drink plenty of fluids but you should avoid alcoholic beverages for 24 hours.  MEDICATIONS: Continue present medications.  Please see handouts given to you by your recovery nurse.  ACTIVITY:  You should plan to take it easy for the rest of today and you should NOT  DRIVE or use heavy machinery until tomorrow (because of the sedation medicines used during the test).    FOLLOW UP: Our staff will call the number listed on your records the next business day following your procedure to check on you and address any questions or concerns that you may have regarding the information given to you following your procedure. If we do not reach you, we will leave a message.  However, if you are feeling well and you are not experiencing any problems, there is no need to return our call.  We will assume that you have returned to your regular daily activities without incident.  If any biopsies were taken you will be contacted by phone or by letter within the next 1-3 weeks.  Please call us at [phone] if you have not heard about the biopsies in 3 weeks.   Thank you for allowing us to provide for your healthcare needs today.   SIGNATURES/CONFIDENTIALITY: You and/or your care partner have signed paperwork which will be entered into your electronic medical record.  These signatures attest to the fact that that the information above on your After Visit Summary has been reviewed and is understood.  Full responsibility of the confidentiality of this discharge information lies with you and/or your care-partner. 

## 2017-09-13 ENCOUNTER — Telehealth: Payer: Self-pay | Admitting: *Deleted

## 2017-09-13 NOTE — Telephone Encounter (Signed)
  Follow up Call-  Call back number 09/12/2017  Post procedure Call Back phone  # [phone]  Permission to leave phone message Yes  Some recent data might be hidden     Patient questions:  Do you have a fever, pain , or abdominal swelling? No. Pain Score  0 *  Have you tolerated food without any problems? Yes.    Have you been able to return to your normal activities? Yes.    Do you have any questions about your discharge instructions: Diet   No. Medications  No. Follow up visit  No.  Do you have questions or concerns about your Care? No.  Actions: * If pain score is 4 or above: No action needed, pain <4.

## 2017-09-19 ENCOUNTER — Encounter: Payer: Self-pay | Admitting: Gastroenterology

## 2017-11-02 ENCOUNTER — Encounter: Payer: Self-pay | Admitting: Family Medicine

## 2017-11-02 ENCOUNTER — Ambulatory Visit (INDEPENDENT_AMBULATORY_CARE_PROVIDER_SITE_OTHER): Payer: Medicare Other | Admitting: Family Medicine

## 2017-11-02 ENCOUNTER — Other Ambulatory Visit: Payer: Self-pay

## 2017-11-02 VITALS — BP 122/78 | HR 90 | Temp 97.7°F | Ht 64.96 in | Wt 150.0 lb

## 2017-11-02 DIAGNOSIS — J069 Acute upper respiratory infection, unspecified: Secondary | ICD-10-CM | POA: Diagnosis not present

## 2017-11-02 NOTE — Progress Notes (Signed)
12/7/201811:14 AM  Breanna Sharp 07/14/1951, 66 y.o. female [id number]  Chief Complaint  Patient presents with  . Dizziness  . Sinus Problem    HPI:   Patient is a 66 y.o. female with past medical history significant for vertigo who presents today for 3 days of sinus problems. Wondering if she has a sinus infection.   Vertigo x 3 weeks, getting better 3 days of Nasal congestion with mild burning sensation and pressure Uses flonase daily mucinex for sinus helping Known vertigo, has been evaluated by neuro in the past, last seen a year ago, does do exercises for vertigo at home  Depression screen [facility] 2/9 11/02/2017 09/04/2017 05/16/2017  Decreased Interest 0 0 0  Down, Depressed, Hopeless 0 0 0  PHQ - 2 Score 0 0 0    Allergies  Allergen Reactions  . Statins     Muscle cramps    Prior to Admission medications   Medication Sig Start Date End Date Taking? Authorizing Provider  calcium carbonate (TUMS EX) 750 MG chewable tablet Chew 1 tablet by mouth daily.   Yes [provider]  cholecalciferol (VITAMIN D) 1000 UNITS tablet Take 2,000 Units by mouth daily.    Yes [provider]  ezetimibe (ZETIA) 10 MG tablet TAKE 1 TABLET DAILY 08/01/17  Yes Hilty, Nadean Corwin, MD  fexofenadine (ALLEGRA) 180 MG tablet Take 1 tablet (180 mg total) by mouth daily. 10/04/13  Yes Robyn Haber, MD  fluticasone [facility]) 50 MCG/ACT nasal spray USE 2 SPRAYS IN [facility] NOSTRIL DAILY 09/03/17  Yes Wendie Agreste, MD  lisinopril-hydrochlorothiazide (PRINZIDE,ZESTORETIC) 10-12.5 MG tablet TAKE 1 TABLET DAILY 09/04/17  Yes Wendie Agreste, MD  LIVALO 1 MG TABS TAKE 1 TABLET DAILY AFTER DINNER 09/03/17  Yes Hilty, Nadean Corwin, MD  Polyethyl Glycol-Propyl Glycol (SYSTANE ULTRA OP) Apply 2 drops to eye daily.   Yes [provider]  SPIRIVA RESPIMAT 2.5 MCG/ACT AERS USE 2 INHALATIONS DAILY 09/03/17  Yes Mannam, Praveen, MD  Travoprost, BAK Free, (TRAVATAN) 0.004 % SOLN ophthalmic  solution Place 1 drop into both eyes at bedtime.   Yes [provider]    Past Medical History:  Diagnosis Date  . Arthritis   . Dyslipidemia    statin intolerance  . Hypertension   . Vertigo     Past Surgical History:  Procedure Laterality Date  . ABDOMINAL HYSTERECTOMY  1972   cervical cancer  . COLONOSCOPY    . WISDOM TOOTH EXTRACTION     uppers    Social History   Tobacco Use  . Smoking status: Former Smoker    Packs/day: 0.50    Years: 32.00    Pack years: 16.00    Types: Cigarettes    Last attempt to quit: 11/27/2002    Years since quitting: 14.9  . Smokeless tobacco: Never Used  Substance Use Topics  . Alcohol use: Yes    Alcohol/week: 1.8 oz    Types: 3 Standard drinks or equivalent per week    Comment: social - 2 or 3 times a month 1-2 glasses of wine    Family History  Problem Relation Age of Onset  . Hypertension Mother        dx in her 85's  . Heart attack Mother 66  . Breast cancer Mother   . Heart attack Brother 68  . Diabetes Brother   . Hyperlipidemia Brother   . Hypertension Brother   . Heart disease Brother   . Colon cancer Neg  Hx   . Esophageal cancer Neg Hx   . Rectal cancer Neg Hx   . Stomach cancer Neg Hx     Review of Systems  Constitutional: Negative for chills and fever.  HENT: Positive for congestion. Negative for ear pain, sinus pain and sore throat.   Respiratory: Negative for cough and shortness of breath.   Cardiovascular: Negative for chest pain, palpitations and leg swelling.  Gastrointestinal: Positive for nausea. Negative for abdominal pain and vomiting.  Neurological: Positive for dizziness.     OBJECTIVE:  Blood pressure 122/78, pulse 90, temperature 97.7 F (36.5 C), temperature source Oral, height 5' 4.96" (1.65 m), weight 150 lb (68 kg), SpO2 97 %.  Physical Exam  Constitutional: She is oriented to person, place, and time and well-developed, well-nourished, and in no distress.  HENT:  Head:  Normocephalic and atraumatic.  Right Ear: Hearing, tympanic membrane, external ear and ear canal normal.  Left Ear: Hearing, tympanic membrane, external ear and ear canal normal.  Nose: Mucosal edema and rhinorrhea present. Right sinus exhibits no maxillary sinus tenderness and no frontal sinus tenderness. Left sinus exhibits no maxillary sinus tenderness and no frontal sinus tenderness.  Mouth/Throat: Oropharynx is clear and moist.  Eyes: EOM are normal. Pupils are equal, round, and reactive to light.  Neck: Neck supple.  Cardiovascular: Normal rate, regular rhythm and normal heart sounds. Exam reveals no gallop and no friction rub.  No murmur heard. Pulmonary/Chest: Effort normal and breath sounds normal. She has no wheezes. She has no rales.  Lymphadenopathy:    She has no cervical adenopathy.  Neurological: She is alert and oriented to person, place, and time. Gait normal.  Skin: Skin is warm and dry.    ASSESSMENT and PLAN  1. Acute upper respiratory infection Discussed supportive measures for URI: increase hydration, rest, OTC medications, etc. RTC precautions discussed.  Return if symptoms worsen or fail to improve.    Rutherford Guys, MD Primary Care at [place], [place] [address] Ph.  [phone] Fax [phone]

## 2017-11-02 NOTE — Patient Instructions (Addendum)
1. Continue with flonase and mucinex for sinus pressure and congestion 2. Start using nasal saline washes    IF you received an x-ray today, you will receive an invoice from [facility]. Please contact [facility] at [phone] with questions or concerns regarding your invoice.   IF you received labwork today, you will receive an invoice from [place]. Please contact [facility] at [phone] with questions or concerns regarding your invoice.   Our billing staff will not be able to assist you with questions regarding bills from these companies.  You will be contacted with the lab results as soon as they are available. The fastest way to get your results is to activate your My Chart account. Instructions are located on the last page of this paperwork. If you have not heard from [place] regarding the results in 2 weeks, please contact this office.

## 2017-12-03 ENCOUNTER — Other Ambulatory Visit: Payer: Self-pay

## 2017-12-03 ENCOUNTER — Telehealth: Payer: Self-pay | Admitting: Family Medicine

## 2017-12-03 ENCOUNTER — Ambulatory Visit (INDEPENDENT_AMBULATORY_CARE_PROVIDER_SITE_OTHER): Payer: Medicare Other | Admitting: Physician Assistant

## 2017-12-03 VITALS — BP 174/85 | HR 103 | Temp 98.0°F | Resp 16 | Ht 64.0 in | Wt 155.0 lb

## 2017-12-03 DIAGNOSIS — R42 Dizziness and giddiness: Secondary | ICD-10-CM

## 2017-12-03 DIAGNOSIS — I10 Essential (primary) hypertension: Secondary | ICD-10-CM

## 2017-12-03 MED ORDER — MECLIZINE HCL 25 MG PO TABS: 25.0000 mg | ORAL_TABLET | Freq: Three times a day (TID) | ORAL | 0 refills | Status: DC | PRN

## 2017-12-03 MED ORDER — IPRATROPIUM BROMIDE 0.03 % NA SOLN: 2.0000 | Freq: Two times a day (BID) | NASAL | 0 refills | Status: DC

## 2017-12-03 MED ORDER — LORATADINE 10 MG PO TABS: 10.0000 mg | ORAL_TABLET | Freq: Every day | ORAL | 11 refills | Status: DC

## 2017-12-03 NOTE — Telephone Encounter (Signed)
Pt wants to know if the  ipratropium (ATROVENT) 0.03 % nasal spray  Is replacing the  fluticasone (FLONASE) 50 MCG/ACT nasal spray  Pt wants to know what she needs to be doing with the nasal spray?  atrovent sent to local pharmacy with no refills. So she not sure what to do?

## 2017-12-03 NOTE — Progress Notes (Signed)
PRIMARY CARE AT [place], [place] [address] [phone]  Date:  12/03/2017   Name:  Breanna Sharp   DOB:  03/30/1951   MRN:  [id number]  PCP:  Wendie Agreste, MD    History of Present Illness:  Breanna Sharp is a 67 y.o. female patient who presents to PCP with  Chief Complaint  Patient presents with  . Dizziness    chronic hx/ pt had ot this wkend  . Referral    pt would like a referral     She has the room spinning, particularly when he is laying down.  It is a mild spinning.   Saturday night, she experienced sinus drainage, and nasal congestion.  No ear fullness. She has been doing the maneuvering that she was taught at therapy for her vertigo, consistent with a diagnosis of BPPV.   No facial numbness or tingling.  No sob or chest pain. No one sided weakness.   Patient is here today for a referral to [facility] neurologist.  She had tried to make an appointment, due to the lapse of being seen.   Her bp is generally normal, she reports, as well as review of previous visits is true to this.  She notes that she did eat heavy pork sausage and bacon yesterday.  She generally practices a low sodium diet, such as a nut and dairy mix in the morning.  She never has this type of food.   Denies any headaches.  Vertigo has been for the last 2 months.  She has not been able to work out due to the imbalance.  Patient Active Problem List   Diagnosis Date Noted  . COPD, group A, by GOLD 2017 classification ([place]) 06/06/2017  . Dyslipidemia 05/11/2016  . Shortness of breath 05/11/2016  . Essential hypertension 05/11/2016  . Osteopenia 10/12/2015  . DOE (dyspnea on exertion) 05/02/2013  . Chronic sinusitis 05/02/2013  . Vertigo 08/14/2012  . Benign essential HTN 08/14/2012  . BMI 28.0-28.9,adult 08/14/2012  . COLONIC POLYPS 01/24/2008  . ACUT GASTR ULCER W/O MENTION HEMORR PERF/OBST 01/24/2008    Past Medical History:  Diagnosis Date  . Arthritis   .  Dyslipidemia    statin intolerance  . Hypertension   . Vertigo     Past Surgical History:  Procedure Laterality Date  . ABDOMINAL HYSTERECTOMY  1972   cervical cancer  . COLONOSCOPY    . WISDOM TOOTH EXTRACTION     uppers    Social History   Tobacco Use  . Smoking status: Former Smoker    Packs/day: 0.50    Years: 32.00    Pack years: 16.00    Types: Cigarettes    Last attempt to quit: 11/27/2002    Years since quitting: 15.0  . Smokeless tobacco: Never Used  Substance Use Topics  . Alcohol use: Yes    Alcohol/week: 1.8 oz    Types: 3 Standard drinks or equivalent per week    Comment: social - 2 or 3 times a month 1-2 glasses of wine  . Drug use: No    Family History  Problem Relation Age of Onset  . Hypertension Mother        dx in her 20's  . Heart attack Mother 59  . Breast cancer Mother   . Heart attack Brother 68  . Diabetes Brother   . Hyperlipidemia Brother   . Hypertension Brother   . Heart disease Brother   . Colon cancer  Neg Hx   . Esophageal cancer Neg Hx   . Rectal cancer Neg Hx   . Stomach cancer Neg Hx     Allergies  Allergen Reactions  . Statins     Muscle cramps    Medication list has been reviewed and updated.  Current Outpatient Medications on File Prior to Visit  Medication Sig Dispense Refill  . calcium carbonate (TUMS EX) 750 MG chewable tablet Chew 1 tablet by mouth daily.    . cholecalciferol (VITAMIN D) 1000 UNITS tablet Take 2,000 Units by mouth daily.     [name] ezetimibe (ZETIA) 10 MG tablet TAKE 1 TABLET DAILY 90 tablet 3  . fexofenadine (ALLEGRA) 180 MG tablet Take 1 tablet (180 mg total) by mouth daily. 90 tablet 3  . fluticasone (FLONASE) 50 MCG/ACT nasal spray USE 2 SPRAYS IN EACH NOSTRIL DAILY 48 g 0  . lisinopril-hydrochlorothiazide (PRINZIDE,ZESTORETIC) 10-12.5 MG tablet TAKE 1 TABLET DAILY 90 tablet 3  . LIVALO 1 MG TABS TAKE 1 TABLET DAILY AFTER DINNER 90 tablet 3  . Polyethyl Glycol-Propyl Glycol (SYSTANE ULTRA OP)  Apply 2 drops to eye daily.    [name] SPIRIVA RESPIMAT 2.5 MCG/ACT AERS USE 2 INHALATIONS DAILY 12 g 1  . Travoprost, BAK Free, (TRAVATAN) 0.004 % SOLN ophthalmic solution Place 1 drop into both eyes at bedtime.     Current Facility-Administered Medications on File Prior to Visit  Medication Dose Route Frequency Provider Last Rate Last Dose  . 0.9 %  sodium chloride infusion  500 mL Intravenous Continuous Nandigam, Kavitha V, MD        ROS ROS otherwise unremarkable unless listed above.  Physical Examination: BP (!) 174/85   Pulse (!) 103   Temp 98 F (36.7 C) (Oral)   Resp 16   Ht 5\' 4"  (1.626 m)   Wt 155 lb (70.3 kg)   SpO2 96%   BMI 26.61 kg/m  Ideal Body Weight: Weight in (lb) to have BMI = 25: 145.3  Physical Exam  Constitutional: She is oriented to person, place, and time. She appears well-developed and well-nourished. No distress.  HENT:  Head: Normocephalic and atraumatic.  Right Ear: Tympanic membrane, external ear and ear canal normal.  Left Ear: Tympanic membrane, external ear and ear canal normal.  Nose: Mucosal edema and rhinorrhea present. Right sinus exhibits no maxillary sinus tenderness and no frontal sinus tenderness. Left sinus exhibits no maxillary sinus tenderness and no frontal sinus tenderness.  Mouth/Throat: No uvula swelling. No oropharyngeal exudate, posterior oropharyngeal edema or posterior oropharyngeal erythema.  Eyes: Conjunctivae and EOM are normal. Pupils are equal, round, and reactive to light.  Cardiovascular: Normal rate, regular rhythm and intact distal pulses. Exam reveals no gallop, no distant heart sounds and no friction rub.  No murmur heard. Pulses:      Radial pulses are 2+ on the right side, and 2+ on the left side.       Dorsalis pedis pulses are 2+ on the right side, and 2+ on the left side.  Pulmonary/Chest: Effort normal and breath sounds normal. No respiratory distress. She has no decreased breath sounds. She has no wheezes. She has  no rhonchi.  Lymphadenopathy:       Head (right side): No submandibular, no tonsillar, no preauricular and no posterior auricular adenopathy present.       Head (left side): No submandibular, no tonsillar, no preauricular and no posterior auricular adenopathy present.  Neurological: She is alert and oriented to person, place, and time.  No cranial nerve deficit.  Skin: She is not diaphoretic.  Psychiatric: She has a normal mood and affect. Her behavior is normal.   Orthostatic VS for the past 24 hrs (Last 3 readings):  BP- Lying Pulse- Lying BP- Sitting Pulse- Sitting BP- Standing at 0 minutes Pulse- Standing at 0 minutes  12/03/17 0905 155/78 89 174/85 103 162/83 96     Assessment and Plan: Breanna Sharp is a 67 y.o. female who is here today for cc of  Chief Complaint  Patient presents with  . Dizziness    chronic hx/ pt had ot this wkend  . Referral    pt would like a referral   Discussed food diet with the elevated bp.  Advised to monitor at home.  She generally has a bp that is therapeutic to her.  I do not think this is correlated to her current symptoms.  Her bp was normal less than 5 weeks ago.  She also has been dealing with the vertigo as a chronic issue.  Vertigo is likely secondary to etd.  Changing 2nd anti-histamine to claritin, and adding an additional spray.  Recommended humidifier and nasal saline as well.   Vertigo - Plan: Ambulatory referral to Neurology  Essential hypertension - Plan: meclizine (ANTIVERT) 25 MG tablet, Basic metabolic panel, TSH, loratadine (CLARITIN) 10 MG tablet  Ivar Drape, PA-C [facility] 1/7/201910:42 AM

## 2017-12-03 NOTE — Patient Instructions (Addendum)
Please hydrate well with water.   I would like you to check your blood pressure twice per week for the next 2 weeks.  If it does not go back down to <140/90, please return I have placed the referral We will try a different nasal spray, and allergy medication.  We may need to add a different type of allergy medication if this is not working, so return if it does not improve within 2 weeks as well.  I would also like you to try to make sure your home is not overly dry.  If you need a humidifier in your room, do that at this time.  Eustachian Tube Dysfunction The eustachian tube connects the middle ear to the back of the nose. It regulates air pressure in the middle ear by allowing air to move between the ear and nose. It also helps to drain fluid from the middle ear space. When the eustachian tube does not function properly, air pressure, fluid, or both can build up in the middle ear. Eustachian tube dysfunction can affect one or both ears. What are the causes? This condition happens when the eustachian tube becomes blocked or cannot open normally. This may result from:  Ear infections.  Colds and other upper respiratory infections.  Allergies.  Irritation, such as from cigarette smoke or acid from the stomach coming up into the esophagus (gastroesophageal reflux).  Sudden changes in air pressure, such as from descending in an airplane.  Abnormal growths in the nose or throat, such as nasal polyps, tumors, or enlarged tissue at the back of the throat (adenoids).  What increases the risk? This condition may be more likely to develop in people who smoke and people who are overweight. Eustachian tube dysfunction may also be more likely to develop in children, especially children who have:  Certain birth defects of the mouth, such as cleft palate.  Large tonsils and adenoids.  What are the signs or symptoms? Symptoms of this condition may include:  A feeling of fullness in the ear.  Ear  pain.  Clicking or popping noises in the ear.  Ringing in the ear.  Hearing loss.  Loss of balance.  Symptoms may get worse when the air pressure around you changes, such as when you travel to an area of high elevation or fly on an airplane. How is this diagnosed? This condition may be diagnosed based on:  Your symptoms.  A physical exam of your ear, nose, and throat.  Tests, such as those that measure: ? The movement of your eardrum (tympanogram). ? Your hearing (audiometry).  How is this treated? Treatment depends on the cause and severity of your condition. If your symptoms are mild, you may be able to relieve your symptoms by moving air into ("popping") your ears. If you have symptoms of fluid in your ears, treatment may include:  Decongestants.  Antihistamines.  Nasal sprays or ear drops that contain medicines that reduce swelling (steroids).  In some cases, you may need to have a procedure to drain the fluid in your eardrum (myringotomy). In this procedure, a small tube is placed in the eardrum to:  Drain the fluid.  Restore the air in the middle ear space.  Follow these instructions at home:  Take over-the-counter and prescription medicines only as told by your health care provider.  Use techniques to help pop your ears as recommended by your health care provider. These may include: ? Chewing gum. ? Yawning. ? Frequent, forceful swallowing. ? Closing  your mouth, holding your nose closed, and gently blowing as if you are trying to blow air out of your nose.  Do not do any of the following until your health care provider approves: ? Travel to high altitudes. ? Fly in airplanes. ? Work in a Pension scheme manager or room. ? Scuba dive.  Keep your ears dry. Dry your ears completely after showering or bathing.  Do not smoke.  Keep all follow-up visits as told by your health care provider. This is important. Contact a health care provider if:  Your symptoms do  not go away after treatment.  Your symptoms come back after treatment.  You are unable to pop your ears.  You have: ? A fever. ? Pain in your ear. ? Pain in your head or neck. ? Fluid draining from your ear.  Your hearing suddenly changes.  You become very dizzy.  You lose your balance. This information is not intended to replace advice given to you by your health care provider. Make sure you discuss any questions you have with your health care provider. Document Released: 12/10/2015 Document Revised: 04/20/2016 Document Reviewed: 12/02/2014 Elsevier Interactive Patient Education  2018 [employer].     IF you received an x-ray today, you will receive an invoice from [facility]. Please contact [facility] at [phone] with questions or concerns regarding your invoice.   IF you received labwork today, you will receive an invoice from [place]. Please contact [facility] at [phone] with questions or concerns regarding your invoice.   Our billing staff will not be able to assist you with questions regarding bills from these companies.  You will be contacted with the lab results as soon as they are available. The fastest way to get your results is to activate your My Chart account. Instructions are located on the last page of this paperwork. If you have not heard from [place] regarding the results in 2 weeks, please contact this office.

## 2017-12-03 NOTE — Telephone Encounter (Signed)
Copied from [place] [id number]. Topic: Quick Communication - See Telephone Encounter >> Dec 03, 2017  1:35 PM Bea Graff, NT wrote: CRM for notification. See Telephone encounter for: Pt would like ipratropium (ATROVENT) sent to [facility]. 90 day supply.   12/03/17.

## 2017-12-04 ENCOUNTER — Other Ambulatory Visit: Payer: Self-pay | Admitting: *Deleted

## 2017-12-04 DIAGNOSIS — H25043 Posterior subcapsular polar age-related cataract, bilateral: Secondary | ICD-10-CM | POA: Diagnosis not present

## 2017-12-04 DIAGNOSIS — H401131 Primary open-angle glaucoma, bilateral, mild stage: Secondary | ICD-10-CM | POA: Diagnosis not present

## 2017-12-04 DIAGNOSIS — H25013 Cortical age-related cataract, bilateral: Secondary | ICD-10-CM | POA: Diagnosis not present

## 2017-12-04 DIAGNOSIS — H2513 Age-related nuclear cataract, bilateral: Secondary | ICD-10-CM | POA: Diagnosis not present

## 2017-12-04 LAB — BASIC METABOLIC PANEL
BUN/Creatinine Ratio: 16 (ref 12–28)
BUN: 12 mg/dL (ref 8–27)
CO2: 25 mmol/L (ref 20–29)
Calcium: 9.9 mg/dL (ref 8.7–10.3)
Chloride: 105 mmol/L (ref 96–106)
Creatinine, Ser: 0.75 mg/dL (ref 0.57–1.00)
GFR calc Af Amer: 95 mL/min/{1.73_m2} (ref >59)
GFR calc non Af Amer: 83 mL/min/{1.73_m2} (ref >59)
Glucose: 115 mg/dL — ABNORMAL HIGH (ref 65–99)
Potassium: 3.9 mmol/L (ref 3.5–5.2)
Sodium: 144 mmol/L (ref 134–144)

## 2017-12-04 LAB — TSH: TSH: 2.39 u[IU]/mL (ref 0.450–4.500)

## 2017-12-04 MED ORDER — IPRATROPIUM BROMIDE 0.03 % NA SOLN: 2.0000 | Freq: Two times a day (BID) | NASAL | 0 refills | Status: DC

## 2017-12-04 NOTE — Telephone Encounter (Signed)
Patient informed changed prescription to [facility]

## 2017-12-31 ENCOUNTER — Telehealth: Payer: Self-pay

## 2017-12-31 NOTE — Telephone Encounter (Signed)
Copied from [place]. Topic: General - Other >> Dec 31, 2017  9:36 AM Carolyn Stare wrote:  Pt would like a call back concerning a nose spray, Ivar Drape had given her ipratropium (ATROVENT) 0.03 % nasal spray and she use to use flonase and is asking if this ipratropium is replacing the flonase.   [phone]   would like a call back

## 2018-01-01 NOTE — Telephone Encounter (Signed)
This was only short term, hence the no refill.  She can continue the flonase.

## 2018-01-01 NOTE — Telephone Encounter (Signed)
See below note.

## 2018-01-01 NOTE — Telephone Encounter (Signed)
Patient should continue flonase.

## 2018-01-14 ENCOUNTER — Other Ambulatory Visit: Payer: Self-pay

## 2018-01-14 ENCOUNTER — Ambulatory Visit (INDEPENDENT_AMBULATORY_CARE_PROVIDER_SITE_OTHER): Payer: Medicare Other | Admitting: Family Medicine

## 2018-01-14 ENCOUNTER — Encounter: Payer: Self-pay | Admitting: Family Medicine

## 2018-01-14 VITALS — BP 124/62 | HR 102 | Temp 98.4°F | Resp 18 | Ht 64.0 in | Wt 148.2 lb

## 2018-01-14 DIAGNOSIS — E785 Hyperlipidemia, unspecified: Secondary | ICD-10-CM | POA: Diagnosis not present

## 2018-01-14 DIAGNOSIS — I10 Essential (primary) hypertension: Secondary | ICD-10-CM

## 2018-01-14 DIAGNOSIS — R739 Hyperglycemia, unspecified: Secondary | ICD-10-CM | POA: Diagnosis not present

## 2018-01-14 NOTE — Patient Instructions (Addendum)
OK to continue monitoring blood pressure every few days. If persistent readings over 140/90,  return to discuss changes. Watch sodium in diet as that may affect blood pressure as well.   I will check cholesterol and diabetes screening test on fasting blood work. Return at your convenience for those tests.  Recheck in 6 months, sooner if needed.   How to Take Your Blood Pressure You can take your blood pressure at home with a machine. You may need to check your blood pressure at home:  To check if you have high blood pressure (hypertension).  To check your blood pressure over time.  To make sure your blood pressure medicine is working.  Supplies needed: You will need a blood pressure machine, or monitor. You can buy one at a drugstore or online. When choosing one:  Choose one with an arm cuff.  Choose one that wraps around your upper arm. Only one finger should fit between your arm and the cuff.  Do not choose one that measures your blood pressure from your wrist or finger.  Your doctor can suggest a monitor. How to prepare Avoid these things for 30 minutes before checking your blood pressure:  Drinking caffeine.  Drinking alcohol.  Eating.  Smoking.  Exercising.  Five minutes before checking your blood pressure:  Pee.  Sit in a dining chair. Avoid sitting in a soft couch or armchair.  Be quiet. Do not talk.  How to take your blood pressure Follow the instructions that came with your machine. If you have a digital blood pressure monitor, these may be the instructions: 1. Sit up straight. 2. Place your feet on the floor. Do not cross your ankles or legs. 3. Rest your left arm at the level of your heart. You may rest it on a table, desk, or chair. 4. Pull up your shirt sleeve. 5. Wrap the blood pressure cuff around the upper part of your left arm. The cuff should be 1 inch (2.5 cm) above your elbow. It is best to wrap the cuff around bare skin. 6. Fit the cuff  snugly around your arm. You should be able to place only one finger between the cuff and your arm. 7. Put the cord inside the groove of your elbow. 8. Press the power button. 9. Sit quietly while the cuff fills with air and loses air. 10. Write down the numbers on the screen. 11. Wait 2-3 minutes and then repeat steps 1-10.  What do the numbers mean? Two numbers make up your blood pressure. The first number is called systolic pressure. The second is called diastolic pressure. An example of a blood pressure reading is "120 over 80" (or 120/80). If you are an adult and do not have a medical condition, use this guide to find out if your blood pressure is normal: Normal  First number: below 120.  Second number: below 80. Elevated  First number: 120-129.  Second number: below 80. Hypertension stage 1  First number: 130-139.  Second number: 80-89. Hypertension stage 2  First number: 140 or above.  Second number: 45 or above. Your blood pressure is above normal even if only the top or bottom number is above normal. Follow these instructions at home:  Check your blood pressure as often as your doctor tells you to.  Take your monitor to your next doctor's appointment. Your doctor will: ? Make sure you are using it correctly. ? Make sure it is working right.  Make sure you understand what  your blood pressure numbers should be.  Tell your doctor if your medicines are causing side effects. Contact a doctor if:  Your blood pressure keeps being high. Get help right away if:  Your first blood pressure number is higher than 180.  Your second blood pressure number is higher than 120. This information is not intended to replace advice given to you by your health care provider. Make sure you discuss any questions you have with your health care provider. Document Released: 10/26/2008 Document Revised: 10/11/2016 Document Reviewed: 04/21/2016 Elsevier Interactive Patient Education   2018 [employer].    IF you received an x-ray today, you will receive an invoice from [facility]. Please contact [facility] at [phone] with questions or concerns regarding your invoice.   IF you received labwork today, you will receive an invoice from [place]. Please contact [facility] at [phone] with questions or concerns regarding your invoice.   Our billing staff will not be able to assist you with questions regarding bills from these companies.  You will be contacted with the lab results as soon as they are available. The fastest way to get your results is to activate your My Chart account. Instructions are located on the last page of this paperwork. If you have not heard from [place] regarding the results in 2 weeks, please contact this office.

## 2018-01-14 NOTE — Progress Notes (Signed)
Subjective:  By signing my name below, I, Breanna Sharp, attest that this documentation has been prepared under the direction and in the presence of Wendie Agreste, MD Electronically Signed: Ladene Artist, ED Scribe 01/14/2018 at 2:20 PM.   Patient ID: Breanna Sharp, female    DOB: Jun 01, 1951, 67 y.o.   MRN: [id number]  Chief Complaint  Patient presents with  . Hypertension    check    HPI Breanna Sharp is a 67 y.o. female who presents to Primary Care at [facility] for f/u of HTN. Last seen in Oct for annual wellness visit. She was taking lisinopril-HCTZ 10-2.5 mg. Pressure was controlled. BP Readings from Last 3 Encounters:  01/14/18 124/62  12/03/17 (!) 174/85  11/02/17 122/78   Lab Results  Component Value Date   CREATININE 0.75 12/03/2017  Pt has been monitoring her BP at home with readings averaging from 115-120/75. States her BP elevated to 144/86 1 night, 160/90 the following night and then dropped back down to the 120s for 2-3 days. Reports morning readings of 156/86 yesterday morning and 135/81 this morning. Pt walks 2-6 miles/day. Denies cp, sob, lightheadedness, dizziness, HAs, changes in diet. Last alcoholic beverage was 3-4 wks ago.  Hyperlipidemia Lab Results  Component Value Date   CHOL 188 06/07/2017   HDL 58 06/07/2017   LDLCALC 106 (H) 06/07/2017   TRIG 119 06/07/2017   CHOLHDL 3.2 06/07/2017   Lab Results  Component Value Date   ALT 12 09/04/2017   AST 15 09/04/2017   ALKPHOS 67 09/04/2017   BILITOT 0.5 09/04/2017  Takes Zetia and Livalo. Cardiologist is Dr. Debara Pickett. Intolerant to statins. Pt is not fasting at this visit.  Hyperglycemia Normal A1C in Oct. Slightly elevated glucose on labs last month.  COPD Followed by Dr. Concepcion Living for COPD. Uses Spiriva.  Patient Active Problem List   Diagnosis Date Noted  . COPD, group A, by GOLD 2017 classification ([place]) 06/06/2017  . Dyslipidemia 05/11/2016  . Shortness of breath 05/11/2016  . Essential  hypertension 05/11/2016  . Osteopenia 10/12/2015  . DOE (dyspnea on exertion) 05/02/2013  . Chronic sinusitis 05/02/2013  . Vertigo 08/14/2012  . Benign essential HTN 08/14/2012  . BMI 28.0-28.9,adult 08/14/2012  . COLONIC POLYPS 01/24/2008  . ACUT GASTR ULCER W/O MENTION HEMORR PERF/OBST 01/24/2008   Past Medical History:  Diagnosis Date  . Arthritis   . Dyslipidemia    statin intolerance  . Hypertension   . Vertigo    Past Surgical History:  Procedure Laterality Date  . ABDOMINAL HYSTERECTOMY  1972   cervical cancer  . COLONOSCOPY    . WISDOM TOOTH EXTRACTION     uppers   Allergies  Allergen Reactions  . Statins     Muscle cramps   Prior to Admission medications   Medication Sig Start Date End Date Taking? Authorizing Provider  calcium carbonate (TUMS EX) 750 MG chewable tablet Chew 1 tablet by mouth daily.    [provider]  cholecalciferol (VITAMIN D) 1000 UNITS tablet Take 2,000 Units by mouth daily.     [provider]  ezetimibe (ZETIA) 10 MG tablet TAKE 1 TABLET DAILY 08/01/17   Hilty, Nadean Corwin, MD  fexofenadine (ALLEGRA) 180 MG tablet Take 1 tablet (180 mg total) by mouth daily. 10/04/13   Robyn Haber, MD  fluticasone [facility]) 50 MCG/ACT nasal spray USE 2 SPRAYS IN [facility] NOSTRIL DAILY 09/03/17   Wendie Agreste, MD  ipratropium (ATROVENT) 0.03 % nasal spray Place 2  sprays into both nostrils 2 (two) times daily. 12/04/17   Ivar Drape D, PA  lisinopril-hydrochlorothiazide (PRINZIDE,ZESTORETIC) 10-12.5 MG tablet TAKE 1 TABLET DAILY 09/04/17   Wendie Agreste, MD  LIVALO 1 MG TABS TAKE 1 TABLET DAILY AFTER DINNER 09/03/17   Hilty, Nadean Corwin, MD  loratadine (CLARITIN) 10 MG tablet Take 1 tablet (10 mg total) by mouth daily. 12/03/17   Ivar Drape D, PA  meclizine (ANTIVERT) 25 MG tablet Take 1 tablet (25 mg total) by mouth 3 (three) times daily as needed for dizziness. 12/03/17   Ivar Drape D, PA  Polyethyl Glycol-Propyl Glycol  (SYSTANE ULTRA OP) Apply 2 drops to eye daily.    [provider]  SPIRIVA RESPIMAT 2.5 MCG/ACT AERS USE 2 INHALATIONS DAILY 09/03/17   Mannam, Praveen, MD  Travoprost, BAK Free, (TRAVATAN) 0.004 % SOLN ophthalmic solution Place 1 drop into both eyes at bedtime.    [provider]   Social History   Socioeconomic History  . Marital status: Married    Spouse name: [name]  . Number of children: 0  . Years of education: 42  . Highest education level: Not on file  Social Needs  . Financial resource strain: Not on file  . Food insecurity - worry: Not on file  . Food insecurity - inability: Not on file  . Transportation needs - medical: Not on file  . Transportation needs - non-medical: Not on file  Occupational History    Employer: RETIRED    Comment: Retired  Tobacco Use  . Smoking status: Former Smoker    Packs/day: 0.50    Years: 32.00    Pack years: 16.00    Types: Cigarettes    Last attempt to quit: 11/27/2002    Years since quitting: 15.1  . Smokeless tobacco: Never Used  Substance and Sexual Activity  . Alcohol use: Yes    Alcohol/week: 1.8 oz    Types: 3 Standard drinks or equivalent per week    Comment: social - 2 or 3 times a month 1-2 glasses of wine  . Drug use: No  . Sexual activity: Yes    Partners: Male  Other Topics Concern  . Not on file  Social History Narrative   Exercise 3 to 4 times/week walking for 45 min -1 hour   Patient lives at home with her husband Legrand Como).   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   Right handed.   decaffeine Green Tea.   Review of Systems  Constitutional: Negative for fatigue and unexpected weight change.  Respiratory: Negative for chest tightness and shortness of breath.   Cardiovascular: Negative for chest pain, palpitations and leg swelling.  Gastrointestinal: Negative for abdominal pain and blood in stool.    Neurological: Negative for dizziness, syncope, light-headedness and headaches.      Objective:   Physical Exam  Constitutional: She is oriented to person, place, and time. She appears well-developed and well-nourished.  HENT:  Head: Normocephalic and atraumatic.  Eyes: Conjunctivae and EOM are normal. Pupils are equal, round, and reactive to light.  Neck: Carotid bruit is not present.  Cardiovascular: Normal rate, regular rhythm, normal heart sounds and intact distal pulses.  Pulmonary/Chest: Effort normal and breath sounds normal.  Abdominal: Soft. She exhibits no pulsatile midline mass. There is no tenderness.  Musculoskeletal: She exhibits no edema.  Neurological: She is alert and oriented to person, place, and time.  Skin: Skin is warm and dry.  Psychiatric: She has a normal mood and affect. Her behavior is normal.  Vitals reviewed.  Vitals:   01/14/18 1406  BP: 124/62  Pulse: (!) 102  Resp: 18  Temp: 98.4 F (36.9 C)  TempSrc: Oral  SpO2: 98%  Weight: 148 lb 3.2 oz (67.2 kg)  Height: 5\' 4"  (1.626 m)      Assessment & Plan:   Breanna Sharp is a 67 y.o. female Essential hypertension  - Some variability on home readings, but overall appears to be under 140/90. She did have some readings at the lower 100s, so would not necessarily want to increase medication at this time. Discussed sodium intake and affect on blood pressure, continue monitoring. Also gave instructions on correct home blood pressure monitoring. RTC precautions of persistent elevations.    Hyperglycemia - Plan: Hemoglobin A1c  -Return for fasting blood work for A1c. Prior A1c was in normal range, but recent hyperglycemia on labs in January  Hyperlipidemia, unspecified hyperlipidemia type - Plan: Lipid panel, Hepatic Function Panel  -Currently on Zetia, Livalo. Plan for fasting lab visit to recheck levels. No changes for now, follow up in 6 months.  No orders of the defined types were placed in this  encounter.  Patient Instructions    OK to continue monitoring blood pressure every few days. If persistent readings over 140/90,  return to discuss changes. Watch sodium in diet as that may affect blood pressure as well.   I will check cholesterol and diabetes screening test on fasting blood work. Return at your convenience for those tests.  Recheck in 6 months, sooner if needed.   How to Take Your Blood Pressure You can take your blood pressure at home with a machine. You may need to check your blood pressure at home:  To check if you have high blood pressure (hypertension).  To check your blood pressure over time.  To make sure your blood pressure medicine is working.  Supplies needed: You will need a blood pressure machine, or monitor. You can buy one at a drugstore or online. When choosing one:  Choose one with an arm cuff.  Choose one that wraps around your upper arm. Only one finger should fit between your arm and the cuff.  Do not choose one that measures your blood pressure from your wrist or finger.  Your doctor can suggest a monitor. How to prepare Avoid these things for 30 minutes before checking your blood pressure:  Drinking caffeine.  Drinking alcohol.  Eating.  Smoking.  Exercising.  Five minutes before checking your blood pressure:  Pee.  Sit in a dining chair. Avoid sitting in a soft couch or armchair.  Be quiet. Do not talk.  How to take your blood pressure Follow the instructions that came with your machine. If you have a digital blood pressure monitor, these may be the instructions: 1. Sit up straight. 2. Place your feet on the floor. Do not cross your ankles or legs. 3. Rest your left arm at the level of your heart. You may rest it on a table, desk, or chair. 4. Pull up your shirt sleeve. 5. Wrap the blood pressure cuff around the upper part of your left arm. The cuff should be 1 inch (2.5 cm) above your elbow. It is best to wrap the cuff  around bare skin. 6. Fit the cuff snugly around your arm. You should be able to place only one finger between the cuff and  your arm. 7. Put the cord inside the groove of your elbow. 8. Press the power button. 9. Sit quietly while the cuff fills with air and loses air. 10. Write down the numbers on the screen. 11. Wait 2-3 minutes and then repeat steps 1-10.  What do the numbers mean? Two numbers make up your blood pressure. The first number is called systolic pressure. The second is called diastolic pressure. An example of a blood pressure reading is "120 over 80" (or 120/80). If you are an adult and do not have a medical condition, use this guide to find out if your blood pressure is normal: Normal  First number: below 120.  Second number: below 80. Elevated  First number: 120-129.  Second number: below 80. Hypertension stage 1  First number: 130-139.  Second number: 80-89. Hypertension stage 2  First number: 140 or above.  Second number: 82 or above. Your blood pressure is above normal even if only the top or bottom number is above normal. Follow these instructions at home:  Check your blood pressure as often as your doctor tells you to.  Take your monitor to your next doctor's appointment. Your doctor will: ? Make sure you are using it correctly. ? Make sure it is working right.  Make sure you understand what your blood pressure numbers should be.  Tell your doctor if your medicines are causing side effects. Contact a doctor if:  Your blood pressure keeps being high. Get help right away if:  Your first blood pressure number is higher than 180.  Your second blood pressure number is higher than 120. This information is not intended to replace advice given to you by your health care provider. Make sure you discuss any questions you have with your health care provider. Document Released: 10/26/2008 Document Revised: 10/11/2016 Document Reviewed: 04/21/2016 Elsevier  Interactive Patient Education  2018 [employer].    IF you received an x-ray today, you will receive an invoice from [facility]. Please contact [facility] at [phone] with questions or concerns regarding your invoice.   IF you received labwork today, you will receive an invoice from [facility]. Please contact [facility] at [phone] with questions or concerns regarding your invoice.   Our billing staff will not be able to assist you with questions regarding bills from these companies.  You will be contacted with the lab results as soon as they are available. The fastest way to get your results is to activate your My Chart account. Instructions are located on the last page of this paperwork. If you have not heard from [place] regarding the results in 2 weeks, please contact this office.      I personally performed the services described in this documentation, which was scribed in my presence. The recorded information has been reviewed and considered for accuracy and completeness, addended by me as needed, and agree with information above.  Signed,   [name], MD [facility] at [facility].  01/14/18 2:39 PM

## 2018-01-16 ENCOUNTER — Ambulatory Visit (INDEPENDENT_AMBULATORY_CARE_PROVIDER_SITE_OTHER): Payer: Medicare Other | Admitting: Family Medicine

## 2018-01-16 DIAGNOSIS — E785 Hyperlipidemia, unspecified: Secondary | ICD-10-CM

## 2018-01-16 DIAGNOSIS — R739 Hyperglycemia, unspecified: Secondary | ICD-10-CM | POA: Diagnosis not present

## 2018-01-16 NOTE — Progress Notes (Signed)
Lab visit only.  Pt not seen by provider.

## 2018-01-17 LAB — HEPATIC FUNCTION PANEL
ALT: 16 IU/L (ref 0–32)
AST: 14 IU/L (ref 0–40)
Albumin: 4.5 g/dL (ref 3.6–4.8)
Alkaline Phosphatase: 70 IU/L (ref 39–117)
Bilirubin Total: 0.5 mg/dL (ref 0.0–1.2)
Bilirubin, Direct: 0.12 mg/dL (ref 0.00–0.40)
Total Protein: 6.9 g/dL (ref 6.0–8.5)

## 2018-01-17 LAB — HEMOGLOBIN A1C
Est. average glucose Bld gHb Est-mCnc: 114 mg/dL
Hgb A1c MFr Bld: 5.6 % (ref 4.8–5.6)

## 2018-01-17 LAB — LIPID PANEL
Chol/HDL Ratio: 3.2 ratio (ref 0.0–4.4)
Cholesterol, Total: 182 mg/dL (ref 100–199)
HDL: 57 mg/dL (ref >39)
LDL Calculated: 101 mg/dL — ABNORMAL HIGH (ref 0–99)
Triglycerides: 119 mg/dL (ref 0–149)
VLDL Cholesterol Cal: 24 mg/dL (ref 5–40)

## 2018-01-30 ENCOUNTER — Encounter: Payer: Self-pay | Admitting: Neurology

## 2018-01-30 ENCOUNTER — Ambulatory Visit (INDEPENDENT_AMBULATORY_CARE_PROVIDER_SITE_OTHER): Payer: Medicare Other | Admitting: Neurology

## 2018-01-30 VITALS — BP 144/87 | HR 81 | Ht 63.5 in | Wt 149.0 lb

## 2018-01-30 DIAGNOSIS — R42 Dizziness and giddiness: Secondary | ICD-10-CM

## 2018-01-30 DIAGNOSIS — A881 Epidemic vertigo: Secondary | ICD-10-CM | POA: Insufficient documentation

## 2018-01-30 DIAGNOSIS — H81399 Other peripheral vertigo, unspecified ear: Secondary | ICD-10-CM | POA: Diagnosis not present

## 2018-01-30 MED ORDER — ALPRAZOLAM 1 MG PO TABS: 1.0000 mg | ORAL_TABLET | Freq: Every evening | ORAL | 0 refills | Status: DC | PRN

## 2018-01-30 NOTE — Progress Notes (Signed)
PATIENT: BARB SHEAR DOB: [birth year]  Chief Complaint  Patient presents with  . New Patient (Initial Visit)    PCP: Dr.Greene, Referring: Ivar Drape, PA. Pt complains of off and on dizziness and vertigo. Orthos- lying: 143/85, 79. sitting- 144/87, 81. standing- 131/85, 64. Vision: 20/100 bilaterally with correction     HISTORICAL  TIOMBE TOMEO is a 67 year old female, seen in refer by her primary care doctor [name] for evaluation of dizziness, vertigo, initial evaluation was on January 30, 2018.  Reviewed and summarized the referring note, she has history of hypertension, allergy  She had a history of vertigo in the past, frequent occurrence around 2014, I saw her in the past for similar complaints, last visit was in May 2015.  She began to have intermittent episodic vertigo since 2010, CT of the brain without contrast was normal, she was seen by hearing clinic in July 2011 at the [facility], chronic testing revealed 30% unilateral weakness on the left side, consistent with static left vestibulopathy, there is some change of her blood pressure medication, which has helped her vertigo.  Per record, she had one episode of flareup in September 2014.  She was doing very well until November 2016 2018, woke up from sleep, turning towards the left side, she experienced sudden onset of vertigo, improved after she sits still lying on the right side, she denied tinnitus, no hearing loss, no starring of the speech, no lateralized motor or sensory deficit, she does has mild unsteady gait, veer towards the right side during intense vertigo,  She was given meclizine, which has helped her symptoms, over the past few months, she continued to improve steadily, but with sudden body movement, or sudden turning towards the left side, occasionally she has transient vertigo.   Laboratory evaluation in 2019, normal BMP with exception of mild elevated glucose 115, creatinine of 0.75,  normal TSH, hepatic functional test, A1c was 5.6, lipid profile showed elevated LDL 101, cholesterol 182   REVIEW OF SYSTEMS: Full 14 system review of systems performed and notable only for dizziness  ALLERGIES: Allergies  Allergen Reactions  . Statins     Muscle cramps    HOME MEDICATIONS: Current Outpatient Medications  Medication Sig Dispense Refill  . calcium carbonate (TUMS EX) 750 MG chewable tablet Chew 1 tablet by mouth daily.    . cholecalciferol (VITAMIN D) 1000 UNITS tablet Take 2,000 Units by mouth daily.     [name] ezetimibe (ZETIA) 10 MG tablet TAKE 1 TABLET DAILY 90 tablet 3  . fluticasone (FLONASE) 50 MCG/ACT nasal spray USE 2 SPRAYS IN EACH NOSTRIL DAILY 48 g 0  . lisinopril-hydrochlorothiazide (PRINZIDE,ZESTORETIC) 10-12.5 MG tablet TAKE 1 TABLET DAILY 90 tablet 3  . LIVALO 1 MG TABS TAKE 1 TABLET DAILY AFTER DINNER 90 tablet 3  . loratadine (CLARITIN) 10 MG tablet Take 1 tablet (10 mg total) by mouth daily. 30 tablet 11  . Polyethyl Glycol-Propyl Glycol (SYSTANE ULTRA OP) Apply 2 drops to eye daily.    [name] SPIRIVA RESPIMAT 2.5 MCG/ACT AERS USE 2 INHALATIONS DAILY 12 g 1  . Travoprost, BAK Free, (TRAVATAN) 0.004 % SOLN ophthalmic solution Place 1 drop into both eyes at bedtime.     Current Facility-Administered Medications  Medication Dose Route Frequency Provider Last Rate Last Dose  . 0.9 %  sodium chloride infusion  500 mL Intravenous Continuous Nandigam, Venia Minks, MD        PAST MEDICAL HISTORY: Past Medical History:  Diagnosis Date  .  Arthritis   . Dyslipidemia    statin intolerance  . Hypertension   . Vertigo     PAST SURGICAL HISTORY: Past Surgical History:  Procedure Laterality Date  . ABDOMINAL HYSTERECTOMY  1972   cervical cancer  . COLONOSCOPY    . WISDOM TOOTH EXTRACTION     uppers    FAMILY HISTORY: Family History  Problem Relation Age of Onset  . Hypertension Mother        dx in her 63's  . Heart attack Mother 81  . Breast cancer  Mother   . Heart attack Brother 91  . Diabetes Brother   . Hyperlipidemia Brother   . Hypertension Brother   . Heart disease Brother   . Colon cancer Neg Hx   . Esophageal cancer Neg Hx   . Rectal cancer Neg Hx   . Stomach cancer Neg Hx     SOCIAL HISTORY:  Social History   Socioeconomic History  . Marital status: Married    Spouse name: [name]  . Number of children: 0  . Years of education: 25  . Highest education level: Not on file  Social Needs  . Financial resource strain: Not on file  . Food insecurity - worry: Not on file  . Food insecurity - inability: Not on file  . Transportation needs - medical: Not on file  . Transportation needs - non-medical: Not on file  Occupational History    Employer: RETIRED    Comment: Retired  Tobacco Use  . Smoking status: Former Smoker    Packs/day: 0.50    Years: 32.00    Pack years: 16.00    Types: Cigarettes    Last attempt to quit: 11/27/2002    Years since quitting: 15.1  . Smokeless tobacco: Never Used  Substance and Sexual Activity  . Alcohol use: Yes    Alcohol/week: 1.8 oz    Types: 3 Standard drinks or equivalent per week    Comment: social - 2 or 3 times a month 1-2 glasses of wine  . Drug use: No  . Sexual activity: Yes    Partners: Male  Other Topics Concern  . Not on file  Social History Narrative   Exercise 3 to 4 times/week walking for 45 min -1 hour   Patient lives at home with her husband Legrand Como).   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   Right handed.   decaffeine Green Tea.     PHYSICAL EXAM   Vitals:   01/30/18 1021  BP: (!) 144/87  Pulse: 81  Weight: 149 lb (67.6 kg)  Height: 5' 3.5" (1.613 m)    Not recorded      Body mass index is 25.98 kg/m.  PHYSICAL EXAMNIATION:  Gen: NAD, conversant, well nourised, obese, well groomed                     Cardiovascular: Regular rate rhythm, no  peripheral edema, warm, nontender. Eyes: Conjunctivae clear without exudates or hemorrhage Neck: Supple, no carotid bruits. Pulmonary: Clear to auscultation bilaterally   NEUROLOGICAL EXAM:  MENTAL STATUS: Speech:    Speech is normal; fluent and spontaneous with normal comprehension.  Cognition:     Orientation to time, place and person     Normal recent and remote memory     Normal Attention span and concentration     Normal Language, naming, repeating,spontaneous speech  Fund of knowledge   CRANIAL NERVES: CN II: Visual fields are full to confrontation. Fundoscopic exam is normal with sharp discs and no vascular changes. Pupils are round equal and briskly reactive to light. CN III, IV, VI: extraocular movement are normal. No ptosis. CN V: Facial sensation is intact to pinprick in all 3 divisions bilaterally. Corneal responses are intact.  CN VII: Face is symmetric with normal eye closure and smile. CN VIII: Hearing is normal to rubbing fingers CN IX, X: Palate elevates symmetrically. Phonation is normal. CN XI: Head turning and shoulder shrug are intact CN XII: Tongue is midline with normal movements and no atrophy.  MOTOR: There is no pronator drift of out-stretched arms. Muscle bulk and tone are normal. Muscle strength is normal.  REFLEXES: Reflexes are 2+ and symmetric at the biceps, triceps, knees, and ankles. Plantar responses are flexor.  SENSORY: Intact to light touch, pinprick, positional sensation and vibratory sensation are intact in fingers and toes.  COORDINATION: Rapid alternating movements and fine finger movements are intact. There is no dysmetria on finger-to-nose and heel-knee-shin.    GAIT/STANCE: Posture is normal. Gait is steady with normal steps, base, arm swing, and turning. Heel and toe walking are normal. Tandem gait is normal.  Romberg is absent.  Rapid head rotating movement did not trigger vertigo or difficulty focusing  DIAGNOSTIC DATA  (LABS, IMAGING, TESTING) - I reviewed patient records, labs, notes, testing and imaging myself where available.   ASSESSMENT AND PLAN  ADELISE BUSWELL is a 67 y.o. female    Recurrent episode 12 acute onset of vertigo  Evidence of left vestibulopathy based on vestibular testing in the past,  MRI of the brain with and without contrast, with emphasize on left internal acoustic canal    Marcial Pacas, M.D. Ph.D.  [facility] [address] [place], [place] [address] Ph: [phone] Fax: [phone]  CC: Wendie Agreste, MD

## 2018-02-05 DIAGNOSIS — H6123 Impacted cerumen, bilateral: Secondary | ICD-10-CM | POA: Insufficient documentation

## 2018-02-16 ENCOUNTER — Ambulatory Visit
Admission: RE | Admit: 2018-02-16 | Discharge: 2018-02-16 | Disposition: A | Discharge status: Home / Self Care | Payer: Medicare Other | Source: Ambulatory Visit | Attending: Neurology | Admitting: Neurology

## 2018-02-16 DIAGNOSIS — H81399 Other peripheral vertigo, unspecified ear: Secondary | ICD-10-CM

## 2018-02-16 MED ORDER — GADOBENATE DIMEGLUMINE 529 MG/ML IV SOLN
14.0000 mL | Freq: Once | INTRAVENOUS | Status: AC | PRN
  Administered 2018-02-16: 14 mL via INTRAVENOUS

## 2018-02-18 ENCOUNTER — Telehealth: Payer: Self-pay | Admitting: Neurology

## 2018-02-18 DIAGNOSIS — J329 Chronic sinusitis, unspecified: Secondary | ICD-10-CM

## 2018-02-18 NOTE — Telephone Encounter (Addendum)
Spoke to patient - she is aware of MRI results and is agreeable to ENT referral.  States her dizziness is much better and only occurs occasionally.  She would like to call if she feels she needs to follow up due to continued symptoms.  ENT referral placed in Epic.

## 2018-02-18 NOTE — Telephone Encounter (Signed)
Please call patient MRI brain w/wo showed chronic supratentorial small vessel disease,  Evidence of severe chronic sphenoid and ethmoid sinusitis, right sphenoid is almost opacified with inflammation,  She would benefit ENT evaluation  Also check her dizziness, if she remains symptomatic, may give her a follow-up visit to review MRIs   IMPRESSION:  This MRI of the brain with and without contrast with added attention to the internal auditory canals shows the following: 1.    Chronic microvascular ischemic change, more than typical for age. 2.    Chronic sphenoid and ethmoid sinusitis.  The right hemi-sphenoid is almost opacified with inflammation.   3.    The 7th/8th nerve complexes appear normal. 4.    There is a normal enhancement pattern and there are no acute findings.

## 2018-02-18 NOTE — Addendum Note (Signed)
Addended by: Desmond Lope on: 02/18/2018 02:35 PM   Modules accepted: Orders

## 2018-03-01 DIAGNOSIS — J0130 Acute sphenoidal sinusitis, unspecified: Secondary | ICD-10-CM | POA: Diagnosis not present

## 2018-03-01 DIAGNOSIS — J329 Chronic sinusitis, unspecified: Secondary | ICD-10-CM | POA: Diagnosis not present

## 2018-03-01 DIAGNOSIS — H8112 Benign paroxysmal vertigo, left ear: Secondary | ICD-10-CM | POA: Diagnosis not present

## 2018-03-06 ENCOUNTER — Encounter: Payer: Self-pay | Admitting: Physician Assistant

## 2018-03-06 DIAGNOSIS — R42 Dizziness and giddiness: Secondary | ICD-10-CM | POA: Diagnosis not present

## 2018-03-06 DIAGNOSIS — H8112 Benign paroxysmal vertigo, left ear: Secondary | ICD-10-CM | POA: Diagnosis not present

## 2018-03-27 DEATH — deceased

## 2018-04-11 ENCOUNTER — Other Ambulatory Visit: Payer: Self-pay | Admitting: Pulmonary Disease

## 2018-04-11 ENCOUNTER — Other Ambulatory Visit: Payer: Self-pay | Admitting: Family Medicine

## 2018-04-11 DIAGNOSIS — J301 Allergic rhinitis due to pollen: Secondary | ICD-10-CM

## 2018-06-04 DIAGNOSIS — H02831 Dermatochalasis of right upper eyelid: Secondary | ICD-10-CM | POA: Diagnosis not present

## 2018-06-04 DIAGNOSIS — H25013 Cortical age-related cataract, bilateral: Secondary | ICD-10-CM | POA: Diagnosis not present

## 2018-06-04 DIAGNOSIS — H18413 Arcus senilis, bilateral: Secondary | ICD-10-CM | POA: Diagnosis not present

## 2018-06-04 DIAGNOSIS — H25043 Posterior subcapsular polar age-related cataract, bilateral: Secondary | ICD-10-CM | POA: Diagnosis not present

## 2018-06-04 DIAGNOSIS — H02834 Dermatochalasis of left upper eyelid: Secondary | ICD-10-CM | POA: Diagnosis not present

## 2018-06-04 DIAGNOSIS — H401131 Primary open-angle glaucoma, bilateral, mild stage: Secondary | ICD-10-CM | POA: Diagnosis not present

## 2018-06-04 DIAGNOSIS — I10 Essential (primary) hypertension: Secondary | ICD-10-CM | POA: Diagnosis not present

## 2018-06-04 DIAGNOSIS — H2513 Age-related nuclear cataract, bilateral: Secondary | ICD-10-CM | POA: Diagnosis not present

## 2018-06-04 DIAGNOSIS — H43813 Vitreous degeneration, bilateral: Secondary | ICD-10-CM | POA: Diagnosis not present

## 2018-07-15 ENCOUNTER — Ambulatory Visit (INDEPENDENT_AMBULATORY_CARE_PROVIDER_SITE_OTHER): Payer: Medicare Other | Admitting: Family Medicine

## 2018-07-15 ENCOUNTER — Encounter: Payer: Self-pay | Admitting: Family Medicine

## 2018-07-15 ENCOUNTER — Other Ambulatory Visit: Payer: Self-pay

## 2018-07-15 VITALS — BP 140/70 | HR 81 | Temp 97.7°F | Ht 63.0 in | Wt 152.6 lb

## 2018-07-15 DIAGNOSIS — E785 Hyperlipidemia, unspecified: Secondary | ICD-10-CM

## 2018-07-15 DIAGNOSIS — M79644 Pain in right finger(s): Secondary | ICD-10-CM | POA: Diagnosis not present

## 2018-07-15 DIAGNOSIS — J301 Allergic rhinitis due to pollen: Secondary | ICD-10-CM

## 2018-07-15 DIAGNOSIS — M79645 Pain in left finger(s): Secondary | ICD-10-CM | POA: Diagnosis not present

## 2018-07-15 DIAGNOSIS — M6530 Trigger finger, unspecified finger: Secondary | ICD-10-CM | POA: Diagnosis not present

## 2018-07-15 DIAGNOSIS — I10 Essential (primary) hypertension: Secondary | ICD-10-CM

## 2018-07-15 MED ORDER — FLUTICASONE PROPIONATE 50 MCG/ACT NA SUSP: 2.0000 | Freq: Every day | NASAL | 6 refills | Status: DC

## 2018-07-15 MED ORDER — LISINOPRIL-HYDROCHLOROTHIAZIDE 10-12.5 MG PO TABS: ORAL_TABLET | ORAL | 3 refills | Status: DC

## 2018-07-15 NOTE — Patient Instructions (Addendum)
No change in meds for now. Keep a record of your blood pressures outside of the office andf remaining over 140/90 - return for med changes. Recheck for physical as planned.   Please return for fasting labs in next 1 week.   Trigger Finger Trigger finger (stenosing tenosynovitis) is a condition that causes a finger to get stuck in a bent position. Each finger has a tough, cord-like tissue that connects muscle to bone (tendon), and each tendon is surrounded by a tunnel of tissue (tendon sheath). To move your finger, your tendon needs to slide freely through the sheath. Trigger finger happens when the tendon or the sheath thickens, making it difficult to move your finger. Trigger finger can affect any finger or a thumb. It may affect more than one finger. Mild cases may clear up with rest and medicine. Severe cases require more treatment. What are the causes? Trigger finger is caused by a thickened finger tendon or tendon sheath. The cause of this thickening is not known. What increases the risk? The following factors may make you more likely to develop this condition:  Doing activities that require a strong grip.  Having rheumatoid arthritis, gout, or diabetes.  Being 48-57 years old.  Being a woman.  What are the signs or symptoms? Symptoms of this condition include:  Pain when bending or straightening your finger.  Tenderness or swelling where your finger attaches to the palm of your hand.  A lump in the palm of your hand or on the inside of your finger.  Hearing a popping sound when you try to straighten your finger.  Feeling a popping, catching, or locking sensation when you try to straighten your finger.  Being unable to straighten your finger.  How is this diagnosed? This condition is diagnosed based on your symptoms and a physical exam. How is this treated? This condition may be treated by:  Resting your finger and avoiding activities that make symptoms worse.  Wearing  a finger splint to keep your finger in a slightly bent position.  Taking NSAIDs to relieve pain and swelling.  Injecting medicine (steroids) into the tendon sheath to reduce swelling and irritation. Injections may need to be repeated.  Having surgery to open the tendon sheath. This may be done if other treatments do not work and you cannot straighten your finger. You may need physical therapy after surgery.  Follow these instructions at home:  Use moist heat to help reduce pain and swelling as told by your health care provider.  Rest your finger and avoid activities that make pain worse. Return to normal activities as told by your health care provider.  If you have a splint, wear it as told by your health care provider.  Take over-the-counter and prescription medicines only as told by your health care provider.  Keep all follow-up visits as told by your health care provider. This is important. Contact a health care provider if:  Your symptoms are not improving with home care. Summary  Trigger finger (stenosing tenosynovitis) causes your finger to get stuck in a bent position, and it can make it difficult and painful to straighten your finger.  This condition develops when a finger tendon or tendon sheath thickens.  Treatment starts with resting, wearing a splint, and taking NSAIDs.  In severe cases, surgery to open the tendon sheath may be needed. This information is not intended to replace advice given to you by your health care provider. Make sure you discuss any questions you have  with your health care provider. Document Released: 09/02/2004 Document Revised: 10/24/2016 Document Reviewed: 10/24/2016 Elsevier Interactive Patient Education  [employer].       If you have lab work done today you will be contacted with your lab results within the next 2 weeks.  If you have not heard from [place] then please contact us. The fastest way to get your results is to register for My  Chart.   IF you received an x-ray today, you will receive an invoice from [facility]. Please contact [facility] at [phone] with questions or concerns regarding your invoice.   IF you received labwork today, you will receive an invoice from [place]. Please contact [facility] at [phone] with questions or concerns regarding your invoice.   Our billing staff will not be able to assist you with questions regarding bills from these companies.  You will be contacted with the lab results as soon as they are available. The fastest way to get your results is to activate your My Chart account. Instructions are located on the last page of this paperwork. If you have not heard from [place] regarding the results in 2 weeks, please contact this office.

## 2018-07-15 NOTE — Progress Notes (Signed)
Subjective:    Patient ID: Breanna Sharp, female    DOB: [birth year], 67 y.o.   MRN: [id number]  HPI Breanna Sharp is a 67 y.o. female Presents today for: Chief Complaint  Patient presents with  . chronic Conditions    BP f/u 6 month  . Hand Pain    ring finger on right hand and thumb on left had are having pain     Hypertension: BP Readings from Last 3 Encounters:  07/15/18 140/70  01/30/18 (!) 144/87  01/14/18 124/62   Lab Results  Component Value Date   CREATININE 0.75 12/03/2017  rare home readings. No new side effects with meds. walking for exercise. Less on vacation. Variable readings last visit, but we decided to remain on same regimen. Discussed sodium intake last visit, did eat out a lot recently on vacation.  On lisinopril hct 10/12.5mg , no new side effects.   Hyperlipidemia:  Lab Results  Component Value Date   CHOL 182 01/16/2018   HDL 57 01/16/2018   LDLCALC 101 (H) 01/16/2018   TRIG 119 01/16/2018   CHOLHDL 3.2 01/16/2018   Lab Results  Component Value Date   ALT 16 01/16/2018   AST 14 01/16/2018   ALKPHOS 70 01/16/2018   BILITOT 0.5 01/16/2018   on livalo and zetia.  Intolerant to statins. Cardiologist  - Dr. Debara Pickett once per year. Tolerating current meds. Not fasting today - will return for bloodwork.   COPD: Followed by Dr. Vaughan Browner. appt once per year. On Spiriva. Stable, with controlled symptoms  Allergies, takes flonase and allegra. Doing ok.   Hand pain: Ring finger on R hand. Sore for about 2 months, bending better recently, but some popping. Occasional popping of left thumb and trouble bending same amount of time. R hand dominant, no new activity, NKI.   Tx: tylenol for hip.   Patient Active Problem List   Diagnosis Date Noted  . Epidemic vertigo 01/30/2018  . COPD, group A, by GOLD 2017 classification ([place]) 06/06/2017  . Dyslipidemia 05/11/2016  . Shortness of breath 05/11/2016  . Essential hypertension 05/11/2016  . Osteopenia  10/12/2015  . DOE (dyspnea on exertion) 05/02/2013  . Chronic sinusitis 05/02/2013  . Vertigo 08/14/2012  . Benign essential HTN 08/14/2012  . BMI 28.0-28.9,adult 08/14/2012  . COLONIC POLYPS 01/24/2008  . ACUT GASTR ULCER W/O MENTION HEMORR PERF/OBST 01/24/2008   Past Medical History:  Diagnosis Date  . Arthritis   . Dyslipidemia    statin intolerance  . Hypertension   . Vertigo    Past Surgical History:  Procedure Laterality Date  . ABDOMINAL HYSTERECTOMY  1972   cervical cancer  . COLONOSCOPY    . WISDOM TOOTH EXTRACTION     uppers   Allergies  Allergen Reactions  . Statins     Muscle cramps   Prior to Admission medications   Medication Sig Start Date End Date Taking? Authorizing Provider  calcium carbonate (TUMS EX) 750 MG chewable tablet Chew 1 tablet by mouth daily.   Yes [provider]  cholecalciferol (VITAMIN D) 1000 UNITS tablet Take 2,000 Units by mouth daily.    Yes [provider]  ezetimibe (ZETIA) 10 MG tablet TAKE 1 TABLET DAILY 08/01/17  Yes Hilty, Nadean Corwin, MD  fexofenadine (ALLEGRA) 180 MG tablet Take by mouth. 10/04/13  Yes [provider]  fluticasone (FLONASE) 50 MCG/ACT nasal spray USE 2 SPRAYS IN EACH NOSTRIL DAILY 04/11/18  Yes Wendie Agreste, MD  lisinopril-hydrochlorothiazide [facility]) 10-12.5  MG tablet TAKE 1 TABLET DAILY 09/04/17  Yes Wendie Agreste, MD  LIVALO 1 MG TABS TAKE 1 TABLET DAILY AFTER DINNER 09/03/17  Yes Hilty, Nadean Corwin, MD  Polyethyl Glycol-Propyl Glycol (SYSTANE ULTRA OP) Apply 2 drops to eye daily.   Yes [provider]  SPIRIVA RESPIMAT 2.5 MCG/ACT AERS USE 2 INHALATIONS DAILY 04/11/18  Yes Mannam, Praveen, MD  Travoprost, BAK Free, (TRAVATAN) 0.004 % SOLN ophthalmic solution Place 1 drop into both eyes at bedtime.   Yes [provider]   Social History   Socioeconomic History  . Marital status: Married    Spouse name: Breanna Sharp  . Number of children: 0  . Years of  education: 30  . Highest education level: Not on file  Occupational History    Employer: RETIRED    Comment: Retired  Scientific laboratory technician  . Financial resource strain: Not on file  . Food insecurity:    Worry: Not on file    Inability: Not on file  . Transportation needs:    Medical: Not on file    Non-medical: Not on file  Tobacco Use  . Smoking status: Former Smoker    Packs/day: 0.50    Years: 32.00    Pack years: 16.00    Types: Cigarettes    Last attempt to quit: 11/27/2002    Years since quitting: 15.6  . Smokeless tobacco: Never Used  Substance and Sexual Activity  . Alcohol use: Yes    Alcohol/week: 3.0 standard drinks    Types: 3 Standard drinks or equivalent per week    Comment: social - 2 or 3 times a month 1-2 glasses of wine  . Drug use: No  . Sexual activity: Yes    Partners: Male  Lifestyle  . Physical activity:    Days per week: Not on file    Minutes per session: Not on file  . Stress: Not on file  Relationships  . Social connections:    Talks on phone: Not on file    Gets together: Not on file    Attends religious service: Not on file    Active member of club or organization: Not on file    Attends meetings of clubs or organizations: Not on file    Relationship status: Not on file  . Intimate partner violence:    Fear of current or ex partner: Not on file    Emotionally abused: Not on file    Physically abused: Not on file    Forced sexual activity: Not on file  Other Topics Concern  . Not on file  Social History Narrative   Exercise 3 to 4 times/week walking for 45 min -1 hour   Patient lives at home with her husband Legrand Como).   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   Right handed.   decaffeine Green Tea.     Review of Systems  Constitutional: Negative for fatigue and unexpected weight change.  Respiratory: Negative for chest tightness and shortness of  breath.   Cardiovascular: Negative for chest pain, palpitations and leg swelling.  Gastrointestinal: Negative for abdominal pain and blood in stool.  Neurological: Negative for dizziness, syncope, light-headedness and headaches.       Objective:   Physical Exam  Constitutional: She is oriented to person, place, and time. She appears well-developed and well-nourished.  HENT:  Head: Normocephalic and atraumatic.  Eyes: Pupils are equal,  round, and reactive to light. Conjunctivae and EOM are normal.  Neck: Carotid bruit is not present.  Cardiovascular: Normal rate, regular rhythm, normal heart sounds and intact distal pulses.  Pulmonary/Chest: Effort normal and breath sounds normal.  Abdominal: Soft. She exhibits no pulsatile midline mass. There is no tenderness.  Musculoskeletal: She exhibits no edema or deformity.  Decreased IP flexion of the left thumb with active range of motion - passive range of motion slight flexion.  No apparent triggering.  Ring finger on the right with slight decreased flexion at the MCP, otherwise full range of motion without apparent triggering, but somewhat guarded on exam neurovascular intact distally.  Neurological: She is alert and oriented to person, place, and time.  Skin: Skin is warm and dry.  Psychiatric: She has a normal mood and affect. Her behavior is normal.  Vitals reviewed.  Vitals:   07/15/18 1342 07/15/18 1344  BP: (!) 150/79 140/70  Pulse: 81   Temp: 97.7 F (36.5 C)   TempSrc: Oral   SpO2: 96%   Weight: 152 lb 9.6 oz (69.2 kg)   Height: 5\' 3"  (1.6 m)       Assessment & Plan:   SURY WENTWORTH is a 67 y.o. female Essential hypertension - Plan: lisinopril-hydrochlorothiazide (PRINZIDE,ZESTORETIC) 10-12.5 MG tablet  -  Stable but borderline., tolerating current regimen. Medications refilled. Labs pending as above.  Monitor home readings RTC if elevated.  Fasting labs next week.  Plan on physical next visit  Hyperlipidemia,  unspecified hyperlipidemia type - Plan: Comprehensive metabolic panel, Lipid panel  -Intolerant to statins, tolerating current regimen with Livalo and Zetia.  Labs pending  Allergic rhinitis due to pollen - Plan: fluticasone (FLONASE) 50 MCG/ACT nasal spray  -Stable, continue Flonase  Trigger finger, acquired - Plan: Ambulatory referral to Hand Surgery Pain in finger of both hands  -Suspected trigger finger, variable symptoms.  Guarded exam, does have some decreased range of motion as above.  No apparent triggering on exam.  Will refer to hand surgeon for further evaluation  Meds ordered this encounter  Medications  . lisinopril-hydrochlorothiazide (PRINZIDE,ZESTORETIC) 10-12.5 MG tablet    Sig: TAKE 1 TABLET DAILY    Dispense:  90 tablet    Refill:  3  . fluticasone (FLONASE) 50 MCG/ACT nasal spray    Sig: Place 2 sprays into both nostrils daily.    Dispense:  48 g    Refill:  6   Patient Instructions   No change in meds for now. Keep a record of your blood pressures outside of the office andf remaining over 140/90 - return for med changes. Recheck for physical as planned.   Please return for fasting labs in next 1 week.   Trigger Finger Trigger finger (stenosing tenosynovitis) is a condition that causes a finger to get stuck in a bent position. Each finger has a tough, cord-like tissue that connects muscle to bone (tendon), and each tendon is surrounded by a tunnel of tissue (tendon sheath). To move your finger, your tendon needs to slide freely through the sheath. Trigger finger happens when the tendon or the sheath thickens, making it difficult to move your finger. Trigger finger can affect any finger or a thumb. It may affect more than one finger. Mild cases may clear up with rest and medicine. Severe cases require more treatment. What are the causes? Trigger finger is caused by a thickened finger tendon or tendon sheath. The cause of this thickening is not known. What increases  the  risk? The following factors may make you more likely to develop this condition:  Doing activities that require a strong grip.  Having rheumatoid arthritis, gout, or diabetes.  Being 58-34 years old.  Being a woman.  What are the signs or symptoms? Symptoms of this condition include:  Pain when bending or straightening your finger.  Tenderness or swelling where your finger attaches to the palm of your hand.  A lump in the palm of your hand or on the inside of your finger.  Hearing a popping sound when you try to straighten your finger.  Feeling a popping, catching, or locking sensation when you try to straighten your finger.  Being unable to straighten your finger.  How is this diagnosed? This condition is diagnosed based on your symptoms and a physical exam. How is this treated? This condition may be treated by:  Resting your finger and avoiding activities that make symptoms worse.  Wearing a finger splint to keep your finger in a slightly bent position.  Taking NSAIDs to relieve pain and swelling.  Injecting medicine (steroids) into the tendon sheath to reduce swelling and irritation. Injections may need to be repeated.  Having surgery to open the tendon sheath. This may be done if other treatments do not work and you cannot straighten your finger. You may need physical therapy after surgery.  Follow these instructions at home:  Use moist heat to help reduce pain and swelling as told by your health care provider.  Rest your finger and avoid activities that make pain worse. Return to normal activities as told by your health care provider.  If you have a splint, wear it as told by your health care provider.  Take over-the-counter and prescription medicines only as told by your health care provider.  Keep all follow-up visits as told by your health care provider. This is important. Contact a health care provider if:  Your symptoms are not improving with home  care. Summary  Trigger finger (stenosing tenosynovitis) causes your finger to get stuck in a bent position, and it can make it difficult and painful to straighten your finger.  This condition develops when a finger tendon or tendon sheath thickens.  Treatment starts with resting, wearing a splint, and taking NSAIDs.  In severe cases, surgery to open the tendon sheath may be needed. This information is not intended to replace advice given to you by your health care provider. Make sure you discuss any questions you have with your health care provider. Document Released: 09/02/2004 Document Revised: 10/24/2016 Document Reviewed: 10/24/2016 Elsevier Interactive Patient Education  [employer].       If you have lab work done today you will be contacted with your lab results within the next 2 weeks.  If you have not heard from [place] then please contact us. The fastest way to get your results is to register for My Chart.   IF you received an x-ray today, you will receive an invoice from [facility]. Please contact [facility] at [phone] with questions or concerns regarding your invoice.   IF you received labwork today, you will receive an invoice from [place]. Please contact [facility] at [phone] with questions or concerns regarding your invoice.   Our billing staff will not be able to assist you with questions regarding bills from these companies.  You will be contacted with the lab results as soon as they are available. The fastest way to get your results is to activate your My Chart account.  Instructions are located on the last page of this paperwork. If you have not heard from [place] regarding the results in 2 weeks, please contact this office.       Signed,   [name], MD [facility] [place].  07/17/18 3:31 PM

## 2018-07-16 ENCOUNTER — Other Ambulatory Visit: Payer: Self-pay | Admitting: Family Medicine

## 2018-07-16 DIAGNOSIS — Z1231 Encounter for screening mammogram for malignant neoplasm of breast: Secondary | ICD-10-CM

## 2018-07-17 ENCOUNTER — Ambulatory Visit (INDEPENDENT_AMBULATORY_CARE_PROVIDER_SITE_OTHER): Payer: Medicare Other | Admitting: Physician Assistant

## 2018-07-17 DIAGNOSIS — E785 Hyperlipidemia, unspecified: Secondary | ICD-10-CM | POA: Diagnosis not present

## 2018-07-18 LAB — COMPREHENSIVE METABOLIC PANEL
ALT: 16 IU/L (ref 0–32)
AST: 17 IU/L (ref 0–40)
Albumin/Globulin Ratio: 1.7 (ref 1.2–2.2)
Albumin: 4.3 g/dL (ref 3.6–4.8)
Alkaline Phosphatase: 70 IU/L (ref 39–117)
BUN/Creatinine Ratio: 18 (ref 12–28)
BUN: 14 mg/dL (ref 8–27)
Bilirubin Total: 0.5 mg/dL (ref 0.0–1.2)
CO2: 25 mmol/L (ref 20–29)
Calcium: 10.2 mg/dL (ref 8.7–10.3)
Chloride: 104 mmol/L (ref 96–106)
Creatinine, Ser: 0.79 mg/dL (ref 0.57–1.00)
GFR calc Af Amer: 89 mL/min/{1.73_m2} (ref >59)
GFR calc non Af Amer: 77 mL/min/{1.73_m2} (ref >59)
Globulin, Total: 2.5 g/dL (ref 1.5–4.5)
Glucose: 101 mg/dL — ABNORMAL HIGH (ref 65–99)
Potassium: 4.2 mmol/L (ref 3.5–5.2)
Sodium: 144 mmol/L (ref 134–144)
Total Protein: 6.8 g/dL (ref 6.0–8.5)

## 2018-07-18 LAB — LIPID PANEL
Chol/HDL Ratio: 2.9 ratio (ref 0.0–4.4)
Cholesterol, Total: 167 mg/dL (ref 100–199)
HDL: 57 mg/dL (ref >39)
LDL Calculated: 89 mg/dL (ref 0–99)
Triglycerides: 107 mg/dL (ref 0–149)
VLDL Cholesterol Cal: 21 mg/dL (ref 5–40)

## 2018-07-19 ENCOUNTER — Ambulatory Visit (INDEPENDENT_AMBULATORY_CARE_PROVIDER_SITE_OTHER): Payer: Medicare Other | Admitting: Orthopaedic Surgery

## 2018-07-19 ENCOUNTER — Ambulatory Visit (INDEPENDENT_AMBULATORY_CARE_PROVIDER_SITE_OTHER): Payer: Medicare Other

## 2018-07-19 ENCOUNTER — Ambulatory Visit (INDEPENDENT_AMBULATORY_CARE_PROVIDER_SITE_OTHER): Payer: Self-pay

## 2018-07-19 ENCOUNTER — Encounter (INDEPENDENT_AMBULATORY_CARE_PROVIDER_SITE_OTHER): Payer: Self-pay | Admitting: Orthopaedic Surgery

## 2018-07-19 VITALS — BP 148/88 | HR 92 | Ht 63.0 in | Wt 152.0 lb

## 2018-07-19 DIAGNOSIS — M79641 Pain in right hand: Secondary | ICD-10-CM

## 2018-07-19 DIAGNOSIS — M79642 Pain in left hand: Secondary | ICD-10-CM

## 2018-07-19 MED ORDER — METHYLPREDNISOLONE ACETATE 40 MG/ML IJ SUSP
20.0000 mg | INTRAMUSCULAR | Status: AC | PRN
  Administered 2018-07-19: 20 mg

## 2018-07-19 MED ORDER — LIDOCAINE HCL 1 % IJ SOLN
1.0000 mL | INTRAMUSCULAR | Status: AC | PRN
  Administered 2018-07-19: 1 mL

## 2018-07-19 NOTE — Progress Notes (Signed)
Office Visit Note   Patient: Breanna Sharp           Date of Birth: 11-11-1951           MRN: [id number] Visit Date: 07/19/2018              Requested by: Wendie Agreste, MD [address] [place], [place] [address] PCP: Wendie Agreste, MD   Assessment & Plan: Visit Diagnoses:  1. Pain in left hand   2. Pain in right hand     Plan: Right ring and left thumb trigger fingers.  Long discussion over 45 minutes regarding all the above.  Breanna Sharp would like to proceed with surgery to the right hand as it is her dominant extremity and injection to the left thumb trigger finger.  This was performed without difficulty.  Considerable relief of pain to the left thumb  Follow-Up Instructions: Return will schedule right ring trigger finger surgery.   Orders:  Orders Placed This Encounter  Procedures  . Hand/UE Inj: L thumb A1  . XR Hand Complete Right  . XR Hand Complete Left   No orders of the defined types were placed in this encounter.     Procedures: Hand/UE Inj: L thumb A1 for trigger finger on 07/19/2018 11:21 AM Medications: 1 mL lidocaine 1 %; 20 mg methylPREDNISolone acetate 40 MG/ML      Clinical Data: No additional findings.   Subjective: Chief Complaint  Patient presents with  . New Patient (Initial Visit)    PT HAS HX OF DDD IN C SPINE. PT HAVING HAND PAIN AND NUMBNESS. L THUMB AND R RING FINGER CATCHES AND POPS OFF AND ON FOR ABOUT 2 MO JUST GETTING WORSE  Breanna Sharp has had an issue with her right dominant hand and left thumb.  She has active triggering of both to the point of compromise.  Is been progressive over period of about 3 months.  She is reached a point where it interferes with a lot of her activities including sleep.  HPI  Review of Systems  Constitutional: Negative for fatigue and fever.  HENT: Negative for ear pain.   Eyes: Negative for pain.  Respiratory: Negative for cough.   Cardiovascular: Negative for leg swelling.    Gastrointestinal: Negative for constipation and diarrhea.  Genitourinary: Negative for difficulty urinating.  Musculoskeletal: Negative for back pain and neck pain.  Skin: Negative for rash.  Allergic/Immunologic: Negative for food allergies.  Neurological: Positive for weakness and numbness.  Hematological: Does not bruise/bleed easily.  Psychiatric/Behavioral: Negative for sleep disturbance.     Objective: Vital Signs: BP (!) 148/88 (BP Location: Left Arm, Patient Position: Sitting, Cuff Size: Normal)   Pulse 92   Ht 5\' 3"  (1.6 m)   Wt 152 lb (68.9 kg)   BMI 26.93 kg/m   Physical Exam  Constitutional: She is oriented to person, place, and time. She appears well-developed and well-nourished.  HENT:  Mouth/Throat: Oropharynx is clear and moist.  Eyes: Pupils are equal, round, and reactive to light. EOM are normal.  Pulmonary/Chest: Effort normal.  Neurological: She is alert and oriented to person, place, and time.  Skin: Skin is warm and dry.  Psychiatric: She has a normal mood and affect. Her behavior is normal.    Ortho Exam right hand with tenderness over a small nodule at the level of the metacarpal phalangeal joint of the ring finger.  Patient can actively trigger the finger.  No swelling.  Neurovascular exam intact.  No pain about the metacarpal phalangeal joint PIP or DIP joints. Left thumb with active triggering and a painful nodule on the palmar aspect of the fifth finger at the level of the metacarpal phalangeal joint.  No swelling.  Neurovascular exam intact.  After injecting the painful nodule she could then flex the IP joint with triggering but without pain  Specialty Comments:  No specialty comments available.  Imaging: Xr Hand Complete Left  Result Date: 07/19/2018 Films of the left hand were obtained in several projections.  There is some mild degenerative change at the base of the thumb .no acute changes  Xr Hand Complete Right  Result Date:  07/19/2018 Films of the right hand were obtained in several projections.  Patient has a ring trigger finger.  No abnormality about the ring digit    PMFS History: Patient Active Problem List   Diagnosis Date Noted  . Epidemic vertigo 01/30/2018  . COPD, group A, by GOLD 2017 classification ([place]) 06/06/2017  . Dyslipidemia 05/11/2016  . Shortness of breath 05/11/2016  . Essential hypertension 05/11/2016  . Osteopenia 10/12/2015  . DOE (dyspnea on exertion) 05/02/2013  . Chronic sinusitis 05/02/2013  . Vertigo 08/14/2012  . Benign essential HTN 08/14/2012  . BMI 28.0-28.9,adult 08/14/2012  . COLONIC POLYPS 01/24/2008  . ACUT GASTR ULCER W/O MENTION HEMORR PERF/OBST 01/24/2008   Past Medical History:  Diagnosis Date  . Arthritis   . Dyslipidemia    statin intolerance  . Hypertension   . Vertigo     Family History  Problem Relation Age of Onset  . Hypertension Mother        dx in her 71's  . Heart attack Mother 20  . Breast cancer Mother   . Heart attack Brother 1  . Diabetes Brother   . Hyperlipidemia Brother   . Hypertension Brother   . Heart disease Brother   . Colon cancer Neg Hx   . Esophageal cancer Neg Hx   . Rectal cancer Neg Hx   . Stomach cancer Neg Hx     Past Surgical History:  Procedure Laterality Date  . ABDOMINAL HYSTERECTOMY  1972   cervical cancer  . COLONOSCOPY    . WISDOM TOOTH EXTRACTION     uppers   Social History   Occupational History    Employer: RETIRED    Comment: Retired  Tobacco Use  . Smoking status: Former Smoker    Packs/day: 0.50    Years: 32.00    Pack years: 16.00    Types: Cigarettes    Last attempt to quit: 11/27/2002    Years since quitting: 15.6  . Smokeless tobacco: Never Used  Substance and Sexual Activity  . Alcohol use: Yes    Alcohol/week: 3.0 standard drinks    Types: 3 Standard drinks or equivalent per week    Comment: social - 2 or 3 times a month 1-2 glasses of wine  . Drug use: No  . Sexual activity:  Yes    Partners: Male

## 2018-07-23 ENCOUNTER — Ambulatory Visit (INDEPENDENT_AMBULATORY_CARE_PROVIDER_SITE_OTHER): Payer: Medicare Other | Admitting: Orthopaedic Surgery

## 2018-07-25 ENCOUNTER — Ambulatory Visit (INDEPENDENT_AMBULATORY_CARE_PROVIDER_SITE_OTHER): Payer: Medicare Other | Admitting: Pulmonary Disease

## 2018-07-25 ENCOUNTER — Encounter: Payer: Self-pay | Admitting: Pulmonary Disease

## 2018-07-25 ENCOUNTER — Ambulatory Visit (INDEPENDENT_AMBULATORY_CARE_PROVIDER_SITE_OTHER)
Admission: RE | Admit: 2018-07-25 | Discharge: 2018-07-25 | Disposition: A | Discharge status: Home / Self Care | Payer: Medicare Other | Source: Ambulatory Visit | Attending: Pulmonary Disease | Admitting: Pulmonary Disease

## 2018-07-25 VITALS — BP 122/78 | HR 89 | Ht 63.0 in | Wt 150.0 lb

## 2018-07-25 DIAGNOSIS — J449 Chronic obstructive pulmonary disease, unspecified: Secondary | ICD-10-CM

## 2018-07-25 DIAGNOSIS — R0602 Shortness of breath: Secondary | ICD-10-CM | POA: Diagnosis not present

## 2018-07-25 MED ORDER — TIOTROPIUM BROMIDE MONOHYDRATE 2.5 MCG/ACT IN AERS: 2.0000 | INHALATION_SPRAY | Freq: Every day | RESPIRATORY_TRACT | 1 refills | Status: DC

## 2018-07-25 NOTE — Addendum Note (Signed)
Addended by: Maryanna Shape A on: 07/25/2018 09:52 AM   Modules accepted: Orders

## 2018-07-25 NOTE — Patient Instructions (Signed)
I am glad your breathing is stable We will renew your Spiriva for 90 days with refills We will get a chest x-ray today If everything looks okay then you can follow-up with your primary care for further management and follow-up with [place] as needed Make sure you get your flu vaccine today during your annual physical with primary care Follow-up as needed.

## 2018-07-25 NOTE — Progress Notes (Addendum)
Breanna Sharp    [id number]    [birth year]  Primary Care Physician:Greene, Ranell Patrick, MD  Referring Physician: Wendie Agreste, MD [address] [place],  [address]  Chief complaint:   Follow up for COPD GOLD A  HPI: Breanna Sharp is a 67 year old with past medical history of hypertension. She is here for follow up of dyspnea on exertion for the past 2 years. His symptoms mostly occur when she is walking up an incline or climbing stairs. She usually gets winded after 1 flight of stairs. She does not have any associated wheeze, cough, sputum production. The symptoms do not occur at rest. She was evaluated with a cardiopulmonary exercise test on 04/22/13. The results showed good exercise capacity. The peak VO2 of 108% of predicted. There are no obvious pulmonary abnormalities. She had a good breathing reserve.   Interim History: Doing well with no issues.  She likes using the Spiriva which has improved her breathing Hardly needs to use her rescue inhaler.  Physical Exam: Blood pressure 126/64, pulse 83, height 5\' 4"  (1.626 m), weight 148 lb 12.8 oz (67.5 kg), SpO2 97 %. Gen:      No acute distress HEENT:  EOMI, sclera anicteric Neck:     No masses; no thyromegaly Lungs:    Clear to auscultation bilaterally; normal respiratory effort CV:         Regular rate and rhythm; no murmurs Abd:      + bowel sounds; soft, non-tender; no palpable masses, no distension Ext:    No edema; adequate peripheral perfusion Skin:      Warm and dry; no rash Neuro: alert and oriented x 3 Psych: normal mood and affect  Data Reviewed: CXR (09/01/15) No active cardiopulmonary disease. Images reviewed  PFTs 04/22/13 FVC 1.91 (73%), FEV1 1.44 (70%), F/F 75, SVC 2.05 (78%), DLCO 66%  PFTs 12/17/15 FVC 2.15 [84%), FEV1 1.70 (85%], F/F 79, TLC 93%, DLCO 68% Minimal obstruction, mild reduction in diffusion  Echo 12/29/15 LV. Normal size and function. EF 55-60 percent. No evidence of  pulmonary hypertension  Assessment:  COPD GOLD A Her PFTs do not show any obstruction by F/F criteria but there is evidence of small airway disease as shown by mid flow rate reduction and curvature of the flow volume loop. She does have extensive smoking history and has responded well to Spiriva. We'll continue the same. Get a chest x-ray today   Cardiac pulmonary stress test in 2014 noted where she exceeded her predicted. VO2 max. There is no evidence of any pulmonary impairment. The PFTs show a reduction in DLCO which corrects for alveolar volume. Echo does not show any pulmonary hypertension.  Since she is doing well with no issues she can follow-up with [place] as needed  Health maintenance Influenza 09/04/2017.  She plans to get flu vaccination at her primary care this year. Prevnar 09/01/2015 Pneumovax 09/07/2016   Plan/Recommendations: - Chest x-ray - Continue Spiriva - Follow-up as needed.  Outpatient Encounter Medications as of 07/25/2018  Medication Sig  . calcium carbonate (TUMS EX) 750 MG chewable tablet Chew 1 tablet by mouth daily.  . cholecalciferol (VITAMIN D) 1000 UNITS tablet Take 2,000 Units by mouth daily.   [name] ezetimibe (ZETIA) 10 MG tablet TAKE 1 TABLET DAILY  . fexofenadine (ALLEGRA) 180 MG tablet Take by mouth.  . fluticasone (FLONASE) 50 MCG/ACT nasal spray Place 2 sprays into both nostrils daily.  [name] lisinopril-hydrochlorothiazide (PRINZIDE,ZESTORETIC) 10-12.5 MG tablet  TAKE 1 TABLET DAILY  . LIVALO 1 MG TABS TAKE 1 TABLET DAILY AFTER DINNER  . Polyethyl Glycol-Propyl Glycol (SYSTANE ULTRA OP) Apply 2 drops to eye daily.  [name] SPIRIVA RESPIMAT 2.5 MCG/ACT AERS USE 2 INHALATIONS DAILY  . Travoprost, BAK Free, (TRAVATAN) 0.004 % SOLN ophthalmic solution Place 1 drop into both eyes at bedtime.   Facility-Administered Encounter Medications as of 07/25/2018  Medication  . 0.9 %  sodium chloride infusion    Allergies as of 07/25/2018 - Review Complete 07/25/2018    Allergen Reaction Noted  . Statins  05/15/2012    Past Medical History:  Diagnosis Date  . Arthritis   . Dyslipidemia    statin intolerance  . Hypertension   . Vertigo     Past Surgical History:  Procedure Laterality Date  . ABDOMINAL HYSTERECTOMY  1972   cervical cancer  . COLONOSCOPY    . WISDOM TOOTH EXTRACTION     uppers    Family History  Problem Relation Age of Onset  . Hypertension Mother        dx in her 25's  . Heart attack Mother 86  . Breast cancer Mother   . Heart attack Brother 53  . Diabetes Brother   . Hyperlipidemia Brother   . Hypertension Brother   . Heart disease Brother   . Colon cancer Neg Hx   . Esophageal cancer Neg Hx   . Rectal cancer Neg Hx   . Stomach cancer Neg Hx     Social History   Socioeconomic History  . Marital status: Married    Spouse name: Breanna Sharp  . Number of children: 0  . Years of education: 57  . Highest education level: Not on file  Occupational History    Employer: RETIRED    Comment: Retired  Scientific laboratory technician  . Financial resource strain: Not on file  . Food insecurity:    Worry: Not on file    Inability: Not on file  . Transportation needs:    Medical: Not on file    Non-medical: Not on file  Tobacco Use  . Smoking status: Former Smoker    Packs/day: 0.50    Years: 32.00    Pack years: 16.00    Types: Cigarettes    Last attempt to quit: 11/27/2002    Years since quitting: 15.6  . Smokeless tobacco: Never Used  Substance and Sexual Activity  . Alcohol use: Yes    Alcohol/week: 3.0 standard drinks    Types: 3 Standard drinks or equivalent per week    Comment: social - 2 or 3 times a month 1-2 glasses of wine  . Drug use: No  . Sexual activity: Yes    Partners: Male  Lifestyle  . Physical activity:    Days per week: Not on file    Minutes per session: Not on file  . Stress: Not on file  Relationships  . Social connections:    Talks on phone: Not on file    Gets together: Not on file    Attends  religious service: Not on file    Active member of club or organization: Not on file    Attends meetings of clubs or organizations: Not on file    Relationship status: Not on file  . Intimate partner violence:    Fear of current or ex partner: Not on file    Emotionally abused: Not on file    Physically abused: Not on file    Forced sexual  activity: Not on file  Other Topics Concern  . Not on file  Social History Narrative   Exercise 3 to 4 times/week walking for 45 min -1 hour   Patient lives at home with her husband Legrand Como).   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   Right handed.   decaffeine Green Tea.   Review of systems: Review of Systems  Constitutional: Negative for fever and chills.  HENT: Negative.   Eyes: Negative for blurred vision.  Respiratory: as per HPI  Cardiovascular: Negative for chest pain and palpitations.  Gastrointestinal: Negative for vomiting, diarrhea, blood per rectum. Genitourinary: Negative for dysuria, urgency, frequency and hematuria.  Musculoskeletal: Negative for myalgias, back pain and joint pain.  Skin: Negative for itching and rash.  Neurological: Negative for dizziness, tremors, focal weakness, seizures and loss of consciousness.  Endo/Heme/Allergies: Negative for environmental allergies.  Psychiatric/Behavioral: Negative for depression, suicidal ideas and hallucinations.  All other systems reviewed and are negative.  Marshell Garfinkel MD [facility] 07/25/2018, 9:20 AM  CC: Wendie Agreste, MD

## 2018-07-26 ENCOUNTER — Telehealth (INDEPENDENT_AMBULATORY_CARE_PROVIDER_SITE_OTHER): Payer: Self-pay | Admitting: Orthopaedic Surgery

## 2018-07-26 NOTE — Telephone Encounter (Signed)
Left message on voicemail providing patient with my name and direct number for scheduling right trigger finger release surgery with Dr. Durward Fortes at [facility]

## 2018-07-26 NOTE — Progress Notes (Signed)
Spoke with pt and notified of results per Dr. Mannam.  Pt verbalized understanding and denied any questions. 

## 2018-08-08 ENCOUNTER — Ambulatory Visit: Payer: Medicare Other

## 2018-08-08 DIAGNOSIS — Z23 Encounter for immunization: Secondary | ICD-10-CM | POA: Diagnosis not present

## 2018-08-09 DIAGNOSIS — H6123 Impacted cerumen, bilateral: Secondary | ICD-10-CM | POA: Diagnosis not present

## 2018-08-19 ENCOUNTER — Other Ambulatory Visit: Payer: Self-pay | Admitting: Internal Medicine

## 2018-08-27 ENCOUNTER — Ambulatory Visit (INDEPENDENT_AMBULATORY_CARE_PROVIDER_SITE_OTHER): Payer: Medicare Other | Admitting: Internal Medicine

## 2018-08-27 ENCOUNTER — Encounter: Payer: Self-pay | Admitting: Internal Medicine

## 2018-08-27 VITALS — BP 136/78 | HR 93 | Ht 63.0 in | Wt 151.0 lb

## 2018-08-27 DIAGNOSIS — J449 Chronic obstructive pulmonary disease, unspecified: Secondary | ICD-10-CM

## 2018-08-27 DIAGNOSIS — I10 Essential (primary) hypertension: Secondary | ICD-10-CM | POA: Diagnosis not present

## 2018-08-27 DIAGNOSIS — E785 Hyperlipidemia, unspecified: Secondary | ICD-10-CM

## 2018-08-27 MED ORDER — PITAVASTATIN CALCIUM 1 MG PO TABS: 1.0000 mg | ORAL_TABLET | Freq: Every day | ORAL | 3 refills | Status: DC

## 2018-08-27 NOTE — Patient Instructions (Signed)
Your physician wants you to follow-up in: ONE YEAR with Dr. Hilty. You will receive a reminder letter in the mail two months in advance. If you don't receive a letter, please call our office to schedule the follow-up appointment.  

## 2018-08-27 NOTE — Progress Notes (Signed)
Grossly normal  OFFICE NOTE  Chief Complaint:  Annual follow-up  Primary Care Physician: Wendie Agreste, MD  HPI:  Breanna Sharp is a pleasant 67 year old female with a history of hypertension and dyslipidemia. Unfortunately she's been intolerant to statins in the past and has failed both Lipitor Zocor Crestor and pravastatin. She is reported some increasing shortness of breath mostly when walking upstairs, however has been struggling with recurrent sinusitis and upper respiratory symptoms. Based on her risk factors, recommended metabolic testing. She underwent cardiopulmonary testing on 04/22/2013. She had maximal effort of 1.08 our ER. Peak VO2 was 108% predicted. Heart rate was 94% predicted. Her heart rate in view to curves were essentially normal with late flattening of her view to curve. Overall the study is low risk. She also went underwent palmar he function testing which showed normal diffusion, volume and flow loops. At her last visit she started taking Livalo samples due to an abnormal lipid profile. Her total cholesterol was 213, triglycerides 122, HDL 55 and LDL 134. Over the intial month on Livalo, she seemed to tolerate it.   Then she reported some right flank pain, especially when lifting weights, which he had started to do at the same time. She stopped her cholesterol medicine and stopped her exercise and her symptoms improved. She then restarted her exercise and had symptoms and realized that it was not likely the cholesterol medicine that was causing her right side pain. Her repeat cholesterol profile was abnormally high on only Zetia monotherapy, as she discontinued Livalo due to cramps. Her LDL particle number was 3189, LDL content was 190, HDL C. was 60 and triglycerides 148.    Breanna Sharp returns today for followup of her lipid profile. Her lipid profile in April was markedly improved with an LDL particle #1594, LDL content 105, HDL 51 and triglycerides 142. She reports  some improvement in her shortness of breath and has managed to work on exercise and has had 12 pounds of weight loss.  I saw Breanna Sharp back in the office today. Overall she is doing well except she still has some shortness of breath despite weight loss, particularly when walking up inclines. We will not been able to really establish the reason for this. Heart rate did go up to a very high level with exercise, 94% predicted on cardiopulmonary exercise testing in 2014. Is not clear whether this is an arrhythmia or just sinus tachycardia. This could explain why she feels worse when walking up hills. I like to see if we can re-create this with exercise treadmill stress testing. If there is a sharp increase in heart rate with exercise, she may benefit from a low-dose beta blocker.  05/11/2016  Breanna Sharp returns today for follow-up. She is without any significant complaints. Blood pressure was mildly elevated at 133/87 however repeat was 128/78. She is not having any problems on that area and Livalo. She is due for repeat cholesterol test. After an extensive workup we cannot find a cause from a cardiac standpoint of her shortness of breath. She was [facility] and saw Dr. Vaughan Browner who performed palmar function test and feels that she might have some COPD. She's been on Spiriva and reports improvement in her shortness of breath.  06/06/2017  Breanna Sharp was seen today in follow-up. She is overall without complaints. Denies any shortness of breath or chest pain. She reports her COPD is improved significantly. She's managed to lose some weight and exercising regularly. Blood  pressure is reasonable today 134/72.  08/27/2018  Breanna Sharp returns today for follow-up.  She is done extremely well over the past year.  Her shortness of breath has been improved on inhalers.  Her COPD is stable.  Weight is fairly stable.  She exercises regularly walking between 4 and 8 miles a day.  She  denies any chest pain.  She is also changed her eating habits and recently had a repeat lipid profile which is the best that she is ever seen.  Her total cholesterol is 167, triglycerides 107, HDL 57 LDL 89.  PMHx:  Past Medical History:  Diagnosis Date  . Arthritis   . Dyslipidemia    statin intolerance  . Hypertension   . Vertigo     Past Surgical History:  Procedure Laterality Date  . ABDOMINAL HYSTERECTOMY  1972   cervical cancer  . COLONOSCOPY    . WISDOM TOOTH EXTRACTION     uppers    FAMHx:  Family History  Problem Relation Age of Onset  . Hypertension Mother        dx in her 26's  . Heart attack Mother 78  . Breast cancer Mother   . Heart attack Brother 45  . Diabetes Brother   . Hyperlipidemia Brother   . Hypertension Brother   . Heart disease Brother   . Colon cancer Neg Hx   . Esophageal cancer Neg Hx   . Rectal cancer Neg Hx   . Stomach cancer Neg Hx     SOCHx:   reports that she quit smoking about 15 years ago. Her smoking use included cigarettes. She has a 16.00 pack-year smoking history. She has never used smokeless tobacco. She reports that she drinks about 3.0 standard drinks of alcohol per week. She reports that she does not use drugs.  ALLERGIES:  Allergies  Allergen Reactions  . Statins     Muscle cramps    ROS: Pertinent items noted in HPI and remainder of comprehensive ROS otherwise negative.  HOME MEDS: Current Outpatient Medications  Medication Sig Dispense Refill  . calcium carbonate (TUMS EX) 750 MG chewable tablet Chew 1 tablet by mouth daily.    . cholecalciferol (VITAMIN D) 1000 UNITS tablet Take 2,000 Units by mouth daily.     [name] ezetimibe (ZETIA) 10 MG tablet Take 1 tablet (10 mg total) by mouth daily. KEEP OV. 90 tablet 0  . fexofenadine (ALLEGRA) 180 MG tablet Take by mouth.    . fluticasone (FLONASE) 50 MCG/ACT nasal spray Place 2 sprays into both nostrils daily. 48 g 6  . lisinopril-hydrochlorothiazide (PRINZIDE,ZESTORETIC)  10-12.5 MG tablet TAKE 1 TABLET DAILY 90 tablet 3  . LIVALO 1 MG TABS TAKE 1 TABLET DAILY AFTER DINNER 90 tablet 3  . Polyethyl Glycol-Propyl Glycol (SYSTANE ULTRA OP) Apply 2 drops to eye daily.    . Tiotropium Bromide Monohydrate (SPIRIVA RESPIMAT) 2.5 MCG/ACT AERS Take 2 puffs by mouth daily. 12 g 1  . Travoprost, BAK Free, (TRAVATAN) 0.004 % SOLN ophthalmic solution Place 1 drop into both eyes at bedtime.     Current Facility-Administered Medications  Medication Dose Route Frequency Provider Last Rate Last Dose  . 0.9 %  sodium chloride infusion  500 mL Intravenous Continuous Nandigam, Kavitha V, MD        LABS/IMAGING: No results found for this or any previous visit (from the past 48 hour(s)). No results found.  VITALS: BP 136/78 (BP Location: Left Arm, Patient Position: Sitting, Cuff Size: Normal)  Pulse 93   Ht 5\' 3"  (1.6 m)   Wt 151 lb (68.5 kg)   BMI 26.75 kg/m   EXAM: General appearance: alert and no distress Neck: no carotid bruit and no JVD Lungs: clear to auscultation bilaterally Heart: regular rate and rhythm, S1, S2 normal, no murmur, click, rub or gallop Abdomen: soft, non-tender; bowel sounds normal; no masses,  no organomegaly Extremities: extremities normal, atraumatic, no cyanosis or edema Pulses: 2+ and symmetric Skin: Skin color, texture, turgor normal. No rashes or lesions Neurologic: GroPsych: Pleasanty normal Psych: Pleasant  EKG: Normal sinus rhythm with nonspecific T wave changes 93-personally reviewed  ASSESSMENT: 1. Dyslipidemia 2.   Hypertension 3.   COPD - Gold A  PLAN: 1.   Breanna Sharp continues to do well without worsening chest pain and stable shortness of breath related to COPD.  Blood pressure is at goal.  Her cholesterol is well controlled.  We will continue current therapy without changes in medications.  Plan follow-up annually or sooner as necessary.  Pixie Casino, MD, [facility] [place]  Director of the [facility] Lip[facility] &  [facility]ovascular [facility] Diplomate of the American Board of Clinical Lipidology Attending Cardiologist  Direct Dial: [phone]  Fax: [phone]  Website:  [email or web address]   Nadean Corwin Hilty 08/27/2018, 8:25 AM

## 2018-08-28 ENCOUNTER — Telehealth (INDEPENDENT_AMBULATORY_CARE_PROVIDER_SITE_OTHER): Payer: Self-pay | Admitting: Orthopaedic Surgery

## 2018-08-28 NOTE — Telephone Encounter (Signed)
JUST AN FYI 

## 2018-08-28 NOTE — Telephone Encounter (Signed)
Patient called stating she spoke with [facility] regarding her upcoming surgery for her finger and didn't realize the surgery would be at the hospital and that she might need anesthesia.  Patient states she thought the procedure would be done in Dr. Rudene Anda office.  Patient states she needs more information to make a decision on the surgery and scheduled an appointment on 09/03/18.

## 2018-08-28 NOTE — Telephone Encounter (Signed)
Surgery would be performed at surgical center with local anesthesia-please let her know

## 2018-08-28 NOTE — Telephone Encounter (Signed)
PLEASE ADVISE PT

## 2018-09-03 ENCOUNTER — Encounter (INDEPENDENT_AMBULATORY_CARE_PROVIDER_SITE_OTHER): Payer: Self-pay | Admitting: Orthopaedic Surgery

## 2018-09-03 ENCOUNTER — Ambulatory Visit
Admission: RE | Admit: 2018-09-03 | Discharge: 2018-09-03 | Disposition: A | Discharge status: Home / Self Care | Payer: Medicare Other | Source: Ambulatory Visit | Attending: Family Medicine | Admitting: Family Medicine

## 2018-09-03 ENCOUNTER — Ambulatory Visit (INDEPENDENT_AMBULATORY_CARE_PROVIDER_SITE_OTHER): Payer: Medicare Other | Admitting: Orthopaedic Surgery

## 2018-09-03 VITALS — BP 127/78 | HR 87 | Ht 63.5 in | Wt 148.0 lb

## 2018-09-03 DIAGNOSIS — M65341 Trigger finger, right ring finger: Secondary | ICD-10-CM

## 2018-09-03 DIAGNOSIS — Z1231 Encounter for screening mammogram for malignant neoplasm of breast: Secondary | ICD-10-CM

## 2018-09-03 MED ORDER — LIDOCAINE HCL 1 % IJ SOLN
0.3000 mL | INTRAMUSCULAR | Status: AC | PRN
  Administered 2018-09-03: .3 mL

## 2018-09-03 MED ORDER — METHYLPREDNISOLONE ACETATE 40 MG/ML IJ SUSP
13.3300 mg | INTRAMUSCULAR | Status: AC | PRN
  Administered 2018-09-03: 13.33 mg

## 2018-09-03 NOTE — Progress Notes (Signed)
Office Visit Note   Patient: Breanna Sharp           Date of Birth: May 12, 1951           MRN: [id number] Visit Date: 09/03/2018              Requested by: Wendie Agreste, MD [address] [place], [place] [address] PCP: Wendie Agreste, MD   Assessment & Plan: Visit Diagnoses:  1. Trigger finger, right ring finger     Plan: Inject right ring trigger finger with cortisone will see back as needed.  Breanna Sharp would like to put off surgery until the first of the year  Follow-Up Instructions: Return if symptoms worsen or fail to improve.   Orders:  No orders of the defined types were placed in this encounter.  No orders of the defined types were placed in this encounter.     Procedures: Hand/UE Inj: R ring A1 for trigger finger on 09/03/2018 1:48 PM Details: volar approach Medications: 0.3 mL lidocaine 1 %; 13.33 mg methylPREDNISolone acetate 40 MG/ML      Clinical Data: No additional findings.   Subjective: Chief Complaint  Patient presents with  . Follow-up    WOULD LIKE TO GET INJECTION AND DELAY SURGERY  Breanna Sharp has a left thumb trigger finger and right ring trigger finger.  I injected the left thumb last month with excellent result.  Rather than proceed with surgery at this point she would like to have a cortisone injection to the ring finger  HPI  Review of Systems   Objective: Vital Signs: BP 127/78 (BP Location: Left Arm, Patient Position: Sitting, Cuff Size: Normal)   Pulse 87   Ht 5' 3.5" (1.613 m)   Wt 148 lb (67.1 kg)   BMI 25.81 kg/m   Physical Exam  Ortho Exam awake alert and oriented x3.  Comfortable sitting.  Active triggering of the right ring finger.  Painful nodule as previously identified.  No numbness or tingling.  No swelling of the digit.  Left thumb without active triggering and without any pain  Specialty Comments:  No specialty comments available.  Imaging: No results found.   PMFS History: Patient  Active Problem List   Diagnosis Date Noted  . Epidemic vertigo 01/30/2018  . COPD, group A, by GOLD 2017 classification ([place]) 06/06/2017  . Dyslipidemia 05/11/2016  . Shortness of breath 05/11/2016  . Essential hypertension 05/11/2016  . Osteopenia 10/12/2015  . DOE (dyspnea on exertion) 05/02/2013  . Chronic sinusitis 05/02/2013  . Vertigo 08/14/2012  . Benign essential HTN 08/14/2012  . BMI 28.0-28.9,adult 08/14/2012  . COLONIC POLYPS 01/24/2008  . ACUT GASTR ULCER W/O MENTION HEMORR PERF/OBST 01/24/2008   Past Medical History:  Diagnosis Date  . Arthritis   . Dyslipidemia    statin intolerance  . Hypertension   . Vertigo     Family History  Problem Relation Age of Onset  . Hypertension Mother        dx in her 56's  . Heart attack Mother 31  . Breast cancer Mother        unsure of age  . Heart attack Brother 45  . Diabetes Brother   . Hyperlipidemia Brother   . Hypertension Brother   . Heart disease Brother   . Colon cancer Neg Hx   . Esophageal cancer Neg Hx   . Rectal cancer Neg Hx   . Stomach cancer Neg Hx     Past Surgical History:  Procedure Laterality Date  . ABDOMINAL HYSTERECTOMY  1972   cervical cancer  . COLONOSCOPY    . WISDOM TOOTH EXTRACTION     uppers   Social History   Occupational History    Employer: RETIRED    Comment: Retired  Tobacco Use  . Smoking status: Former Smoker    Packs/day: 0.50    Years: 32.00    Pack years: 16.00    Types: Cigarettes    Last attempt to quit: 11/27/2002    Years since quitting: 15.7  . Smokeless tobacco: Never Used  Substance and Sexual Activity  . Alcohol use: Yes    Alcohol/week: 3.0 standard drinks    Types: 3 Standard drinks or equivalent per week    Comment: social - 2 or 3 times a month 1-2 glasses of wine  . Drug use: No  . Sexual activity: Yes    Partners: Male     Garald Balding, MD   Note - This record has been created using [employer].  Chart creation errors have been  sought, but may not always  have been located. Such creation errors do not reflect on  the standard of medical care.

## 2018-09-03 NOTE — Progress Notes (Deleted)
Office Visit Note   Patient: Breanna Sharp           Date of Birth: [birth year]           MRN: [id number] Visit Date: 09/03/2018              Requested by: Wendie Agreste, MD [address] [place], [place] [address] PCP: Wendie Agreste, MD   Assessment & Plan: Visit Diagnoses: No diagnosis found.  Plan: ***  Follow-Up Instructions: No follow-ups on file.   Orders:  No orders of the defined types were placed in this encounter.  No orders of the defined types were placed in this encounter.     Procedures: No procedures performed   Clinical Data: No additional findings.   Subjective: Chief Complaint  Patient presents with  . Follow-up    WOULD LIKE TO GET INJECTION AND DELAY SURGERY    HPI  Review of Systems  Constitutional: Negative for fatigue and fever.  HENT: Negative for ear pain.   Eyes: Negative for pain.  Respiratory: Negative for cough and shortness of breath.   Cardiovascular: Negative for leg swelling.  Gastrointestinal: Negative for constipation and diarrhea.  Genitourinary: Negative for difficulty urinating.  Musculoskeletal: Negative for back pain and neck pain.  Skin: Negative for rash.  Allergic/Immunologic: Negative for food allergies.  Neurological: Negative for weakness and numbness.  Hematological: Does not bruise/bleed easily.  Psychiatric/Behavioral: Negative for sleep disturbance.     Objective: Vital Signs: Ht 5' 3.5" (1.613 m)   Wt 148 lb (67.1 kg)   BMI 25.81 kg/m   Physical Exam  Ortho Exam  Specialty Comments:  No specialty comments available.  Imaging: No results found.   PMFS History: Patient Active Problem List   Diagnosis Date Noted  . Epidemic vertigo 01/30/2018  . COPD, group A, by GOLD 2017 classification ([place]) 06/06/2017  . Dyslipidemia 05/11/2016  . Shortness of breath 05/11/2016  . Essential hypertension 05/11/2016  . Osteopenia 10/12/2015  . DOE (dyspnea on exertion) 05/02/2013  .  Chronic sinusitis 05/02/2013  . Vertigo 08/14/2012  . Benign essential HTN 08/14/2012  . BMI 28.0-28.9,adult 08/14/2012  . COLONIC POLYPS 01/24/2008  . ACUT GASTR ULCER W/O MENTION HEMORR PERF/OBST 01/24/2008   Past Medical History:  Diagnosis Date  . Arthritis   . Dyslipidemia    statin intolerance  . Hypertension   . Vertigo     Family History  Problem Relation Age of Onset  . Hypertension Mother        dx in her 87's  . Heart attack Mother 72  . Breast cancer Mother        unsure of age  . Heart attack Brother 12  . Diabetes Brother   . Hyperlipidemia Brother   . Hypertension Brother   . Heart disease Brother   . Colon cancer Neg Hx   . Esophageal cancer Neg Hx   . Rectal cancer Neg Hx   . Stomach cancer Neg Hx     Past Surgical History:  Procedure Laterality Date  . ABDOMINAL HYSTERECTOMY  1972   cervical cancer  . COLONOSCOPY    . WISDOM TOOTH EXTRACTION     uppers   Social History   Occupational History    Employer: RETIRED    Comment: Retired  Tobacco Use  . Smoking status: Former Smoker    Packs/day: 0.50    Years: 32.00    Pack years: 16.00    Types: Cigarettes  Last attempt to quit: 11/27/2002    Years since quitting: 15.7  . Smokeless tobacco: Never Used  Substance and Sexual Activity  . Alcohol use: Yes    Alcohol/week: 3.0 standard drinks    Types: 3 Standard drinks or equivalent per week    Comment: social - 2 or 3 times a month 1-2 glasses of wine  . Drug use: No  . Sexual activity: Yes    Partners: Male

## 2018-09-10 ENCOUNTER — Other Ambulatory Visit: Payer: Self-pay

## 2018-09-10 ENCOUNTER — Ambulatory Visit (INDEPENDENT_AMBULATORY_CARE_PROVIDER_SITE_OTHER): Payer: Medicare Other | Admitting: Family Medicine

## 2018-09-10 ENCOUNTER — Encounter: Payer: Self-pay | Admitting: Family Medicine

## 2018-09-10 VITALS — BP 142/68 | HR 73 | Temp 97.8°F | Ht 63.5 in | Wt 150.4 lb

## 2018-09-10 DIAGNOSIS — Z0000 Encounter for general adult medical examination without abnormal findings: Secondary | ICD-10-CM

## 2018-09-10 DIAGNOSIS — H25043 Posterior subcapsular polar age-related cataract, bilateral: Secondary | ICD-10-CM | POA: Diagnosis not present

## 2018-09-10 DIAGNOSIS — Z13820 Encounter for screening for osteoporosis: Secondary | ICD-10-CM

## 2018-09-10 DIAGNOSIS — E2839 Other primary ovarian failure: Secondary | ICD-10-CM

## 2018-09-10 DIAGNOSIS — H401131 Primary open-angle glaucoma, bilateral, mild stage: Secondary | ICD-10-CM | POA: Diagnosis not present

## 2018-09-10 DIAGNOSIS — H25013 Cortical age-related cataract, bilateral: Secondary | ICD-10-CM | POA: Diagnosis not present

## 2018-09-10 DIAGNOSIS — H2513 Age-related nuclear cataract, bilateral: Secondary | ICD-10-CM | POA: Diagnosis not present

## 2018-09-10 NOTE — Progress Notes (Signed)
Subjective:    Patient ID: Breanna Sharp, female    DOB: 09/05/1951, 67 y.o.   MRN: [id number]  HPI Breanna Sharp is a 67 y.o. female Presents today for: Chief Complaint  Patient presents with  . Annual Exam    CPE   History of hypertension, hyperlipidemia, COPD, colonic polyps and prior gastric ulcer. Pulmonologist Dr. Vaughan Browner Cardiology Dr. Debara Pickett Gastroenterology Dr. Silverio Decamp  Hypertension: BP Readings from Last 3 Encounters:  09/10/18 (!) 142/68  09/03/18 127/78  08/27/18 136/78   Lab Results  Component Value Date   CREATININE 0.79 07/17/2018  Seen October 1 by cardiology.  Shortness of breath was improved on inhalers, reportedly COPD was stable.  Walking between 40 miles per day at that time.  Denied any chest pains.  Recent lipid profile was improved from prior readings.  No med changes.  Hyperlipidemia: She unfortunately has been intolerant to multiple statins.  However cholesterol as listed below in August has been better than previous readings.  No new medications from recent cardiology visit.  Continued on Zetia and livalo.  Lab Results  Component Value Date   CHOL 167 07/17/2018   HDL 57 07/17/2018   LDLCALC 89 07/17/2018   TRIG 107 07/17/2018   CHOLHDL 2.9 07/17/2018   Lab Results  Component Value Date   ALT 16 07/17/2018   AST 17 07/17/2018   ALKPHOS 70 07/17/2018   BILITOT 0.5 07/17/2018   COPD: Pulmonologist Dr. Vaughan Browner.  Most recent office visit August 29.  Spiriva was improving her breathing with rare use of rescue inhaler.  COPD Gold A.  Chest x-ray was obtained, follow-up as needed. Plans to have that refilled here from now on.   Allergic rhinitis: She has used Flonase, Human resources officer. Doing well.   Cancer screening: Colonoscopy 09/12/2017, repeat 5 years Mammogram 09/03/2018.   Bone density screening: Bone density testing 10/01/2015  Immunizations: Immunization History  Administered Date(s) Administered  . Influenza Split 08/14/2012  .  Influenza,inj,Quad PF,6+ Mos 08/20/2013, 08/26/2014, 09/01/2015, 08/31/2016, 09/04/2017  . Influenza-Unspecified 08/08/2018  . Pneumococcal Conjugate-13 09/01/2015  . Pneumococcal Polysaccharide-23 09/07/2016  . Tdap 08/20/2013  . Zoster 09/28/2011  not sure wants Shigrix.  Didn't feel well after Zostavax - low energy.  wants to delay shingrix.   Fall screening: No falls in the past year  Depression screening: Depression screen [facility] 2/9 09/10/2018 07/15/2018 01/14/2018 12/03/2017 11/02/2017  Decreased Interest 0 0 0 0 0  Down, Depressed, Hopeless 0 0 0 0 0  PHQ - 2 Score 0 0 0 0 0   Functional status screening: Functional Status Survey: Is the patient deaf or have difficulty hearing?: No Does the patient have difficulty seeing, even when wearing glasses/contacts?: No Does the patient have difficulty concentrating, remembering, or making decisions?: No Does the patient have difficulty walking or climbing stairs?: No Does the patient have difficulty dressing or bathing?: No Does the patient have difficulty doing errands alone such as visiting a doctor's office or shopping?: No  Memory screening: 6CIT Screen 09/10/2018  What Year? 0 points  What month? 0 points  What time? 0 points  Count back from 20 0 points  Months in reverse 0 points  Repeat phrase 2 points  Total Score 2   Dental: Has some natural teeth and bridge.  Has dentist, seen last month.   Exercise: Walking 4-8 hours per day. Less Sunday. 62mles walking per week. Plans on chair yoga and some resistance exercises.  Advanced directives: Does not currently have.  Handout given.   Patient Active Problem List   Diagnosis Date Noted  . Epidemic vertigo 01/30/2018  . COPD, group A, by GOLD 2017 classification ([place]) 06/06/2017  . Dyslipidemia 05/11/2016  . Shortness of breath 05/11/2016  . Essential hypertension 05/11/2016  . Osteopenia 10/12/2015  . DOE (dyspnea on exertion) 05/02/2013  . Chronic sinusitis  05/02/2013  . Vertigo 08/14/2012  . Benign essential HTN 08/14/2012  . BMI 28.0-28.9,adult 08/14/2012  . COLONIC POLYPS 01/24/2008  . ACUT GASTR ULCER W/O MENTION HEMORR PERF/OBST 01/24/2008   Past Medical History:  Diagnosis Date  . Arthritis   . Dyslipidemia    statin intolerance  . Hypertension   . Vertigo    Past Surgical History:  Procedure Laterality Date  . ABDOMINAL HYSTERECTOMY  1972   cervical cancer  . COLONOSCOPY    . WISDOM TOOTH EXTRACTION     uppers   Allergies  Allergen Reactions  . Statins     Muscle cramps   Prior to Admission medications   Medication Sig Start Date End Date Taking? Authorizing Provider  calcium carbonate (TUMS EX) 750 MG chewable tablet Chew 1 tablet by mouth daily.   Yes [provider]  cholecalciferol (VITAMIN D) 1000 UNITS tablet Take 2,000 Units by mouth daily.    Yes [provider]  ezetimibe (ZETIA) 10 MG tablet Take 1 tablet (10 mg total) by mouth daily. KEEP OV. 08/19/18  Yes Hilty, Nadean Corwin, MD  fexofenadine (ALLEGRA) 180 MG tablet Take by mouth. 10/04/13  Yes [provider]  fluticasone (FLONASE) 50 MCG/ACT nasal spray Place 2 sprays into both nostrils daily. 07/15/18  Yes Wendie Agreste, MD  lisinopril-hydrochlorothiazide (PRINZIDE,ZESTORETIC) 10-12.5 MG tablet TAKE 1 TABLET DAILY 07/15/18  Yes Wendie Agreste, MD  Pitavastatin Calcium (LIVALO) 1 MG TABS Take 1 tablet (1 mg total) by mouth daily. 08/27/18  Yes Hilty, Nadean Corwin, MD  Polyethyl Glycol-Propyl Glycol (SYSTANE ULTRA OP) Apply 2 drops to eye daily.   Yes [provider]  Tiotropium Bromide Monohydrate (SPIRIVA RESPIMAT) 2.5 MCG/ACT AERS Take 2 puffs by mouth daily. 07/25/18  Yes Mannam, Praveen, MD  Travoprost, BAK Free, (TRAVATAN) 0.004 % SOLN ophthalmic solution Place 1 drop into both eyes at bedtime.   Yes [provider]   Social History   Socioeconomic History  . Marital status: Married    Spouse name: [name]  .  Number of children: 0  . Years of education: 64  . Highest education level: Not on file  Occupational History    Employer: RETIRED    Comment: Retired  Scientific laboratory technician  . Financial resource strain: Not on file  . Food insecurity:    Worry: Not on file    Inability: Not on file  . Transportation needs:    Medical: Not on file    Non-medical: Not on file  Tobacco Use  . Smoking status: Former Smoker    Packs/day: 0.50    Years: 32.00    Pack years: 16.00    Types: Cigarettes    Last attempt to quit: 11/27/2002    Years since quitting: 15.7  . Smokeless tobacco: Never Used  Substance and Sexual Activity  . Alcohol use: Yes    Alcohol/week: 3.0 standard drinks    Types: 3 Standard drinks or equivalent per week    Comment: social - 2 or 3 times a month 1-2 glasses of wine  . Drug use: No  . Sexual activity: Yes    Partners: Male  Lifestyle  . Physical activity:    Days per week: Not on file    Minutes per session: Not on file  . Stress: Not on file  Relationships  . Social connections:    Talks on phone: Not on file    Gets together: Not on file    Attends religious service: Not on file    Active member of club or organization: Not on file    Attends meetings of clubs or organizations: Not on file    Relationship status: Not on file  . Intimate partner violence:    Fear of current or ex partner: Not on file    Emotionally abused: Not on file    Physically abused: Not on file    Forced sexual activity: Not on file  Other Topics Concern  . Not on file  Social History Narrative   Exercise 3 to 4 times/week walking for 45 min -1 hour   Patient lives at home with her husband Legrand Como).   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   Right handed.   decaffeine Green Tea.    Review of Systems  Musculoskeletal: Positive for neck pain (hx of DDD, no new symptoms. ).  Allergic/Immunologic:  Positive for environmental allergies.  Neurological: Positive for dizziness (chronic vertigo for years.  no recent changes. ).      Objective:   Physical Exam  Constitutional: She is oriented to person, place, and time. She appears well-developed and well-nourished.  HENT:  Head: Normocephalic and atraumatic.  Eyes: Pupils are equal, round, and reactive to light. Conjunctivae and EOM are normal.  Neck: Carotid bruit is not present.  Cardiovascular: Normal rate, regular rhythm, normal heart sounds and intact distal pulses.  Pulmonary/Chest: Effort normal and breath sounds normal.  Abdominal: Soft. She exhibits no pulsatile midline mass. There is no tenderness.  Neurological: She is alert and oriented to person, place, and time.  Skin: Skin is warm and dry.  Psychiatric: She has a normal mood and affect. Her behavior is normal.  Vitals reviewed.  Vitals:   09/10/18 0853 09/10/18 0900  BP: (!) 147/83 (!) 142/68  Pulse: 73   Temp: 97.8 F (36.6 C)   TempSrc: Oral   SpO2: 97%   Weight: 150 lb 6.4 oz (68.2 kg)   Height: 5' 3.5" (1.613 m)        Assessment & Plan:   Breanna Sharp is a 67 y.o. female Medicare annual wellness visit, subsequent  - anticipatory guidance as below in AVS, screening labs if needed. Health maintenance items as above in HPI discussed/recommended as applicable.   - no concerning responses on depression, fall, or functional status screening. Any positive responses noted as above. Advanced directives discussed as in CH[facility].   Estrogen deficiency - Plan: DG Bone Density Screening for osteoporosis - Plan: DG Bone Density   No orders of the defined types were placed in this encounter.  Patient Instructions   Keep follow up with specialists as planned.  I will order bone density testing.  Thanks for coming in today.    Preventive Care 30 Years and Older, Female Preventive care refers to lifestyle choices and visits with your health care provider that  can promote health and wellness. What does preventive care include?  A yearly physical exam. This is also called an annual well check.  Dental exams once or twice a year.  Routine  eye exams. Ask your health care provider how often you should have your eyes checked.  Personal lifestyle choices, including: ? Daily care of your teeth and gums. ? Regular physical activity. ? Eating a healthy diet. ? Avoiding tobacco and drug use. ? Limiting alcohol use. ? Practicing safe sex. ? Taking low-dose aspirin every day. ? Taking vitamin and mineral supplements as recommended by your health care provider. What happens during an annual well check? The services and screenings done by your health care provider during your annual well check will depend on your age, overall health, lifestyle risk factors, and family history of disease. Counseling Your health care provider may ask you questions about your:  Alcohol use.  Tobacco use.  Drug use.  Emotional well-being.  Home and relationship well-being.  Sexual activity.  Eating habits.  History of falls.  Memory and ability to understand (cognition).  Work and work Statistician.  Reproductive health.  Screening You may have the following tests or measurements:  Height, weight, and BMI.  Blood pressure.  Lipid and cholesterol levels. These may be checked every 5 years, or more frequently if you are over 35 years old.  Skin check.  Lung cancer screening. You may have this screening every year starting at age 42 if you have a 30-pack-year history of smoking and currently smoke or have quit within the past 15 years.  Fecal occult blood test (FOBT) of the stool. You may have this test every year starting at age 33.  Flexible sigmoidoscopy or colonoscopy. You may have a sigmoidoscopy every 5 years or a colonoscopy every 10 years starting at age 16.  Hepatitis C blood test.  Hepatitis B blood test.  Sexually transmitted disease  (STD) testing.  Diabetes screening. This is done by checking your blood sugar (glucose) after you have not eaten for a while (fasting). You may have this done every 1-3 years.  Bone density scan. This is done to screen for osteoporosis. You may have this done starting at age 17.  Mammogram. This may be done every 1-2 years. Talk to your health care provider about how often you should have regular mammograms.  Talk with your health care provider about your test results, treatment options, and if necessary, the need for more tests. Vaccines Your health care provider may recommend certain vaccines, such as:  Influenza vaccine. This is recommended every year.  Tetanus, diphtheria, and acellular pertussis (Tdap, Td) vaccine. You may need a Td booster every 10 years.  Varicella vaccine. You may need this if you have not been vaccinated.  Zoster vaccine. You may need this after age 64.  Measles, mumps, and rubella (MMR) vaccine. You may need at least one dose of MMR if you were born in 1957 or later. You may also need a second dose.  Pneumococcal 13-valent conjugate (PCV13) vaccine. One dose is recommended after age 30.  Pneumococcal polysaccharide (PPSV23) vaccine. One dose is recommended after age 23.  Meningococcal vaccine. You may need this if you have certain conditions.  Hepatitis A vaccine. You may need this if you have certain conditions or if you travel or work in places where you may be exposed to hepatitis A.  Hepatitis B vaccine. You may need this if you have certain conditions or if you travel or work in places where you may be exposed to hepatitis B.  Haemophilus influenzae type b (Hib) vaccine. You may need this if you have certain conditions.  Talk to your health care provider about  which screenings and vaccines you need and how often you need them. This information is not intended to replace advice given to you by your health care provider. Make sure you discuss any  questions you have with your health care provider. Document Released: 12/10/2015 Document Revised: 08/02/2016 Document Reviewed: 09/14/2015 Elsevier Interactive Patient Education  [employer].    If you have lab work done today you will be contacted with your lab results within the next 2 weeks.  If you have not heard from [place] then please contact us. The fastest way to get your results is to register for My Chart.   IF you received an x-ray today, you will receive an invoice from [facility]. Please contact [facility] at [phone] with questions or concerns regarding your invoice.   IF you received labwork today, you will receive an invoice from [place]. Please contact [facility] at [phone] with questions or concerns regarding your invoice.   Our billing staff will not be able to assist you with questions regarding bills from these companies.  You will be contacted with the lab results as soon as they are available. The fastest way to get your results is to activate your My Chart account. Instructions are located on the last page of this paperwork. If you have not heard from [place] regarding the results in 2 weeks, please contact this office.       Signed,   [name], MD [facility].  09/11/18 8:25 AM

## 2018-09-10 NOTE — Patient Instructions (Addendum)
Keep follow up with specialists as planned.  I will order bone density testing.  Thanks for coming in today.    Preventive Care 21 Years and Older, Female Preventive care refers to lifestyle choices and visits with your health care provider that can promote health and wellness. What does preventive care include?  A yearly physical exam. This is also called an annual well check.  Dental exams once or twice a year.  Routine eye exams. Ask your health care provider how often you should have your eyes checked.  Personal lifestyle choices, including: ? Daily care of your teeth and gums. ? Regular physical activity. ? Eating a healthy diet. ? Avoiding tobacco and drug use. ? Limiting alcohol use. ? Practicing safe sex. ? Taking low-dose aspirin every day. ? Taking vitamin and mineral supplements as recommended by your health care provider. What happens during an annual well check? The services and screenings done by your health care provider during your annual well check will depend on your age, overall health, lifestyle risk factors, and family history of disease. Counseling Your health care provider may ask you questions about your:  Alcohol use.  Tobacco use.  Drug use.  Emotional well-being.  Home and relationship well-being.  Sexual activity.  Eating habits.  History of falls.  Memory and ability to understand (cognition).  Work and work Statistician.  Reproductive health.  Screening You may have the following tests or measurements:  Height, weight, and BMI.  Blood pressure.  Lipid and cholesterol levels. These may be checked every 5 years, or more frequently if you are over 26 years old.  Skin check.  Lung cancer screening. You may have this screening every year starting at age 41 if you have a 30-pack-year history of smoking and currently smoke or have quit within the past 15 years.  Fecal occult blood test (FOBT) of the stool. You may have this test  every year starting at age 16.  Flexible sigmoidoscopy or colonoscopy. You may have a sigmoidoscopy every 5 years or a colonoscopy every 10 years starting at age 45.  Hepatitis C blood test.  Hepatitis B blood test.  Sexually transmitted disease (STD) testing.  Diabetes screening. This is done by checking your blood sugar (glucose) after you have not eaten for a while (fasting). You may have this done every 1-3 years.  Bone density scan. This is done to screen for osteoporosis. You may have this done starting at age 60.  Mammogram. This may be done every 1-2 years. Talk to your health care provider about how often you should have regular mammograms.  Talk with your health care provider about your test results, treatment options, and if necessary, the need for more tests. Vaccines Your health care provider may recommend certain vaccines, such as:  Influenza vaccine. This is recommended every year.  Tetanus, diphtheria, and acellular pertussis (Tdap, Td) vaccine. You may need a Td booster every 10 years.  Varicella vaccine. You may need this if you have not been vaccinated.  Zoster vaccine. You may need this after age 57.  Measles, mumps, and rubella (MMR) vaccine. You may need at least one dose of MMR if you were born in 1957 or later. You may also need a second dose.  Pneumococcal 13-valent conjugate (PCV13) vaccine. One dose is recommended after age 18.  Pneumococcal polysaccharide (PPSV23) vaccine. One dose is recommended after age 9.  Meningococcal vaccine. You may need this if you have certain conditions.  Hepatitis A vaccine. You  may need this if you have certain conditions or if you travel or work in places where you may be exposed to hepatitis A.  Hepatitis B vaccine. You may need this if you have certain conditions or if you travel or work in places where you may be exposed to hepatitis B.  Haemophilus influenzae type b (Hib) vaccine. You may need this if you have  certain conditions.  Talk to your health care provider about which screenings and vaccines you need and how often you need them. This information is not intended to replace advice given to you by your health care provider. Make sure you discuss any questions you have with your health care provider. Document Released: 12/10/2015 Document Revised: 08/02/2016 Document Reviewed: 09/14/2015 Elsevier Interactive Patient Education  [employer].    If you have lab work done today you will be contacted with your lab results within the next 2 weeks.  If you have not heard from [place] then please contact us. The fastest way to get your results is to register for My Chart.   IF you received an x-ray today, you will receive an invoice from [facility]. Please contact [facility] at [phone] with questions or concerns regarding your invoice.   IF you received labwork today, you will receive an invoice from [place]. Please contact [facility] at [phone] with questions or concerns regarding your invoice.   Our billing staff will not be able to assist you with questions regarding bills from these companies.  You will be contacted with the lab results as soon as they are available. The fastest way to get your results is to activate your My Chart account. Instructions are located on the last page of this paperwork. If you have not heard from [place] regarding the results in 2 weeks, please contact this office.

## 2018-10-14 ENCOUNTER — Inpatient Hospital Stay (INDEPENDENT_AMBULATORY_CARE_PROVIDER_SITE_OTHER): Payer: Medicare Other | Admitting: Orthopaedic Surgery

## 2018-11-12 ENCOUNTER — Encounter: Payer: Self-pay | Admitting: Family Medicine

## 2018-11-12 ENCOUNTER — Ambulatory Visit (INDEPENDENT_AMBULATORY_CARE_PROVIDER_SITE_OTHER): Payer: Medicare Other | Admitting: Family Medicine

## 2018-11-12 ENCOUNTER — Other Ambulatory Visit: Payer: Self-pay

## 2018-11-12 VITALS — BP 152/70 | HR 81 | Temp 98.0°F | Resp 18 | Ht 64.49 in | Wt 154.8 lb

## 2018-11-12 DIAGNOSIS — J0140 Acute pansinusitis, unspecified: Secondary | ICD-10-CM

## 2018-11-12 MED ORDER — GUAIFENESIN ER 1200 MG PO TB12: 1.0000 | ORAL_TABLET | Freq: Two times a day (BID) | ORAL | 1 refills | Status: DC | PRN

## 2018-11-12 MED ORDER — BENZONATATE 200 MG PO CAPS: 200.0000 mg | ORAL_CAPSULE | Freq: Two times a day (BID) | ORAL | 0 refills | Status: DC | PRN

## 2018-11-12 MED ORDER — AZELASTINE HCL 0.1 % NA SOLN: 1.0000 | Freq: Two times a day (BID) | NASAL | 1 refills | Status: DC

## 2018-11-12 MED ORDER — IPRATROPIUM BROMIDE 0.03 % NA SOLN: 2.0000 | Freq: Four times a day (QID) | NASAL | 0 refills | Status: DC

## 2018-11-12 NOTE — Progress Notes (Signed)
Subjective:    Patient: Breanna Sharp  DOB: 12-06-1950; 67 y.o.   MRN: [id number]  Chief Complaint  Patient presents with  . Cough    X 2 weeks- pt states cough is better  . Sinus Problem    X 2 weeks    HPI Has kept draining and coughing and as soon as she thinks it has run its course, it would come right back.  She topped using her flonase sev d ago as her right nose was clear and then it started bleeding a little when she blew her nose.  No f/c. No sinus pain/pressure. Not using any otc meds.  Normally stops after 6-7d but this just comes back.  Is having some PND but is coughing up clear mucous, no SHoB/DOE.  She has been sleeping well.   Walked 5 mi yesterday. Eating oranges every day.  Medical History Past Medical History:  Diagnosis Date  . Arthritis   . Dyslipidemia    statin intolerance  . Hypertension   . Vertigo    Past Surgical History:  Procedure Laterality Date  . ABDOMINAL HYSTERECTOMY  1972   cervical cancer  . COLONOSCOPY    . WISDOM TOOTH EXTRACTION     uppers   Current Outpatient Medications on File Prior to Visit  Medication Sig Dispense Refill  . calcium carbonate (TUMS EX) 750 MG chewable tablet Chew 1 tablet by mouth daily.    . cholecalciferol (VITAMIN D) 1000 UNITS tablet Take 2,000 Units by mouth daily.     [name] ezetimibe (ZETIA) 10 MG tablet Take 1 tablet (10 mg total) by mouth daily. KEEP OV. 90 tablet 0  . fexofenadine (ALLEGRA) 180 MG tablet Take by mouth.    . fluticasone (FLONASE) 50 MCG/ACT nasal spray Place 2 sprays into both nostrils daily. 48 g 6  . lisinopril-hydrochlorothiazide (PRINZIDE,ZESTORETIC) 10-12.5 MG tablet TAKE 1 TABLET DAILY 90 tablet 3  . Pitavastatin Calcium (LIVALO) 1 MG TABS Take 1 tablet (1 mg total) by mouth daily. 90 tablet 3  . Polyethyl Glycol-Propyl Glycol (SYSTANE ULTRA OP) Apply 2 drops to eye daily.    . Tiotropium Bromide Monohydrate (SPIRIVA RESPIMAT) 2.5 MCG/ACT AERS Take 2 puffs by mouth daily. 12 g 1    . Travoprost, BAK Free, (TRAVATAN) 0.004 % SOLN ophthalmic solution Place 1 drop into both eyes at bedtime.     Current Facility-Administered Medications on File Prior to Visit  Medication Dose Route Frequency Provider Last Rate Last Dose  . 0.9 %  sodium chloride infusion  500 mL Intravenous Continuous Nandigam, Venia Minks, MD       Allergies  Allergen Reactions  . Statins     Muscle cramps   Family History  Problem Relation Age of Onset  . Hypertension Mother        dx in her 47's  . Heart attack Mother 66  . Breast cancer Mother        unsure of age  . Heart attack Brother 15  . Diabetes Brother   . Hyperlipidemia Brother   . Hypertension Brother   . Heart disease Brother   . Colon cancer Neg Hx   . Esophageal cancer Neg Hx   . Rectal cancer Neg Hx   . Stomach cancer Neg Hx    Social History   Socioeconomic History  . Marital status: Married    Spouse name: [name]  . Number of children: 0  . Years of education: 2  . Highest education level:  Not on file  Occupational History    Employer: RETIRED    Comment: Retired  Scientific laboratory technician  . Financial resource strain: Not on file  . Food insecurity:    Worry: Not on file    Inability: Not on file  . Transportation needs:    Medical: Not on file    Non-medical: Not on file  Tobacco Use  . Smoking status: Former Smoker    Packs/day: 0.50    Years: 32.00    Pack years: 16.00    Types: Cigarettes    Last attempt to quit: 11/27/2002    Years since quitting: 15.9  . Smokeless tobacco: Never Used  Substance and Sexual Activity  . Alcohol use: Yes    Alcohol/week: 3.0 standard drinks    Types: 3 Standard drinks or equivalent per week    Comment: social - 2 or 3 times a month 1-2 glasses of wine  . Drug use: No  . Sexual activity: Yes    Partners: Male  Lifestyle  . Physical activity:    Days per week: Not on file    Minutes per session: Not on file  . Stress: Not on file  Relationships  . Social connections:     Talks on phone: Not on file    Gets together: Not on file    Attends religious service: Not on file    Active member of club or organization: Not on file    Attends meetings of clubs or organizations: Not on file    Relationship status: Not on file  Other Topics Concern  . Not on file  Social History Narrative   Exercise 3 to 4 times/week walking for 45 min -1 hour   Patient lives at home with her husband Legrand Como).   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   Right handed.   decaffeine Green Tea.   Depression screen [facility] 2/9 11/12/2018 09/10/2018 07/15/2018 01/14/2018 12/03/2017  Decreased Interest 0 0 0 0 0  Down, Depressed, Hopeless 0 0 0 0 0  PHQ - 2 Score 0 0 0 0 0    ROS As noted in HPI  Objective:  BP (!) 152/80   Pulse 81   Temp 98 F (36.7 C) (Oral)   Resp 18   Ht 5' 4.49" (1.638 m)   Wt 154 lb 12.8 oz (70.2 kg)   SpO2 98%   BMI 26.17 kg/m  Physical Exam Constitutional:      General: She is not in acute distress.    Appearance: She is well-developed. She is ill-appearing. She is not diaphoretic.  HENT:     Head: Normocephalic and atraumatic.     Right Ear: Ear canal and external ear normal. A middle ear effusion is present. Tympanic membrane is retracted.     Left Ear: Ear canal and external ear normal. A middle ear effusion is present. Tympanic membrane is retracted.     Nose: Mucosal edema and rhinorrhea present.     Right Sinus: Maxillary sinus tenderness present.     Left Sinus: Maxillary sinus tenderness present.     Mouth/Throat:     Pharynx: Uvula midline. Posterior oropharyngeal erythema present. No oropharyngeal exudate.     Tonsils: No tonsillar abscesses.  Eyes:     General: No scleral icterus.       Right eye: No discharge.        Left eye:  No discharge.     Conjunctiva/sclera: Conjunctivae normal.  Neck:     Musculoskeletal: Normal range of motion and neck  supple.  Cardiovascular:     Rate and Rhythm: Normal rate and regular rhythm.     Heart sounds: Normal heart sounds.  Pulmonary:     Effort: Pulmonary effort is normal.     Breath sounds: Normal breath sounds.  Lymphadenopathy:     Head:     Right side of head: Submandibular adenopathy present. No preauricular or posterior auricular adenopathy.     Left side of head: Submandibular adenopathy present. No preauricular or posterior auricular adenopathy.     Cervical: No cervical adenopathy.     Upper Body:     Right upper body: No supraclavicular adenopathy.     Left upper body: No supraclavicular adenopathy.  Skin:    General: Skin is warm and dry.     Findings: No erythema.  Neurological:     Mental Status: She is oriented to person, place, and time. She is lethargic.  Psychiatric:        Behavior: Behavior normal.       Assessment & Plan:   1. Acute non-recurrent pansinusitis    Patient will continue on current chronic medications other than changes noted above, so ok to refill when needed.   See after visit summary for patient specific instructions.   Meds ordered this encounter  Medications  . benzonatate (TESSALON) 200 MG capsule    Sig: Take 1 capsule (200 mg total) by mouth 2 (two) times daily as needed for cough.    Dispense:  20 capsule    Refill:  0  . Guaifenesin (MUCINEX MAXIMUM STRENGTH) 1200 MG TB12    Sig: Take 1 tablet (1,200 mg total) by mouth every 12 (twelve) hours as needed.    Dispense:  14 tablet    Refill:  1  . ipratropium (ATROVENT) 0.03 % nasal spray    Sig: Place 2 sprays into the nose 4 (four) times daily.    Dispense:  30 mL    Refill:  0  . azelastine (ASTELIN) 0.1 % nasal spray    Sig: Place 1 spray into both nostrils 2 (two) times daily. Use in each nostril as directed    Dispense:  30 mL    Refill:  1    Patient verbalized to me that they understand the following: diagnosis, what is being done for them, what to expect and what  should be done at home.  Their questions have been answered. They understand that I am unable to predict every possible medication interaction or adverse outcome and that if any unexpected symptoms arise, they should contact us and their pharmacist, as well as never hesitate to seek urgent/emergent care at [facility] or ER if they think it might be warranted.    Delman Cheadle, MD, MPH [facility] [address] [place], [place]  [address] [phone] Office phone  [phone] Office fax  11/12/18 9:42 AM

## 2018-11-12 NOTE — Patient Instructions (Addendum)
Use the azelastine twice a day - first thing in the morning and lasting at night.  Then use the ipratropium during the day as needed for congestion and postnasal drip.  You can use the benzonatate also as needed to prevent cough during the day and stay on them Mucinex every 12 hours to thin the mucus so it is it easier to cough up a blowout.  Recommend staying on the azelastine for the next 2 weeks but the other medications you may discontinue as soon as your symptoms resolve or when you feel you no longer need them.    If you have lab work done today you will be contacted with your lab results within the next 2 weeks.  If you have not heard from [place] then please contact us. The fastest way to get your results is to register for My Chart.   IF you received an x-ray today, you will receive an invoice from [facility]. Please contact [facility] at [phone] with questions or concerns regarding your invoice.   IF you received labwork today, you will receive an invoice from [facility]. Please contact [facility] at [phone] with questions or concerns regarding your invoice.   Our billing staff will not be able to assist you with questions regarding bills from these companies.  You will be contacted with the lab results as soon as they are available. The fastest way to get your results is to activate your My Chart account. Instructions are located on the last page of this paperwork. If you have not heard from [place] regarding the results in 2 weeks, please contact this office.     Postnasal Drip Postnasal drip is the feeling of mucus going down the back of your throat. Mucus is a slimy substance that moistens and cleans your nose and throat, as well as the air pockets in face bones near your forehead and cheeks (sinuses). Small amounts of mucus pass from your nose and sinuses down the back of your throat all the time. This is normal. When you produce too much mucus or the mucus gets too  thick, you can feel it. Some common causes of postnasal drip include:  Having more mucus because of: ? A cold or the flu. ? Allergies. ? Cold air. ? Certain medicines.  Having more mucus that is thicker because of: ? A sinus or nasal infection. ? Dry air. ? A food allergy.  Follow these instructions at home: Relieving discomfort  Gargle with a salt-water mixture 3-4 times a day or as needed. To make a salt-water mixture, completely dissolve -1 tsp of salt in 1 cup of warm water.  If the air in your home is dry, use a humidifier to add moisture to the air.  Use a saline spray or container (neti pot) to flush out the nose (nasal irrigation). These methods can help clear away mucus and keep the nasal passages moist. General instructions  Take over-the-counter and prescription medicines only as told by your health care provider.  Follow instructions from your health care provider about eating or drinking restrictions. You may need to avoid caffeine.  Avoid things that you know you are allergic to (allergens), like dust, mold, pollen, pets, or certain foods.  Drink enough fluid to keep your urine pale yellow.  Keep all follow-up visits as told by your health care provider. This is important. Contact a health care provider if:  You have a fever.  You have a sore throat.  You have difficulty  swallowing.  You have headache.  You have sinus pain.  You have a cough that does not go away.  The mucus from your nose becomes thick and is green or yellow in color.  You have cold or flu symptoms that last more than 10 days. Summary  Postnasal drip is the feeling of mucus going down the back of your throat.  If your health care provider approves, use nasal irrigation or a nasal spray 2?4 times a day.  Avoid things that you know you are allergic to (allergens), like dust, mold, pollen, pets, or certain foods. This information is not intended to replace advice given to you by  your health care provider. Make sure you discuss any questions you have with your health care provider. Document Released: 02/26/2017 Document Revised: 02/26/2017 Document Reviewed: 02/26/2017 Elsevier Interactive Patient Education  [employer].

## 2018-11-22 ENCOUNTER — Ambulatory Visit
Admission: RE | Admit: 2018-11-22 | Discharge: 2018-11-22 | Disposition: A | Discharge status: Home / Self Care | Payer: Medicare Other | Source: Ambulatory Visit | Attending: Family Medicine | Admitting: Family Medicine

## 2018-11-22 DIAGNOSIS — M85851 Other specified disorders of bone density and structure, right thigh: Secondary | ICD-10-CM | POA: Diagnosis not present

## 2018-11-22 DIAGNOSIS — Z780 Asymptomatic menopausal state: Secondary | ICD-10-CM | POA: Diagnosis not present

## 2018-11-22 DIAGNOSIS — Z13820 Encounter for screening for osteoporosis: Secondary | ICD-10-CM

## 2018-11-22 DIAGNOSIS — E2839 Other primary ovarian failure: Secondary | ICD-10-CM

## 2018-12-01 ENCOUNTER — Other Ambulatory Visit: Payer: Self-pay | Admitting: Internal Medicine

## 2018-12-02 ENCOUNTER — Telehealth: Payer: Self-pay | Admitting: Internal Medicine

## 2018-12-02 MED ORDER — EZETIMIBE 10 MG PO TABS: 10.0000 mg | ORAL_TABLET | Freq: Every day | ORAL | 1 refills | Status: DC

## 2018-12-02 NOTE — Telephone Encounter (Signed)
New Message    *STAT* If patient is at the pharmacy, call can be transferred to refill team.   1. Which medications need to be refilled? (please list name of each medication and dose if known) ezetimibe (ZETIA) 10 MG tablet  2. Which pharmacy/location (including street and city if local pharmacy) is medication to be sent to? [facility] - [address], [place]  3. Do they need a 30 day or 90 day supply? [place]

## 2018-12-02 NOTE — Telephone Encounter (Signed)
Rx has been sent to the pharmacy electronically. ° °

## 2018-12-10 DIAGNOSIS — H401131 Primary open-angle glaucoma, bilateral, mild stage: Secondary | ICD-10-CM | POA: Diagnosis not present

## 2018-12-10 DIAGNOSIS — H43813 Vitreous degeneration, bilateral: Secondary | ICD-10-CM | POA: Diagnosis not present

## 2018-12-10 DIAGNOSIS — H2513 Age-related nuclear cataract, bilateral: Secondary | ICD-10-CM | POA: Diagnosis not present

## 2018-12-10 DIAGNOSIS — H18413 Arcus senilis, bilateral: Secondary | ICD-10-CM | POA: Diagnosis not present

## 2018-12-12 DIAGNOSIS — H6121 Impacted cerumen, right ear: Secondary | ICD-10-CM | POA: Diagnosis not present

## 2019-01-20 ENCOUNTER — Encounter: Payer: Self-pay | Admitting: Family Medicine

## 2019-01-20 ENCOUNTER — Ambulatory Visit (INDEPENDENT_AMBULATORY_CARE_PROVIDER_SITE_OTHER): Payer: Medicare Other | Admitting: Family Medicine

## 2019-01-20 VITALS — BP 138/76 | HR 74 | Temp 98.0°F | Resp 14 | Ht 64.0 in | Wt 151.4 lb

## 2019-01-20 DIAGNOSIS — J449 Chronic obstructive pulmonary disease, unspecified: Secondary | ICD-10-CM

## 2019-01-20 DIAGNOSIS — I10 Essential (primary) hypertension: Secondary | ICD-10-CM | POA: Diagnosis not present

## 2019-01-20 DIAGNOSIS — E785 Hyperlipidemia, unspecified: Secondary | ICD-10-CM

## 2019-01-20 MED ORDER — TIOTROPIUM BROMIDE MONOHYDRATE 2.5 MCG/ACT IN AERS: 2.0000 | INHALATION_SPRAY | Freq: Every day | RESPIRATORY_TRACT | 1 refills | Status: DC

## 2019-01-20 NOTE — Progress Notes (Signed)
Subjective:    Patient ID: Breanna Sharp, female    DOB: 05-Dec-1950, 68 y.o.   MRN: [id number]  HPI Breanna Sharp is a 68 y.o. female Presents today for: Chief Complaint  Patient presents with  . Hyperlipidemia    n4 mon check up and lab work  . Hypertension    bp has been  running fine at home 115/72  . Shortness of Breath    need refill on spitiva. Specialist is turning this over to Dr [name] to monitor now   Hypertension: BP Readings from Last 3 Encounters:  01/20/19 138/76  11/12/18 (!) 152/70  09/10/18 (!) 142/68   Lab Results  Component Value Date   CREATININE 0.79 07/17/2018  Lisinopril HCTZ 10/12.5 mg daily. Home BP 115/72.  Certain foods will increase BP. Overall stable. No new side effects of meds.   Hyperlipidemia:  Lab Results  Component Value Date   CHOL 167 07/17/2018   HDL 57 07/17/2018   LDLCALC 89 07/17/2018   TRIG 107 07/17/2018   CHOLHDL 2.9 07/17/2018   Lab Results  Component Value Date   ALT 16 07/17/2018   AST 17 07/17/2018   ALKPHOS 70 07/17/2018   BILITOT 0.5 07/17/2018  Zetia 10 mg daily.  Livalo 1 mg daily.  Intolerant to other statins. Followed by Dr. Debara Pickett.   COPD COPD group A by gold 2017 classification.  PFTs without apparent obstruction but there was evidence of small airway disease, extensive smoking history and responded well to Spiriva.  Was continued on the same.  Saw pulmonary in August 2019.  Chest x-ray without acute abnormalities at that time.  Now will be following with me for COPD management.  Daily Spiriva. Doing well- stable sx's.     Patient Active Problem List   Diagnosis Date Noted  . Epidemic vertigo 01/30/2018  . COPD, group A, by GOLD 2017 classification ([place]) 06/06/2017  . Dyslipidemia 05/11/2016  . Shortness of breath 05/11/2016  . Essential hypertension 05/11/2016  . Osteopenia 10/12/2015  . DOE (dyspnea on exertion) 05/02/2013  . Chronic sinusitis 05/02/2013  . Vertigo 08/14/2012  . Benign  essential HTN 08/14/2012  . BMI 28.0-28.9,adult 08/14/2012  . COLONIC POLYPS 01/24/2008  . ACUT GASTR ULCER W/O MENTION HEMORR PERF/OBST 01/24/2008   Past Medical History:  Diagnosis Date  . Arthritis   . Dyslipidemia    statin intolerance  . Hypertension   . Vertigo    Past Surgical History:  Procedure Laterality Date  . ABDOMINAL HYSTERECTOMY  1972   cervical cancer  . COLONOSCOPY    . WISDOM TOOTH EXTRACTION     uppers   Allergies  Allergen Reactions  . Statins     Muscle cramps   Prior to Admission medications   Medication Sig Start Date End Date Taking? Authorizing Provider  calcium carbonate (TUMS EX) 750 MG chewable tablet Chew 1 tablet by mouth daily.   Yes [provider]  cholecalciferol (VITAMIN D) 1000 UNITS tablet Take 2,000 Units by mouth daily.    Yes [provider]  ezetimibe (ZETIA) 10 MG tablet Take 1 tablet (10 mg total) by mouth daily. 12/02/18  Yes Hilty, Nadean Corwin, MD  fexofenadine (ALLEGRA) 180 MG tablet Take by mouth. 10/04/13  Yes [provider]  fluticasone (FLONASE) 50 MCG/ACT nasal spray Place 2 sprays into both nostrils daily. 07/15/18  Yes Wendie Agreste, MD  lisinopril-hydrochlorothiazide (PRINZIDE,ZESTORETIC) 10-12.5 MG tablet TAKE 1 TABLET DAILY 07/15/18  Yes Wendie Agreste, MD  Pitavastatin Calcium (LIVALO) 1 MG TABS Take 1 tablet (1 mg total) by mouth daily. 08/27/18  Yes Hilty, Nadean Corwin, MD  Polyethyl Glycol-Propyl Glycol (SYSTANE ULTRA OP) Apply 2 drops to eye daily.   Yes [provider]  Tiotropium Bromide Monohydrate (SPIRIVA RESPIMAT) 2.5 MCG/ACT AERS Take 2 puffs by mouth daily. 07/25/18  Yes Mannam, Praveen, MD  Travoprost, BAK Free, (TRAVATAN) 0.004 % SOLN ophthalmic solution Place 1 drop into both eyes at bedtime.   Yes [provider]   Social History   Socioeconomic History  . Marital status: Married    Spouse name: [name]  . Number of children: 0  . Years of education: 85  .  Highest education level: Not on file  Occupational History    Employer: RETIRED    Comment: Retired  Scientific laboratory technician  . Financial resource strain: Not on file  . Food insecurity:    Worry: Not on file    Inability: Not on file  . Transportation needs:    Medical: Not on file    Non-medical: Not on file  Tobacco Use  . Smoking status: Former Smoker    Packs/day: 0.50    Years: 32.00    Pack years: 16.00    Types: Cigarettes    Last attempt to quit: 11/27/2002    Years since quitting: 16.1  . Smokeless tobacco: Never Used  Substance and Sexual Activity  . Alcohol use: Yes    Alcohol/week: 3.0 standard drinks    Types: 3 Standard drinks or equivalent per week    Comment: social - 2 or 3 times a month 1-2 glasses of wine  . Drug use: No  . Sexual activity: Yes    Partners: Male  Lifestyle  . Physical activity:    Days per week: Not on file    Minutes per session: Not on file  . Stress: Not on file  Relationships  . Social connections:    Talks on phone: Not on file    Gets together: Not on file    Attends religious service: Not on file    Active member of club or organization: Not on file    Attends meetings of clubs or organizations: Not on file    Relationship status: Not on file  . Intimate partner violence:    Fear of current or ex partner: Not on file    Emotionally abused: Not on file    Physically abused: Not on file    Forced sexual activity: Not on file  Other Topics Concern  . Not on file  Social History Narrative   Exercise 3 to 4 times/week walking for 45 min -1 hour   Patient lives at home with her husband Breanna Sharp).   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   Right handed.   decaffeine Green Tea.    Review of Systems  Constitutional: Negative for fatigue and unexpected weight change.  Respiratory: Negative for chest tightness and shortness of breath.     Cardiovascular: Negative for chest pain, palpitations and leg swelling.  Gastrointestinal: Negative for abdominal pain and blood in stool.  Neurological: Negative for dizziness, syncope, light-headedness and headaches.       Objective:   Physical Exam Vitals signs reviewed.  Constitutional:      Appearance: She is well-developed.  HENT:     Head: Normocephalic and atraumatic.  Eyes:     Conjunctiva/sclera:  Conjunctivae normal.     Pupils: Pupils are equal, round, and reactive to light.  Neck:     Vascular: No carotid bruit.  Cardiovascular:     Rate and Rhythm: Normal rate and regular rhythm.     Heart sounds: Normal heart sounds.  Pulmonary:     Effort: Pulmonary effort is normal.     Breath sounds: Normal breath sounds.  Abdominal:     Palpations: Abdomen is soft. There is no pulsatile mass.     Tenderness: There is no abdominal tenderness.  Skin:    General: Skin is warm and dry.  Neurological:     Mental Status: She is alert and oriented to person, place, and time.  Psychiatric:        Behavior: Behavior normal.    Vitals:   01/20/19 0805  BP: 138/76  Pulse: 74  Resp: 14  Temp: 98 F (36.7 C)  TempSrc: Oral  SpO2: 96%  Weight: 151 lb 6.4 oz (68.7 kg)  Height: 5\' 4"  (1.626 m)       Assessment & Plan:   ANELIS HRIVNAK is a 68 y.o. female Chronic obstructive pulmonary disease, unspecified COPD type ([place]) - Plan: Tiotropium Bromide Monohydrate (SPIRIVA RESPIMAT) 2.5 MCG/ACT AERS  - stable. No med changes.   Hyperlipidemia, unspecified hyperlipidemia type - Plan: Lipid Panel  - tolerating current regimen. Labs pending.   Essential hypertension - Plan: Comprehensive metabolic panel  - stable. Continue same regimen, labs pending.   Meds ordered this encounter  Medications  . Tiotropium Bromide Monohydrate (SPIRIVA RESPIMAT) 2.5 MCG/ACT AERS    Sig: Take 2 puffs by mouth daily.    Dispense:  12 g    Refill:  1   Patient Instructions    Goal  blood pressure is  below 140/90. No changes today. Thanks for coming in today. Please return for Wellness visit in 6 months.   If you have lab work done today you will be contacted with your lab results within the next 2 weeks.  If you have not heard from [place] then please contact us. The fastest way to get your results is to register for My Chart.   IF you received an x-ray today, you will receive an invoice from [facility]. Please contact [facility] at [phone] with questions or concerns regarding your invoice.   IF you received labwork today, you will receive an invoice from [place]. Please contact [facility] at [phone] with questions or concerns regarding your invoice.   Our billing staff will not be able to assist you with questions regarding bills from these companies.  You will be contacted with the lab results as soon as they are available. The fastest way to get your results is to activate your My Chart account. Instructions are located on the last page of this paperwork. If you have not heard from [place] regarding the results in 2 weeks, please contact this office.       Signed,   [name], MD [facility].  01/20/19 8:24 AM

## 2019-01-20 NOTE — Patient Instructions (Addendum)
  Goal blood pressure is  below 140/90. No changes today. Thanks for coming in today. Please return for Wellness visit in 6 months.   If you have lab work done today you will be contacted with your lab results within the next 2 weeks.  If you have not heard from [place] then please contact us. The fastest way to get your results is to register for My Chart.   IF you received an x-ray today, you will receive an invoice from [facility]. Please contact [facility] at [phone] with questions or concerns regarding your invoice.   IF you received labwork today, you will receive an invoice from [facility]. Please contact [facility] at [phone] with questions or concerns regarding your invoice.   Our billing staff will not be able to assist you with questions regarding bills from these companies.  You will be contacted with the lab results as soon as they are available. The fastest way to get your results is to activate your My Chart account. Instructions are located on the last page of this paperwork. If you have not heard from [place] regarding the results in 2 weeks, please contact this office.

## 2019-01-21 LAB — COMPREHENSIVE METABOLIC PANEL
ALT: 26 IU/L (ref 0–32)
AST: 21 IU/L (ref 0–40)
Albumin/Globulin Ratio: 2.2 (ref 1.2–2.2)
Albumin: 4.6 g/dL (ref 3.8–4.8)
Alkaline Phosphatase: 68 IU/L (ref 39–117)
BUN/Creatinine Ratio: 16 (ref 12–28)
BUN: 12 mg/dL (ref 8–27)
Bilirubin Total: 0.4 mg/dL (ref 0.0–1.2)
CO2: 23 mmol/L (ref 20–29)
Calcium: 9.8 mg/dL (ref 8.7–10.3)
Chloride: 103 mmol/L (ref 96–106)
Creatinine, Ser: 0.77 mg/dL (ref 0.57–1.00)
GFR calc Af Amer: 92 mL/min/{1.73_m2} (ref >59)
GFR calc non Af Amer: 80 mL/min/{1.73_m2} (ref >59)
Globulin, Total: 2.1 g/dL (ref 1.5–4.5)
Glucose: 115 mg/dL — ABNORMAL HIGH (ref 65–99)
Potassium: 3.9 mmol/L (ref 3.5–5.2)
Sodium: 142 mmol/L (ref 134–144)
Total Protein: 6.7 g/dL (ref 6.0–8.5)

## 2019-01-21 LAB — LIPID PANEL
Chol/HDL Ratio: 3.6 ratio (ref 0.0–4.4)
Cholesterol, Total: 181 mg/dL (ref 100–199)
HDL: 50 mg/dL (ref >39)
LDL Calculated: 103 mg/dL — ABNORMAL HIGH (ref 0–99)
Triglycerides: 139 mg/dL (ref 0–149)
VLDL Cholesterol Cal: 28 mg/dL (ref 5–40)

## 2019-03-11 ENCOUNTER — Emergency Department (HOSPITAL_BASED_OUTPATIENT_CLINIC_OR_DEPARTMENT_OTHER): Payer: Medicare Other

## 2019-03-11 ENCOUNTER — Encounter (HOSPITAL_BASED_OUTPATIENT_CLINIC_OR_DEPARTMENT_OTHER): Payer: Self-pay | Admitting: Emergency Medicine

## 2019-03-11 ENCOUNTER — Emergency Department (HOSPITAL_BASED_OUTPATIENT_CLINIC_OR_DEPARTMENT_OTHER)
Admission: EM | Admit: 2019-03-11 | Discharge: 2019-03-11 | Disposition: A | Discharge status: Home / Self Care | Payer: Medicare Other | Attending: Emergency Medicine | Admitting: Emergency Medicine

## 2019-03-11 ENCOUNTER — Other Ambulatory Visit: Payer: Self-pay

## 2019-03-11 DIAGNOSIS — I10 Essential (primary) hypertension: Secondary | ICD-10-CM | POA: Insufficient documentation

## 2019-03-11 DIAGNOSIS — S6991XD Unspecified injury of right wrist, hand and finger(s), subsequent encounter: Secondary | ICD-10-CM | POA: Insufficient documentation

## 2019-03-11 DIAGNOSIS — J449 Chronic obstructive pulmonary disease, unspecified: Secondary | ICD-10-CM | POA: Diagnosis not present

## 2019-03-11 DIAGNOSIS — Y939 Activity, unspecified: Secondary | ICD-10-CM | POA: Diagnosis not present

## 2019-03-11 DIAGNOSIS — Y999 Unspecified external cause status: Secondary | ICD-10-CM | POA: Diagnosis not present

## 2019-03-11 DIAGNOSIS — H6691 Otitis media, unspecified, right ear: Secondary | ICD-10-CM | POA: Insufficient documentation

## 2019-03-11 DIAGNOSIS — S62632A Displaced fracture of distal phalanx of right middle finger, initial encounter for closed fracture: Secondary | ICD-10-CM | POA: Diagnosis not present

## 2019-03-11 DIAGNOSIS — X509XXD Other and unspecified overexertion or strenuous movements or postures, subsequent encounter: Secondary | ICD-10-CM | POA: Insufficient documentation

## 2019-03-11 DIAGNOSIS — Y929 Unspecified place or not applicable: Secondary | ICD-10-CM | POA: Diagnosis not present

## 2019-03-11 DIAGNOSIS — Z79899 Other long term (current) drug therapy: Secondary | ICD-10-CM | POA: Insufficient documentation

## 2019-03-11 DIAGNOSIS — Z87891 Personal history of nicotine dependence: Secondary | ICD-10-CM | POA: Insufficient documentation

## 2019-03-11 NOTE — Discharge Instructions (Signed)
We have discussed the results of your xray. I have provided follow up for a hand specialist, please schedule an appointment at your earliest convenience.

## 2019-03-11 NOTE — ED Triage Notes (Signed)
Pt states her right ear has been hurting for 2-3 days.  Pt states she feels chills but reminds her of anxiety.  Pt also has right middle finger injury which happened 2-3 weeks ago but its getting worse

## 2019-03-11 NOTE — ED Provider Notes (Signed)
[facility] EMERGENCY DEPARTMENT Provider Note   CSN: [id number] Arrival date & time: 03/11/19  1009    History   Chief Complaint Chief Complaint  Patient presents with  . Otalgia  . Finger Injury  . Anxiety    HPI Breanna Sharp is a 68 y.o. female.     68 y/o female with a PMH of HTN, vertigo, COPD presents to the ED with multiple chief complaints such as 'feeling nervous", right ear pain and finger injury. She reports feeling 'cold" turning the temperature in her home without improvement. She also reports feeling a sense of unease, patient had a previous history of anxiety in the past but reports "I think I outgrew it". States her symptoms are relieve with organizing her closet.  Patient also reports right ear pain, last had her ear wax removed by her ENT in January, reports feeling "irritable, like full of wax", she reports she cannot hear as well but still hears well. She reports ENT has to remove the wax with a micro forceps. She reports the ear irrigation does not work as it affects her vertigo and does not get wax removed.  Patient's third complaint reports she has catching a falling object from her kitchen 3 weeks ago when she bent her right middle finger forward, she's had pain to the tip of her finger since, which she has been taking tylenol for. She reports this pain is worse with flexion. Patient denies any dizziness, lightheaded, chest pain, shortness of breath, abdominal pain or fever.      Past Medical History:  Diagnosis Date  . Arthritis   . Dyslipidemia    statin intolerance  . Hypertension   . Vertigo     Patient Active Problem List   Diagnosis Date Noted  . Epidemic vertigo 01/30/2018  . COPD, group A, by GOLD 2017 classification ([place]) 06/06/2017  . Dyslipidemia 05/11/2016  . Shortness of breath 05/11/2016  . Essential hypertension 05/11/2016  . Osteopenia 10/12/2015  . DOE (dyspnea on exertion) 05/02/2013  . Chronic sinusitis  05/02/2013  . Vertigo 08/14/2012  . Benign essential HTN 08/14/2012  . BMI 28.0-28.9,adult 08/14/2012  . COLONIC POLYPS 01/24/2008  . ACUT GASTR ULCER W/O MENTION HEMORR PERF/OBST 01/24/2008    Past Surgical History:  Procedure Laterality Date  . ABDOMINAL HYSTERECTOMY  1972   cervical cancer  . COLONOSCOPY    . WISDOM TOOTH EXTRACTION     uppers     OB History   No obstetric history on file.      Home Medications    Prior to Admission medications   Medication Sig Start Date End Date Taking? Authorizing Provider  calcium carbonate (TUMS EX) 750 MG chewable tablet Chew 1 tablet by mouth daily.    [provider]  cholecalciferol (VITAMIN D) 1000 UNITS tablet Take 2,000 Units by mouth daily.     [provider]  ezetimibe (ZETIA) 10 MG tablet Take 1 tablet (10 mg total) by mouth daily. 12/02/18   Hilty, Nadean Corwin, MD  fexofenadine (ALLEGRA) 180 MG tablet Take by mouth. 10/04/13   [provider]  fluticasone (FLONASE) 50 MCG/ACT nasal spray Place 2 sprays into both nostrils daily. 07/15/18   Wendie Agreste, MD  lisinopril-hydrochlorothiazide (PRINZIDE,ZESTORETIC) 10-12.5 MG tablet TAKE 1 TABLET DAILY 07/15/18   Wendie Agreste, MD  Pitavastatin Calcium (LIVALO) 1 MG TABS Take 1 tablet (1 mg total) by mouth daily. 08/27/18   Hilty, Nadean Corwin, MD  Polyethyl Glycol-Propyl Glycol (  SYSTANE ULTRA OP) Apply 2 drops to eye daily.    [provider]  Tiotropium Bromide Monohydrate (SPIRIVA RESPIMAT) 2.5 MCG/ACT AERS Take 2 puffs by mouth daily. 01/20/19   Wendie Agreste, MD  Travoprost, BAK Free, (TRAVATAN) 0.004 % SOLN ophthalmic solution Place 1 drop into both eyes at bedtime.    [provider]    Family History Family History  Problem Relation Age of Onset  . Hypertension Mother        dx in her 27's  . Heart attack Mother 64  . Breast cancer Mother        unsure of age  . Heart attack Brother 21  . Diabetes Brother   .  Hyperlipidemia Brother   . Hypertension Brother   . Heart disease Brother   . Colon cancer Neg Hx   . Esophageal cancer Neg Hx   . Rectal cancer Neg Hx   . Stomach cancer Neg Hx     Social History Social History   Tobacco Use  . Smoking status: Former Smoker    Packs/day: 0.50    Years: 32.00    Pack years: 16.00    Types: Cigarettes    Last attempt to quit: 11/27/2002    Years since quitting: 16.2  . Smokeless tobacco: Never Used  Substance Use Topics  . Alcohol use: Yes    Alcohol/week: 3.0 standard drinks    Types: 3 Standard drinks or equivalent per week    Comment: social - 2 or 3 times a month 1-2 glasses of wine  . Drug use: No     Allergies   Statins   Review of Systems Review of Systems  Constitutional: Negative for chills and fever.  Musculoskeletal: Positive for arthralgias.  Psychiatric/Behavioral: The patient is nervous/anxious.      Physical Exam Updated Vital Signs BP (!) 170/77 (BP Location: Right Arm)   Pulse 82   Temp 97.9 F (36.6 C) (Oral)   Resp 14   Ht 5' 3.5" (1.613 m)   Wt 65.8 kg   SpO2 100%   BMI 25.28 kg/m   Physical Exam Vitals signs and nursing note reviewed.  Constitutional:      General: She is not in acute distress.    Appearance: She is well-developed.     Comments: Non ill appearing N95 in place.   HENT:     Head: Normocephalic and atraumatic.     Right Ear: Tympanic membrane normal. There is no impacted cerumen.     Left Ear: Tympanic membrane normal. There is no impacted cerumen.     Ears:     Comments: Significant amount of cerumen on bilateral ears.  Right worse than left.    Nose: Congestion present. No rhinorrhea.     Mouth/Throat:     Pharynx: No oropharyngeal exudate.  Eyes:     Pupils: Pupils are equal, round, and reactive to light.  Neck:     Musculoskeletal: Normal range of motion.  Cardiovascular:     Rate and Rhythm: Regular rhythm.     Heart sounds: Normal heart sounds.  Pulmonary:     Effort:  Pulmonary effort is normal. No respiratory distress.     Breath sounds: Normal breath sounds.     Comments: Lungs are clear to auscultation.  No rhonchi, wheezing, rales. Abdominal:     General: Bowel sounds are normal. There is no distension.     Palpations: Abdomen is soft.     Tenderness: There is no  abdominal tenderness.  Musculoskeletal:        General: No tenderness or deformity.     Right lower leg: No edema.     Left lower leg: No edema.  Skin:    General: Skin is warm and dry.  Neurological:     Mental Status: She is alert and oriented to person, place, and time.      ED Treatments / Results  Labs (all labs ordered are listed, but only abnormal results are displayed) Labs Reviewed - No data to display  EKG None  Radiology Dg Hand 2 View Right  Result Date: 03/11/2019 CLINICAL DATA:  68 year old female with a history of middle finger injury and pain EXAM: RIGHT HAND - 2 VIEW COMPARISON:  None. FINDINGS: No subluxation/dislocation. Fracture of osteophyte at the base of the distal phalanx of the third digit right hand. Associated soft tissue swelling. Osteoarthritis of the interphalangeal joints throughout the hand. Degenerative changes at the first carpometacarpal joint. No erosive changes. No radiopaque foreign body. IMPRESSION: Acute fracture involving the osteophyte at the base of the distal phalanx of the third digit right hand. Osteoarthritis. Electronically Signed   By: Corrie Mckusick D.O.   On: 03/11/2019 10:48    Procedures Procedures (including critical care time)  Medications Ordered in ED Medications - No data to display   Initial Impression / Assessment and Plan / ED Course  I have reviewed the triage vital signs and the nursing notes.  Pertinent labs & imaging results that were available during my care of the patient were reviewed by me and considered in my medical decision making (see chart for details).      Patient with a previous history of  hypertension, vertigo presents to the ED with multiple complaints.  First complaint been feeling nervous, unease.  She does have a previous history of anxiety, reports feeling just like it, reports this is made better by multiple tasks while at home.  Reports she currently lives with her husband, has been isolated for a while at home, does walk around the neighborhood which helps with her symptoms.  She denies any dizziness, chest pain, shortness of breath.  Her second complaint is right ear wax, this is chronic for patient and she currently has multiple ENT visits for removal of cerumen.  She reports water irrigation does not work with her ears, did not attempt this in the ED as she reports this makes it worse with her vertigo, does not relieve the cerumen impaction.  Ears are overall well-appearing, significant amount of cerumen to bilateral ears.  Hearing still present.  She reports her ENT office usually removes this for her, she will schedule an appointment to follow-up with them.   Her third complaint includes a finger injury 3 weeks ago of the right middle finger.  An x-ray was obtained to rule out any fracture.  X-ray showed an acute fracture involving the osteophyte at the base of the distal phalanx of the third digit on the right hand.  Patient was placed on a splint for comfort, she will be given Dr. Amedeo Plenty phone number for follow-up.  She reports she will be following up with him.  I have spoken to patient about this likely being anxiety, feeling unease patient reports she will try walking, wine, face timing and calling friends to help.  No medical therapy will be started at this time as patient was never placed on medical therapy prior to.  Patient understands and agrees with my management.  Return precautions provided, with stable vital signs.  Stable discharge home.  Portions of this note were generated with [employer]. Dictation errors may occur despite best attempts at  proofreading.     Final Clinical Impressions(s) / ED Diagnoses   Final diagnoses:  Injury of finger of right hand, subsequent encounter  Chronic otitis media of right ear    ED Discharge Orders    None       Janeece Fitting, PA-C 03/11/19 1111    Sherwood Gambler, MD 03/11/19 1540

## 2019-03-12 DIAGNOSIS — M79644 Pain in right finger(s): Secondary | ICD-10-CM | POA: Diagnosis not present

## 2019-03-12 DIAGNOSIS — S62632A Displaced fracture of distal phalanx of right middle finger, initial encounter for closed fracture: Secondary | ICD-10-CM | POA: Diagnosis not present

## 2019-04-02 DIAGNOSIS — M79644 Pain in right finger(s): Secondary | ICD-10-CM | POA: Diagnosis not present

## 2019-04-02 DIAGNOSIS — S62632D Displaced fracture of distal phalanx of right middle finger, subsequent encounter for fracture with routine healing: Secondary | ICD-10-CM | POA: Diagnosis not present

## 2019-04-02 DIAGNOSIS — M20011 Mallet finger of right finger(s): Secondary | ICD-10-CM | POA: Diagnosis not present

## 2019-04-07 ENCOUNTER — Telehealth (INDEPENDENT_AMBULATORY_CARE_PROVIDER_SITE_OTHER): Payer: Medicare Other | Admitting: Family Medicine

## 2019-04-07 ENCOUNTER — Other Ambulatory Visit: Payer: Self-pay

## 2019-04-07 DIAGNOSIS — Z8719 Personal history of other diseases of the digestive system: Secondary | ICD-10-CM | POA: Diagnosis not present

## 2019-04-07 DIAGNOSIS — Z8711 Personal history of peptic ulcer disease: Secondary | ICD-10-CM

## 2019-04-07 DIAGNOSIS — K30 Functional dyspepsia: Secondary | ICD-10-CM

## 2019-04-07 NOTE — Progress Notes (Signed)
CC- indigestion- Had some heart burn so went to the pharmacy and they told me to get Prilosec. Been taking Prilosec for 9 days now. Was doing good but then yesterday and today had trouble with indigestion again. Mostly have the indigestion during lunch and dinner. As we were talking patient stated she eats a orange every day. Was informed that the oranges do have a lot of acid in them.Some of the symptoms stated she was having was indigestion then feel better after a ginger ale. No pain, no pressure  in chest, no arm pain. Denies anything to do with heart at all. Denies acid reflex and never dx with acid reflex. Do not get that acid that come back in the throat. Think The indigestion may be coming from the orange everyday.

## 2019-04-07 NOTE — Patient Instructions (Addendum)
Stop eating oranges for now and avoid other foods as listed below that can worsen heartburn. Increase omeprazole(Prilosec) to twice per day for now. Avoid ibuprofen and other nsaids for now.  I will refer you to gastroenterology, so they should be calling you within the next week to 10 days.  If any increasing abdominal pain, lightheadedness or dizziness, dark or black stools, vomiting, or other acute worsening, be seen in the emergency room.  Thank you for taking my call today.  Take care.   Food Choices for Gastroesophageal Reflux Disease, Adult When you have gastroesophageal reflux disease (GERD), the foods you eat and your eating habits are very important. Choosing the right foods can help ease your discomfort. Think about working with a nutrition specialist (dietitian) to help you make good choices. What are tips for following this plan?  Meals  Choose healthy foods that are low in fat, such as fruits, vegetables, whole grains, low-fat dairy products, and lean meat, fish, and poultry.  Eat small meals often instead of 3 large meals a day. Eat your meals slowly, and in a place where you are relaxed. Avoid bending over or lying down until 2-3 hours after eating.  Avoid eating meals 2-3 hours before bed.  Avoid drinking a lot of liquid with meals.  Cook foods using methods other than frying. Bake, grill, or broil food instead.  Avoid or limit: ? Chocolate. ? Peppermint or spearmint. ? Alcohol. ? Pepper. ? Black and decaffeinated coffee. ? Black and decaffeinated tea. ? Bubbly (carbonated) soft drinks. ? Caffeinated energy drinks and soft drinks.  Limit high-fat foods such as: ? Fatty meat or fried foods. ? Whole milk, cream, butter, or ice cream. ? Nuts and nut butters. ? Pastries, donuts, and sweets made with butter or shortening.  Avoid foods that cause symptoms. These foods may be different for everyone. Common foods that cause symptoms include: ? Tomatoes. ? Oranges,  lemons, and limes. ? Peppers. ? Spicy food. ? Onions and garlic. ? Vinegar. Lifestyle  Maintain a healthy weight. Ask your doctor what weight is healthy for you. If you need to lose weight, work with your doctor to do so safely.  Exercise for at least 30 minutes for 5 or more days each week, or as told by your doctor.  Wear loose-fitting clothes.  Do not smoke. If you need help quitting, ask your doctor.  Sleep with the head of your bed higher than your feet. Use a wedge under the mattress or blocks under the bed frame to raise the head of the bed. Summary  When you have gastroesophageal reflux disease (GERD), food and lifestyle choices are very important in easing your symptoms.  Eat small meals often instead of 3 large meals a day. Eat your meals slowly, and in a place where you are relaxed.  Limit high-fat foods such as fatty meat or fried foods.  Avoid bending over or lying down until 2-3 hours after eating.  Avoid peppermint and spearmint, caffeine, alcohol, and chocolate. This information is not intended to replace advice given to you by your health care provider. Make sure you discuss any questions you have with your health care provider. Document Released: 05/14/2012 Document Revised: 12/19/2016 Document Reviewed: 12/19/2016 Elsevier Interactive Patient Education  [employer].    If you have lab work done today you will be contacted with your lab results within the next 2 weeks.  If you have not heard from [place] then please contact us. The fastest way  to get your results is to register for My Chart.   IF you received an x-ray today, you will receive an invoice from [facility]. Please contact [facility] at [phone] with questions or concerns regarding your invoice.   IF you received labwork today, you will receive an invoice from [facility]. Please contact [facility] at [phone] with questions or concerns regarding your invoice.   Our billing  staff will not be able to assist you with questions regarding bills from these companies.  You will be contacted with the lab results as soon as they are available. The fastest way to get your results is to activate your My Chart account. Instructions are located on the last page of this paperwork. If you have not heard from [place] regarding the results in 2 weeks, please contact this office.

## 2019-04-07 NOTE — Progress Notes (Signed)
Virtual Visit via phone note  I connected with Breanna Sharp on 04/07/19 at 4:01 PM by a video enabled telemedicine application and verified that I am speaking with the correct person using two identifiers.   I discussed the limitations, risks, security and privacy concerns of performing an evaluation and management service by telephone and the availability of in person appointments. I also discussed with the patient that there may be a patient responsible charge related to this service. The patient expressed understanding and agreed to proceed, consent obtained  Chief complaint:  indigestion  History of Present Illness: Breanna Sharp is a 68 y.o. female  Indigestion/heartburn.  Past 3 weeks.  Notes more at lunch. Eats one orange daily each morning, but has been doing this for 7-8 months.  Tried tums - min relief.  Started prilosec 9 days ago - working fine until last 3 days when it returned. Does feel like getting full quicker. No vomiting.  No bad taste.  Ginger ale feels better.  Swallowing ok.   Remote history of gastric ulcer in 2009, no problem since that time.  Has been taking tylenol arthritis usually. Took advil for a few days for finger injury past week or so, but none in past 2 days.  No dark/bloody stools.  Stomach bloated at times, but not painful.  No chest/arm pain.  No unexplained weight loss, night sweats or fever.  Prior GI: Nandigam - colonoscopy in 2018.    Patient Active Problem List   Diagnosis Date Noted  . Epidemic vertigo 01/30/2018  . COPD, group A, by GOLD 2017 classification ([place]) 06/06/2017  . Dyslipidemia 05/11/2016  . Shortness of breath 05/11/2016  . Essential hypertension 05/11/2016  . Osteopenia 10/12/2015  . DOE (dyspnea on exertion) 05/02/2013  . Chronic sinusitis 05/02/2013  . Vertigo 08/14/2012  . Benign essential HTN 08/14/2012  . BMI 28.0-28.9,adult 08/14/2012  . COLONIC POLYPS 01/24/2008  . ACUT GASTR ULCER W/O MENTION  HEMORR PERF/OBST 01/24/2008   Past Medical History:  Diagnosis Date  . Arthritis   . Dyslipidemia    statin intolerance  . Hypertension   . Vertigo    Past Surgical History:  Procedure Laterality Date  . ABDOMINAL HYSTERECTOMY  1972   cervical cancer  . COLONOSCOPY    . WISDOM TOOTH EXTRACTION     uppers   Allergies  Allergen Reactions  . Statins     Muscle cramps   Prior to Admission medications   Medication Sig Start Date End Date Taking? Authorizing Provider  calcium carbonate (TUMS EX) 750 MG chewable tablet Chew 1 tablet by mouth daily.   Yes [provider]  cholecalciferol (VITAMIN D) 1000 UNITS tablet Take 2,000 Units by mouth daily.    Yes [provider]  ezetimibe (ZETIA) 10 MG tablet Take 1 tablet (10 mg total) by mouth daily. 12/02/18  Yes Hilty, Nadean Corwin, MD  fexofenadine (ALLEGRA) 180 MG tablet Take by mouth. 10/04/13  Yes [provider]  fluticasone (FLONASE) 50 MCG/ACT nasal spray Place 2 sprays into both nostrils daily. 07/15/18  Yes Wendie Agreste, MD  lisinopril-hydrochlorothiazide (PRINZIDE,ZESTORETIC) 10-12.5 MG tablet TAKE 1 TABLET DAILY 07/15/18  Yes Wendie Agreste, MD  Pitavastatin Calcium (LIVALO) 1 MG TABS Take 1 tablet (1 mg total) by mouth daily. 08/27/18  Yes Hilty, Nadean Corwin, MD  Polyethyl Glycol-Propyl Glycol (SYSTANE ULTRA OP) Apply 2 drops to eye daily.   Yes [provider]  Tiotropium Bromide Monohydrate (SPIRIVA RESPIMAT) 2.5 MCG/ACT AERS  Take 2 puffs by mouth daily. 01/20/19  Yes Wendie Agreste, MD  Travoprost, BAK Free, (TRAVATAN) 0.004 % SOLN ophthalmic solution Place 1 drop into both eyes at bedtime.   Yes [provider]   Social History   Socioeconomic History  . Marital status: Married    Spouse name: [name]  . Number of children: 0  . Years of education: 58  . Highest education level: Not on file  Occupational History    Employer: RETIRED    Comment: Retired  Scientific laboratory technician  .  Financial resource strain: Not on file  . Food insecurity:    Worry: Not on file    Inability: Not on file  . Transportation needs:    Medical: Not on file    Non-medical: Not on file  Tobacco Use  . Smoking status: Former Smoker    Packs/day: 0.50    Years: 32.00    Pack years: 16.00    Types: Cigarettes    Last attempt to quit: 11/27/2002    Years since quitting: 16.3  . Smokeless tobacco: Never Used  Substance and Sexual Activity  . Alcohol use: Yes    Alcohol/week: 3.0 standard drinks    Types: 3 Standard drinks or equivalent per week    Comment: social - 2 or 3 times a month 1-2 glasses of wine  . Drug use: No  . Sexual activity: Yes    Partners: Male  Lifestyle  . Physical activity:    Days per week: Not on file    Minutes per session: Not on file  . Stress: Not on file  Relationships  . Social connections:    Talks on phone: Not on file    Gets together: Not on file    Attends religious service: Not on file    Active member of club or organization: Not on file    Attends meetings of clubs or organizations: Not on file    Relationship status: Not on file  . Intimate partner violence:    Fear of current or ex partner: Not on file    Emotionally abused: Not on file    Physically abused: Not on file    Forced sexual activity: Not on file  Other Topics Concern  . Not on file  Social History Narrative   Exercise 3 to 4 times/week walking for 45 min -1 hour   Patient lives at home with her husband Legrand Como).   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   Right handed.   decaffeine Green Tea.    Observations/Objective: Speaking normally, no distress.  Appropriate responses.  Assessment and Plan: History of gastric ulcer - Plan: Ambulatory referral to Gastroenterology  Indigestion - Plan: Ambulatory referral to Gastroenterology  Increase indigestion, suspected reflux but  remote history of gastric ulcer, may have been related to NSAID at that time.  Has been using ibuprofen intermittently recently.  None in the past few days.  Denies red flag symptoms as above.  -Increase omeprazole to twice per day, trigger avoidance with handout given, avoid nsaids, refer to GI with ER precautions if worse sooner.  Follow Up Instructions: With GI. And ER precautions.    Patient Instructions   Stop eating oranges for now and avoid other foods as listed below that can worsen heartburn. Increase omeprazole(Prilosec) to twice per day for now. I will refer you to gastroenterology, so they should be  calling you within the next week to 10 days.  If any increasing abdominal pain, lightheadedness or dizziness, dark or black stools, vomiting, or other acute worsening, be seen in the emergency room.  Thank you for taking my call today.  Take care.   Food Choices for Gastroesophageal Reflux Disease, Adult When you have gastroesophageal reflux disease (GERD), the foods you eat and your eating habits are very important. Choosing the right foods can help ease your discomfort. Think about working with a nutrition specialist (dietitian) to help you make good choices. What are tips for following this plan?  Meals  Choose healthy foods that are low in fat, such as fruits, vegetables, whole grains, low-fat dairy products, and lean meat, fish, and poultry.  Eat small meals often instead of 3 large meals a day. Eat your meals slowly, and in a place where you are relaxed. Avoid bending over or lying down until 2-3 hours after eating.  Avoid eating meals 2-3 hours before bed.  Avoid drinking a lot of liquid with meals.  Cook foods using methods other than frying. Bake, grill, or broil food instead.  Avoid or limit: ? Chocolate. ? Peppermint or spearmint. ? Alcohol. ? Pepper. ? Black and decaffeinated coffee. ? Black and decaffeinated tea. ? Bubbly (carbonated) soft drinks. ?  Caffeinated energy drinks and soft drinks.  Limit high-fat foods such as: ? Fatty meat or fried foods. ? Whole milk, cream, butter, or ice cream. ? Nuts and nut butters. ? Pastries, donuts, and sweets made with butter or shortening.  Avoid foods that cause symptoms. These foods may be different for everyone. Common foods that cause symptoms include: ? Tomatoes. ? Oranges, lemons, and limes. ? Peppers. ? Spicy food. ? Onions and garlic. ? Vinegar. Lifestyle  Maintain a healthy weight. Ask your doctor what weight is healthy for you. If you need to lose weight, work with your doctor to do so safely.  Exercise for at least 30 minutes for 5 or more days each week, or as told by your doctor.  Wear loose-fitting clothes.  Do not smoke. If you need help quitting, ask your doctor.  Sleep with the head of your bed higher than your feet. Use a wedge under the mattress or blocks under the bed frame to raise the head of the bed. Summary  When you have gastroesophageal reflux disease (GERD), food and lifestyle choices are very important in easing your symptoms.  Eat small meals often instead of 3 large meals a day. Eat your meals slowly, and in a place where you are relaxed.  Limit high-fat foods such as fatty meat or fried foods.  Avoid bending over or lying down until 2-3 hours after eating.  Avoid peppermint and spearmint, caffeine, alcohol, and chocolate. This information is not intended to replace advice given to you by your health care provider. Make sure you discuss any questions you have with your health care provider. Document Released: 05/14/2012 Document Revised: 12/19/2016 Document Reviewed: 12/19/2016 Elsevier Interactive Patient Education  [employer].    If you have lab work done today you will be contacted with your lab results within the next 2 weeks.  If you have not heard from [place] then please contact us. The fastest way to get your results is to register for My  Chart.   IF you received an x-ray today, you will receive an invoice from [facility]. Please contact [facility] at [phone] with questions or concerns regarding your invoice.  IF you received labwork today, you will receive an invoice from [facility]. Please contact [facility] at [phone] with questions or concerns regarding your invoice.   Our billing staff will not be able to assist you with questions regarding bills from these companies.  You will be contacted with the lab results as soon as they are available. The fastest way to get your results is to activate your My Chart account. Instructions are located on the last page of this paperwork. If you have not heard from [place] regarding the results in 2 weeks, please contact this office.         I discussed the assessment and treatment plan with the patient. The patient was provided an opportunity to ask questions and all were answered. The patient agreed with the plan and demonstrated an understanding of the instructions.   The patient was advised to call back or seek an in-person evaluation if the symptoms worsen or if the condition fails to improve as anticipated.  I provided 16 minutes of non-face-to-face time during this encounter.   Wendie Agreste, MD

## 2019-04-09 ENCOUNTER — Encounter: Payer: Self-pay | Admitting: General Surgery

## 2019-04-10 ENCOUNTER — Ambulatory Visit (INDEPENDENT_AMBULATORY_CARE_PROVIDER_SITE_OTHER): Payer: Medicare Other | Admitting: Gastroenterology

## 2019-04-10 ENCOUNTER — Encounter: Payer: Self-pay | Admitting: Gastroenterology

## 2019-04-10 ENCOUNTER — Other Ambulatory Visit: Payer: Self-pay

## 2019-04-10 VITALS — Ht 64.0 in | Wt 145.0 lb

## 2019-04-10 DIAGNOSIS — K219 Gastro-esophageal reflux disease without esophagitis: Secondary | ICD-10-CM

## 2019-04-10 DIAGNOSIS — R140 Abdominal distension (gaseous): Secondary | ICD-10-CM

## 2019-04-10 DIAGNOSIS — R12 Heartburn: Secondary | ICD-10-CM

## 2019-04-10 DIAGNOSIS — K581 Irritable bowel syndrome with constipation: Secondary | ICD-10-CM

## 2019-04-10 MED ORDER — OMEPRAZOLE 20 MG PO TBEC: 2.0000 | DELAYED_RELEASE_TABLET | Freq: Every day | ORAL | 3 refills | Status: DC

## 2019-04-10 NOTE — Patient Instructions (Addendum)
Omeprazole 20mg  twice daily, 30 minutes before breakfast and dinner X 90 days supply ( Prescription sent to your pharmacy)   Antireflux measures  Continue high fiber diet and increase fluid intake to 8 to 10 cups daily  Follow-up telemedicine visit in 6 to 8 weeks   Gastroesophageal Reflux Disease, Adult Gastroesophageal reflux (GER) happens when acid from the stomach flows up into the tube that connects the mouth and the stomach (esophagus). Normally, food travels down the esophagus and stays in the stomach to be digested. However, when a person has GER, food and stomach acid sometimes move back up into the esophagus. If this becomes a more serious problem, the person may be diagnosed with a disease called gastroesophageal reflux disease (GERD). GERD occurs when the reflux:  Happens often.  Causes frequent or severe symptoms.  Causes problems such as damage to the esophagus. When stomach acid comes in contact with the esophagus, the acid may cause soreness (inflammation) in the esophagus. Over time, GERD may create small holes (ulcers) in the lining of the esophagus. What are the causes? This condition is caused by a problem with the muscle between the esophagus and the stomach (lower esophageal sphincter, or LES). Normally, the LES muscle closes after food passes through the esophagus to the stomach. When the LES is weakened or abnormal, it does not close properly, and that allows food and stomach acid to go back up into the esophagus. The LES can be weakened by certain dietary substances, medicines, and medical conditions, including:  Tobacco use.  Pregnancy.  Having a hiatal hernia.  Alcohol use.  Certain foods and beverages, such as coffee, chocolate, onions, and peppermint. What increases the risk? You are more likely to develop this condition if you:  Have an increased body weight.  Have a connective tissue disorder.  Use NSAID medicines. What are the signs or  symptoms? Symptoms of this condition include:  Heartburn.  Difficult or painful swallowing.  The feeling of having a lump in the throat.  Abitter taste in the mouth.  Bad breath.  Having a large amount of saliva.  Having an upset or bloated stomach.  Belching.  Chest pain. Different conditions can cause chest pain. Make sure you see your health care provider if you experience chest pain.  Shortness of breath or wheezing.  Ongoing (chronic) cough or a night-time cough.  Wearing away of tooth enamel.  Weight loss. How is this diagnosed? Your health care provider will take a medical history and perform a physical exam. To determine if you have mild or severe GERD, your health care provider may also monitor how you respond to treatment. You may also have tests, including:  A test to examine your stomach and esophagus with a small camera (endoscopy).  A test thatmeasures the acidity level in your esophagus.  A test thatmeasures how much pressure is on your esophagus.  A barium swallow or modified barium swallow test to show the shape, size, and functioning of your esophagus. How is this treated? The goal of treatment is to help relieve your symptoms and to prevent complications. Treatment for this condition may vary depending on how severe your symptoms are. Your health care provider may recommend:  Changes to your diet.  Medicine.  Surgery. Follow these instructions at home: Eating and drinking   Follow a diet as recommended by your health care provider. This may involve avoiding foods and drinks such as: ? Coffee and tea (with or without caffeine). ? Drinks that  containalcohol. ? Energy drinks and sports drinks. ? Carbonated drinks or sodas. ? Chocolate and cocoa. ? Peppermint and mint flavorings. ? Garlic and onions. ? Horseradish. ? Spicy and acidic foods, including peppers, chili powder, curry powder, vinegar, hot sauces, and barbecue sauce. ? Citrus  fruit juices and citrus fruits, such as oranges, lemons, and limes. ? Tomato-based foods, such as red sauce, chili, salsa, and pizza with red sauce. ? Fried and fatty foods, such as donuts, french fries, potato chips, and high-fat dressings. ? High-fat meats, such as hot dogs and fatty cuts of red and white meats, such as rib eye steak, sausage, ham, and bacon. ? High-fat dairy items, such as whole milk, butter, and cream cheese.  Eat small, frequent meals instead of large meals.  Avoid drinking large amounts of liquid with your meals.  Avoid eating meals during the 2-3 hours before bedtime.  Avoid lying down right after you eat.  Do not exercise right after you eat. Lifestyle   Do not use any products that contain nicotine or tobacco, such as cigarettes, e-cigarettes, and chewing tobacco. If you need help quitting, ask your health care provider.  Try to reduce your stress by using methods such as yoga or meditation. If you need help reducing stress, ask your health care provider.  If you are overweight, reduce your weight to an amount that is healthy for you. Ask your health care provider for guidance about a safe weight loss goal. General instructions  Pay attention to any changes in your symptoms.  Take over-the-counter and prescription medicines only as told by your health care provider. Do not take aspirin, ibuprofen, or other NSAIDs unless your health care provider told you to do so.  Wear loose-fitting clothing. Do not wear anything tight around your waist that causes pressure on your abdomen.  Raise (elevate) the head of your bed about 6 inches (15 cm).  Avoid bending over if this makes your symptoms worse.  Keep all follow-up visits as told by your health care provider. This is important. Contact a health care provider if:  You have: ? New symptoms. ? Unexplained weight loss. ? Difficulty swallowing or it hurts to swallow. ? Wheezing or a persistent cough. ? A  hoarse voice.  Your symptoms do not improve with treatment. Get help right away if you:  Have pain in your arms, neck, jaw, teeth, or back.  Feel sweaty, dizzy, or light-headed.  Have chest pain or shortness of breath.  Vomit and your vomit looks like blood or coffee grounds.  Faint.  Have stool that is bloody or black.  Cannot swallow, drink, or eat. Summary  Gastroesophageal reflux happens when acid from the stomach flows up into the esophagus. GERD is a disease in which the reflux happens often, causes frequent or severe symptoms, or causes problems such as damage to the esophagus.  Treatment for this condition may vary depending on how severe your symptoms are. Your health care provider may recommend diet and lifestyle changes, medicine, or surgery.  Contact a health care provider if you have new or worsening symptoms.  Take over-the-counter and prescription medicines only as told by your health care provider. Do not take aspirin, ibuprofen, or other NSAIDs unless your health care provider told you to do so.  Keep all follow-up visits as told by your health care provider. This is important. This information is not intended to replace advice given to you by your health care provider. Make sure you discuss any questions  you have with your health care provider. Document Released: 08/23/2005 Document Revised: 05/22/2018 Document Reviewed: 05/22/2018 Elsevier Interactive Patient Education  2019 [employer].

## 2019-04-10 NOTE — Progress Notes (Signed)
Breanna Sharp    [id number]    12-09-1950  Primary Care Physician:Greene, Ranell Patrick, MD  Referring Physician: Wendie Agreste, MD [address] [place], [place] [address]  This service was provided via audio and video telemedicine (Doximity) due to [place] 19 pandemic.  Patient location: Home Provider location: Office Used 2 patient identifiers to confirm the correct person. Explained the limitations in evaluation and management via telemedicine. Patient is aware of potential medical charges for this visit.  Patient consented to this virtual visit.  The persons participating in this telemedicine service were myself and the patient   Chief complaint: Bloating  HPI:  68 year old history of hypertension  with complaints of bloating  She feels full, bloated associated with indigestion almost everyday  Heartburn and chocking, improved with Prilosec, now taking twice daily.   In Feb had an episode of stomach cramps, constipation with some small volume BRBPR, also had had vomiting, chills during defecation, has since resolved.  Now bowel habits regular with 1-2 BM per day  Colonoscopy 09/12/2017: Sessile polyps, left side diverticulosis  Outpatient Encounter Medications as of 04/10/2019  Medication Sig  . calcium carbonate (TUMS EX) 750 MG chewable tablet Chew 1 tablet by mouth daily.  . cholecalciferol (VITAMIN D) 1000 UNITS tablet Take 2,000 Units by mouth daily.   [name] ezetimibe (ZETIA) 10 MG tablet Take 1 tablet (10 mg total) by mouth daily.  . fexofenadine (ALLEGRA) 180 MG tablet Take by mouth.  . fluticasone (FLONASE) 50 MCG/ACT nasal spray Place 2 sprays into both nostrils daily.  [name] lisinopril-hydrochlorothiazide (PRINZIDE,ZESTORETIC) 10-12.5 MG tablet TAKE 1 TABLET DAILY  . Pitavastatin Calcium (LIVALO) 1 MG TABS Take 1 tablet (1 mg total) by mouth daily.  Vladimir Faster Glycol-Propyl Glycol (SYSTANE ULTRA OP) Apply 2 drops to eye daily.  . Tiotropium Bromide  Monohydrate (SPIRIVA RESPIMAT) 2.5 MCG/ACT AERS Take 2 puffs by mouth daily.  . Travoprost, BAK Free, (TRAVATAN) 0.004 % SOLN ophthalmic solution Place 1 drop into both eyes at bedtime.   Facility-Administered Encounter Medications as of 04/10/2019  Medication  . 0.9 %  sodium chloride infusion    Allergies as of 04/10/2019 - Review Complete 04/09/2019  Allergen Reaction Noted  . Statins  05/15/2012    Past Medical History:  Diagnosis Date  . Arthritis   . Dyslipidemia    statin intolerance  . Hypertension   . Vertigo     Past Surgical History:  Procedure Laterality Date  . ABDOMINAL HYSTERECTOMY  1972   cervical cancer  . COLONOSCOPY    . WISDOM TOOTH EXTRACTION     uppers    Family History  Problem Relation Age of Onset  . Hypertension Mother        dx in her 22's  . Heart attack Mother 20  . Breast cancer Mother        unsure of age  . Heart attack Brother 39  . Diabetes Brother   . Hyperlipidemia Brother   . Hypertension Brother   . Heart disease Brother   . Colon cancer Neg Hx   . Esophageal cancer Neg Hx   . Rectal cancer Neg Hx   . Stomach cancer Neg Hx     Social History   Socioeconomic History  . Marital status: Married    Spouse name: [name]  . Number of children: 0  . Years of education: 67  . Highest education level: Not on file  Occupational History  Employer: RETIRED    Comment: Retired  Scientific laboratory technician  . Financial resource strain: Not on file  . Food insecurity:    Worry: Not on file    Inability: Not on file  . Transportation needs:    Medical: Not on file    Non-medical: Not on file  Tobacco Use  . Smoking status: Former Smoker    Packs/day: 0.50    Years: 32.00    Pack years: 16.00    Types: Cigarettes    Last attempt to quit: 11/27/2002    Years since quitting: 16.3  . Smokeless tobacco: Never Used  Substance and Sexual Activity  . Alcohol use: Yes    Alcohol/week: 3.0 standard drinks    Types: 3 Standard drinks or  equivalent per week    Comment: social - 2 or 3 times a month 1-2 glasses of wine  . Drug use: No  . Sexual activity: Yes    Partners: Male  Lifestyle  . Physical activity:    Days per week: Not on file    Minutes per session: Not on file  . Stress: Not on file  Relationships  . Social connections:    Talks on phone: Not on file    Gets together: Not on file    Attends religious service: Not on file    Active member of club or organization: Not on file    Attends meetings of clubs or organizations: Not on file    Relationship status: Not on file  . Intimate partner violence:    Fear of current or ex partner: Not on file    Emotionally abused: Not on file    Physically abused: Not on file    Forced sexual activity: Not on file  Other Topics Concern  . Not on file  Social History Narrative   Exercise 3 to 4 times/week walking for 45 min -1 hour   Patient lives at home with her husband Legrand Como).   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   Right handed.   decaffeine Green Tea.      Review of systems: Review of Systems as per HPI All other systems reviewed and are negative.   Physical Exam: Vitals were not taken and physical exam was not performed during this virtual visit.  Data Reviewed:  Reviewed labs, radiology imaging, old records and pertinent past GI work up   Assessment and Plan/Recommendations: 68 year old female with complaints of bloating, indigestion, heartburn and irritable bowel syndrome predominant constipation  Increase omeprazole 20 mg twice daily, 30 minutes before breakfast and dinner Antireflux measures  Increase dietary fiber and water intake to 8 to 10 cups daily  Follow-up tele visit in 6 to 8 weeks    K. Denzil Magnuson , MD   CC: Wendie Agreste, MD

## 2019-04-15 DIAGNOSIS — M542 Cervicalgia: Secondary | ICD-10-CM | POA: Diagnosis not present

## 2019-04-18 DIAGNOSIS — M542 Cervicalgia: Secondary | ICD-10-CM | POA: Diagnosis not present

## 2019-04-22 DIAGNOSIS — M542 Cervicalgia: Secondary | ICD-10-CM | POA: Diagnosis not present

## 2019-04-30 DIAGNOSIS — M542 Cervicalgia: Secondary | ICD-10-CM | POA: Diagnosis not present

## 2019-05-05 ENCOUNTER — Other Ambulatory Visit: Payer: Self-pay | Admitting: Internal Medicine

## 2019-05-07 DIAGNOSIS — M542 Cervicalgia: Secondary | ICD-10-CM | POA: Diagnosis not present

## 2019-05-09 DIAGNOSIS — M9902 Segmental and somatic dysfunction of thoracic region: Secondary | ICD-10-CM | POA: Diagnosis not present

## 2019-05-09 DIAGNOSIS — M9901 Segmental and somatic dysfunction of cervical region: Secondary | ICD-10-CM | POA: Diagnosis not present

## 2019-05-09 DIAGNOSIS — M531 Cervicobrachial syndrome: Secondary | ICD-10-CM | POA: Diagnosis not present

## 2019-05-09 DIAGNOSIS — M5134 Other intervertebral disc degeneration, thoracic region: Secondary | ICD-10-CM | POA: Diagnosis not present

## 2019-05-12 ENCOUNTER — Telehealth: Payer: Self-pay | Admitting: Internal Medicine

## 2019-05-12 DIAGNOSIS — M5134 Other intervertebral disc degeneration, thoracic region: Secondary | ICD-10-CM | POA: Diagnosis not present

## 2019-05-12 DIAGNOSIS — M9901 Segmental and somatic dysfunction of cervical region: Secondary | ICD-10-CM | POA: Diagnosis not present

## 2019-05-12 DIAGNOSIS — M9902 Segmental and somatic dysfunction of thoracic region: Secondary | ICD-10-CM | POA: Diagnosis not present

## 2019-05-12 DIAGNOSIS — M531 Cervicobrachial syndrome: Secondary | ICD-10-CM | POA: Diagnosis not present

## 2019-05-12 DIAGNOSIS — M25641 Stiffness of right hand, not elsewhere classified: Secondary | ICD-10-CM | POA: Diagnosis not present

## 2019-05-12 MED ORDER — EZETIMIBE 10 MG PO TABS: 10.0000 mg | ORAL_TABLET | Freq: Every day | ORAL | 0 refills | Status: DC

## 2019-05-12 NOTE — Telephone Encounter (Signed)
New Message          *STAT* If patient is at the pharmacy, call can be transferred to refill team 1. Which medications need to be refilled? (please list name of each medication and dose if known) Zetia 10 mg  2. Which pharmacy/location (including street and city if local pharmacy) is medication to be sent to[facility]   3. Do they need a 30 day or 90 day supply? 90    Patient called to make an appointment for 09/30 and the schedule is not open yet pls advise.

## 2019-05-12 NOTE — Telephone Encounter (Signed)
Zetia has been refilled for the patient, 90 day supply with no refills. She will call back in a month to make her 12 month follow up.

## 2019-05-14 DIAGNOSIS — M9902 Segmental and somatic dysfunction of thoracic region: Secondary | ICD-10-CM | POA: Diagnosis not present

## 2019-05-14 DIAGNOSIS — M9901 Segmental and somatic dysfunction of cervical region: Secondary | ICD-10-CM | POA: Diagnosis not present

## 2019-05-14 DIAGNOSIS — M531 Cervicobrachial syndrome: Secondary | ICD-10-CM | POA: Diagnosis not present

## 2019-05-14 DIAGNOSIS — M5134 Other intervertebral disc degeneration, thoracic region: Secondary | ICD-10-CM | POA: Diagnosis not present

## 2019-05-16 DIAGNOSIS — M9902 Segmental and somatic dysfunction of thoracic region: Secondary | ICD-10-CM | POA: Diagnosis not present

## 2019-05-16 DIAGNOSIS — M5134 Other intervertebral disc degeneration, thoracic region: Secondary | ICD-10-CM | POA: Diagnosis not present

## 2019-05-16 DIAGNOSIS — M9901 Segmental and somatic dysfunction of cervical region: Secondary | ICD-10-CM | POA: Diagnosis not present

## 2019-05-16 DIAGNOSIS — M531 Cervicobrachial syndrome: Secondary | ICD-10-CM | POA: Diagnosis not present

## 2019-05-19 DIAGNOSIS — M79644 Pain in right finger(s): Secondary | ICD-10-CM | POA: Diagnosis not present

## 2019-05-19 DIAGNOSIS — M5134 Other intervertebral disc degeneration, thoracic region: Secondary | ICD-10-CM | POA: Diagnosis not present

## 2019-05-19 DIAGNOSIS — M20011 Mallet finger of right finger(s): Secondary | ICD-10-CM | POA: Diagnosis not present

## 2019-05-19 DIAGNOSIS — S62632D Displaced fracture of distal phalanx of right middle finger, subsequent encounter for fracture with routine healing: Secondary | ICD-10-CM | POA: Diagnosis not present

## 2019-05-19 DIAGNOSIS — M531 Cervicobrachial syndrome: Secondary | ICD-10-CM | POA: Diagnosis not present

## 2019-05-19 DIAGNOSIS — M9902 Segmental and somatic dysfunction of thoracic region: Secondary | ICD-10-CM | POA: Diagnosis not present

## 2019-05-19 DIAGNOSIS — M9901 Segmental and somatic dysfunction of cervical region: Secondary | ICD-10-CM | POA: Diagnosis not present

## 2019-05-20 ENCOUNTER — Telehealth: Payer: Self-pay | Admitting: Family Medicine

## 2019-05-20 DIAGNOSIS — E785 Hyperlipidemia, unspecified: Secondary | ICD-10-CM

## 2019-05-20 DIAGNOSIS — I10 Essential (primary) hypertension: Secondary | ICD-10-CM

## 2019-05-20 DIAGNOSIS — M25641 Stiffness of right hand, not elsewhere classified: Secondary | ICD-10-CM | POA: Diagnosis not present

## 2019-05-20 DIAGNOSIS — M7989 Other specified soft tissue disorders: Secondary | ICD-10-CM

## 2019-05-20 DIAGNOSIS — R42 Dizziness and giddiness: Secondary | ICD-10-CM

## 2019-05-20 NOTE — Telephone Encounter (Signed)
Pt dropped off CD from [place] wanting to hear back after he has a chance to review . Patient would like a call back on cell  [phone] FR

## 2019-05-21 ENCOUNTER — Telehealth: Payer: Self-pay | Admitting: Gastroenterology

## 2019-05-21 DIAGNOSIS — M9902 Segmental and somatic dysfunction of thoracic region: Secondary | ICD-10-CM | POA: Diagnosis not present

## 2019-05-21 DIAGNOSIS — M5134 Other intervertebral disc degeneration, thoracic region: Secondary | ICD-10-CM | POA: Diagnosis not present

## 2019-05-21 DIAGNOSIS — M531 Cervicobrachial syndrome: Secondary | ICD-10-CM | POA: Diagnosis not present

## 2019-05-21 DIAGNOSIS — M9901 Segmental and somatic dysfunction of cervical region: Secondary | ICD-10-CM | POA: Diagnosis not present

## 2019-05-21 NOTE — Telephone Encounter (Signed)
Pt states that Omeprazole 20mg  is not helping anymore as sxs have reoccurred. Pls call her.

## 2019-05-21 NOTE — Telephone Encounter (Signed)
Given persistent symptoms despite PPI, will plan to proceed with EGD for further evaluation.  Please schedule next available appointment for EGD.  Continue PPI and antireflux measures.  Thank you

## 2019-05-21 NOTE — Telephone Encounter (Signed)
Pt scheduled for previsit 05/23/19@1pm , EGD scheduled in the [facility] 05/26/19@9am . Pt aware of appts.

## 2019-05-21 NOTE — Telephone Encounter (Signed)
Pt states the omeprazole is not working anymore. Reports she is taking 40mg  in the am 53min prior to breakfast but she is having indigestion and the choking sensation again. Please advise.

## 2019-05-21 NOTE — Telephone Encounter (Signed)
Dr [name] I do not see this disc. Not sure if someone already placed in ur box. I did not see in your box when I looked. Just want to know if you have this or not.

## 2019-05-23 ENCOUNTER — Other Ambulatory Visit: Payer: Self-pay

## 2019-05-23 ENCOUNTER — Ambulatory Visit (INDEPENDENT_AMBULATORY_CARE_PROVIDER_SITE_OTHER): Payer: Medicare Other

## 2019-05-23 VITALS — Ht 63.5 in | Wt 142.0 lb

## 2019-05-23 DIAGNOSIS — K219 Gastro-esophageal reflux disease without esophagitis: Secondary | ICD-10-CM

## 2019-05-23 NOTE — Telephone Encounter (Signed)
Images noted - c spine XR. ? Possible calcification in left neck soft tissue.   History of vertigo/occasional dizziness.No anterior neck pain. No hx of CVA/TIA.   Carotid u/s ordered.   Scheduling - please schedule follow up appointment with me in next 2-3 weeks to review ultrasound and plan.

## 2019-05-23 NOTE — Progress Notes (Signed)
Denies allergies to eggs or soy products. Denies complication of anesthesia or sedation. Denies use of weight loss medication. Denies use of O2.   Emmi instructions given for colonoscopy.  Pre-Visit was conducted by phone due to Covid 19. Instructions were reviewed with patient. Patient is coming in for her procedure on Monday so patient received her instructions in My Chart. Patient confirmed that she had the instructions.

## 2019-05-26 ENCOUNTER — Ambulatory Visit (AMBULATORY_SURGERY_CENTER): Payer: Medicare Other | Admitting: Gastroenterology

## 2019-05-26 ENCOUNTER — Other Ambulatory Visit: Payer: Self-pay

## 2019-05-26 ENCOUNTER — Encounter: Payer: Self-pay | Admitting: Gastroenterology

## 2019-05-26 VITALS — BP 134/74 | HR 64 | Temp 98.2°F | Resp 12 | Ht 64.0 in | Wt 145.0 lb

## 2019-05-26 DIAGNOSIS — K219 Gastro-esophageal reflux disease without esophagitis: Secondary | ICD-10-CM | POA: Diagnosis not present

## 2019-05-26 DIAGNOSIS — K3189 Other diseases of stomach and duodenum: Secondary | ICD-10-CM

## 2019-05-26 DIAGNOSIS — K2970 Gastritis, unspecified, without bleeding: Secondary | ICD-10-CM | POA: Diagnosis not present

## 2019-05-26 MED ORDER — SODIUM CHLORIDE 0.9 % IV SOLN: 500.0000 mL | Freq: Once | INTRAVENOUS | Status: DC

## 2019-05-26 NOTE — Patient Instructions (Signed)
Handout given for GERD.  YOU HAD AN ENDOSCOPIC PROCEDURE TODAY AT [facility]:   Refer to the procedure report that was given to you for any specific questions about what was found during the examination.  If the procedure report does not answer your questions, please call your gastroenterologist to clarify.  If you requested that your care partner not be given the details of your procedure findings, then the procedure report has been included in a sealed envelope for you to review at your convenience later.  YOU SHOULD EXPECT: Some feelings of bloating in the abdomen. Passage of more gas than usual.  Walking can help get rid of the air that was put into your GI tract during the procedure and reduce the bloating. If you had a lower endoscopy (such as a colonoscopy or flexible sigmoidoscopy) you may notice spotting of blood in your stool or on the toilet paper. If you underwent a bowel prep for your procedure, you may not have a normal bowel movement for a few days.  Please Note:  You might notice some irritation and congestion in your nose or some drainage.  This is from the oxygen used during your procedure.  There is no need for concern and it should clear up in a day or so.  SYMPTOMS TO REPORT IMMEDIATELY:   Following upper endoscopy (EGD)  Vomiting of blood or coffee ground material  New chest pain or pain under the shoulder blades  Painful or persistently difficult swallowing  New shortness of breath  Fever of 100F or higher  Black, tarry-looking stools  For urgent or emergent issues, a gastroenterologist can be reached at any hour by calling [phone].   DIET:  We do recommend a small meal at first, but then you may proceed to your regular diet.  Drink plenty of fluids but you should avoid alcoholic beverages for 24 hours.  ACTIVITY:  You should plan to take it easy for the rest of today and you should NOT DRIVE or use heavy machinery until tomorrow (because of  the sedation medicines used during the test).    FOLLOW UP: Our staff will call the number listed on your records 48-72 hours following your procedure to check on you and address any questions or concerns that you may have regarding the information given to you following your procedure. If we do not reach you, we will leave a message.  We will attempt to reach you two times.  During this call, we will ask if you have developed any symptoms of COVID 19. If you develop any symptoms (ie: fever, flu-like symptoms, shortness of breath, cough etc.) before then, please call [phone].  If you test positive for Covid 19 in the 2 weeks post procedure, please call and report this information to [place].    If any biopsies were taken you will be contacted by phone or by letter within the next 1-3 weeks.  Please call us at [phone] if you have not heard about the biopsies in 3 weeks.    SIGNATURES/CONFIDENTIALITY: You and/or your care partner have signed paperwork which will be entered into your electronic medical record.  These signatures attest to the fact that that the information above on your After Visit Summary has been reviewed and is understood.  Full responsibility of the confidentiality of this discharge information lies with you and/or your care-partner.

## 2019-05-26 NOTE — Op Note (Signed)
[place] Patient Name: Cherica Heiden Procedure Date: 05/26/2019 9:27 AM MRN: [id number] Endoscopist: Mauri Pole , MD Age: 68 Referring MD:  Date of Birth: 02-Jun-1951 Gender: Female Account #: [id number] Procedure:                Upper GI endoscopy Indications:              Dyspepsia, Esophageal reflux, Esophageal reflux                            symptoms that recur despite appropriate therapy Medicines:                Monitored Anesthesia Care Procedure:                Pre-Anesthesia Assessment:                           - Prior to the procedure, a History and Physical                            was performed, and patient medications and                            allergies were reviewed. The patient's tolerance of                            previous anesthesia was also reviewed. The risks                            and benefits of the procedure and the sedation                            options and risks were discussed with the patient.                            All questions were answered, and informed consent                            was obtained. Prior Anticoagulants: The patient has                            taken no previous anticoagulant or antiplatelet                            agents. ASA Grade Assessment: II - A patient with                            mild systemic disease. After reviewing the risks                            and benefits, the patient was deemed in                            satisfactory condition to undergo the procedure.  After obtaining informed consent, the endoscope was                            passed under direct vision. Throughout the                            procedure, the patient's blood pressure, pulse, and                            oxygen saturations were monitored continuously. The                            Endoscope was introduced through the mouth, and                             advanced to the second part of duodenum. The upper                            GI endoscopy was accomplished without difficulty.                            The patient tolerated the procedure well. Scope In: Scope Out: Findings:                 The Z-line was regular and was found 35 cm from the                            incisors.                           No gross lesions were noted in the entire esophagus.                           Patchy mild inflammation characterized by                            congestion (edema), erosions and erythema was found                            in the entire examined stomach. Biopsies were taken                            with a cold forceps for Helicobacter pylori testing.                           The examined duodenum was normal. Complications:            No immediate complications. Estimated Blood Loss:     Estimated blood loss was minimal. Impression:               - Z-line regular, 35 cm from the incisors.                           - No gross lesions in esophagus.                           -  Gastritis. Biopsied.                           - Normal examined duodenum. Recommendation:           - Patient has a contact number available for                            emergencies. The signs and symptoms of potential                            delayed complications were discussed with the                            patient. Return to normal activities tomorrow.                            Written discharge instructions were provided to the                            patient.                           - Resume previous diet.                           - Continue present medications.                           - Await pathology results.                           - Follow an antireflux regimen.                           - Return to GI office, virtual visit in 3 months,                            next available. Mauri Pole, MD 05/26/2019 9:43:58  AM This report has been signed electronically.

## 2019-05-26 NOTE — Progress Notes (Signed)
Called to room to assist during endoscopic procedure.  Patient ID and intended procedure confirmed with present staff. Received instructions for my participation in the procedure from the performing physician.  

## 2019-05-26 NOTE — Progress Notes (Signed)
Pt's states no medical or surgical changes since previsit or office visit.  Temp, Covid per CW VS JB

## 2019-05-26 NOTE — Progress Notes (Signed)
A/ox3, pleased with MAC, report to RN 

## 2019-05-27 DIAGNOSIS — M25641 Stiffness of right hand, not elsewhere classified: Secondary | ICD-10-CM | POA: Diagnosis not present

## 2019-05-28 ENCOUNTER — Telehealth: Payer: Self-pay | Admitting: *Deleted

## 2019-05-28 NOTE — Telephone Encounter (Signed)
  Follow up Call-  Call back number 05/26/2019 09/12/2017  Post procedure Call Back phone  # [phone]  Permission to leave phone message Yes Yes  Some recent data might be hidden     Patient questions:  Do you have a fever, pain , or abdominal swelling? No. Pain Score  0 *  Have you tolerated food without any problems? Yes.    Have you been able to return to your normal activities? Yes.    Do you have any questions about your discharge instructions: Diet   No. Medications  No. Follow up visit  No.  Do you have questions or concerns about your Care? No.  Actions: * If pain score is 4 or above: No action needed, pain <4.  1. Have you developed a fever since your procedure? no  2.   Have you had an respiratory symptoms (SOB or cough) since your procedure? n0  3.   Have you tested positive for COVID 19 since your procedure no  4.   Have you had any family members/close contacts diagnosed with the COVID 19 since your procedure?  no   If yes to any of these questions please route to Joylene John, RN and Alphonsa Gin, Therapist, sports.

## 2019-05-29 DIAGNOSIS — M531 Cervicobrachial syndrome: Secondary | ICD-10-CM | POA: Diagnosis not present

## 2019-05-29 DIAGNOSIS — M9902 Segmental and somatic dysfunction of thoracic region: Secondary | ICD-10-CM | POA: Diagnosis not present

## 2019-05-29 DIAGNOSIS — M5134 Other intervertebral disc degeneration, thoracic region: Secondary | ICD-10-CM | POA: Diagnosis not present

## 2019-05-29 DIAGNOSIS — M9901 Segmental and somatic dysfunction of cervical region: Secondary | ICD-10-CM | POA: Diagnosis not present

## 2019-06-03 DIAGNOSIS — M5134 Other intervertebral disc degeneration, thoracic region: Secondary | ICD-10-CM | POA: Diagnosis not present

## 2019-06-03 DIAGNOSIS — M9901 Segmental and somatic dysfunction of cervical region: Secondary | ICD-10-CM | POA: Diagnosis not present

## 2019-06-03 DIAGNOSIS — M9902 Segmental and somatic dysfunction of thoracic region: Secondary | ICD-10-CM | POA: Diagnosis not present

## 2019-06-03 DIAGNOSIS — M531 Cervicobrachial syndrome: Secondary | ICD-10-CM | POA: Diagnosis not present

## 2019-06-04 ENCOUNTER — Other Ambulatory Visit: Payer: Self-pay

## 2019-06-04 ENCOUNTER — Encounter: Payer: Self-pay | Admitting: Family Medicine

## 2019-06-04 ENCOUNTER — Ambulatory Visit (INDEPENDENT_AMBULATORY_CARE_PROVIDER_SITE_OTHER): Payer: Medicare Other | Admitting: Family Medicine

## 2019-06-04 VITALS — BP 148/82 | HR 68 | Temp 98.0°F | Resp 16 | Ht 64.0 in | Wt 141.0 lb

## 2019-06-04 DIAGNOSIS — N9089 Other specified noninflammatory disorders of vulva and perineum: Secondary | ICD-10-CM | POA: Diagnosis not present

## 2019-06-04 DIAGNOSIS — Z8541 Personal history of malignant neoplasm of cervix uteri: Secondary | ICD-10-CM | POA: Insufficient documentation

## 2019-06-04 DIAGNOSIS — N952 Postmenopausal atrophic vaginitis: Secondary | ICD-10-CM | POA: Diagnosis not present

## 2019-06-04 DIAGNOSIS — Z90710 Acquired absence of both cervix and uterus: Secondary | ICD-10-CM | POA: Diagnosis not present

## 2019-06-04 DIAGNOSIS — N898 Other specified noninflammatory disorders of vagina: Secondary | ICD-10-CM

## 2019-06-04 DIAGNOSIS — M25641 Stiffness of right hand, not elsewhere classified: Secondary | ICD-10-CM | POA: Diagnosis not present

## 2019-06-04 LAB — POCT WET + KOH PREP
Trich by wet prep: ABSENT
Yeast by KOH: ABSENT
Yeast by wet prep: ABSENT

## 2019-06-04 LAB — POCT URINALYSIS DIP (MANUAL ENTRY)
Bilirubin, UA: NEGATIVE
Blood, UA: NEGATIVE
Glucose, UA: NEGATIVE mg/dL
Ketones, POC UA: NEGATIVE mg/dL
Leukocytes, UA: NEGATIVE
Nitrite, UA: NEGATIVE
Protein Ur, POC: NEGATIVE mg/dL
Spec Grav, UA: 1.015 (ref 1.010–1.025)
Urobilinogen, UA: 0.2 E.U./dL
pH, UA: 7 (ref 5.0–8.0)

## 2019-06-04 MED ORDER — METRONIDAZOLE 0.75 % VA GEL: 1.0000 | Freq: Two times a day (BID) | VAGINAL | 0 refills | Status: DC

## 2019-06-04 NOTE — Patient Instructions (Addendum)
     If you have lab work done today you will be contacted with your lab results within the next 2 weeks.  If you have not heard from [place] then please contact us. The fastest way to get your results is to register for My Chart. Pelvic ultrasound Sent medication for bacterial vaginosis if symptoms worsen Consider bone density with mammogram in the fall  IF you received an x-ray today, you will receive an invoice from [facility]. Please contact [facility] at [phone] with questions or concerns regarding your invoice.   IF you received labwork today, you will receive an invoice from [facility]. Please contact [facility] at [phone] with questions or concerns regarding your invoice.   Our billing staff will not be able to assist you with questions regarding bills from these companies.  You will be contacted with the lab results as soon as they are available. The fastest way to get your results is to activate your My Chart account. Instructions are located on the last page of this paperwork. If you have not heard from [place] regarding the results in 2 weeks, please contact this office.    '

## 2019-06-04 NOTE — Progress Notes (Signed)
Acute Office Visit  Subjective:    Patient ID: Breanna Sharp, female    DOB: [birth year], 68 y.o.   MRN: [id number]  Chief Complaint  Patient presents with  . Vaginal Irritation    pt states the has noticed pumps on her vaginal area x 1 weeks with some discharge     HPI Patient is in today for vaginal discharge and lesions noted on the left side of the introitus  Vaginal discharge starting 1.5 weeks ago with sticking, damp sensation. Pt states this improved Lesions noted on introitus-5 weeks-no pain associated. Pt used a different toilet tissue -changed back and bumps returned-left side only. Pt took a bath after cleaning tub. Pt states discomfort from a discharge. Clear drainage.  No odor.  Does not feel itchy.  Bacterial infection in the past. Pt states she does not feel like drainage is a  yeast infection.   Hysterectomy age 84-unclear if ovaries removed with uterus. No pap smear in many years due to hysterectomy. G0 Pt currently not sexually active.  Osteopenia-last DEXA 11/16 +FH breast CA-mammogram 10/19   Past Medical History:  Diagnosis Date  . Allergy   . Anxiety   . Arthritis   . Cancer ([place])   . Dyslipidemia    statin intolerance  . GERD (gastroesophageal reflux disease)   . Heart murmur   . Hyperlipidemia   . Hypertension   . Vertigo     Past Surgical History:  Procedure Laterality Date  . ABDOMINAL HYSTERECTOMY  1972   cervical cancer  . COLONOSCOPY    . WISDOM TOOTH EXTRACTION     uppers    Family History  Problem Relation Age of Onset  . Hypertension Mother        dx in her 19's  . Heart attack Mother 25  . Breast cancer Mother        unsure of age  . Heart attack Brother 94  . Diabetes Brother   . Hyperlipidemia Brother   . Hypertension Brother   . Heart disease Brother   . Colon cancer Neg Hx   . Esophageal cancer Neg Hx   . Rectal cancer Neg Hx   . Stomach cancer Neg Hx     Social History   Socioeconomic History  . Marital  status: Married    Spouse name: [name]  . Number of children: 0  . Years of education: 15  . Highest education level: Not on file  Occupational History    Employer: RETIRED    Comment: Retired  Scientific laboratory technician  . Financial resource strain: Not on file  . Food insecurity    Worry: Not on file    Inability: Not on file  . Transportation needs    Medical: Not on file    Non-medical: Not on file  Tobacco Use  . Smoking status: Former Smoker    Packs/day: 0.50    Years: 32.00    Pack years: 16.00    Types: Cigarettes    Quit date: 11/27/2002    Years since quitting: 16.5  . Smokeless tobacco: Never Used  Substance and Sexual Activity  . Alcohol use: Yes    Alcohol/week: 3.0 standard drinks    Types: 3 Standard drinks or equivalent per week    Comment: social - 2 or 3 times a month 1-2 glasses of wine  . Drug use: No  . Sexual activity: Yes    Partners: Male  Lifestyle  . Physical activity  Days per week: Not on file    Minutes per session: Not on file  . Stress: Not on file  Relationships  . Social Herbalist on phone: Not on file    Gets together: Not on file    Attends religious service: Not on file    Active member of club or organization: Not on file    Attends meetings of clubs or organizations: Not on file    Relationship status: Not on file  . Intimate partner violence    Fear of current or ex partner: Not on file    Emotionally abused: Not on file    Physically abused: Not on file    Forced sexual activity: Not on file  Other Topics Concern  . Not on file  Social History Narrative   Exercise 3 to 4 times/week walking for 45 min -1 hour   Patient lives at home with her husband Legrand [place]).   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   Right handed.   decaffeine Green Tea.    Outpatient Medications Prior to Visit  Medication Sig Dispense Refill  .  cholecalciferol (VITAMIN D) 1000 UNITS tablet Take 2,000 Units by mouth daily.     [name] ezetimibe (ZETIA) 10 MG tablet Take 1 tablet (10 mg total) by mouth daily. 90 tablet 0  . fexofenadine (ALLEGRA) 180 MG tablet Take by mouth.    . fluticasone (FLONASE) 50 MCG/ACT nasal spray Place 2 sprays into both nostrils daily. 48 g 6  . lisinopril-hydrochlorothiazide (PRINZIDE,ZESTORETIC) 10-12.5 MG tablet TAKE 1 TABLET DAILY 90 tablet 3  . Omeprazole 20 MG TBEC Take 2 tablets (40 mg total) by mouth daily. 180 each 3  . Pitavastatin Calcium (LIVALO) 1 MG TABS Take 1 tablet (1 mg total) by mouth daily. 90 tablet 3  . Polyethyl Glycol-Propyl Glycol (SYSTANE ULTRA OP) Apply 2 drops to eye daily.    . Tiotropium Bromide Monohydrate (SPIRIVA RESPIMAT) 2.5 MCG/ACT AERS Take 2 puffs by mouth daily. 12 g 1  . Travoprost, BAK Free, (TRAVATAN) 0.004 % SOLN ophthalmic solution Place 1 drop into both eyes at bedtime.     No facility-administered medications prior to visit.     Allergies  Allergen Reactions  . Statins     Muscle cramps    Review of Systems  Gastrointestinal: Positive for heartburn.  Genitourinary: Negative for dysuria, frequency and urgency.  vaginal discharge-"sticky" , no itch, clear, no color or odor     Objective:    Physical Exam  Constitutional: She appears well-developed and well-nourished. No distress.  Cardiovascular: Normal rate and regular rhythm.  Pulmonary/Chest: Effort normal and breath sounds normal.  Genitourinary:    No vaginal discharge.   introitus-left labia papules, NO VESICLES  Vaginal atrophy to insert speculum for visualization and unable to complete bimanual  BP (!) 148/82   Pulse 68   Temp 98 F (36.7 C) (Oral)   Resp 16   Ht 5\' 4"  (1.626 m)   Wt 141 lb (64 kg)   SpO2 95%   BMI 24.20 kg/m  Wt Readings from Last 3 Encounters:  06/04/19 141 lb (64 kg)  05/26/19 145 lb (65.8 kg)  05/23/19 142 lb (64.4 kg)    Lab Results  Component Value Date    TSH 2.390 12/03/2017   Lab Results  Component Value Date   WBC 5.2 09/01/2015   HGB  12.2 09/01/2015   HCT 37.2 09/01/2015   MCV 84.9 09/01/2015   PLT 235 09/01/2015   Lab Results  Component Value Date   NA 142 01/20/2019   K 3.9 01/20/2019   CO2 23 01/20/2019   GLUCOSE 115 (H) 01/20/2019   BUN 12 01/20/2019   CREATININE 0.77 01/20/2019   BILITOT 0.4 01/20/2019   ALKPHOS 68 01/20/2019   AST 21 01/20/2019   ALT 26 01/20/2019   PROT 6.7 01/20/2019   ALBUMIN 4.6 01/20/2019   CALCIUM 9.8 01/20/2019   Lab Results  Component Value Date   CHOL 181 01/20/2019   Lab Results  Component Value Date   HDL 50 01/20/2019   Lab Results  Component Value Date   LDLCALC 103 (H) 01/20/2019   Lab Results  Component Value Date   TRIG 139 01/20/2019   Lab Results  Component Value Date   CHOLHDL 3.6 01/20/2019   Lab Results  Component Value Date   HGBA1C 5.6 01/16/2018       Assessment & Plan:  1. Vaginal discharge Metrogel vaginal -rx-d/w pt if symptoms worsen to fill rx,neg on wet prep today - POCT urinalysis dipstick - POCT Wet + KOH Prep - Herpes simplex virus culture-pending - US Pelvic Complete With Transvaginal; Future-concern for limited evaluation with h/o cervical ca and inability to complete bimanual and pt +FH breast CA and personal h/o cervical cancer. Pt does not know if ovaries remembered as part of hysterectomy at age 52.   2. H/O abdominal hysterectomy Unable to complete bimanual exam for evaluation of ovaries - US Pelvic Complete With Transvaginal; Future  3. History of cervical cancer - US Pelvic Complete With Transvaginal; Future  4. Vaginal atrophy - US Pelvic Complete With Transvaginal; Future  5. Labial lesion Herpes culture pending-pt with no pain in the area Miana Politte Hannah Beat, MD

## 2019-06-05 DIAGNOSIS — M5134 Other intervertebral disc degeneration, thoracic region: Secondary | ICD-10-CM | POA: Diagnosis not present

## 2019-06-05 DIAGNOSIS — M9901 Segmental and somatic dysfunction of cervical region: Secondary | ICD-10-CM | POA: Diagnosis not present

## 2019-06-05 DIAGNOSIS — M531 Cervicobrachial syndrome: Secondary | ICD-10-CM | POA: Diagnosis not present

## 2019-06-05 DIAGNOSIS — M9902 Segmental and somatic dysfunction of thoracic region: Secondary | ICD-10-CM | POA: Diagnosis not present

## 2019-06-06 ENCOUNTER — Encounter: Payer: Self-pay | Admitting: Gastroenterology

## 2019-06-09 LAB — SPECIMEN STATUS REPORT

## 2019-06-10 DIAGNOSIS — M9902 Segmental and somatic dysfunction of thoracic region: Secondary | ICD-10-CM | POA: Diagnosis not present

## 2019-06-10 DIAGNOSIS — H43813 Vitreous degeneration, bilateral: Secondary | ICD-10-CM | POA: Diagnosis not present

## 2019-06-10 DIAGNOSIS — M9901 Segmental and somatic dysfunction of cervical region: Secondary | ICD-10-CM | POA: Diagnosis not present

## 2019-06-10 DIAGNOSIS — M531 Cervicobrachial syndrome: Secondary | ICD-10-CM | POA: Diagnosis not present

## 2019-06-10 DIAGNOSIS — H2513 Age-related nuclear cataract, bilateral: Secondary | ICD-10-CM | POA: Diagnosis not present

## 2019-06-10 DIAGNOSIS — H401131 Primary open-angle glaucoma, bilateral, mild stage: Secondary | ICD-10-CM | POA: Diagnosis not present

## 2019-06-10 DIAGNOSIS — H18413 Arcus senilis, bilateral: Secondary | ICD-10-CM | POA: Diagnosis not present

## 2019-06-10 DIAGNOSIS — M5134 Other intervertebral disc degeneration, thoracic region: Secondary | ICD-10-CM | POA: Diagnosis not present

## 2019-06-11 DIAGNOSIS — H6123 Impacted cerumen, bilateral: Secondary | ICD-10-CM | POA: Diagnosis not present

## 2019-06-11 DIAGNOSIS — M25641 Stiffness of right hand, not elsewhere classified: Secondary | ICD-10-CM | POA: Diagnosis not present

## 2019-06-13 DIAGNOSIS — M9901 Segmental and somatic dysfunction of cervical region: Secondary | ICD-10-CM | POA: Diagnosis not present

## 2019-06-13 DIAGNOSIS — M9902 Segmental and somatic dysfunction of thoracic region: Secondary | ICD-10-CM | POA: Diagnosis not present

## 2019-06-13 DIAGNOSIS — M531 Cervicobrachial syndrome: Secondary | ICD-10-CM | POA: Diagnosis not present

## 2019-06-13 DIAGNOSIS — M5134 Other intervertebral disc degeneration, thoracic region: Secondary | ICD-10-CM | POA: Diagnosis not present

## 2019-06-17 DIAGNOSIS — M531 Cervicobrachial syndrome: Secondary | ICD-10-CM | POA: Diagnosis not present

## 2019-06-17 DIAGNOSIS — M9902 Segmental and somatic dysfunction of thoracic region: Secondary | ICD-10-CM | POA: Diagnosis not present

## 2019-06-17 DIAGNOSIS — M9901 Segmental and somatic dysfunction of cervical region: Secondary | ICD-10-CM | POA: Diagnosis not present

## 2019-06-17 DIAGNOSIS — M5134 Other intervertebral disc degeneration, thoracic region: Secondary | ICD-10-CM | POA: Diagnosis not present

## 2019-06-19 ENCOUNTER — Telehealth: Payer: Self-pay | Admitting: Gastroenterology

## 2019-06-19 DIAGNOSIS — M9902 Segmental and somatic dysfunction of thoracic region: Secondary | ICD-10-CM | POA: Diagnosis not present

## 2019-06-19 DIAGNOSIS — M9901 Segmental and somatic dysfunction of cervical region: Secondary | ICD-10-CM | POA: Diagnosis not present

## 2019-06-19 DIAGNOSIS — M531 Cervicobrachial syndrome: Secondary | ICD-10-CM | POA: Diagnosis not present

## 2019-06-19 DIAGNOSIS — M5134 Other intervertebral disc degeneration, thoracic region: Secondary | ICD-10-CM | POA: Diagnosis not present

## 2019-06-19 NOTE — Telephone Encounter (Signed)
Pt reported that she has dysphagia, indigestion and gas pains under her left breast.  Please advise.

## 2019-06-19 NOTE — Telephone Encounter (Signed)
Patient is advised. Agrees to this plan. She will call me next week with an update.

## 2019-06-19 NOTE — Telephone Encounter (Signed)
Continue omeprazole daily and antireflux measures.  FD guard 1 capsule up to 3 times daily after meals as needed for dyspepsia symptoms.  Follow-up office visit next available.

## 2019-06-19 NOTE — Telephone Encounter (Signed)
Following the reflux precautions and taking her Omeprazole every morning as directed. She is not eating spicy or citrus foods. She will develop indigestion about 2 to 3 hours after her first meal of the day. She has a "sensation" when she swallows but denies pain or choking. No reflux. Please advise.

## 2019-06-24 DIAGNOSIS — M9902 Segmental and somatic dysfunction of thoracic region: Secondary | ICD-10-CM | POA: Diagnosis not present

## 2019-06-24 DIAGNOSIS — M531 Cervicobrachial syndrome: Secondary | ICD-10-CM | POA: Diagnosis not present

## 2019-06-24 DIAGNOSIS — M9901 Segmental and somatic dysfunction of cervical region: Secondary | ICD-10-CM | POA: Diagnosis not present

## 2019-06-24 DIAGNOSIS — M5134 Other intervertebral disc degeneration, thoracic region: Secondary | ICD-10-CM | POA: Diagnosis not present

## 2019-06-26 LAB — HERPES SIMPLEX VIRUS CULTURE

## 2019-06-27 DIAGNOSIS — M5134 Other intervertebral disc degeneration, thoracic region: Secondary | ICD-10-CM | POA: Diagnosis not present

## 2019-06-27 DIAGNOSIS — M9902 Segmental and somatic dysfunction of thoracic region: Secondary | ICD-10-CM | POA: Diagnosis not present

## 2019-06-27 DIAGNOSIS — M9901 Segmental and somatic dysfunction of cervical region: Secondary | ICD-10-CM | POA: Diagnosis not present

## 2019-06-27 DIAGNOSIS — M531 Cervicobrachial syndrome: Secondary | ICD-10-CM | POA: Diagnosis not present

## 2019-06-30 ENCOUNTER — Other Ambulatory Visit: Payer: Self-pay | Admitting: Family Medicine

## 2019-06-30 ENCOUNTER — Telehealth: Payer: Self-pay | Admitting: Gastroenterology

## 2019-06-30 DIAGNOSIS — Z1231 Encounter for screening mammogram for malignant neoplasm of breast: Secondary | ICD-10-CM

## 2019-06-30 DIAGNOSIS — M531 Cervicobrachial syndrome: Secondary | ICD-10-CM | POA: Diagnosis not present

## 2019-06-30 DIAGNOSIS — M9901 Segmental and somatic dysfunction of cervical region: Secondary | ICD-10-CM | POA: Diagnosis not present

## 2019-06-30 DIAGNOSIS — M5134 Other intervertebral disc degeneration, thoracic region: Secondary | ICD-10-CM | POA: Diagnosis not present

## 2019-06-30 DIAGNOSIS — M9902 Segmental and somatic dysfunction of thoracic region: Secondary | ICD-10-CM | POA: Diagnosis not present

## 2019-06-30 NOTE — Telephone Encounter (Signed)
Patient reports relief with FDguard. She reports she begins feeling her indigestion symptoms in the mornings sometimes before she take her Omeprazole. She has it sometimes after she takes her Omeprazole. Her question is if the Omeprazole is "doing anything for me at all." She wants to not take the omeprazole tomorrow. Discussed anti-reflux measures and diet. She states she is compliant with these things. Agrees to call me with an update of her symptoms.

## 2019-07-01 ENCOUNTER — Ambulatory Visit
Admission: RE | Admit: 2019-07-01 | Discharge: 2019-07-01 | Disposition: A | Discharge status: Home / Self Care | Payer: Medicare Other | Source: Ambulatory Visit | Attending: Family Medicine | Admitting: Family Medicine

## 2019-07-01 ENCOUNTER — Other Ambulatory Visit: Payer: Self-pay | Admitting: Family Medicine

## 2019-07-01 DIAGNOSIS — Z90710 Acquired absence of both cervix and uterus: Secondary | ICD-10-CM

## 2019-07-01 DIAGNOSIS — N952 Postmenopausal atrophic vaginitis: Secondary | ICD-10-CM

## 2019-07-01 DIAGNOSIS — Z8541 Personal history of malignant neoplasm of cervix uteri: Secondary | ICD-10-CM

## 2019-07-01 DIAGNOSIS — C539 Malignant neoplasm of cervix uteri, unspecified: Secondary | ICD-10-CM | POA: Diagnosis not present

## 2019-07-01 DIAGNOSIS — N898 Other specified noninflammatory disorders of vagina: Secondary | ICD-10-CM

## 2019-07-02 DIAGNOSIS — M20011 Mallet finger of right finger(s): Secondary | ICD-10-CM | POA: Diagnosis not present

## 2019-07-03 ENCOUNTER — Other Ambulatory Visit: Payer: Self-pay

## 2019-07-03 ENCOUNTER — Telehealth: Payer: Self-pay | Admitting: Gastroenterology

## 2019-07-03 DIAGNOSIS — M5134 Other intervertebral disc degeneration, thoracic region: Secondary | ICD-10-CM | POA: Diagnosis not present

## 2019-07-03 DIAGNOSIS — M9902 Segmental and somatic dysfunction of thoracic region: Secondary | ICD-10-CM | POA: Diagnosis not present

## 2019-07-03 DIAGNOSIS — M9901 Segmental and somatic dysfunction of cervical region: Secondary | ICD-10-CM | POA: Diagnosis not present

## 2019-07-03 DIAGNOSIS — M531 Cervicobrachial syndrome: Secondary | ICD-10-CM | POA: Diagnosis not present

## 2019-07-03 MED ORDER — PANTOPRAZOLE SODIUM 40 MG PO TBEC: 40.0000 mg | DELAYED_RELEASE_TABLET | Freq: Two times a day (BID) | ORAL | 0 refills | Status: DC

## 2019-07-03 MED ORDER — PANTOPRAZOLE SODIUM 40 MG PO TBEC: 40.0000 mg | DELAYED_RELEASE_TABLET | Freq: Two times a day (BID) | ORAL | 1 refills | Status: DC

## 2019-07-03 NOTE — Telephone Encounter (Signed)
Doctor of the Day Patient of Dr Silverio Decamp with dx of GERD. Spoke with the patient. She is concerned the capsules are sticking at the base of her esophagus. She has found it feels less like this if she takes the capsule while she is eating "because the food helps it go on down." She feels she needs change of the PPI to a different medication because she is not getting benefit from it. She is scheduled for an in office visit 07/29/19 with Dr Silverio Decamp.  Please advise on change of the PPI. Thank you.

## 2019-07-03 NOTE — Telephone Encounter (Signed)
Discussed this again with the patient. She feels she does need to change the PPI. She can get the Omeprazole down with food, but but does not think it is as effective as it should be or not working at all due to the difficulty in swallowing capsules.  Protonix called to [facility].

## 2019-07-03 NOTE — Telephone Encounter (Signed)
If her concern is that she feels like the PPI is getting stuck, agree with taking with plenty of fluids.  But if her concern is that she does not think that the PPI is working (i.e., having breakthrough reflux symptoms), then would be reasonable to change acid suppression therapy.  If it is the latter, can consider trial of Protonix 40 mg p.o. twice daily then follow-up with Dr. Silverio Decamp as scheduled to discuss medication efficacy.

## 2019-07-08 ENCOUNTER — Telehealth: Payer: Self-pay | Admitting: Family Medicine

## 2019-07-08 NOTE — Telephone Encounter (Signed)
Pt has two missed calls from the office today at 8:13AM and 8:14AM. Msg was not left on vm box or epic. Please advise

## 2019-07-09 ENCOUNTER — Telehealth: Payer: Self-pay | Admitting: Gastroenterology

## 2019-07-09 NOTE — Telephone Encounter (Signed)
Called back. No answer. Left message in her voicemail. She also has an appointment for follow up with Dr Silverio Decamp 07/29/19.

## 2019-07-09 NOTE — Telephone Encounter (Signed)
Patient is calling to give follow up to the change of the PPI from Omeprazole to Protonix. She cannot tell any improvement or worsening of her symptoms. She is not feeling any acid burn but rather a sensation of something in her throat. She knows the Protonix is traveling down the esophagus better than she felt the other PPI was. She is wondering now if she has post nasal drip that is causing this symptoms. She has decided she will treat her known allergies with her prescribed Flonase. Follow up by phone in 1 week. Keep the appointment as scheduled for 07/29/19.

## 2019-07-10 ENCOUNTER — Encounter: Payer: Self-pay | Admitting: Radiology

## 2019-07-10 NOTE — Telephone Encounter (Signed)
Ok, continue Protonix and follow up as scheduled. Thanks

## 2019-07-22 ENCOUNTER — Ambulatory Visit: Payer: Medicare Other | Admitting: Family Medicine

## 2019-07-23 ENCOUNTER — Other Ambulatory Visit: Payer: Self-pay

## 2019-07-23 ENCOUNTER — Encounter: Payer: Self-pay | Admitting: Family Medicine

## 2019-07-23 ENCOUNTER — Ambulatory Visit (INDEPENDENT_AMBULATORY_CARE_PROVIDER_SITE_OTHER): Payer: Medicare Other | Admitting: Family Medicine

## 2019-07-23 VITALS — BP 162/74 | HR 90 | Temp 98.4°F | Resp 14 | Ht 64.0 in | Wt 138.2 lb

## 2019-07-23 DIAGNOSIS — E785 Hyperlipidemia, unspecified: Secondary | ICD-10-CM

## 2019-07-23 DIAGNOSIS — J301 Allergic rhinitis due to pollen: Secondary | ICD-10-CM

## 2019-07-23 DIAGNOSIS — R739 Hyperglycemia, unspecified: Secondary | ICD-10-CM

## 2019-07-23 DIAGNOSIS — J449 Chronic obstructive pulmonary disease, unspecified: Secondary | ICD-10-CM | POA: Diagnosis not present

## 2019-07-23 DIAGNOSIS — I10 Essential (primary) hypertension: Secondary | ICD-10-CM | POA: Diagnosis not present

## 2019-07-23 DIAGNOSIS — Z23 Encounter for immunization: Secondary | ICD-10-CM | POA: Diagnosis not present

## 2019-07-23 MED ORDER — LISINOPRIL-HYDROCHLOROTHIAZIDE 20-12.5 MG PO TABS: 1.0000 | ORAL_TABLET | Freq: Every day | ORAL | 1 refills | Status: DC

## 2019-07-23 MED ORDER — FLUTICASONE PROPIONATE 50 MCG/ACT NA SUSP: 2.0000 | Freq: Every day | NASAL | 6 refills | Status: DC

## 2019-07-23 MED ORDER — SPIRIVA RESPIMAT 2.5 MCG/ACT IN AERS: 2.0000 | INHALATION_SPRAY | Freq: Every day | RESPIRATORY_TRACT | 1 refills | Status: DC

## 2019-07-23 NOTE — Patient Instructions (Addendum)
Change to higher dose BP med, but keep a record of your blood pressures outside of the office and send me those by Mychart if possible in next few weeks. If new side effects, let me know. Thanks for coming in today.   Return to the clinic or go to the nearest emergency room if any of your symptoms worsen or new symptoms occur.       How to Take Your Blood Pressure Blood pressure is a measurement of how strongly your blood is pressing against the walls of your arteries. Arteries are blood vessels that carry blood from your heart throughout your body. Your health care provider takes your blood pressure at each office visit. You can also take your own blood pressure at home with a blood pressure machine. You may need to take your own blood pressure:  To confirm a diagnosis of high blood pressure (hypertension).  To monitor your blood pressure over time.  To make sure your blood pressure medicine is working. Supplies needed: To take your blood pressure, you will need a blood pressure machine. You can buy a blood pressure machine, or blood pressure monitor, at most drugstores or online. There are several types of home blood pressure monitors. When choosing one, consider the following:  Choose a monitor that has an arm cuff.  Choose a cuff that wraps snugly around your upper arm. You should be able to fit only one finger between your arm and the cuff.  Do not choose a monitor that measures your blood pressure from your wrist or finger. Your health care provider can suggest a reliable monitor that will meet your needs. How to prepare To get the most accurate reading, avoid the following for 30 minutes before you check your blood pressure:  Drinking caffeine.  Drinking alcohol.  Eating.  Smoking.  Exercising. Five minutes before you check your blood pressure:  Empty your bladder.  Sit quietly without talking in a dining chair, rather than in a soft couch or armchair. How to take  your blood pressure To check your blood pressure, follow the instructions in the manual that came with your blood pressure monitor. If you have a digital blood pressure monitor, the instructions may be as follows: 1. Sit up straight. 2. Place your feet on the floor. Do not cross your ankles or legs. 3. Rest your left arm at the level of your heart on a table or desk or on the arm of a chair. 4. Pull up your shirt sleeve. 5. Wrap the blood pressure cuff around the upper part of your left arm, 1 inch (2.5 cm) above your elbow. It is best to wrap the cuff around bare skin. 6. Fit the cuff snugly around your arm. You should be able to place only one finger between the cuff and your arm. 7. Position the cord inside the groove of your elbow. 8. Press the power button. 9. Sit quietly while the cuff inflates and deflates. 10. Read the digital reading on the monitor screen and write it down (record it). 11. Wait 2-3 minutes, then repeat the steps, starting at step 1. What does my blood pressure reading mean? A blood pressure reading consists of a higher number over a lower number. Ideally, your blood pressure should be below 120/80. The first ("top") number is called the systolic pressure. It is a measure of the pressure in your arteries as your heart beats. The second ("bottom") number is called the diastolic pressure. It is a measure of  the pressure in your arteries as the heart relaxes. Blood pressure is classified into four stages. The following are the stages for adults who do not have a short-term serious illness or a chronic condition. Systolic pressure and diastolic pressure are measured in a unit called mm Hg. Normal  Systolic pressure: below 123456.  Diastolic pressure: below 80. Elevated  Systolic pressure: Q000111Q.  Diastolic pressure: below 80. Hypertension stage 1  Systolic pressure: 0000000.  Diastolic pressure: XX123456. Hypertension stage 2  Systolic pressure: XX123456 or  above.  Diastolic pressure: 90 or above. You can have prehypertension or hypertension even if only the systolic or only the diastolic number in your reading is higher than normal. Follow these instructions at home:  Check your blood pressure as often as recommended by your health care provider.  Take your monitor to the next appointment with your health care provider to make sure: ? That you are using it correctly. ? That it provides accurate readings.  Be sure you understand what your goal blood pressure numbers are.  Tell your health care provider if you are having any side effects from blood pressure medicine. Contact a health care provider if:  Your blood pressure is consistently high. Get help right away if:  Your systolic blood pressure is higher than 180.  Your diastolic blood pressure is higher than 110. This information is not intended to replace advice given to you by your health care provider. Make sure you discuss any questions you have with your health care provider. Document Released: 04/21/2016 Document Revised: 10/26/2017 Document Reviewed: 04/21/2016 Elsevier Patient Education  [employer].    If you have lab work done today you will be contacted with your lab results within the next 2 weeks.  If you have not heard from [place] then please contact us. The fastest way to get your results is to register for My Chart.   IF you received an x-ray today, you will receive an invoice from [facility]. Please contact [facility] at [phone] with questions or concerns regarding your invoice.   IF you received labwork today, you will receive an invoice from [facility]. Please contact [facility] at [phone] with questions or concerns regarding your invoice.   Our billing staff will not be able to assist you with questions regarding bills from these companies.  You will be contacted with the lab results as soon as they are available. The fastest way  to get your results is to activate your My Chart account. Instructions are located on the last page of this paperwork. If you have not heard from [place] regarding the results in 2 weeks, please contact this office.

## 2019-07-23 NOTE — Progress Notes (Signed)
Subjective:    Patient ID: Breanna Sharp, female    DOB: 07-19-1951, 68 y.o.   MRN: [id number]  HPI Breanna Sharp is a 68 y.o. female Presents today for: Chief Complaint  Patient presents with   Hypertension    Here for her 6 month f/u. Patient will need a refill on her meds I already pend. She want ot make sure its a 90 day supply   Hypertension: BP Readings from Last 3 Encounters:  07/23/19 (!) 176/82  06/04/19 (!) 148/82  05/26/19 134/74   Lab Results  Component Value Date   CREATININE 0.77 01/20/2019  repeat 162/74.  Taking lisinopril/HCT 10/12.5 mg daily Home readings: no recent home measurements.  No new side effects with meds.   COPD: Group a by gold 2017 classification.  Previous PFTs without apparent obstruction but evidence of small airway disease, extensive smoking history and has responded well to Spiriva in the past.  Last saw pulmonary in August 2019. Previously treated by specialist, has been stable on Spiriva. Breathing doing well.  Using flonase for allergies - doing well.   Hyperlipidemia:  Lab Results  Component Value Date   CHOL 181 01/20/2019   HDL 50 01/20/2019   LDLCALC 103 (H) 01/20/2019   TRIG 139 01/20/2019   CHOLHDL 3.6 01/20/2019   Lab Results  Component Value Date   ALT 26 01/20/2019   AST 21 01/20/2019   ALKPHOS 68 01/20/2019   BILITOT 0.4 01/20/2019  Zetia 10 mg daily, Livalo 1mg  QD.  Intolerant to other statins. No new side effects with this regimen. Rx by Dr. Debara Pickett - cardiology appt in October.  Hyperglycemia: Glucose 115 in 12/2018. Plan on A1c today.   Patient Active Problem List   Diagnosis Date Noted   H/O abdominal hysterectomy 06/04/2019   Vaginal discharge 06/04/2019   History of cervical cancer 06/04/2019   Vaginal atrophy 06/04/2019   Labial lesion 06/04/2019   Epidemic vertigo 01/30/2018   COPD, group A, by GOLD 2017 classification ([place]) 06/06/2017   Dyslipidemia 05/11/2016   Shortness of  breath 05/11/2016   Essential hypertension 05/11/2016   Osteopenia 10/12/2015   DOE (dyspnea on exertion) 05/02/2013   Chronic sinusitis 05/02/2013   Vertigo 08/14/2012   Benign essential HTN 08/14/2012   BMI 28.0-28.9,adult 08/14/2012   COLONIC POLYPS 01/24/2008   ACUT GASTR ULCER W/O MENTION HEMORR PERF/OBST 01/24/2008   Past Medical History:  Diagnosis Date   Allergy    Anxiety    Arthritis    Cancer ([place])    Dyslipidemia    statin intolerance   GERD (gastroesophageal reflux disease)    Heart murmur    Hyperlipidemia    Hypertension    Vertigo    Past Surgical History:  Procedure Laterality Date   ABDOMINAL HYSTERECTOMY  1972   cervical cancer   COLONOSCOPY     WISDOM TOOTH EXTRACTION     uppers   Allergies  Allergen Reactions   Statins     Muscle cramps   Prior to Admission medications   Medication Sig Start Date End Date Taking? Authorizing Provider  cholecalciferol (VITAMIN D) 1000 UNITS tablet Take 2,000 Units by mouth daily.    Yes [provider]  ezetimibe (ZETIA) 10 MG tablet Take 1 tablet (10 mg total) by mouth daily. 05/12/19  Yes Hilty, Nadean Corwin, MD  fexofenadine (ALLEGRA) 180 MG tablet Take by mouth. 10/04/13  Yes [provider]  fluticasone (FLONASE) 50 MCG/ACT nasal spray Place 2 sprays into  both nostrils daily. 07/15/18  Yes Wendie Agreste, MD  lisinopril-hydrochlorothiazide (PRINZIDE,ZESTORETIC) 10-12.5 MG tablet TAKE 1 TABLET DAILY 07/15/18  Yes Wendie Agreste, MD  pantoprazole (PROTONIX) 40 MG tablet Take 1 tablet (40 mg total) by mouth 2 (two) times daily. 07/03/19  Yes Nandigam, Venia Minks, MD  Pitavastatin Calcium (LIVALO) 1 MG TABS Take 1 tablet (1 mg total) by mouth daily. 08/27/18  Yes Hilty, Nadean Corwin, MD  Polyethyl Glycol-Propyl Glycol (SYSTANE ULTRA OP) Apply 2 drops to eye daily.   Yes [provider]  Tiotropium Bromide Monohydrate (SPIRIVA RESPIMAT) 2.5 MCG/ACT AERS Take 2 puffs by mouth  daily. 01/20/19  Yes Wendie Agreste, MD  Travoprost, BAK Free, (TRAVATAN) 0.004 % SOLN ophthalmic solution Place 1 drop into both eyes at bedtime.   Yes [provider]   Social History   Socioeconomic History   Marital status: Married    Spouse name: Breanna Sharp   Number of children: 0   Years of education: 12   Highest education level: Not on file  Occupational History    Employer: RETIRED    Comment: Retired  Scientist, product/process development strain: Not on file   Food insecurity    Worry: Not on file    Inability: Not on Lexicographer needs    Medical: Not on file    Non-medical: Not on file  Tobacco Use   Smoking status: Former Smoker    Packs/day: 0.50    Years: 32.00    Pack years: 16.00    Types: Cigarettes    Quit date: 11/27/2002    Years since quitting: 16.6   Smokeless tobacco: Never Used  Substance and Sexual Activity   Alcohol use: Yes    Alcohol/week: 3.0 standard drinks    Types: 3 Standard drinks or equivalent per week    Comment: social - 2 or 3 times a month 1-2 glasses of wine   Drug use: No   Sexual activity: Yes    Partners: Male  Lifestyle   Physical activity    Days per week: Not on file    Minutes per session: Not on file   Stress: Not on file  Relationships   Social connections    Talks on phone: Not on file    Gets together: Not on file    Attends religious service: Not on file    Active member of club or organization: Not on file    Attends meetings of clubs or organizations: Not on file    Relationship status: Not on file   Intimate partner violence    Fear of current or ex partner: Not on file    Emotionally abused: Not on file    Physically abused: Not on file    Forced sexual activity: Not on file  Other Topics Concern   Not on file  Social History Narrative   Exercise 3 to 4 times/week walking for 45 min -1 hour   Patient lives at home with her husband Legrand [place]o).   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   Right handed.   decaffeine Green Tea.    Review of Systems  Constitutional: Negative for fatigue and unexpected weight change.  Respiratory: Negative for chest tightness and shortness of breath.   Cardiovascular: Negative for chest pain, palpitations and leg swelling.  Gastrointestinal: Negative for abdominal pain and blood in stool.  Neurological: Negative for dizziness, syncope, light-headedness and headaches.       Objective:   Physical Exam Vitals signs reviewed.  Constitutional:      Appearance: She is well-developed.  HENT:     Head: Normocephalic and atraumatic.  Eyes:     Conjunctiva/sclera: Conjunctivae normal.     Pupils: Pupils are equal, round, and reactive to light.  Neck:     Vascular: No carotid bruit.  Cardiovascular:     Rate and Rhythm: Normal rate and regular rhythm.     Heart sounds: Normal heart sounds.  Pulmonary:     Effort: Pulmonary effort is normal. No respiratory distress.     Breath sounds: Normal breath sounds. No wheezing or rales.  Abdominal:     Palpations: Abdomen is soft. There is no pulsatile mass.     Tenderness: There is no abdominal tenderness.  Skin:    General: Skin is warm and dry.  Neurological:     Mental Status: She is alert and oriented to person, place, and time.  Psychiatric:        Behavior: Behavior normal.    Vitals:   07/23/19 0855 07/23/19 0957  BP: (!) 176/82 (!) 162/74  Pulse: 90   Resp: 14   Temp: 98.4 F (36.9 C)   TempSrc: Oral   SpO2: 97%   Weight: 138 lb 3.2 oz (62.7 kg)   Height: 5\' 4"  (1.626 m)           Assessment & Plan:    SUNSHINE ZIPF is a 68 y.o. female Essential hypertension - Plan: Comprehensive metabolic panel, lisinopril-hydrochlorothiazide (ZESTORETIC) 20-12.5 MG tablet  -Increase lisinopril component to 20 mg, orthostatic precautions with increase in regimen.  Check  labs.  Allergic rhinitis due to pollen, unspecified seasonality - Plan: fluticasone (FLONASE) 50 MCG/ACT nasal spray  -Continue Flonase  Chronic obstructive pulmonary disease, unspecified COPD type ([place]) - Plan: Tiotropium Bromide Monohydrate (SPIRIVA RESPIMAT) 2.5 MCG/ACT AERS  -Stable, continue Spiriva  Flu vaccine need - Plan: Flu Vaccine QUAD High Dose(Fluad)  Hyperglycemia - Plan: Hemoglobin A1c  -Slight elevation previously, check A1c.  Hyperlipidemia, unspecified hyperlipidemia type - Plan: Lipid Panel Continue current regimen, check labs  Meds ordered this encounter  Medications   fluticasone (FLONASE) 50 MCG/ACT nasal spray    Sig: Place 2 sprays into both nostrils daily.    Dispense:  48 g    Refill:  6   Tiotropium Bromide Monohydrate (SPIRIVA RESPIMAT) 2.5 MCG/ACT AERS    Sig: Take 2 puffs by mouth daily.    Dispense:  12 g    Refill:  1   lisinopril-hydrochlorothiazide (ZESTORETIC) 20-12.5 MG tablet    Sig: Take 1 tablet by mouth daily.    Dispense:  90 tablet    Refill:  1   Patient Instructions   Change to higher dose BP med, but keep a record of your blood pressures outside of the office and send me those by Mychart if possible in next few weeks. If new side effects, let me know. Thanks for coming in today.   Return to the clinic or go to the nearest emergency room if any of your symptoms worsen or new symptoms occur.       How to Take Your Blood Pressure Blood pressure is a measurement of how strongly your blood is pressing against the walls of your arteries. Arteries are blood vessels that carry blood from your heart throughout your body. Your health care provider takes your blood  pressure at each office visit. You can also take your own blood pressure at home with a blood pressure machine. You may need to take your own blood pressure:  To confirm a diagnosis of high blood pressure (hypertension).  To monitor your blood pressure over time.  To  make sure your blood pressure medicine is working. Supplies needed: To take your blood pressure, you will need a blood pressure machine. You can buy a blood pressure machine, or blood pressure monitor, at most drugstores or online. There are several types of home blood pressure monitors. When choosing one, consider the following:  Choose a monitor that has an arm cuff.  Choose a cuff that wraps snugly around your upper arm. You should be able to fit only one finger between your arm and the cuff.  Do not choose a monitor that measures your blood pressure from your wrist or finger. Your health care provider can suggest a reliable monitor that will meet your needs. How to prepare To get the most accurate reading, avoid the following for 30 minutes before you check your blood pressure:  Drinking caffeine.  Drinking alcohol.  Eating.  Smoking.  Exercising. Five minutes before you check your blood pressure:  Empty your bladder.  Sit quietly without talking in a dining chair, rather than in a soft couch or armchair. How to take your blood pressure To check your blood pressure, follow the instructions in the manual that came with your blood pressure monitor. If you have a digital blood pressure monitor, the instructions may be as follows: 1. Sit up straight. 2. Place your feet on the floor. Do not cross your ankles or legs. 3. Rest your left arm at the level of your heart on a table or desk or on the arm of a chair. 4. Pull up your shirt sleeve. 5. Wrap the blood pressure cuff around the upper part of your left arm, 1 inch (2.5 cm) above your elbow. It is best to wrap the cuff around bare skin. 6. Fit the cuff snugly around your arm. You should be able to place only one finger between the cuff and your arm. 7. Position the cord inside the groove of your elbow. 8. Press the power button. 9. Sit quietly while the cuff inflates and deflates. 10. Read the digital reading on the monitor  screen and write it down (record it). 11. Wait 2-3 minutes, then repeat the steps, starting at step 1. What does my blood pressure reading mean? A blood pressure reading consists of a higher number over a lower number. Ideally, your blood pressure should be below 120/80. The first ("top") number is called the systolic pressure. It is a measure of the pressure in your arteries as your heart beats. The second ("bottom") number is called the diastolic pressure. It is a measure of the pressure in your arteries as the heart relaxes. Blood pressure is classified into four stages. The following are the stages for adults who do not have a short-term serious illness or a chronic condition. Systolic pressure and diastolic pressure are measured in a unit called mm Hg. Normal  Systolic pressure: below 123456.  Diastolic pressure: below 80. Elevated  Systolic pressure: Q000111Q.  Diastolic pressure: below 80. Hypertension stage 1  Systolic pressure: 0000000.  Diastolic pressure: XX123456. Hypertension stage 2  Systolic pressure: XX123456 or above.  Diastolic pressure: 90 or above. You can have prehypertension or hypertension even if only the systolic or only the diastolic number in your reading  is higher than normal. Follow these instructions at home:  Check your blood pressure as often as recommended by your health care provider.  Take your monitor to the next appointment with your health care provider to make sure: ? That you are using it correctly. ? That it provides accurate readings.  Be sure you understand what your goal blood pressure numbers are.  Tell your health care provider if you are having any side effects from blood pressure medicine. Contact a health care provider if:  Your blood pressure is consistently high. Get help right away if:  Your systolic blood pressure is higher than 180.  Your diastolic blood pressure is higher than 110. This information is not intended to replace  advice given to you by your health care provider. Make sure you discuss any questions you have with your health care provider. Document Released: 04/21/2016 Document Revised: 10/26/2017 Document Reviewed: 04/21/2016 Elsevier Patient Education  [employer].    If you have lab work done today you will be contacted with your lab results within the next 2 weeks.  If you have not heard from [place] then please contact us. The fastest way to get your results is to register for My Chart.   IF you received an x-ray today, you will receive an invoice from [facility]. Please contact [facility] at [phone] with questions or concerns regarding your invoice.   IF you received labwork today, you will receive an invoice from [facility]. Please contact [facility] at [phone] with questions or concerns regarding your invoice.   Our billing staff will not be able to assist you with questions regarding bills from these companies.  You will be contacted with the lab results as soon as they are available. The fastest way to get your results is to activate your My Chart account. Instructions are located on the last page of this paperwork. If you have not heard from [place] regarding the results in 2 weeks, please contact this office.       Signed,   [name], MD [facility].  07/23/19 11:39 PM

## 2019-07-24 LAB — LIPID PANEL
Chol/HDL Ratio: 3.2 ratio (ref 0.0–4.4)
Cholesterol, Total: 189 mg/dL (ref 100–199)
HDL: 60 mg/dL (ref >39)
LDL Calculated: 103 mg/dL — ABNORMAL HIGH (ref 0–99)
Triglycerides: 128 mg/dL (ref 0–149)
VLDL Cholesterol Cal: 26 mg/dL (ref 5–40)

## 2019-07-24 LAB — COMPREHENSIVE METABOLIC PANEL
ALT: 14 IU/L (ref 0–32)
AST: 18 IU/L (ref 0–40)
Albumin/Globulin Ratio: 2.3 — ABNORMAL HIGH (ref 1.2–2.2)
Albumin: 4.5 g/dL (ref 3.8–4.8)
Alkaline Phosphatase: 68 IU/L (ref 39–117)
BUN/Creatinine Ratio: 16 (ref 12–28)
BUN: 13 mg/dL (ref 8–27)
Bilirubin Total: 0.6 mg/dL (ref 0.0–1.2)
CO2: 24 mmol/L (ref 20–29)
Calcium: 10.3 mg/dL (ref 8.7–10.3)
Chloride: 102 mmol/L (ref 96–106)
Creatinine, Ser: 0.83 mg/dL (ref 0.57–1.00)
GFR calc Af Amer: 83 mL/min/{1.73_m2} (ref >59)
GFR calc non Af Amer: 72 mL/min/{1.73_m2} (ref >59)
Globulin, Total: 2 g/dL (ref 1.5–4.5)
Glucose: 109 mg/dL — ABNORMAL HIGH (ref 65–99)
Potassium: 4.1 mmol/L (ref 3.5–5.2)
Sodium: 142 mmol/L (ref 134–144)
Total Protein: 6.5 g/dL (ref 6.0–8.5)

## 2019-07-24 LAB — HEMOGLOBIN A1C
Est. average glucose Bld gHb Est-mCnc: 117 mg/dL
Hgb A1c MFr Bld: 5.7 % — ABNORMAL HIGH (ref 4.8–5.6)

## 2019-07-25 ENCOUNTER — Telehealth: Payer: Self-pay | Admitting: Gastroenterology

## 2019-07-25 NOTE — Telephone Encounter (Signed)
Pt would like to speak with you regarding her appt next Tuesday. She states that she status has changed and she is not sure if she needs to postpone appt.

## 2019-07-25 NOTE — Telephone Encounter (Signed)
Spoke with the patient. She says she is "doing a whole lot better" on the Pantoprazole. She states she is not doing the chocking now. She will keep her follow up appointment because she would like to discuss reducing the medication.

## 2019-07-29 ENCOUNTER — Ambulatory Visit (INDEPENDENT_AMBULATORY_CARE_PROVIDER_SITE_OTHER): Payer: Medicare Other | Admitting: Gastroenterology

## 2019-07-29 ENCOUNTER — Encounter: Payer: Self-pay | Admitting: Gastroenterology

## 2019-07-29 VITALS — BP 137/30 | HR 95 | Temp 98.0°F | Ht 64.0 in | Wt 137.0 lb

## 2019-07-29 DIAGNOSIS — K582 Mixed irritable bowel syndrome: Secondary | ICD-10-CM

## 2019-07-29 DIAGNOSIS — K219 Gastro-esophageal reflux disease without esophagitis: Secondary | ICD-10-CM

## 2019-07-29 DIAGNOSIS — R1013 Epigastric pain: Secondary | ICD-10-CM

## 2019-07-29 MED ORDER — PANTOPRAZOLE SODIUM 40 MG PO TBEC: 40.0000 mg | DELAYED_RELEASE_TABLET | Freq: Two times a day (BID) | ORAL | 3 refills | Status: DC

## 2019-07-29 NOTE — Patient Instructions (Signed)
Use FDGard 1 capsule three times a day as needed  Take Pantoprazole 40 mg daily   Lactose-Free Diet, Adult If you have lactose intolerance, you are not able to digest lactose. Lactose is a natural sugar found mainly in dairy milk and dairy products. You may need to avoid all foods and beverages that contain lactose. A lactose-free diet can help you do this. Which foods have lactose? Lactose is found in dairy milk and dairy products, such as:  Yogurt.  Cheese.  Butter.  Margarine.  Sour cream.  Cream.  Whipped toppings and nondairy creamers.  Ice cream and other dairy-based desserts. Lactose is also found in foods or products made with dairy milk or milk ingredients. To find out whether a food contains dairy milk or a milk ingredient, look at the ingredients list. Avoid foods with the statement "May contain milk" and foods that contain:  Milk powder.  Whey.  Curd.  Caseinate.  Lactose.  Lactalbumin.  Lactoglobulin. What are alternatives to dairy milk and foods made with milk products?  Lactose-free milk.  Soy milk with added calcium and vitamin D.  Almond milk, coconut milk, rice milk, or other nondairy milk alternatives with added calcium and vitamin D. Note that these are low in protein.  Soy products, such as soy yogurt, soy cheese, soy ice cream, and soy-based sour cream.  Other nut milk products, such as almond yogurt, almond cheese, cashew yogurt, cashew cheese, cashew ice cream, coconut yogurt, and coconut ice cream. What are tips for following this plan?  Do not consume foods, beverages, vitamins, minerals, or medicines containing lactose. Read ingredient lists carefully.  Look for the words "lactose-free" on labels.  Use lactase enzyme drops or tablets as directed by your health care provider.  Use lactose-free milk or a milk alternative, such as soy milk or almond milk, for drinking and cooking.  Make sure you get enough calcium and vitamin D in  your diet. A lactose-free eating plan can be lacking in these important nutrients.  Take calcium and vitamin D supplements as directed by your health care provider. Talk to your health care provider about supplements if you are not able to get enough calcium and vitamin D from food. What foods can I eat?  Fruits All fresh, canned, frozen, or dried fruits that are not processed with lactose. Vegetables All fresh, frozen, and canned vegetables without cheese, cream, or butter sauces. Grains Any that are not made with dairy milk or dairy products. Meats and other proteins Any meat, fish, poultry, and other protein sources that are not made with dairy milk or dairy products. Soy cheese and yogurt. Fats and oils Any that are not made with dairy milk or dairy products. Beverages Lactose-free milk. Soy, rice, or almond milk with added calcium and vitamin D. Fruit and vegetable juices. Sweets and desserts Any that are not made with dairy milk or dairy products. Seasonings and condiments Any that are not made with dairy milk or dairy products. Calcium Calcium is found in many foods that contain lactose and is important for bone health. The amount of calcium you need depends on your age:  Adults younger than 50 years: 1,000 mg of calcium a day.  Adults older than 50 years: 1,200 mg of calcium a day. If you are not getting enough calcium, you may get it from other sources, including:  Orange juice with calcium added. There are 300-350 mg of calcium in 1 cup of orange juice.  Calcium-fortified soy milk. There  are 300-400 mg of calcium in 1 cup of calcium-fortified soy milk.  Calcium-fortified rice or almond milk. There are 300 mg of calcium in 1 cup of calcium-fortified rice or almond milk.  Calcium-fortified breakfast cereals. There are 100-1,000 mg of calcium in calcium-fortified breakfast cereals.  Spinach, cooked. There are 145 mg of calcium in  cup of cooked spinach.  Edamame,  cooked. There are 130 mg of calcium in  cup of cooked edamame.  Collard greens, cooked. There are 125 mg of calcium in  cup of cooked collard greens.  Kale, frozen or cooked. There are 90 mg of calcium in  cup of cooked or frozen kale.  Almonds. There are 95 mg of calcium in  cup of almonds.  Broccoli, cooked. There are 60 mg of calcium in 1 cup of cooked broccoli. The items listed above may not be a complete list of recommended foods and beverages. Contact a dietitian for more options. What foods are not recommended? Fruits None, unless they are made with dairy milk or dairy products. Vegetables None, unless they are made with dairy milk or dairy products. Grains Any grains that are made with dairy milk or dairy products. Meats and other proteins None, unless they are made with dairy milk or dairy products. Dairy All dairy products, including milk, goat's milk, buttermilk, kefir, acidophilus milk, flavored milk, evaporated milk, condensed milk, dulce de [place], eggnog, yogurt, cheese, and cheese spreads. Fats and oils Any that are made with milk or milk products. Margarines and salad dressings that contain milk or cheese. Cream. Half and half. Cream cheese. Sour cream. Chip dips made with sour cream or yogurt. Beverages Hot chocolate. Cocoa with lactose. Instant iced teas. Powdered fruit drinks. Smoothies made with dairy milk or yogurt. Sweets and desserts Any that are made with milk or milk products. Seasonings and condiments Chewing gum that has lactose. Spice blends if they contain lactose. Artificial sweeteners that contain lactose. Nondairy creamers. The items listed above may not be a complete list of foods and beverages to avoid. Contact a dietitian for more information. Summary  If you are lactose intolerant, it means that you have a hard time digesting lactose, a natural sugar found in milk and milk products.  Following a lactose-free diet can help you manage this  condition.  Calcium is important for bone health and is found in many foods that contain lactose. Talk with your health care provider about other sources of calcium. This information is not intended to replace advice given to you by your health care provider. Make sure you discuss any questions you have with your health care provider. Document Released: 05/05/2002 Document Revised: 12/11/2017 Document Reviewed: 12/11/2017 Elsevier Patient Education  2020 [place].   Gastroesophageal Reflux Disease, Adult Gastroesophageal reflux (GER) happens when acid from the stomach flows up into the tube that connects the mouth and the stomach (esophagus). Normally, food travels down the esophagus and stays in the stomach to be digested. However, when a person has GER, food and stomach acid sometimes move back up into the esophagus. If this becomes a more serious problem, the person may be diagnosed with a disease called gastroesophageal reflux disease (GERD). GERD occurs when the reflux:  Happens often.  Causes frequent or severe symptoms.  Causes problems such as damage to the esophagus. When stomach acid comes in contact with the esophagus, the acid may cause soreness (inflammation) in the esophagus. Over time, GERD may create small holes (ulcers) in the lining of the  esophagus. What are the causes? This condition is caused by a problem with the muscle between the esophagus and the stomach (lower esophageal sphincter, or LES). Normally, the LES muscle closes after food passes through the esophagus to the stomach. When the LES is weakened or abnormal, it does not close properly, and that allows food and stomach acid to go back up into the esophagus. The LES can be weakened by certain dietary substances, medicines, and medical conditions, including:  Tobacco use.  Pregnancy.  Having a hiatal hernia.  Alcohol use.  Certain foods and beverages, such as coffee, chocolate, onions, and  peppermint. What increases the risk? You are more likely to develop this condition if you:  Have an increased body weight.  Have a connective tissue disorder.  Use NSAID medicines. What are the signs or symptoms? Symptoms of this condition include:  Heartburn.  Difficult or painful swallowing.  The feeling of having a lump in the throat.  Abitter taste in the mouth.  Bad breath.  Having a large amount of saliva.  Having an upset or bloated stomach.  Belching.  Chest pain. Different conditions can cause chest pain. Make sure you see your health care provider if you experience chest pain.  Shortness of breath or wheezing.  Ongoing (chronic) cough or a night-time cough.  Wearing away of tooth enamel.  Weight loss. How is this diagnosed? Your health care provider will take a medical history and perform a physical exam. To determine if you have mild or severe GERD, your health care provider may also monitor how you respond to treatment. You may also have tests, including:  A test to examine your stomach and esophagus with a small camera (endoscopy).  A test thatmeasures the acidity level in your esophagus.  A test thatmeasures how much pressure is on your esophagus.  A barium swallow or modified barium swallow test to show the shape, size, and functioning of your esophagus. How is this treated? The goal of treatment is to help relieve your symptoms and to prevent complications. Treatment for this condition may vary depending on how severe your symptoms are. Your health care provider may recommend:  Changes to your diet.  Medicine.  Surgery. Follow these instructions at home: Eating and drinking   Follow a diet as recommended by your health care provider. This may involve avoiding foods and drinks such as: ? Coffee and tea (with or without caffeine). ? Drinks that containalcohol. ? Energy drinks and sports drinks. ? Carbonated drinks or sodas. ? Chocolate  and cocoa. ? Peppermint and mint flavorings. ? Garlic and onions. ? Horseradish. ? Spicy and acidic foods, including peppers, chili powder, curry powder, vinegar, hot sauces, and barbecue sauce. ? Citrus fruit juices and citrus fruits, such as oranges, lemons, and limes. ? Tomato-based foods, such as red sauce, chili, salsa, and pizza with red sauce. ? Fried and fatty foods, such as donuts, french fries, potato chips, and high-fat dressings. ? High-fat meats, such as hot dogs and fatty cuts of red and white meats, such as rib eye steak, sausage, ham, and bacon. ? High-fat dairy items, such as whole milk, butter, and cream cheese.  Eat small, frequent meals instead of large meals.  Avoid drinking large amounts of liquid with your meals.  Avoid eating meals during the 2-3 hours before bedtime.  Avoid lying down right after you eat.  Do not exercise right after you eat. Lifestyle   Do not use any products that contain nicotine or tobacco,  such as cigarettes, e-cigarettes, and chewing tobacco. If you need help quitting, ask your health care provider.  Try to reduce your stress by using methods such as yoga or meditation. If you need help reducing stress, ask your health care provider.  If you are overweight, reduce your weight to an amount that is healthy for you. Ask your health care provider for guidance about a safe weight loss goal. General instructions  Pay attention to any changes in your symptoms.  Take over-the-counter and prescription medicines only as told by your health care provider. Do not take aspirin, ibuprofen, or other NSAIDs unless your health care provider told you to do so.  Wear loose-fitting clothing. Do not wear anything tight around your waist that causes pressure on your abdomen.  Raise (elevate) the head of your bed about 6 inches (15 cm).  Avoid bending over if this makes your symptoms worse.  Keep all follow-up visits as told by your health care  provider. This is important. Contact a health care provider if:  You have: ? New symptoms. ? Unexplained weight loss. ? Difficulty swallowing or it hurts to swallow. ? Wheezing or a persistent cough. ? A hoarse voice.  Your symptoms do not improve with treatment. Get help right away if you:  Have pain in your arms, neck, jaw, teeth, or back.  Feel sweaty, dizzy, or light-headed.  Have chest pain or shortness of breath.  Vomit and your vomit looks like blood or coffee grounds.  Faint.  Have stool that is bloody or black.  Cannot swallow, drink, or eat. Summary  Gastroesophageal reflux happens when acid from the stomach flows up into the esophagus. GERD is a disease in which the reflux happens often, causes frequent or severe symptoms, or causes problems such as damage to the esophagus.  Treatment for this condition may vary depending on how severe your symptoms are. Your health care provider may recommend diet and lifestyle changes, medicine, or surgery.  Contact a health care provider if you have new or worsening symptoms.  Take over-the-counter and prescription medicines only as told by your health care provider. Do not take aspirin, ibuprofen, or other NSAIDs unless your health care provider told you to do so.  Keep all follow-up visits as told by your health care provider. This is important. This information is not intended to replace advice given to you by your health care provider. Make sure you discuss any questions you have with your health care provider. Document Released: 08/23/2005 Document Revised: 05/22/2018 Document Reviewed: 05/22/2018 Elsevier Patient Education  [place].  I appreciate the  opportunity to care for you  Thank You   Harl Bowie , MD

## 2019-07-29 NOTE — Progress Notes (Signed)
Breanna Sharp    EB:[id number]    02-20-1951  Primary Care Physician:Greene, Ranell Patrick, MD  Referring Physician: Wendie Agreste, MD [address] [place],  [place] [address]   Chief complaint: Dysphagia HPI: 68 year old female here for follow-up visit She is no longer having trouble swallowing.  Denies any odynophagia, nausea or vomiting. She has intermittent dyspepsia. Bowel habits at baseline with alternating constipation and diarrhea, denies any melena or blood per rectum.   EGD May 26, 2019 showed normal esophagus with irregular Z line, mild gastritis biopsies negative for H. pylori, dysplasia, intestinal metaplasia or malignancy.  Normal duodenum  Colonoscopy September 12, 2017: Sessile polyps and left-sided diverticulosis.  Outpatient Encounter Medications as of 07/29/2019  Medication Sig  . cholecalciferol (VITAMIN D) 1000 UNITS tablet Take 2,000 Units by mouth daily.   [name] ezetimibe (ZETIA) 10 MG tablet Take 1 tablet (10 mg total) by mouth daily.  . fexofenadine (ALLEGRA) 180 MG tablet Take by mouth.  . fluticasone (FLONASE) 50 MCG/ACT nasal spray Place 2 sprays into both nostrils daily.  [name] lisinopril-hydrochlorothiazide (ZESTORETIC) 20-12.5 MG tablet Take 1 tablet by mouth daily.  . pantoprazole (PROTONIX) 40 MG tablet Take 1 tablet (40 mg total) by mouth 2 (two) times daily.  . Pitavastatin Calcium (LIVALO) 1 MG TABS Take 1 tablet (1 mg total) by mouth daily.  Vladimir Faster Glycol-Propyl Glycol (SYSTANE ULTRA OP) Apply 2 drops to eye daily.  . Tiotropium Bromide Monohydrate (SPIRIVA RESPIMAT) 2.5 MCG/ACT AERS Take 2 puffs by mouth daily.  . Travoprost, BAK Free, (TRAVATAN) 0.004 % SOLN ophthalmic solution Place 1 drop into both eyes at bedtime.   No facility-administered encounter medications on file as of 07/29/2019.     Allergies as of 07/29/2019 - Review Complete 07/23/2019  Allergen Reaction Noted  . Statins  05/15/2012    Past Medical History:   Diagnosis Date  . Allergy   . Anxiety   . Arthritis   . Cancer ([place])   . Dyslipidemia    statin intolerance  . GERD (gastroesophageal reflux disease)   . Heart murmur   . Hyperlipidemia   . Hypertension   . Vertigo     Past Surgical History:  Procedure Laterality Date  . ABDOMINAL HYSTERECTOMY  1972   cervical cancer  . COLONOSCOPY    . WISDOM TOOTH EXTRACTION     uppers    Family History  Problem Relation Age of Onset  . Hypertension Mother        dx in her 33's  . Heart attack Mother 81  . Breast cancer Mother        unsure of age  . Heart attack Brother 79  . Diabetes Brother   . Hyperlipidemia Brother   . Hypertension Brother   . Heart disease Brother   . Colon cancer Neg Hx   . Esophageal cancer Neg Hx   . Rectal cancer Neg Hx   . Stomach cancer Neg Hx     Social History   Socioeconomic History  . Marital status: Married    Spouse name: [name]  . Number of children: 0  . Years of education: 61  . Highest education level: Not on file  Occupational History    Employer: RETIRED    Comment: Retired  Scientific laboratory technician  . Financial resource strain: Not on file  . Food insecurity    Worry: Not on file    Inability: Not on file  .  Transportation needs    Medical: Not on file    Non-medical: Not on file  Tobacco Use  . Smoking status: Former Smoker    Packs/day: 0.50    Years: 32.00    Pack years: 16.00    Types: Cigarettes    Quit date: 11/27/2002    Years since quitting: 16.6  . Smokeless tobacco: Never Used  Substance and Sexual Activity  . Alcohol use: Yes    Alcohol/week: 3.0 standard drinks    Types: 3 Standard drinks or equivalent per week    Comment: social - 2 or 3 times a month 1-2 glasses of wine  . Drug use: No  . Sexual activity: Yes    Partners: Male  Lifestyle  . Physical activity    Days per week: Not on file    Minutes per session: Not on file  . Stress: Not on file  Relationships  . Social Herbalist on phone: Not  on file    Gets together: Not on file    Attends religious service: Not on file    Active member of club or organization: Not on file    Attends meetings of clubs or organizations: Not on file    Relationship status: Not on file  . Intimate partner violence    Fear of current or ex partner: Not on file    Emotionally abused: Not on file    Physically abused: Not on file    Forced sexual activity: Not on file  Other Topics Concern  . Not on file  Social History Narrative   Exercise 3 to 4 times/week walking for 45 min -1 hour   Patient lives at home with her husband Legrand [place]).   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   Right handed.   decaffeine Green Tea.      Review of systems: Review of Systems  Constitutional: Negative for fever and chills.  HENT: Positive Sinus problem Eyes: Negative for blurred vision.  Respiratory: Negative for cough, shortness of breath and wheezing.   Cardiovascular: Negative for chest pain and palpitations.  Gastrointestinal: as per HPI Genitourinary: Negative for dysuria, urgency, frequency and hematuria.  Musculoskeletal: Positive for myalgias, back pain and joint pain.  Skin: Negative for itching and rash.  Neurological: Negative for dizziness, tremors, focal weakness, seizures and loss of consciousness.  Endo/Heme/Allergies: Positive for seasonal allergies.  Psychiatric/Behavioral: Negative for depression, suicidal ideas and hallucinations.  All other systems reviewed and are negative.   Physical Exam: Vitals:   07/29/19 0818  BP: (!) 137/30  Pulse: 95  Temp: 98 F (36.7 C)   Body mass index is 23.52 kg/m. Gen:      No acute distress HEENT:  EOMI, sclera anicteric Neck:     No masses; no thyromegaly Lungs:    Clear to auscultation bilaterally; normal respiratory effort CV:         Regular rate and rhythm; no murmurs Abd:      + bowel sounds;  soft, non-tender; no palpable masses, no distension Ext:    No edema; adequate peripheral perfusion Skin:      Warm and dry; no rash Neuro: alert and oriented x 3 Psych: normal mood and affect  Data Reviewed:  Reviewed labs, radiology imaging, old records and pertinent past GI work up   Assessment and Plan/Recommendations:  68 year old female with chronic GERD, IBS with alternating  constipation and diarrhea Possible lactose intolerance We will do a trial of lactose-free diet for 1 to 2 weeks Intermittent dyspepsia trial of FD guard 1 capsule up to 3 times daily as needed GERD continue Protonix 40 mg daily and antireflux measures  Return in 3 months or sooner if needed  25 minutes was spent face-to-face with the patient. Greater than 50% of the time used for counseling as well as treatment plan and follow-up. She had multiple questions which were answered to her satisfaction  K. Denzil Magnuson , MD    CC: Wendie Agreste, MD

## 2019-07-30 DIAGNOSIS — M531 Cervicobrachial syndrome: Secondary | ICD-10-CM | POA: Diagnosis not present

## 2019-07-30 DIAGNOSIS — M9902 Segmental and somatic dysfunction of thoracic region: Secondary | ICD-10-CM | POA: Diagnosis not present

## 2019-07-30 DIAGNOSIS — M9901 Segmental and somatic dysfunction of cervical region: Secondary | ICD-10-CM | POA: Diagnosis not present

## 2019-07-30 DIAGNOSIS — M5134 Other intervertebral disc degeneration, thoracic region: Secondary | ICD-10-CM | POA: Diagnosis not present

## 2019-08-08 ENCOUNTER — Encounter: Payer: Self-pay | Admitting: Gastroenterology

## 2019-08-08 ENCOUNTER — Telehealth: Payer: Self-pay | Admitting: Gastroenterology

## 2019-08-08 ENCOUNTER — Other Ambulatory Visit: Payer: Self-pay

## 2019-08-08 MED ORDER — OMEPRAZOLE 20 MG PO TBEC: 40.0000 mg | DELAYED_RELEASE_TABLET | Freq: Every day | ORAL | 3 refills | Status: DC

## 2019-08-08 NOTE — Telephone Encounter (Signed)
She reports she has indigestion or heartburn that begins during her breakfast. She is taking her PPI as ordered. No dairy products as directed. Walking for exercise. FDgard up to 3 times a day. She reports her indigestion Breanna Sharp will start during breakfast. FDgard lessens it but does not totally relieve it. This continues through the day. She repeats FDgard at lunch. Sometimes she has to repeat the FDgard at the evening meal. She is having 1 to 2 bowel movements daily. She is walking for exercise and has lost weight. She calls back to let me know she is considered pre-diabetic.

## 2019-08-08 NOTE — Telephone Encounter (Signed)
Explained the plan to the patient. Rx to the pharmacy as Prilosec 20mg  two once daily before breakfast. Patient has issues swallowing capsules.

## 2019-08-08 NOTE — Telephone Encounter (Signed)
Okay to continue FD guard 3 times daily with meals as needed.  Please check if insurance will cover omeprazole 40 mg daily, if yes we will switch it to that.  She can start taking Gaviscon 1 tablet after meals 3 times daily as needed if still has persistent heartburn or dyspepsia.  Follow-up in office visit next available in 6 to 8 weeks.

## 2019-08-19 ENCOUNTER — Ambulatory Visit: Payer: Medicare Other | Admitting: Family Medicine

## 2019-08-28 ENCOUNTER — Encounter: Payer: Self-pay | Admitting: Internal Medicine

## 2019-08-28 ENCOUNTER — Ambulatory Visit (INDEPENDENT_AMBULATORY_CARE_PROVIDER_SITE_OTHER): Payer: Medicare Other | Admitting: Internal Medicine

## 2019-08-28 ENCOUNTER — Other Ambulatory Visit: Payer: Self-pay

## 2019-08-28 ENCOUNTER — Telehealth: Payer: Self-pay | Admitting: Family Medicine

## 2019-08-28 VITALS — BP 132/76 | HR 73 | Ht 64.0 in | Wt 138.0 lb

## 2019-08-28 DIAGNOSIS — J449 Chronic obstructive pulmonary disease, unspecified: Secondary | ICD-10-CM

## 2019-08-28 DIAGNOSIS — I10 Essential (primary) hypertension: Secondary | ICD-10-CM

## 2019-08-28 DIAGNOSIS — E785 Hyperlipidemia, unspecified: Secondary | ICD-10-CM | POA: Diagnosis not present

## 2019-08-28 MED ORDER — LIVALO 1 MG PO TABS: 1.0000 mg | ORAL_TABLET | Freq: Every day | ORAL | 3 refills | Status: DC

## 2019-08-28 MED ORDER — EZETIMIBE 10 MG PO TABS: 10.0000 mg | ORAL_TABLET | Freq: Every day | ORAL | 3 refills | Status: DC

## 2019-08-28 NOTE — Patient Instructions (Signed)
Medication Instructions:  Your physician recommends that you continue on your current medications as directed. Please refer to the Current Medication list given to you today.  If you need a refill on your cardiac medications before your next appointment, please call your pharmacy.   Lab work: NONE If you have labs (blood work) drawn today and your tests are completely normal, you will receive your results only by: . MyChart Message (if you have MyChart) OR . A paper copy in the mail If you have any lab test that is abnormal or we need to change your treatment, we will call you to review the results.  Testing/Procedures: NONE  Follow-Up: At [facility], you and your health needs are our priority.  As part of our continuing mission to provide you with exceptional heart care, we have created designated Provider Care Teams.  These Care Teams include your primary Cardiologist (physician) and Advanced Practice Providers (APPs -  Physician Assistants and Nurse Practitioners) who all work together to provide you with the care you need, when you need it. You will need a follow up appointment in 12 months.  Please call our office 2 months in advance to schedule this appointment.  You may see Dr. Hilty or one of the following Advanced Practice Providers on your designated Care Team: Hao Meng, PA-C . Angela Duke, PA-C  Any Other Special Instructions Will Be Listed Below (If Applicable).    

## 2019-08-28 NOTE — Progress Notes (Signed)
OFFICE NOTE  Chief Complaint:  Annual follow-up  Primary Care Physician: Wendie Agreste, MD  HPI:  Breanna Sharp is a pleasant 68 year old female with a history of hypertension and dyslipidemia. Unfortunately she's been intolerant to statins in the past and has failed both Lipitor Zocor Crestor and pravastatin. She is reported some increasing shortness of breath mostly when walking upstairs, however has been struggling with recurrent sinusitis and upper respiratory symptoms. Based on her risk factors, recommended metabolic testing. She underwent cardiopulmonary testing on 04/22/2013. She had maximal effort of 1.08 our ER. Peak VO2 was 108% predicted. Heart rate was 94% predicted. Her heart rate in view to curves were essentially normal with late flattening of her view to curve. Overall the study is low risk. She also went underwent palmar he function testing which showed normal diffusion, volume and flow loops. At her last visit she started taking Livalo samples due to an abnormal lipid profile. Her total cholesterol was 213, triglycerides 122, HDL 55 and LDL 134. Over the intial month on Livalo, she seemed to tolerate it.   Then she reported some right flank pain, especially when lifting weights, which he had started to do at the same time. She stopped her cholesterol medicine and stopped her exercise and her symptoms improved. She then restarted her exercise and had symptoms and realized that it was not likely the cholesterol medicine that was causing her right side pain. Her repeat cholesterol profile was abnormally high on only Zetia monotherapy, as she discontinued Livalo due to cramps. Her LDL particle number was 3189, LDL content was 190, HDL C. was 60 and triglycerides 148.    Breanna Sharp returns today for followup of her lipid profile. Her lipid profile in April was markedly improved with an LDL particle #1594, LDL content 105, HDL 51 and triglycerides 142. She reports some  improvement in her shortness of breath and has managed to work on exercise and has had 12 pounds of weight loss.  I saw Breanna Sharp back in the office today. Overall she is doing well except she still has some shortness of breath despite weight loss, particularly when walking up inclines. We will not been able to really establish the reason for this. Heart rate did go up to a very high level with exercise, 94% predicted on cardiopulmonary exercise testing in 2014. Is not clear whether this is an arrhythmia or just sinus tachycardia. This could explain why she feels worse when walking up hills. I like to see if we can re-create this with exercise treadmill stress testing. If there is a sharp increase in heart rate with exercise, she may benefit from a low-dose beta blocker.  05/11/2016  Breanna Sharp returns today for follow-up. She is without any significant complaints. Blood pressure was mildly elevated at 133/87 however repeat was 128/78. She is not having any problems on that area and Livalo. She is due for repeat cholesterol test. After an extensive workup we cannot find a cause from a cardiac standpoint of her shortness of breath. She was referred to [facility] and saw Dr. Vaughan Browner who performed palmar function test and feels that she might have some COPD. She's been on Spiriva and reports improvement in her shortness of breath.  06/06/2017  Breanna Sharp was seen today in follow-up. She is overall without complaints. Denies any shortness of breath or chest pain. She reports her COPD is improved significantly. She's managed to lose some weight and exercising regularly. Blood pressure is  reasonable today 134/72.  08/27/2018  Breanna Sharp returns today for follow-up.  She is done extremely well over the past year.  Her shortness of breath has been improved on inhalers.  Her COPD is stable.  Weight is fairly stable.  She exercises regularly walking between 4 and 8 miles a day.  She denies  any chest pain.  She is also changed her eating habits and recently had a repeat lipid profile which is the best that she is ever seen.  Her total cholesterol is 167, triglycerides 107, HDL 57 LDL 89.  08/28/2019  Breanna Sharp seen today for annual follow-up.  Overall she continues to do well.  She is had no worsening COPD.  She does get a little congestion which is seasonal.  She continues to walk 5 to 7 miles almost every day.  She denies any claudication.  She denies any worsening shortness of breath or chest pain.  Her lipids have been well controlled and LDL is a little higher at 103 but stable compared to study 7 months ago.  She was much better controlled about a year ago with LDL at 89.  She is on both Livalo and ezetimibe.  PMHx:  Past Medical History:  Diagnosis Date  . Allergy   . Anxiety   . Arthritis   . Cancer ([place])   . Dyslipidemia    statin intolerance  . GERD (gastroesophageal reflux disease)   . Heart murmur   . Hyperlipidemia   . Hypertension   . Vertigo     Past Surgical History:  Procedure Laterality Date  . ABDOMINAL HYSTERECTOMY  1972   cervical cancer  . COLONOSCOPY    . WISDOM TOOTH EXTRACTION     uppers    FAMHx:  Family History  Problem Relation Age of Onset  . Hypertension Mother        dx in her 61's  . Heart attack Mother 71  . Breast cancer Mother        unsure of age  . Heart attack Brother 3  . Diabetes Brother   . Hyperlipidemia Brother   . Hypertension Brother   . Heart disease Brother   . Colon cancer Neg Hx   . Esophageal cancer Neg Hx   . Rectal cancer Neg Hx   . Stomach cancer Neg Hx     SOCHx:   reports that she quit smoking about 16 years ago. Her smoking use included cigarettes. She has a 16.00 pack-year smoking history. She has never used smokeless tobacco. She reports current alcohol use of about 3.0 standard drinks of alcohol per week. She reports that she does not use drugs.  ALLERGIES:  Allergies  Allergen  Reactions  . Statins     Muscle cramps    ROS: Pertinent items noted in HPI and remainder of comprehensive ROS otherwise negative.  HOME MEDS: Current Outpatient Medications  Medication Sig Dispense Refill  . cholecalciferol (VITAMIN D) 1000 UNITS tablet Take 2,000 Units by mouth daily.     [name] ezetimibe (ZETIA) 10 MG tablet Take 1 tablet (10 mg total) by mouth daily. 90 tablet 0  . fexofenadine (ALLEGRA) 180 MG tablet Take by mouth.    . fluticasone (FLONASE) 50 MCG/ACT nasal spray Place 2 sprays into both nostrils daily. 48 g 6  . lisinopril-hydrochlorothiazide (ZESTORETIC) 20-12.5 MG tablet Take 1 tablet by mouth daily. 90 tablet 1  . Omeprazole 20 MG TBEC Take 2 tablets (40 mg total) by mouth daily before breakfast. 60  tablet 3  . Polyethyl Glycol-Propyl Glycol (SYSTANE ULTRA OP) Apply 2 drops to eye daily.    . Tiotropium Bromide Monohydrate (SPIRIVA RESPIMAT) 2.5 MCG/ACT AERS Take 2 puffs by mouth daily. 12 g 1  . Travoprost, BAK Free, (TRAVATAN) 0.004 % SOLN ophthalmic solution Place 1 drop into both eyes at bedtime.     No current facility-administered medications for this visit.     LABS/IMAGING: No results found for this or any previous visit (from the past 48 hour(s)). No results found.  VITALS: BP 132/76 (BP Location: Left Arm, Patient Position: Sitting, Cuff Size: Normal)   Pulse 73   Ht 5\' 4"  (1.626 m)   Wt 138 lb (62.6 kg)   BMI 23.69 kg/m   EXAM: General appearance: alert and no distress Neck: no carotid bruit and no JVD Lungs: clear to auscultation bilaterally Heart: regular rate and rhythm, S1, S2 normal, no murmur, click, rub or gallop Abdomen: soft, non-tender; bowel sounds normal; no masses,  no organomegaly Extremities: extremities normal, atraumatic, no cyanosis or edema Pulses: 2+ and symmetric Skin: Skin color, texture, turgor normal. No rashes or lesions Neurologic: GroPsych: Pleasanty normal Psych: Pleasant  EKG: Normal sinus rhythm at 73,  nonspecific T wave changes-personally reviewed  ASSESSMENT: 1. Dyslipidemia 2.   Hypertension 3.   COPD - Gold A  PLAN: 1.   Mrs. Scholle continues to do well without worsening chest pain and stable shortness of breath related to COPD.  Blood pressure is at goal.  Her cholesterol is slightly higher with LDL 103.  She has been compliant with medications.  We discussed dietary modifications going forward.  She is very active and has actually lost weight recently.  We will continue current therapy without changes in medications.  Plan follow-up annually or sooner as necessary.  Pixie Casino, MD, [facility], [place] Director of the [facility]  [facility] Diplomate of the American Board of Clinical Lipidology Attending Cardiologist  Direct Dial: [phone]  Fax: [phone]  Website:  [email or web address] 08/28/2019, 8:57 AM

## 2019-08-28 NOTE — Telephone Encounter (Signed)
Pt dropped off lab results in an envelope at the front desk. Envelope has been placed in providers box at nurses station

## 2019-09-02 ENCOUNTER — Telehealth: Payer: Self-pay | Admitting: Gastroenterology

## 2019-09-02 DIAGNOSIS — Z90710 Acquired absence of both cervix and uterus: Secondary | ICD-10-CM | POA: Diagnosis not present

## 2019-09-02 DIAGNOSIS — N898 Other specified noninflammatory disorders of vagina: Secondary | ICD-10-CM | POA: Diagnosis not present

## 2019-09-02 DIAGNOSIS — C55 Malignant neoplasm of uterus, part unspecified: Secondary | ICD-10-CM | POA: Diagnosis not present

## 2019-09-02 DIAGNOSIS — Z124 Encounter for screening for malignant neoplasm of cervix: Secondary | ICD-10-CM | POA: Diagnosis not present

## 2019-09-02 DIAGNOSIS — Z6823 Body mass index (BMI) 23.0-23.9, adult: Secondary | ICD-10-CM | POA: Diagnosis not present

## 2019-09-02 NOTE — Telephone Encounter (Signed)
Last office visit was in September.  Patient reports she is having a choking sensation again. She is taking Prilosec in the morning 30 minutes before she eats. The sensation of choking will develop later in the morning. It occurs without active swallowing and it is not in relation to a meal. She wakes up with it sometimes. She does not vomit, belch or burp. Please advise.

## 2019-09-02 NOTE — Telephone Encounter (Signed)
Pt requested a call back to discuss a choking sensation and indigestion.

## 2019-09-03 NOTE — Telephone Encounter (Signed)
Please advise her to increase PPI to twice daily, please send new Rx for Omeprazole 40mg  before breakfast and dinner. Follow up in office next available appointment. Thanks

## 2019-09-08 ENCOUNTER — Ambulatory Visit
Admission: RE | Admit: 2019-09-08 | Discharge: 2019-09-08 | Disposition: A | Discharge status: Home / Self Care | Payer: Medicare Other | Source: Ambulatory Visit | Attending: Family Medicine | Admitting: Family Medicine

## 2019-09-08 ENCOUNTER — Other Ambulatory Visit: Payer: Self-pay

## 2019-09-08 DIAGNOSIS — Z1231 Encounter for screening mammogram for malignant neoplasm of breast: Secondary | ICD-10-CM

## 2019-09-15 ENCOUNTER — Other Ambulatory Visit: Payer: Self-pay

## 2019-09-15 ENCOUNTER — Telehealth: Payer: Self-pay | Admitting: Family Medicine

## 2019-09-15 ENCOUNTER — Telehealth: Payer: Self-pay | Admitting: *Deleted

## 2019-09-15 ENCOUNTER — Encounter: Payer: Medicare Other | Admitting: Family Medicine

## 2019-09-15 DIAGNOSIS — I10 Essential (primary) hypertension: Secondary | ICD-10-CM

## 2019-09-15 NOTE — Telephone Encounter (Signed)
Doing better on new dose of lisinopril. Home readings brought in. No change in vertigo symptoms with higher dose. Plans to have lab only visit for repeat creatinine, and will schedule AWV next week.

## 2019-09-15 NOTE — Telephone Encounter (Signed)
Home blood pressure readings reviewed from paperwork patient brought in, since increasing lisinopril dose September 7th.  Systolic [id number] diastolic [id number].

## 2019-09-15 NOTE — Telephone Encounter (Signed)
Pt needs a AWV app scheduled sometime next week per Dr. [name].

## 2019-09-16 NOTE — Telephone Encounter (Signed)
Left message to Schedule AWV

## 2019-09-17 DIAGNOSIS — R87615 Unsatisfactory cytologic smear of cervix: Secondary | ICD-10-CM | POA: Diagnosis not present

## 2019-09-18 ENCOUNTER — Ambulatory Visit (INDEPENDENT_AMBULATORY_CARE_PROVIDER_SITE_OTHER): Payer: Medicare Other | Admitting: Gastroenterology

## 2019-09-18 ENCOUNTER — Other Ambulatory Visit: Payer: Self-pay

## 2019-09-18 ENCOUNTER — Encounter: Payer: Self-pay | Admitting: Gastroenterology

## 2019-09-18 ENCOUNTER — Telehealth: Payer: Self-pay | Admitting: Gastroenterology

## 2019-09-18 VITALS — BP 140/72 | HR 71 | Temp 97.1°F | Ht 63.5 in | Wt 132.6 lb

## 2019-09-18 DIAGNOSIS — K5900 Constipation, unspecified: Secondary | ICD-10-CM | POA: Diagnosis not present

## 2019-09-18 DIAGNOSIS — R1013 Epigastric pain: Secondary | ICD-10-CM | POA: Diagnosis not present

## 2019-09-18 DIAGNOSIS — K219 Gastro-esophageal reflux disease without esophagitis: Secondary | ICD-10-CM | POA: Diagnosis not present

## 2019-09-18 DIAGNOSIS — R1310 Dysphagia, unspecified: Secondary | ICD-10-CM | POA: Diagnosis not present

## 2019-09-18 NOTE — Patient Instructions (Addendum)
You have been scheduled for an esophageal manometry at [facility] on 09/29/2019 at 10:30am. Please arrive 30 minutes prior to your procedure for registration. You will need to go to outpatient registration (1st floor of the hospital) first. Make certain to bring your insurance cards as well as a complete list of medications.  Please remember the following:  STAY ON PPI   1) Do not take any muscle relaxants, xanax (alprazolam) or ativan for 1 day prior to your test as well as the day of the test.  2) Nothing to eat or drink for 4 hours before your test.  3) Hold all diabetic medications/insulin the morning of the test. You may eat and take your medications after the test.  It will take at least 2 weeks to receive the results of this test from your physician. ------------------------------------------ ABOUT ESOPHAGEAL MANOMETRY Esophageal manometry (muh-NOM-uh-tree) is a test that gauges how well your esophagus works. Your esophagus is the long, muscular tube that connects your throat to your stomach. Esophageal manometry measures the rhythmic muscle contractions (peristalsis) that occur in your esophagus when you swallow. Esophageal manometry also measures the coordination and force exerted by the muscles of your esophagus.  During esophageal manometry, a thin, flexible tube (catheter) that contains sensors is passed through your nose, down your esophagus and into your stomach. Esophageal manometry can be helpful in diagnosing some mostly uncommon disorders that affect your esophagus.  Why it's done Esophageal manometry is used to evaluate the movement (motility) of food through the esophagus and into the stomach. The test measures how well the circular bands of muscle (sphincters) at the top and bottom of your esophagus open and close, as well as the pressure, strength and pattern of the wave of esophageal muscle contractions that moves food along.  What you can expect Esophageal  manometry is an outpatient procedure done without sedation. Most people tolerate it well. You may be asked to change into a hospital gown before the test starts.  During esophageal manometry  . While you are sitting up, a member of your health care team sprays your throat with a numbing medication or puts numbing gel in your nose or both.  . A catheter is guided through your nose into your esophagus. The catheter may be sheathed in a water-filled sleeve. It doesn't interfere with your breathing. However, your eyes may water, and you may gag. You may have a slight nosebleed from irritation.  . After the catheter is in place, you may be asked to lie on your back on an exam table, or you may be asked to remain seated.  . You then swallow small sips of water. As you do, a computer connected to the catheter records the pressure, strength and pattern of your esophageal muscle contractions.  . During the test, you'll be asked to breathe slowly and smoothly, remain as still as possible, and swallow only when you're asked to do so.  . A member of your health care team may move the catheter down into your stomach while the catheter continues its measurements.  . The catheter then is slowly withdrawn. The test usually lasts 20 to 30 minutes.  After esophageal manometry  When your esophageal manometry is complete, you may return to your normal activities  This test typically takes 30-45 minutes to complete. ________________________________________________________________________________  Due to recent COVID-19 restrictions implemented by our local and state authorities and in an effort to keep both patients and staff as safe as possible, our  hospital system now requires COVID-19 testing prior to any scheduled hospital procedure. Please go to our [facility] location drive thru testing site ([address], [place], [place] [address]) on 09/25/2019 at  10:20am. There will be multiple testing areas, the  first checkpoint being for pre-procedure/surgery testing. Get into the right (yellow) lane that leads to the PAT testing team. You will not be billed at the time of testing but may receive a bill later depending on your insurance. The approximate cost of the test is $100. You must agree to quarantine from the time of your testing until the procedure date on 09/29/2019 . This should include staying at home with ONLY the people you live with. Avoid take-out, grocery store shopping or leaving the house for any non-emergent reason. Failure to have your COVID-19 test done on the date and time you have been scheduled will result in cancellation of procedure. Please call our office at [phone] if you have any questions.    Gastroesophageal Reflux Disease, Adult Gastroesophageal reflux (GER) happens when acid from the stomach flows up into the tube that connects the mouth and the stomach (esophagus). Normally, food travels down the esophagus and stays in the stomach to be digested. However, when a person has GER, food and stomach acid sometimes move back up into the esophagus. If this becomes a more serious problem, the person may be diagnosed with a disease called gastroesophageal reflux disease (GERD). GERD occurs when the reflux: Happens often. Causes frequent or severe symptoms. Causes problems such as damage to the esophagus. When stomach acid comes in contact with the esophagus, the acid may cause soreness (inflammation) in the esophagus. Over time, GERD may create small holes (ulcers) in the lining of the esophagus. What are the causes? This condition is caused by a problem with the muscle between the esophagus and the stomach (lower esophageal sphincter, or LES). Normally, the LES muscle closes after food passes through the esophagus to the stomach. When the LES is weakened or abnormal, it does not close properly, and that allows food and stomach acid to go back up into the esophagus. The LES can be  weakened by certain dietary substances, medicines, and medical conditions, including: Tobacco use. Pregnancy. Having a hiatal hernia. Alcohol use. Certain foods and beverages, such as coffee, chocolate, onions, and peppermint. What increases the risk? You are more likely to develop this condition if you: Have an increased body weight. Have a connective tissue disorder. Use NSAID medicines. What are the signs or symptoms? Symptoms of this condition include: Heartburn. Difficult or painful swallowing. The feeling of having a lump in the throat. Abitter taste in the mouth. Bad breath. Having a large amount of saliva. Having an upset or bloated stomach. Belching. Chest pain. Different conditions can cause chest pain. Make sure you see your health care provider if you experience chest pain. Shortness of breath or wheezing. Ongoing (chronic) cough or a night-time cough. Wearing away of tooth enamel. Weight loss. How is this diagnosed? Your health care provider will take a medical history and perform a physical exam. To determine if you have mild or severe GERD, your health care provider may also monitor how you respond to treatment. You may also have tests, including: A test to examine your stomach and esophagus with a small camera (endoscopy). A test thatmeasures the acidity level in your esophagus. A test thatmeasures how much pressure is on your esophagus. A barium swallow or modified barium swallow test to show the  shape, size, and functioning of your esophagus. How is this treated? The goal of treatment is to help relieve your symptoms and to prevent complications. Treatment for this condition may vary depending on how severe your symptoms are. Your health care provider may recommend: Changes to your diet. Medicine. Surgery. Follow these instructions at home: Eating and drinking  Follow a diet as recommended by your health care provider. This may involve avoiding foods and  drinks such as: Coffee and tea (with or without caffeine). Drinks that containalcohol. Energy drinks and sports drinks. Carbonated drinks or sodas. Chocolate and cocoa. Peppermint and mint flavorings. Garlic and onions. Horseradish. Spicy and acidic foods, including peppers, chili powder, curry powder, vinegar, hot sauces, and barbecue sauce. Citrus fruit juices and citrus fruits, such as oranges, lemons, and limes. Tomato-based foods, such as red sauce, chili, salsa, and pizza with red sauce. Fried and fatty foods, such as donuts, french fries, potato chips, and high-fat dressings. High-fat meats, such as hot dogs and fatty cuts of red and white meats, such as rib eye steak, sausage, ham, and bacon. High-fat dairy items, such as whole milk, butter, and cream cheese. Eat small, frequent meals instead of large meals. Avoid drinking large amounts of liquid with your meals. Avoid eating meals during the 2-3 hours before bedtime. Avoid lying down right after you eat. Do not exercise right after you eat. Lifestyle  Do not use any products that contain nicotine or tobacco, such as cigarettes, e-cigarettes, and chewing tobacco. If you need help quitting, ask your health care provider. Try to reduce your stress by using methods such as yoga or meditation. If you need help reducing stress, ask your health care provider. If you are overweight, reduce your weight to an amount that is healthy for you. Ask your health care provider for guidance about a safe weight loss goal. General instructions Pay attention to any changes in your symptoms. Take over-the-counter and prescription medicines only as told by your health care provider. Do not take aspirin, ibuprofen, or other NSAIDs unless your health care provider told you to do so. Wear loose-fitting clothing. Do not wear anything tight around your waist that causes pressure on your abdomen. Raise (elevate) the head of your bed about 6 inches (15  cm). Avoid bending over if this makes your symptoms worse. Keep all follow-up visits as told by your health care provider. This is important. Contact a health care provider if: You have: New symptoms. Unexplained weight loss. Difficulty swallowing or it hurts to swallow. Wheezing or a persistent cough. A hoarse voice. Your symptoms do not improve with treatment. Get help right away if you: Have pain in your arms, neck, jaw, teeth, or back. Feel sweaty, dizzy, or light-headed. Have chest pain or shortness of breath. Vomit and your vomit looks like blood or coffee grounds. Faint. Have stool that is bloody or black. Cannot swallow, drink, or eat. Summary Gastroesophageal reflux happens when acid from the stomach flows up into the esophagus. GERD is a disease in which the reflux happens often, causes frequent or severe symptoms, or causes problems such as damage to the esophagus. Treatment for this condition may vary depending on how severe your symptoms are. Your health care provider may recommend diet and lifestyle changes, medicine, or surgery. Contact a health care provider if you have new or worsening symptoms. Take over-the-counter and prescription medicines only as told by your health care provider. Do not take aspirin, ibuprofen, or other NSAIDs unless your health care  provider told you to do so. Keep all follow-up visits as told by your health care provider. This is important. This information is not intended to replace advice given to you by your health care provider. Make sure you discuss any questions you have with your health care provider. Document Released: 08/23/2005 Document Revised: 05/22/2018 Document Reviewed: 05/22/2018 Elsevier Patient Education  [place].   Constipation, Adult Constipation is when a person:  Poops (has a bowel movement) fewer times in a week than normal.  Has a hard time pooping.  Has poop that is dry, hard, or bigger than  normal. Follow these instructions at home: Eating and drinking   Eat foods that have a lot of fiber, such as: ? Fresh fruits and vegetables. ? Whole grains. ? Beans.  Eat less of foods that are high in fat, low in fiber, or overly processed, such as: ? [place] fries. ? Hamburgers. ? Cookies. ? Candy. ? Soda.  Drink enough fluid to keep your pee (urine) clear or pale yellow. General instructions  Exercise regularly or as told by your doctor.  Go to the restroom when you feel like you need to poop. Do not hold it in.  Take over-the-counter and prescription medicines only as told by your doctor. These include any fiber supplements.  Do pelvic floor retraining exercises, such as: ? Doing deep breathing while relaxing your lower belly (abdomen). ? Relaxing your pelvic floor while pooping.  Watch your condition for any changes.  Keep all follow-up visits as told by your doctor. This is important. Contact a doctor if:  You have pain that gets worse.  You have a fever.  You have not pooped for 4 days.  You throw up (vomit).  You are not hungry.  You lose weight.  You are bleeding from the anus.  You have thin, pencil-like poop (stool). Get help right away if:  You have a fever, and your symptoms suddenly get worse.  You leak poop or have blood in your poop.  Your belly feels hard or bigger than normal (is bloated).  You have very bad belly pain.  You feel dizzy or you faint. This information is not intended to replace advice given to you by your health care provider. Make sure you discuss any questions you have with your health care provider. Document Released: 05/01/2008 Document Revised: 10/26/2017 Document Reviewed: 05/03/2016 Elsevier Patient Education  [place].   Take FDGard 1 capsule three times a day as needed  Continue Omeprazole 40 mg twice daily  Use Fiberchoice daily   I appreciate the  opportunity to care for you  Thank You    Harl Bowie , MD

## 2019-09-18 NOTE — Telephone Encounter (Signed)
Pt just had OV today and stated that Dr. Silverio Decamp has "not given her fibre pills."

## 2019-09-18 NOTE — Telephone Encounter (Signed)
Called patient and she wanted to know the name of the Fiber Dr Silverio Decamp told her to take  Explained to her its Fiber Choice Chewable tablets  She will purchase from [place] her samples but she will purchase at store

## 2019-09-18 NOTE — Progress Notes (Signed)
Breanna Sharp    [id number]    Aug 20, 1951  Primary Care Physician:Greene, Ranell Patrick, MD  Referring Physician: Wendie Agreste, MD [address] [place],  [place] [address]   Chief complaint:  GERD, dysphagia  HPI:  31 yr F with chronic GERD here for follow-up visit.  She started having choking sensation with both solids and liquids when omeprazole dose was decreased to once a day.  Since increasing it back to twice daily she is feeling better.  No longer having symptoms of dysphagia. She has globus sensation, worse when her sinus drainage gets worse with seasonal allergies. Excessive belching with certain foods, FD guard is helping. Denies any nausea, vomiting, abdominal pain, melena or bright red blood per rectum   EGD May 26, 2019 showed normal esophagus with irregular Z line, mild gastritis biopsies negative for H. pylori, dysplasia, intestinal metaplasia or malignancy.  Normal duodenum  Colonoscopy September 12, 2017: Sessile polyps and left-sided diverticulosis.  Outpatient Encounter Medications as of 09/18/2019  Medication Sig  . cholecalciferol (VITAMIN D) 1000 UNITS tablet Take 2,000 Units by mouth daily.   [name] ezetimibe (ZETIA) 10 MG tablet Take 1 tablet (10 mg total) by mouth daily.  . fexofenadine (ALLEGRA) 180 MG tablet Take by mouth.  . fluticasone (FLONASE) 50 MCG/ACT nasal spray Place 2 sprays into both nostrils daily.  [name] lisinopril-hydrochlorothiazide (ZESTORETIC) 20-12.5 MG tablet Take 1 tablet by mouth daily.  . Omeprazole 20 MG TBEC Take 2 tablets (40 mg total) by mouth daily before breakfast. (Patient taking differently: Take 40 mg by mouth 2 (two) times daily. )  . Pitavastatin Calcium (LIVALO) 1 MG TABS Take 1 tablet (1 mg total) by mouth daily.  Vladimir Faster Glycol-Propyl Glycol (SYSTANE ULTRA OP) Apply 2 drops to eye daily.  . Tiotropium Bromide Monohydrate (SPIRIVA RESPIMAT) 2.5 MCG/ACT AERS Take 2 puffs by mouth daily.  . Travoprost, BAK  Free, (TRAVATAN) 0.004 % SOLN ophthalmic solution Place 1 drop into both eyes at bedtime.   No facility-administered encounter medications on file as of 09/18/2019.     Allergies as of 09/18/2019 - Review Complete 09/18/2019  Allergen Reaction Noted  . Statins  05/15/2012    Past Medical History:  Diagnosis Date  . Allergy   . Anxiety   . Arthritis   . Cancer ([place])   . Dyslipidemia    statin intolerance  . GERD (gastroesophageal reflux disease)   . Heart murmur   . Hyperlipidemia   . Hypertension   . Vertigo     Past Surgical History:  Procedure Laterality Date  . ABDOMINAL HYSTERECTOMY  1972   cervical cancer  . COLONOSCOPY    . WISDOM TOOTH EXTRACTION     uppers    Family History  Problem Relation Age of Onset  . Hypertension Mother        dx in her 20's  . Heart attack Mother 66  . Breast cancer Mother        unsure of age  . Heart attack Brother 66  . Diabetes Brother   . Hyperlipidemia Brother   . Hypertension Brother   . Heart disease Brother   . Colon cancer Neg Hx   . Esophageal cancer Neg Hx   . Rectal cancer Neg Hx   . Stomach cancer Neg Hx     Social History   Socioeconomic History  . Marital status: Married    Spouse name: [name]  . Number  of children: 0  . Years of education: 44  . Highest education level: Not on file  Occupational History    Employer: RETIRED    Comment: Retired  Scientific laboratory technician  . Financial resource strain: Not on file  . Food insecurity    Worry: Not on file    Inability: Not on file  . Transportation needs    Medical: Not on file    Non-medical: Not on file  Tobacco Use  . Smoking status: Former Smoker    Packs/day: 0.50    Years: 32.00    Pack years: 16.00    Types: Cigarettes    Quit date: 11/27/2002    Years since quitting: 16.8  . Smokeless tobacco: Never Used  Substance and Sexual Activity  . Alcohol use: Yes    Alcohol/week: 3.0 standard drinks    Types: 3 Standard drinks or equivalent per week     Comment: social - 2 or 3 times a month 1-2 glasses of wine  . Drug use: No  . Sexual activity: Yes    Partners: Male  Lifestyle  . Physical activity    Days per week: Not on file    Minutes per session: Not on file  . Stress: Not on file  Relationships  . Social Herbalist on phone: Not on file    Gets together: Not on file    Attends religious service: Not on file    Active member of club or organization: Not on file    Attends meetings of clubs or organizations: Not on file    Relationship status: Not on file  . Intimate partner violence    Fear of current or ex partner: Not on file    Emotionally abused: Not on file    Physically abused: Not on file    Forced sexual activity: Not on file  Other Topics Concern  . Not on file  Social History Narrative   Exercise 3 to 4 times/week walking for 45 min -1 hour   Patient lives at home with her husband Legrand Como).   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   Right handed.   decaffeine Green Tea.      Review of systems: Review of Systems  Constitutional: Negative for fever and chills.  HENT: Positive for sinus problem Eyes: Negative for blurred vision.  Respiratory: Negative for cough, shortness of breath and wheezing.   Cardiovascular: Negative for chest pain and palpitations.  Gastrointestinal: as per HPI Genitourinary: Negative for dysuria, urgency, frequency and hematuria.  Musculoskeletal: Negative for myalgias, back pain and positive for joint pain.  Skin: Negative for itching and rash.  Neurological: Negative for dizziness, tremors, focal weakness, seizures and loss of consciousness.  Endo/Heme/Allergies: Positive for seasonal allergies.  Psychiatric/Behavioral: Negative for depression, suicidal ideas and hallucinations.  All other systems reviewed and are negative.   Physical Exam: Vitals:   09/18/19 1003  BP:  140/72  Pulse: 71  Temp: (!) 97.1 F (36.2 C)   Body mass index is 23.12 kg/m. Gen:      No acute distress HEENT:  EOMI, sclera anicteric Neck:     No masses; no thyromegaly Lungs:    Clear to auscultation bilaterally; normal respiratory effort CV:         Regular rate and rhythm; no murmurs Abd:      + bowel sounds; soft, non-tender; no palpable  masses, no distension Ext:    No edema; adequate peripheral perfusion Skin:      Warm and dry; no rash Neuro: alert and oriented x 3 Psych: normal mood and affect  Data Reviewed:  Reviewed labs, radiology imaging, old records and pertinent past GI work up   Assessment and Plan/Recommendations:  68 year old female with chronic GERD here for follow-up visit  Continue omeprazole 40 mg once daily Discussed antireflux measures and lifestyle modifications in detail  Use FD guard 1 capsule 3 times daily as needed for dyspepsia  Intermittent constipation: Use Fiberchoice tablets daily Increase water intake 8 to 10 cups daily  History of adenomatous colon polyps: Due for surveillance colonoscopy October 2025  Return in 3-6 months or sooner if needed  25 minutes was spent face-to-face with the patient. Greater than 50% of the time used for counseling as well as treatment plan and follow-up. She had multiple questions which were answered to her satisfaction  K. Denzil Magnuson , MD    CC: Wendie Agreste, MD

## 2019-09-22 ENCOUNTER — Other Ambulatory Visit: Payer: Self-pay

## 2019-09-22 ENCOUNTER — Ambulatory Visit (INDEPENDENT_AMBULATORY_CARE_PROVIDER_SITE_OTHER): Payer: Medicare Other | Admitting: Family Medicine

## 2019-09-22 DIAGNOSIS — I10 Essential (primary) hypertension: Secondary | ICD-10-CM | POA: Diagnosis not present

## 2019-09-23 ENCOUNTER — Encounter: Payer: Self-pay | Admitting: Gastroenterology

## 2019-09-23 LAB — BASIC METABOLIC PANEL
BUN/Creatinine Ratio: 15 (ref 12–28)
BUN: 13 mg/dL (ref 8–27)
CO2: 24 mmol/L (ref 20–29)
Calcium: 10.4 mg/dL — ABNORMAL HIGH (ref 8.7–10.3)
Chloride: 103 mmol/L (ref 96–106)
Creatinine, Ser: 0.85 mg/dL (ref 0.57–1.00)
GFR calc Af Amer: 81 mL/min/{1.73_m2} (ref >59)
GFR calc non Af Amer: 70 mL/min/{1.73_m2} (ref >59)
Glucose: 102 mg/dL — ABNORMAL HIGH (ref 65–99)
Potassium: 4.5 mmol/L (ref 3.5–5.2)
Sodium: 142 mmol/L (ref 134–144)

## 2019-09-25 ENCOUNTER — Inpatient Hospital Stay (HOSPITAL_COMMUNITY): Admission: RE | Admit: 2019-09-25 | Payer: Medicare Other | Source: Ambulatory Visit

## 2019-09-25 ENCOUNTER — Telehealth: Payer: Self-pay | Admitting: Gastroenterology

## 2019-09-25 NOTE — Telephone Encounter (Signed)
Should she go tomorrow? Do I need to move the appointment?

## 2019-09-25 NOTE — Telephone Encounter (Signed)
Confirmed with the patient that she has been contacted. She will go for her screening COVID test tomorrow. Procedure date is unchanged.

## 2019-09-25 NOTE — Telephone Encounter (Signed)
Yes.  They were calling the patients to move them to tomorrow.

## 2019-09-25 NOTE — Telephone Encounter (Signed)
Pt called to advise that [facility] is closed for the day, she had her Covid test scheduled for today, she wants to know if she could still have her procedure on 11/2. Pls call her.

## 2019-09-26 ENCOUNTER — Other Ambulatory Visit (HOSPITAL_COMMUNITY)
Admission: RE | Admit: 2019-09-26 | Discharge: 2019-09-26 | Disposition: A | Discharge status: Home / Self Care | Payer: Medicare Other | Source: Ambulatory Visit | Attending: Gastroenterology | Admitting: Gastroenterology

## 2019-09-26 DIAGNOSIS — Z20828 Contact with and (suspected) exposure to other viral communicable diseases: Secondary | ICD-10-CM | POA: Insufficient documentation

## 2019-09-26 DIAGNOSIS — Z01812 Encounter for preprocedural laboratory examination: Secondary | ICD-10-CM | POA: Insufficient documentation

## 2019-09-27 LAB — NOVEL CORONAVIRUS, NAA (HOSP ORDER, SEND-OUT TO REF LAB; TAT 18-24 HRS): SARS-CoV-2, NAA: NOT DETECTED

## 2019-09-29 ENCOUNTER — Encounter (HOSPITAL_COMMUNITY): Payer: Self-pay

## 2019-09-29 ENCOUNTER — Encounter (HOSPITAL_COMMUNITY)
Admission: RE | Disposition: A | Discharge status: Home / Self Care | Payer: Self-pay | Source: Home / Self Care | Attending: Gastroenterology

## 2019-09-29 ENCOUNTER — Ambulatory Visit (HOSPITAL_COMMUNITY)
Admission: RE | Admit: 2019-09-29 | Discharge: 2019-09-29 | Disposition: A | Discharge status: Home / Self Care | Payer: Medicare Other | Attending: Gastroenterology | Admitting: Gastroenterology

## 2019-09-29 DIAGNOSIS — K219 Gastro-esophageal reflux disease without esophagitis: Secondary | ICD-10-CM | POA: Insufficient documentation

## 2019-09-29 DIAGNOSIS — R1319 Other dysphagia: Secondary | ICD-10-CM

## 2019-09-29 DIAGNOSIS — R0989 Other specified symptoms and signs involving the circulatory and respiratory systems: Secondary | ICD-10-CM | POA: Diagnosis not present

## 2019-09-29 DIAGNOSIS — R1310 Dysphagia, unspecified: Secondary | ICD-10-CM | POA: Diagnosis not present

## 2019-09-29 DIAGNOSIS — R198 Other specified symptoms and signs involving the digestive system and abdomen: Secondary | ICD-10-CM

## 2019-09-29 HISTORY — PX: 24 HOUR PH STUDY: SHX5419

## 2019-09-29 HISTORY — PX: ESOPHAGEAL MANOMETRY: SHX5429

## 2019-09-29 SURGERY — MANOMETRY, ESOPHAGUS

## 2019-09-29 MED ORDER — LIDOCAINE VISCOUS HCL 2 % MT SOLN
OROMUCOSAL | Status: AC
  Filled 2019-09-29: qty 15

## 2019-09-29 SURGICAL SUPPLY — 2 items
FACESHIELD LNG OPTICON STERILE (SAFETY) IMPLANT
GLOVE BIO SURGEON STRL SZ8 (GLOVE) ×4 IMPLANT

## 2019-09-29 NOTE — Progress Notes (Signed)
esopahgeal manometry performed per protocol without complication, patient tolerated well.  M[place]enh[place] study performed per protocol without complication, patient tolerated well.  Ph catheter placed at 39 cm.  Patient education given, patient verbalize understanding.  Patient to return 09/30/19 at 1200 for catheter removal.

## 2019-10-01 ENCOUNTER — Encounter (HOSPITAL_COMMUNITY): Payer: Self-pay | Admitting: Gastroenterology

## 2019-10-02 DIAGNOSIS — R87615 Unsatisfactory cytologic smear of cervix: Secondary | ICD-10-CM | POA: Diagnosis not present

## 2019-10-03 DIAGNOSIS — R87619 Unspecified abnormal cytological findings in specimens from cervix uteri: Secondary | ICD-10-CM | POA: Diagnosis not present

## 2019-10-07 DIAGNOSIS — R1310 Dysphagia, unspecified: Secondary | ICD-10-CM

## 2019-10-07 DIAGNOSIS — R1319 Other dysphagia: Secondary | ICD-10-CM

## 2019-10-07 DIAGNOSIS — R0989 Other specified symptoms and signs involving the circulatory and respiratory systems: Secondary | ICD-10-CM

## 2019-10-07 DIAGNOSIS — R198 Other specified symptoms and signs involving the digestive system and abdomen: Secondary | ICD-10-CM

## 2019-10-07 IMAGING — MG DIGITAL SCREENING BILATERAL MAMMOGRAM WITH TOMO AND CAD
8 series · 8 of 24 positions shown · non-contrast
Comparison: Previous exam(s).

CLINICAL DATA: Screening.

EXAM:
DIGITAL SCREENING BILATERAL MAMMOGRAM WITH TOMO AND CAD

[R CC synth-2D]
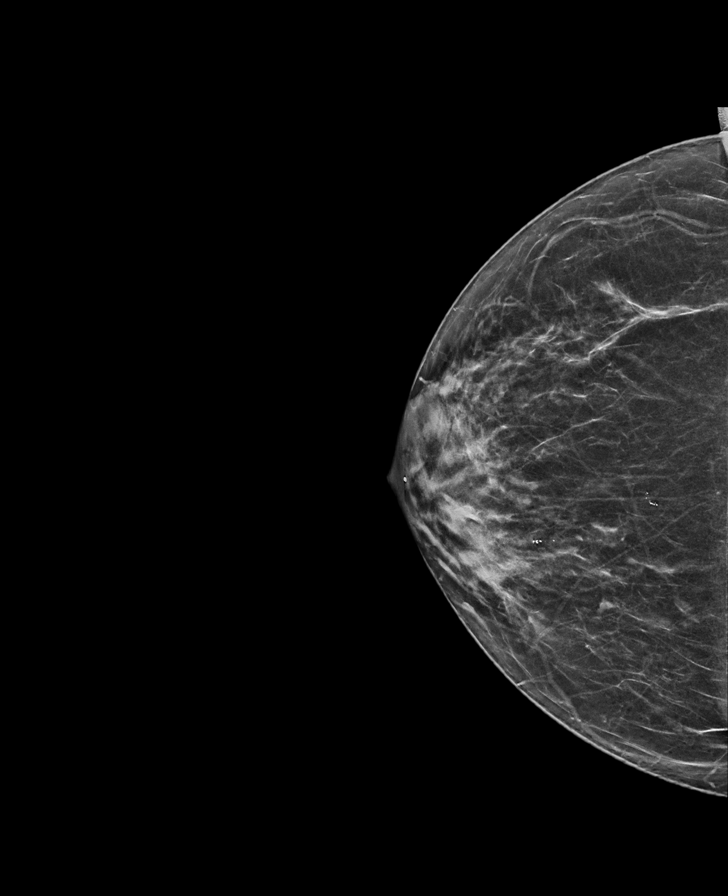

[L MLO synth-2D]
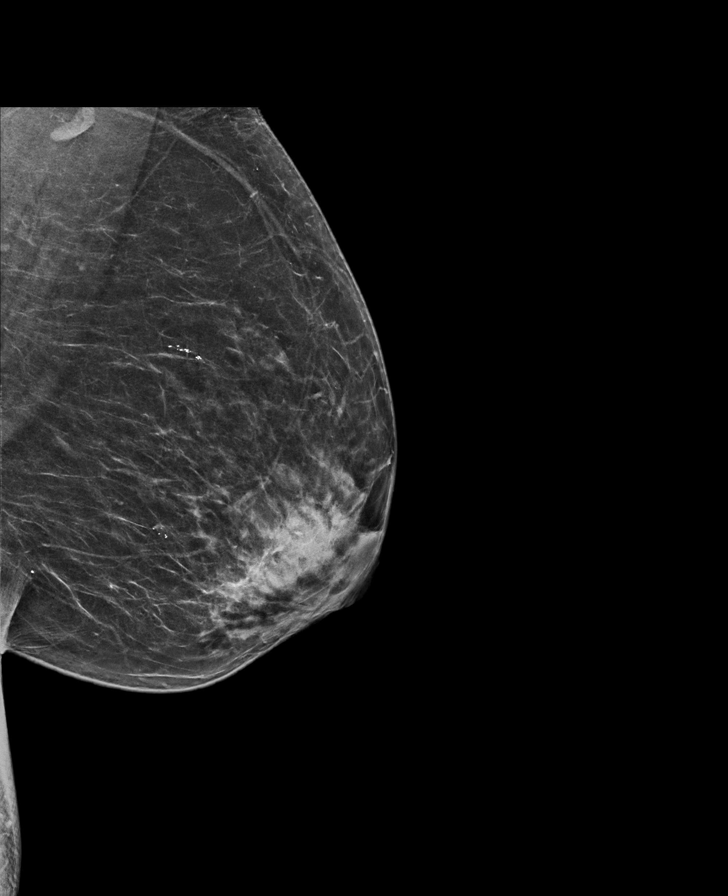

[L CC synth-2D]
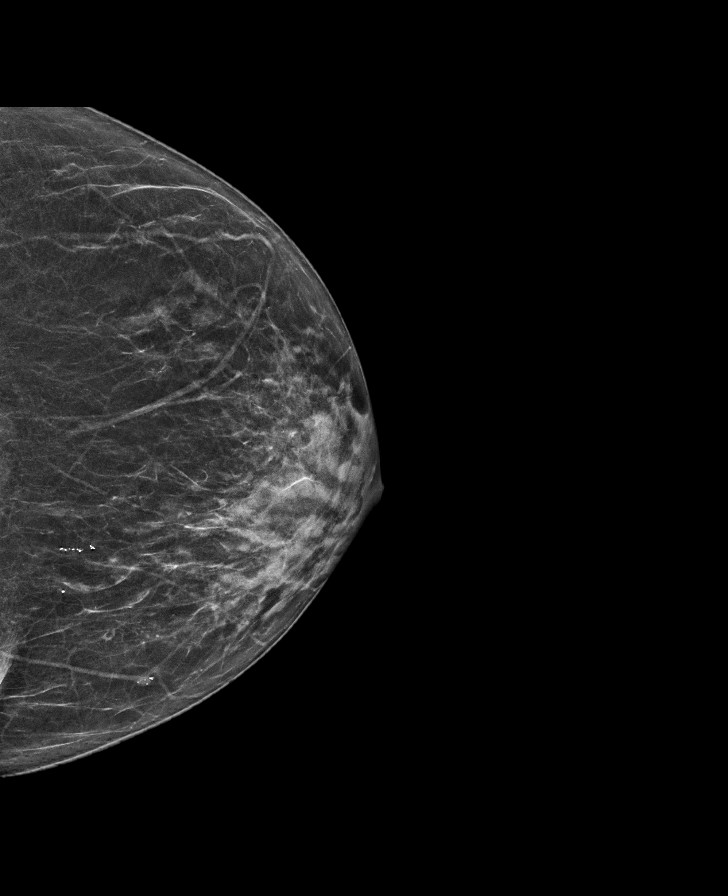

[R MLO synth-2D]
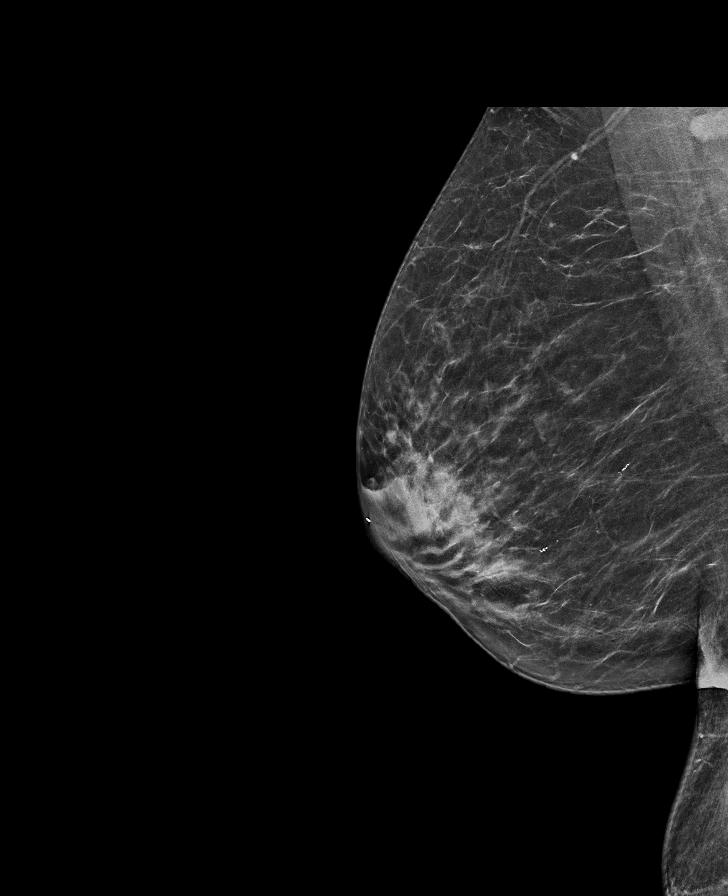

[R MLO tomo · tomo slice 34/67.0]
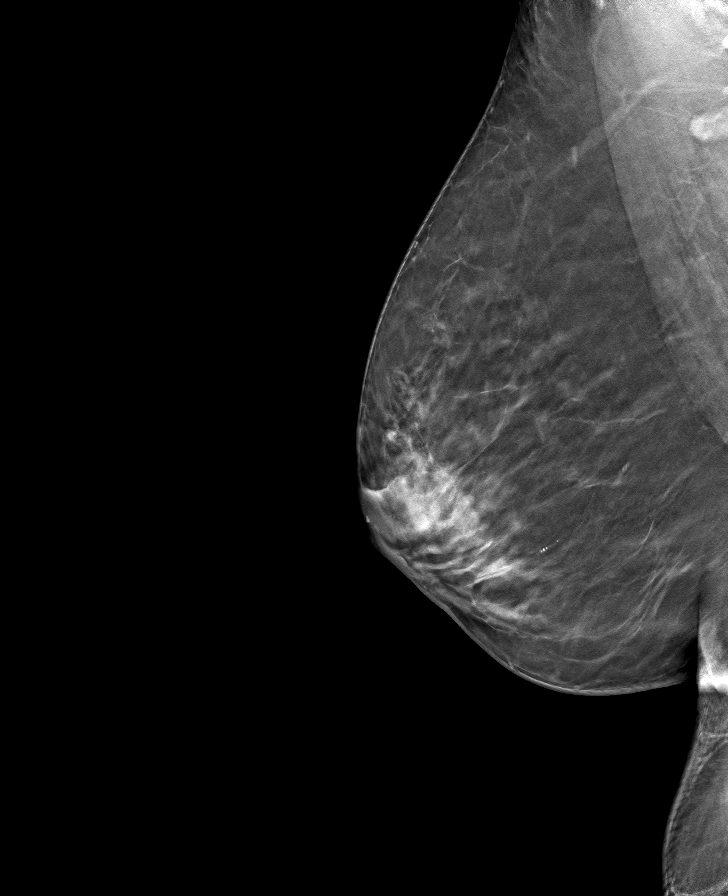

[R CC tomo · tomo slice 30/59.0]
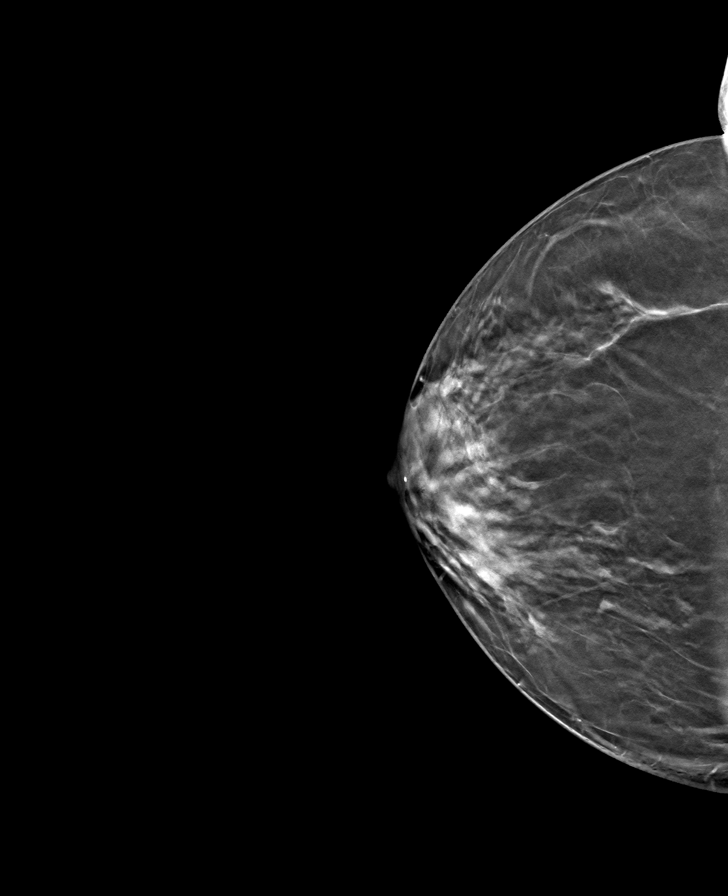

[L MLO tomo · tomo slice 35/68.0]
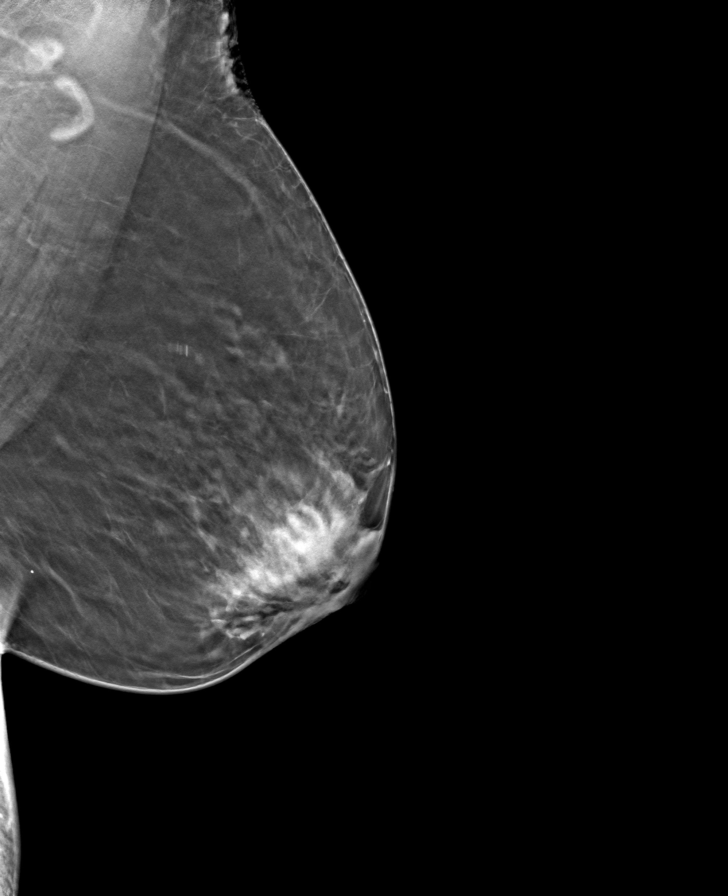

[L CC tomo · tomo slice 29/57.0]
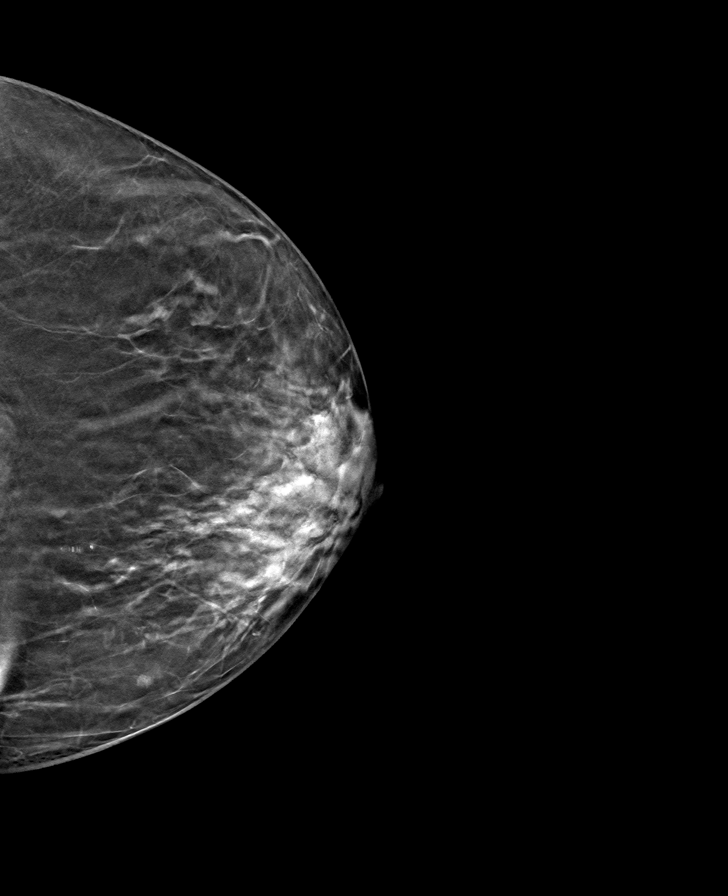

[8 of 24 positions shown; findings below may reference images not displayed]

ACR Breast Density Category b: There are scattered areas of
fibroglandular density.
FINDINGS: There are no findings suspicious for malignancy. Images were
processed with CAD.
IMPRESSION: No mammographic evidence of malignancy. A result letter of this
screening mammogram will be mailed directly to the patient.

RECOMMENDATION:
Screening mammogram in one year. (Code:[id number])

BI-RADS CATEGORY  1: Negative.

## 2019-10-07 NOTE — Progress Notes (Signed)
1400- pt returned to unit about an hour after having PH probe placed and stated that she could not handle having the Orange probe in her nose and wanted the probe out. I told the patient the study would not be good if she had the probe taken out and asked her if she was sure. Pt stated that she was sure she wanted the probe taken out. Probe removed without difficutly and patient sent home. Dr. Silverio Decamp made aware.

## 2019-10-08 ENCOUNTER — Other Ambulatory Visit: Payer: Self-pay

## 2019-10-08 ENCOUNTER — Ambulatory Visit (INDEPENDENT_AMBULATORY_CARE_PROVIDER_SITE_OTHER): Payer: Medicare Other | Admitting: Gastroenterology

## 2019-10-08 ENCOUNTER — Encounter: Payer: Self-pay | Admitting: Gastroenterology

## 2019-10-08 VITALS — BP 110/70 | HR 88 | Temp 97.9°F | Ht 63.0 in | Wt 133.2 lb

## 2019-10-08 DIAGNOSIS — K219 Gastro-esophageal reflux disease without esophagitis: Secondary | ICD-10-CM | POA: Diagnosis not present

## 2019-10-08 DIAGNOSIS — R0989 Other specified symptoms and signs involving the circulatory and respiratory systems: Secondary | ICD-10-CM

## 2019-10-08 DIAGNOSIS — R198 Other specified symptoms and signs involving the digestive system and abdomen: Secondary | ICD-10-CM

## 2019-10-08 NOTE — Patient Instructions (Signed)
It has been recommended to you by your physician that you have a(n) Endoscopy with dilation and 48 hour [place] at [facility] completed. Per your request, we did not schedule the procedure(s) today. Please contact our office at [phone] should you decide to have the procedure completed. You will be scheduled for a pre-visit and procedure at that time. Call back to speak with Shirlean Mylar to get scheduled the last week of December if you decide  Taper Off Omeprazole in the next two weeks  OK to use Gaviscon and FD Donald Prose as needed  I appreciate the  opportunity to care for you  Thank You   Harl Bowie , MD

## 2019-10-08 NOTE — Progress Notes (Signed)
Breanna Sharp    [id number]    [birth year]  Primary Care Physician:Greene, Ranell Patrick, MD  Referring Physician: Wendie Agreste, MD [address] [place],  [place] [address]   Chief complaint:  Globus sensation  HPI: 68 year old female with chronic GERD and globus sensation here for follow-up She is taking Omeprazole 80mg  BID, gaviscon, FD Gard with no improvement She has constant urge to clear her throat and feels something is hung up back of her throat.  Denies any food getting stuck or difficulty swallowing.  No vomiting, abdominal pain, melena or blood per rectum.  Esophageal manometry: 09/29/19: Showed findings of EGJ outflow obstruction  She didn't tolerate 24 pH impedance, patient had it pulled out in less than 1 hour. Per patient the tube was coiling multiple times and it was pulled and replaced multiple time irritate her nose, she was gaging and couldn't tolerate it.  EGD 05/26/2019: showed normal esophagus with irregular Z line, mild gastritis biopsies negative for H. pylori, dysplasia, intestinal metaplasia or malignancy. Normal duodenum  Colonoscopy September 12, 2017: Sessile polyps and left-sided diverticulosis.  Family history negative for GI malignancy.  Outpatient Encounter Medications as of 10/08/2019  Medication Sig  . Alum Hydroxide-Mag Carbonate (GAVISCON PO) Take 2 tablets by mouth 2 (two) times daily.  . AMBULATORY NON FORMULARY MEDICATION Take 1 capsule by mouth 3 (three) times daily. Medication Name: [place]  . cholecalciferol (VITAMIN D) 1000 UNITS tablet Take 2,000 Units by mouth daily.   [name] ezetimibe (ZETIA) 10 MG tablet Take 1 tablet (10 mg total) by mouth daily.  . fexofenadine (ALLEGRA) 180 MG tablet Take by mouth.  . fluticasone (FLONASE) 50 MCG/ACT nasal spray Place 2 sprays into both nostrils daily.  . Inulin (FIBER CHOICE PO) Take 2 tablets by mouth 2 (two) times daily as needed.  [name] lisinopril-hydrochlorothiazide (ZESTORETIC)  20-12.5 MG tablet Take 1 tablet by mouth daily.  . Omeprazole 20 MG TBEC Take 2 tablets (40 mg total) by mouth daily before breakfast. (Patient taking differently: Take 40 mg by mouth daily at 6 (six) AM. 2 tablets (80mg ) twice a day)  . Pitavastatin Calcium (LIVALO) 1 MG TABS Take 1 tablet (1 mg total) by mouth daily.  Vladimir Faster Glycol-Propyl Glycol (SYSTANE ULTRA OP) Apply 2 drops to eye daily.  . Tiotropium Bromide Monohydrate (SPIRIVA RESPIMAT) 2.5 MCG/ACT AERS Take 2 puffs by mouth daily.  . Travoprost, BAK Free, (TRAVATAN) 0.004 % SOLN ophthalmic solution Place 1 drop into both eyes at bedtime.   No facility-administered encounter medications on file as of 10/08/2019.     Allergies as of 10/08/2019 - Review Complete 09/29/2019  Allergen Reaction Noted  . Statins  05/15/2012    Past Medical History:  Diagnosis Date  . Allergy   . Anxiety   . Arthritis   . Cancer ([place])   . Dyslipidemia    statin intolerance  . GERD (gastroesophageal reflux disease)   . Heart murmur   . Hyperlipidemia   . Hypertension   . Vertigo     Past Surgical History:  Procedure Laterality Date  . [place] STUDY N/A 09/29/2019   Procedure: [place] STUDY;  Surgeon: Mauri Pole, MD;  Location: [facility];  Service: Endoscopy;  Laterality: N/A;  . ABDOMINAL HYSTERECTOMY  1972   cervical cancer  . COLONOSCOPY    . ESOPHAGEAL MANOMETRY N/A 09/29/2019   Procedure: ESOPHAGEAL MANOMETRY (EM);  Surgeon: Harl Bowie  V, MD;  Location: [facility];  Service: Endoscopy;  Laterality: N/A;  . WISDOM TOOTH EXTRACTION     uppers    Family History  Problem Relation Age of Onset  . Hypertension Mother        dx in her 63's  . Heart attack Mother 46  . Breast cancer Mother        unsure of age  . Heart attack Brother 34  . Diabetes Brother   . Hyperlipidemia Brother   . Hypertension Brother   . Heart disease Brother   . Colon cancer Neg Hx   . Esophageal cancer Neg Hx   . Rectal  cancer Neg Hx   . Stomach cancer Neg Hx     Social History   Socioeconomic History  . Marital status: Married    Spouse name: [name]  . Number of children: 0  . Years of education: 71  . Highest education level: Not on file  Occupational History    Employer: RETIRED    Comment: Retired  Scientific laboratory technician  . Financial resource strain: Not on file  . Food insecurity    Worry: Not on file    Inability: Not on file  . Transportation needs    Medical: Not on file    Non-medical: Not on file  Tobacco Use  . Smoking status: Former Smoker    Packs/day: 0.50    Years: 32.00    Pack years: 16.00    Types: Cigarettes    Quit date: 11/27/2002    Years since quitting: 16.8  . Smokeless tobacco: Never Used  Substance and Sexual Activity  . Alcohol use: Yes    Alcohol/week: 3.0 standard drinks    Types: 3 Standard drinks or equivalent per week    Comment: social - 2 or 3 times a month 1-2 glasses of wine  . Drug use: No  . Sexual activity: Yes    Partners: Male  Lifestyle  . Physical activity    Days per week: Not on file    Minutes per session: Not on file  . Stress: Not on file  Relationships  . Social Herbalist on phone: Not on file    Gets together: Not on file    Attends religious service: Not on file    Active member of club or organization: Not on file    Attends meetings of clubs or organizations: Not on file    Relationship status: Not on file  . Intimate partner violence    Fear of current or ex partner: Not on file    Emotionally abused: Not on file    Physically abused: Not on file    Forced sexual activity: Not on file  Other Topics Concern  . Not on file  Social History Narrative   Exercise 3 to 4 times/week walking for 45 min -1 hour   Patient lives at home with her husband Legrand Como).   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   Right handed.   decaffeine  Green Tea.      Review of systems: Review of Systems  Constitutional: Negative for fever and chills.  HENT: Positive for sinus problem  Eyes: Negative for blurred vision.  Respiratory: Negative for cough, shortness of breath and wheezing.   Cardiovascular: Negative for chest pain and palpitations.  Gastrointestinal: as per HPI Genitourinary: Negative for dysuria, urgency, frequency and  hematuria.  Musculoskeletal: Negative for myalgias, back pain and joint pain.  Skin: Negative for itching and rash.  Neurological: Negative for dizziness, tremors, focal weakness, seizures and loss of consciousness.  Endo/Heme/Allergies: Positive for seasonal allergies.  Psychiatric/Behavioral: Negative for depression, suicidal ideas and hallucinations.  All other systems reviewed and are negative.   Physical Exam: Vitals:   10/08/19 1501  BP: 110/70  Pulse: 88  Temp: 97.9 F (36.6 C)   Body mass index is 23.6 kg/m. Gen:      No acute distress HEENT:  EOMI, sclera anicteric Neck:     No masses; no thyromegaly Lungs:    Clear to auscultation bilaterally; normal respiratory effort CV:         Regular rate and rhythm; no murmurs Abd:      + bowel sounds; soft, non-tender; no palpable masses, no distension Ext:    No edema; adequate peripheral perfusion Skin:      Warm and dry; no rash Neuro: alert and oriented x 3 Psych: normal mood and affect  Data Reviewed:  Reviewed labs, radiology imaging, old records and pertinent past GI work up   Assessment and Plan/Recommendations:  68 year old female with chronic GERD with complaints of persistent globus sensation despite high-dose PPI  Esophageal manometry suggestive of a GJ outflow obstruction though no obvious stricture on EGD.  She did not tolerate 24-hour pH catheter  We will plan for repeat EGD with esophageal dilation empirically and placement of 48-hour pH bravo off PPI  Advised patient to taper off PPI over the next 2 to 4 weeks   Okay to use Gaviscon and FD guard as needed up to 3 times daily  Continue antireflux measures  Return after EGD and Bravo study to discuss further recommendations and management The risks and benefits as well as alternatives of endoscopic procedure(s) have been discussed and reviewed. All questions answered. The patient agrees to proceed.   Damaris Hippo , MD    CC: Wendie Agreste, MD

## 2019-10-13 ENCOUNTER — Telehealth: Payer: Self-pay | Admitting: Gastroenterology

## 2019-10-13 DIAGNOSIS — K219 Gastro-esophageal reflux disease without esophagitis: Secondary | ICD-10-CM

## 2019-10-13 DIAGNOSIS — R12 Heartburn: Secondary | ICD-10-CM

## 2019-10-13 DIAGNOSIS — R1013 Epigastric pain: Secondary | ICD-10-CM

## 2019-10-13 DIAGNOSIS — Z1159 Encounter for screening for other viral diseases: Secondary | ICD-10-CM

## 2019-10-13 DIAGNOSIS — R198 Other specified symptoms and signs involving the digestive system and abdomen: Secondary | ICD-10-CM

## 2019-10-13 DIAGNOSIS — R0989 Other specified symptoms and signs involving the circulatory and respiratory systems: Secondary | ICD-10-CM

## 2019-10-13 DIAGNOSIS — R140 Abdominal distension (gaseous): Secondary | ICD-10-CM

## 2019-10-13 NOTE — Telephone Encounter (Signed)
Patient is scheduled on 11/24/2019 at 8:30am [facility] for a EGD/Bravo   Covid test 11/20/2019 at 9:30am  Due to recent COVID-19 restrictions implemented by our local and state authorities and in an effort to keep both patients and staff as safe as possible, our hospital system now requires COVID-19 testing prior to any scheduled hospital procedure. Please go to our [facility] location drive thru testing site ([address], [place], [place] [address]) on 12/24 at  9:30am. There will be multiple testing areas, the first checkpoint being for pre-procedure/surgery testing. Get into the right (yellow) lane that leads to the PAT testing team. You will not be billed at the time of testing but may receive a bill later depending on your insurance. The approximate cost of the test is $100. You must agree to quarantine from the time of your testing until the procedure date on 11/24/2019 . This should include staying at home with ONLY the people you live with. Avoid take-out, grocery store shopping or leaving the house for any non-emergent reason. Failure to have your COVID-19 test done on the date and time you have been scheduled will result in cancellation of procedure. Please call our office at [phone] if you have any questions.   Patient aware of date and times

## 2019-10-21 ENCOUNTER — Encounter: Payer: Self-pay | Admitting: Gastroenterology

## 2019-11-13 ENCOUNTER — Ambulatory Visit (INDEPENDENT_AMBULATORY_CARE_PROVIDER_SITE_OTHER): Payer: Medicare Other | Admitting: Family Medicine

## 2019-11-13 VITALS — BP 110/70 | Ht 63.5 in | Wt 133.0 lb

## 2019-11-13 DIAGNOSIS — Z0000 Encounter for general adult medical examination without abnormal findings: Secondary | ICD-10-CM | POA: Diagnosis not present

## 2019-11-13 NOTE — Progress Notes (Signed)
Presents today for [employer] Visit   Date of last exam: 09/22/2019  Interpreter used for this visit? No  I connected with  Breanna Sharp on 11/13/19 by a  enabled telephone and verified that I am speaking with the correct person using two identifiers.   I discussed the limitations of evaluation and management by telemedicine. The patient expressed understanding and agreed to proceed.   Patient Care Team: Wendie Agreste, MD as PCP - General (Family Medicine) Debara Pickett Nadean Corwin, MD as PCP - Cardiology (Cardiology)   Other items to address today:   Discussed immunizations Discussed Eye/Dental Follow up scheduled 01/21/2019 Dr. [name]   Other Screening: Last screening for diabetes: 07/23/2019 Last lipid screening: 07/23/2019  ADVANCE DIRECTIVES: Discussed: yes On File: no Materials Provided: yes  Immunization status:  Immunization History  Administered Date(s) Administered  . Fluad Quad(high Dose 65+) 07/23/2019  . Influenza Split 08/14/2012  . Influenza,inj,Quad PF,6+ Mos 08/20/2013, 08/26/2014, 09/01/2015, 08/31/2016, 09/04/2017  . Influenza-Unspecified 08/08/2018  . Pneumococcal Conjugate-13 09/01/2015  . Pneumococcal Polysaccharide-23 09/07/2016  . Tdap 08/20/2013  . Zoster 09/28/2011     There are no preventive care reminders to display for this patient.   Functional Status Survey: Is the patient deaf or have difficulty hearing?: No Does the patient have difficulty seeing, even when wearing glasses/contacts?: No Does the patient have difficulty concentrating, remembering, or making decisions?: No Does the patient have difficulty walking or climbing stairs?: No Does the patient have difficulty dressing or bathing?: No Does the patient have difficulty doing errands alone such as visiting a doctor's office or shopping?: No   6CIT Screen 11/13/2019 09/10/2018  What Year? 0 points 0 points  What month? 0 points 0 points  What time? 0  points 0 points  Count back from 20 0 points 0 points  Months in reverse 0 points 0 points  Repeat phrase 0 points 2 points  Total Score 0 2        Clinical Support from 11/13/2019 in [facility] [facility] [place]  AUDIT-C Score  2       Home Environment:   Lives in a two story home No scattered rugs No grab bars Adequate lighting/no clutter No trouble climbing stairs  Timed get up N/A   Patient Active Problem List   Diagnosis Date Noted  . Esophageal dysphagia   . Globus sensation   . H/O abdominal hysterectomy 06/04/2019  . Vaginal discharge 06/04/2019  . History of cervical cancer 06/04/2019  . Vaginal atrophy 06/04/2019  . Labial lesion 06/04/2019  . Epidemic vertigo 01/30/2018  . COPD, group A, by GOLD 2017 classification ([place]) 06/06/2017  . Dyslipidemia 05/11/2016  . Shortness of breath 05/11/2016  . Essential hypertension 05/11/2016  . Osteopenia 10/12/2015  . DOE (dyspnea on exertion) 05/02/2013  . Chronic sinusitis 05/02/2013  . Vertigo 08/14/2012  . Benign essential HTN 08/14/2012  . BMI 28.0-28.9,adult 08/14/2012  . COLONIC POLYPS 01/24/2008  . ACUT GASTR ULCER W/O MENTION HEMORR PERF/OBST 01/24/2008     Past Medical History:  Diagnosis Date  . Allergy   . Anxiety   . Arthritis   . Cancer ([place])   . Dyslipidemia    statin intolerance  . GERD (gastroesophageal reflux disease)   . Heart murmur   . Hyperlipidemia   . Hypertension   . Vertigo      Past Surgical History:  Procedure Laterality Date  . [place] STUDY N/A 09/29/2019   Procedure: 24  HOUR PH STUDY;  Surgeon: Mauri Pole, MD;  Location: [facility];  Service: Endoscopy;  Laterality: N/A;  . ABDOMINAL HYSTERECTOMY  1972   cervical cancer  . COLONOSCOPY    . ESOPHAGEAL MANOMETRY N/A 09/29/2019   Procedure: ESOPHAGEAL MANOMETRY (EM);  Surgeon: Mauri Pole, MD;  Location: [facility];  Service: Endoscopy;  Laterality: N/A;  . WISDOM TOOTH EXTRACTION     uppers      Family History  Problem Relation Age of Onset  . Hypertension Mother        dx in her 66's  . Heart attack Mother 37  . Breast cancer Mother        unsure of age  . Heart attack Brother 72  . Diabetes Brother   . Hyperlipidemia Brother   . Hypertension Brother   . Heart disease Brother   . Colon cancer Neg Hx   . Esophageal cancer Neg Hx   . Rectal cancer Neg Hx   . Stomach cancer Neg Hx      Social History   Socioeconomic History  . Marital status: Married    Spouse name: [name]  . Number of children: 0  . Years of education: 24  . Highest education level: Not on file  Occupational History    Employer: RETIRED    Comment: Retired  Tobacco Use  . Smoking status: Former Smoker    Packs/day: 0.50    Years: 32.00    Pack years: 16.00    Types: Cigarettes    Quit date: 11/27/2002    Years since quitting: 16.9  . Smokeless tobacco: Never Used  Substance and Sexual Activity  . Alcohol use: Yes    Alcohol/week: 3.0 standard drinks    Types: 3 Standard drinks or equivalent per week    Comment: social - 2 or 3 times a month 1-2 glasses of wine  . Drug use: No  . Sexual activity: Yes    Partners: Male  Other Topics Concern  . Not on file  Social History Narrative   Exercise 3 to 4 times/week walking for 45 min -1 hour   Patient lives at home with her husband Legrand [place]).   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   Right handed.   decaffeine Green Tea.   Social Determinants of Health   Financial Resource Strain:   . Difficulty of Paying Living Expenses: Not on file  Food Insecurity:   . Worried About Charity fundraiser in the Last Year: Not on file  . Ran Out of Food in the Last Year: Not on file  Transportation Needs:   . Lack of Transportation (Medical): Not on file  . Lack of Transportation (Non-Medical): Not on file  Physical Activity:   . Days of Exercise per  Week: Not on file  . Minutes of Exercise per Session: Not on file  Stress:   . Feeling of Stress : Not on file  Social Connections:   . Frequency of Communication with Friends and Family: Not on file  . Frequency of Social Gatherings with Friends and Family: Not on file  . Attends Religious Services: Not on file  . Active Member of Clubs or Organizations: Not on file  . Attends Archivist Meetings: Not on file  . Marital Status: Not on file  Intimate Partner Violence:   . Fear of Current or Ex-Partner: Not on file  .  Emotionally Abused: Not on file  . Physically Abused: Not on file  . Sexually Abused: Not on file     Allergies  Allergen Reactions  . Statins     Muscle cramps     Prior to Admission medications   Medication Sig Start Date End Date Taking? Authorizing Provider  acetaminophen (TYLENOL) 650 MG CR tablet Take 1,300 mg by mouth every 8 (eight) hours as needed for pain.   Yes [provider]  Alum Hydroxide-Mag Carbonate (GAVISCON EXTRA STRENGTH) 160-105 MG CHEW Chew 2-4 tablets by mouth 2 (two) times daily as needed (heartburn).   Yes [provider]  Cholecalciferol (VITAMIN D) 50 MCG (2000 UT) CAPS Take 2,000 Units by mouth daily after lunch.   Yes [provider]  fexofenadine (ALLEGRA) 180 MG tablet Take 180 mg by mouth daily.  10/04/13  Yes [provider]  fluticasone (FLONASE) 50 MCG/ACT nasal spray Place 2 sprays into both nostrils daily. 07/23/19  Yes Wendie Agreste, MD  Inulin (FIBER CHOICE PO) Take 2 capsules by mouth daily after supper.   Yes [provider]  lisinopril-hydrochlorothiazide (ZESTORETIC) 20-12.5 MG tablet Take 1 tablet by mouth daily. 07/23/19  Yes Wendie Agreste, MD  Multiple Vitamin (MULTIVITAMIN WITH MINERALS) TABS tablet Take 1 tablet by mouth daily.   Yes [provider]  Omeprazole 20 MG TBEC Take 2 tablets (40 mg total) by mouth daily before breakfast. 08/08/19  Yes  Nandigam, Venia Minks, MD  Pitavastatin Calcium (LIVALO) 1 MG TABS Take 1 tablet (1 mg total) by mouth daily. 08/28/19  Yes Hilty, Nadean Corwin, MD  Tiotropium Bromide Monohydrate (SPIRIVA RESPIMAT) 2.5 MCG/ACT AERS Take 2 puffs by mouth daily. 07/23/19  Yes Wendie Agreste, MD  Travoprost, BAK Free, (TRAVATAN) 0.004 % SOLN ophthalmic solution Place 1 drop into both eyes at bedtime.   Yes [provider]  [place] Oil-Levomenthol (FDGARD PO) Take 1 capsule by mouth 2 (two) times daily as needed (heartburn).    [provider]  Carboxymethylcellul-Glycerin (LUBRICATING EYE DROPS OP) Place 2 drops into both eyes daily.    [provider]  ezetimibe (ZETIA) 10 MG tablet Take 1 tablet (10 mg total) by mouth daily. 08/28/19   Pixie Casino, MD     Depression screen [facility] 2/9 11/13/2019 07/23/2019 06/04/2019 04/07/2019 01/20/2019  Decreased Interest 0 0 0 0 0  Down, Depressed, Hopeless 0 0 0 0 0  PHQ - 2 Score 0 0 0 0 0     Fall Risk  11/13/2019 07/23/2019 06/04/2019 04/07/2019 01/20/2019  Falls in the past year? 0 0 0 0 0  Number falls in past yr: 0 0 - 0 0  Injury with Fall? 0 0 0 0 0  Follow up Falls evaluation completed;Education provided Falls evaluation completed - - Falls evaluation completed      PHYSICAL EXAM: BP 110/70 Comment: TAKEN PREVIOUS VISIT  Ht 5' 3.5" (1.613 m)   Wt 133 lb (60.3 kg)   BMI 23.19 kg/m    Wt Readings from Last 3 Encounters:  11/13/19 133 lb (60.3 kg)  10/08/19 133 lb 4 oz (60.4 kg)  09/18/19 132 lb 9.6 oz (60.1 kg)     Medicare annual wellness visit, subsequent   Education/Counseling provided regarding diet and exercise, prevention of chronic diseases, smoking/tobacco cessation, if applicable, and reviewed "Covered Medicare Preventive Services."

## 2019-11-13 NOTE — Patient Instructions (Addendum)
Thank you for taking time to come for your Medicare Wellness Visit. I appreciate your ongoing commitment to your health goals. Please review the following plan we discussed and let me know if I can assist you in the future.  Danyl Deems LPN  Preventive Care 65 Years and Older, Female Preventive care refers to lifestyle choices and visits with your health care provider that can promote health and wellness. This includes:  A yearly physical exam. This is also called an annual well check.  Regular dental and eye exams.  Immunizations.  Screening for certain conditions.  Healthy lifestyle choices, such as diet and exercise. What can I expect for my preventive care visit? Physical exam Your health care provider will check:  Height and weight. These may be used to calculate body mass index (BMI), which is a measurement that tells if you are at a healthy weight.  Heart rate and blood pressure.  Your skin for abnormal spots. Counseling Your health care provider may ask you questions about:  Alcohol, tobacco, and drug use.  Emotional well-being.  Home and relationship well-being.  Sexual activity.  Eating habits.  History of falls.  Memory and ability to understand (cognition).  Work and work environment.  Pregnancy and menstrual history. What immunizations do I need?  Influenza (flu) vaccine  This is recommended every year. Tetanus, diphtheria, and pertussis (Tdap) vaccine  You may need a Td booster every 10 years. Varicella (chickenpox) vaccine  You may need this vaccine if you have not already been vaccinated. Zoster (shingles) vaccine  You may need this after age 60. Pneumococcal conjugate (PCV13) vaccine  One dose is recommended after age 65. Pneumococcal polysaccharide (PPSV23) vaccine  One dose is recommended after age 65. Measles, mumps, and rubella (MMR) vaccine  You may need at least one dose of MMR if you were born in 1957 or later. You may also  need a second dose. Meningococcal conjugate (MenACWY) vaccine  You may need this if you have certain conditions. Hepatitis A vaccine  You may need this if you have certain conditions or if you travel or work in places where you may be exposed to hepatitis A. Hepatitis B vaccine  You may need this if you have certain conditions or if you travel or work in places where you may be exposed to hepatitis B. Haemophilus influenzae type b (Hib) vaccine  You may need this if you have certain conditions. You may receive vaccines as individual doses or as more than one vaccine together in one shot (combination vaccines). Talk with your health care provider about the risks and benefits of combination vaccines. What tests do I need? Blood tests  Lipid and cholesterol levels. These may be checked every 5 years, or more frequently depending on your overall health.  Hepatitis C test.  Hepatitis B test. Screening  Lung cancer screening. You may have this screening every year starting at age 55 if you have a 30-pack-year history of smoking and currently smoke or have quit within the past 15 years.  Colorectal cancer screening. All adults should have this screening starting at age 50 and continuing until age 75. Your health care provider may recommend screening at age 45 if you are at increased risk. You will have tests every 1-10 years, depending on your results and the type of screening test.  Diabetes screening. This is done by checking your blood sugar (glucose) after you have not eaten for a while (fasting). You may have this done every 1-3   years.  Mammogram. This may be done every 1-2 years. Talk with your health care provider about how often you should have regular mammograms.  BRCA-related cancer screening. This may be done if you have a family history of breast, ovarian, tubal, or peritoneal cancers. Other tests  Sexually transmitted disease (STD) testing.  Bone density scan. This is done  to screen for osteoporosis. You may have this done starting at age 6. Follow these instructions at home: Eating and drinking  Eat a diet that includes fresh fruits and vegetables, whole grains, lean protein, and low-fat dairy products. Limit your intake of foods with high amounts of sugar, saturated fats, and salt.  Take vitamin and mineral supplements as recommended by your health care provider.  Do not drink alcohol if your health care provider tells you not to drink.  If you drink alcohol: ? Limit how much you have to 0-1 drink a day. ? Be aware of how much alcohol is in your drink. In the [place] one drink equals one 12 oz bottle of beer (355 mL), one 5 oz glass of wine (148 mL), or one 1 oz glass of hard liquor (44 mL). Lifestyle  Take daily care of your teeth and gums.  Stay active. Exercise for at least 30 minutes on 5 or more days each week.  Do not use any products that contain nicotine or tobacco, such as cigarettes, e-cigarettes, and chewing tobacco. If you need help quitting, ask your health care provider.  If you are sexually active, practice safe sex. Use a condom or other form of protection in order to prevent STIs (sexually transmitted infections).  Talk with your health care provider about taking a low-dose aspirin or statin. What's next?  Go to your health care provider once a year for a well check visit.  Ask your health care provider how often you should have your eyes and teeth checked.  Stay up to date on all vaccines. This information is not intended to replace advice given to you by your health care provider. Make sure you discuss any questions you have with your health care provider. Document Released: 12/10/2015 Document Revised: 11/07/2018 Document Reviewed: 11/07/2018 Elsevier Patient Education  2020 [employer].

## 2019-11-20 ENCOUNTER — Other Ambulatory Visit (HOSPITAL_COMMUNITY)
Admission: RE | Admit: 2019-11-20 | Discharge: 2019-11-20 | Disposition: A | Discharge status: Home / Self Care | Payer: Medicare Other | Source: Ambulatory Visit | Attending: Gastroenterology | Admitting: Gastroenterology

## 2019-11-20 ENCOUNTER — Encounter (HOSPITAL_COMMUNITY): Payer: Self-pay | Admitting: Gastroenterology

## 2019-11-20 DIAGNOSIS — Z20828 Contact with and (suspected) exposure to other viral communicable diseases: Secondary | ICD-10-CM | POA: Diagnosis not present

## 2019-11-20 DIAGNOSIS — Z01812 Encounter for preprocedural laboratory examination: Secondary | ICD-10-CM | POA: Diagnosis present

## 2019-11-20 NOTE — Anesthesia Preprocedure Evaluation (Addendum)
Anesthesia Evaluation  Patient identified by MRN, date of birth, ID band Patient awake    Reviewed: Allergy & Precautions, NPO status , Patient's Chart, lab work & pertinent test results  Airway Mallampati: II  TM Distance: >3 FB Neck ROM: Full    Dental no notable dental hx. (+) Dental Advisory Given   Pulmonary shortness of breath and with exertion, COPD,  COPD inhaler, former smoker,    Pulmonary exam normal breath sounds clear to auscultation       Cardiovascular hypertension, + DOE  Normal cardiovascular exam+ Valvular Problems/Murmurs  Rhythm:Regular Rate:Normal     Neuro/Psych Anxiety  Neuromuscular disease negative neurological ROS  negative psych ROS   GI/Hepatic negative GI ROS, Neg liver ROS, PUD, GERD  Medicated and Controlled,Dysphagia   Endo/Other  negative endocrine ROSHyperlipidemia  Renal/GU negative Renal ROS     Musculoskeletal negative musculoskeletal ROS (+) Arthritis , Osteoarthritis,    Abdominal   Peds negative pediatric ROS (+)  Hematology negative hematology ROS (+)   Anesthesia Other Findings   Reproductive/Obstetrics negative OB ROS                            Anesthesia Physical Anesthesia Plan  ASA: II  Anesthesia Plan: MAC   Post-op Pain Management:    Induction: Intravenous  PONV Risk Score and Plan: 2 and Propofol infusion, Ondansetron and Treatment may vary due to age or medical condition  Airway Management Planned: Natural Airway and Nasal Cannula  Additional Equipment:   Intra-op Plan:   Post-operative Plan:   Informed Consent: I have reviewed the patients History and Physical, chart, labs and discussed the procedure including the risks, benefits and alternatives for the proposed anesthesia with the patient or authorized representative who has indicated his/her understanding and acceptance.     Dental advisory given  Plan Discussed  with: CRNA  Anesthesia Plan Comments:        Anesthesia Quick Evaluation

## 2019-11-21 LAB — NOVEL CORONAVIRUS, NAA (HOSP ORDER, SEND-OUT TO REF LAB; TAT 18-24 HRS): SARS-CoV-2, NAA: NOT DETECTED

## 2019-11-22 ENCOUNTER — Encounter (HOSPITAL_COMMUNITY): Payer: Self-pay | Admitting: Gastroenterology

## 2019-11-24 ENCOUNTER — Ambulatory Visit (HOSPITAL_COMMUNITY)
Admission: RE | Admit: 2019-11-24 | Discharge: 2019-11-24 | Disposition: A | Discharge status: Home / Self Care | Payer: Medicare Other | Attending: Gastroenterology | Admitting: Gastroenterology

## 2019-11-24 ENCOUNTER — Ambulatory Visit (HOSPITAL_COMMUNITY): Payer: Medicare Other | Admitting: Anesthesiology

## 2019-11-24 ENCOUNTER — Encounter (HOSPITAL_COMMUNITY): Payer: Self-pay | Admitting: Gastroenterology

## 2019-11-24 ENCOUNTER — Other Ambulatory Visit: Payer: Self-pay

## 2019-11-24 ENCOUNTER — Encounter (HOSPITAL_COMMUNITY)
Admission: RE | Disposition: A | Discharge status: Home / Self Care | Payer: Self-pay | Source: Home / Self Care | Attending: Gastroenterology

## 2019-11-24 DIAGNOSIS — J449 Chronic obstructive pulmonary disease, unspecified: Secondary | ICD-10-CM | POA: Diagnosis not present

## 2019-11-24 DIAGNOSIS — Z8349 Family history of other endocrine, nutritional and metabolic diseases: Secondary | ICD-10-CM | POA: Diagnosis not present

## 2019-11-24 DIAGNOSIS — Z8541 Personal history of malignant neoplasm of cervix uteri: Secondary | ICD-10-CM | POA: Insufficient documentation

## 2019-11-24 DIAGNOSIS — Z87891 Personal history of nicotine dependence: Secondary | ICD-10-CM | POA: Insufficient documentation

## 2019-11-24 DIAGNOSIS — K449 Diaphragmatic hernia without obstruction or gangrene: Secondary | ICD-10-CM | POA: Insufficient documentation

## 2019-11-24 DIAGNOSIS — M1990 Unspecified osteoarthritis, unspecified site: Secondary | ICD-10-CM | POA: Diagnosis not present

## 2019-11-24 DIAGNOSIS — K219 Gastro-esophageal reflux disease without esophagitis: Secondary | ICD-10-CM | POA: Diagnosis not present

## 2019-11-24 DIAGNOSIS — K2990 Gastroduodenitis, unspecified, without bleeding: Secondary | ICD-10-CM | POA: Diagnosis not present

## 2019-11-24 DIAGNOSIS — Z79899 Other long term (current) drug therapy: Secondary | ICD-10-CM | POA: Diagnosis not present

## 2019-11-24 DIAGNOSIS — K2970 Gastritis, unspecified, without bleeding: Secondary | ICD-10-CM

## 2019-11-24 DIAGNOSIS — E785 Hyperlipidemia, unspecified: Secondary | ICD-10-CM | POA: Insufficient documentation

## 2019-11-24 DIAGNOSIS — R1310 Dysphagia, unspecified: Secondary | ICD-10-CM

## 2019-11-24 DIAGNOSIS — Z8249 Family history of ischemic heart disease and other diseases of the circulatory system: Secondary | ICD-10-CM | POA: Diagnosis not present

## 2019-11-24 DIAGNOSIS — Z888 Allergy status to other drugs, medicaments and biological substances status: Secondary | ICD-10-CM | POA: Diagnosis not present

## 2019-11-24 DIAGNOSIS — I10 Essential (primary) hypertension: Secondary | ICD-10-CM | POA: Diagnosis not present

## 2019-11-24 HISTORY — PX: BRAVO PH STUDY: SHX5421

## 2019-11-24 HISTORY — PX: ESOPHAGOGASTRODUODENOSCOPY (EGD) WITH PROPOFOL: SHX5813

## 2019-11-24 HISTORY — PX: BIOPSY: SHX5522

## 2019-11-24 SURGERY — ESOPHAGOGASTRODUODENOSCOPY (EGD) WITH PROPOFOL
Anesthesia: Monitor Anesthesia Care

## 2019-11-24 MED ORDER — LIDOCAINE HCL (CARDIAC) PF 100 MG/5ML IV SOSY
PREFILLED_SYRINGE | INTRAVENOUS | Status: DC | PRN
  Administered 2019-11-24: 100 mg via INTRATRACHEAL

## 2019-11-24 MED ORDER — PROPOFOL 10 MG/ML IV BOLUS
INTRAVENOUS | Status: AC
  Filled 2019-11-24: qty 20

## 2019-11-24 MED ORDER — LACTATED RINGERS IV SOLN: INTRAVENOUS | Status: DC | PRN

## 2019-11-24 MED ORDER — ALBUTEROL SULFATE HFA 108 (90 BASE) MCG/ACT IN AERS
INHALATION_SPRAY | RESPIRATORY_TRACT | Status: AC
  Filled 2019-11-24: qty 6.7

## 2019-11-24 MED ORDER — ONDANSETRON HCL 4 MG/2ML IJ SOLN
INTRAMUSCULAR | Status: DC | PRN
  Administered 2019-11-24: 4 mg via INTRAVENOUS

## 2019-11-24 MED ORDER — PROPOFOL 500 MG/50ML IV EMUL
INTRAVENOUS | Status: AC
  Filled 2019-11-24: qty 150

## 2019-11-24 MED ORDER — PROPOFOL 10 MG/ML IV BOLUS
INTRAVENOUS | Status: AC
  Filled 2019-11-24: qty 40

## 2019-11-24 MED ORDER — PROPOFOL 500 MG/50ML IV EMUL
INTRAVENOUS | Status: DC | PRN
  Administered 2019-11-24: 125 ug/kg/min via INTRAVENOUS
  Administered 2019-11-24: 50 mg via INTRAVENOUS

## 2019-11-24 SURGICAL SUPPLY — 15 items

## 2019-11-24 NOTE — Anesthesia Postprocedure Evaluation (Signed)
Anesthesia Post Note  Patient: Breanna Sharp  Procedure(s) Performed: ESOPHAGOGASTRODUODENOSCOPY (EGD) WITH PROPOFOL (N/A ) BRAVO PH STUDY (N/A ) BIOPSY     Patient location during evaluation: PACU Anesthesia Type: MAC Level of consciousness: awake and alert Pain management: pain level controlled Vital Signs Assessment: post-procedure vital signs reviewed and stable Respiratory status: spontaneous breathing Cardiovascular status: stable Anesthetic complications: no    Last Vitals:  Vitals:   11/24/19 0911 11/24/19 0920  BP: 116/73 129/67  Pulse: 70 80  Resp: 17 18  Temp:    SpO2: 96% 97%    Last Pain:  Vitals:   11/24/19 0920  TempSrc:   PainSc: 0-No pain                 Nolon Nations

## 2019-11-24 NOTE — Discharge Instructions (Signed)
YOU HAD AN ENDOSCOPIC PROCEDURE TODAY: Refer to the procedure report and other information in the discharge instructions given to you for any specific questions about what was found during the examination. If this information does not answer your questions, please call [place] office at [phone] to clarify.   YOU SHOULD EXPECT: Some feelings of bloating in the abdomen. Passage of more gas than usual. Walking can help get rid of the air that was put into your GI tract during the procedure and reduce the bloating. If you had a lower endoscopy (such as a colonoscopy or flexible sigmoidoscopy) you may notice spotting of blood in your stool or on the toilet paper. Some abdominal soreness may be present for a day or two, also.  DIET: Your first meal following the procedure should be a light meal and then it is ok to progress to your normal diet. A half-sandwich or bowl of soup is an example of a good first meal. Heavy or fried foods are harder to digest and may make you feel nauseous or bloated. Drink plenty of fluids but you should avoid alcoholic beverages for 24 hours. If you had a esophageal dilation, please see attached instructions for diet.    ACTIVITY: Your care partner should take you home directly after the procedure. You should plan to take it easy, moving slowly for the rest of the day. You can resume normal activity the day after the procedure however YOU SHOULD NOT DRIVE, use power tools, machinery or perform tasks that involve climbing or major physical exertion for 24 hours (because of the sedation medicines used during the test).   SYMPTOMS TO REPORT IMMEDIATELY: A gastroenterologist can be reached at any hour. Please call [phone]  for any of the following symptoms:   Following upper endoscopy (EGD, EUS, ERCP, esophageal dilation) Vomiting of blood or coffee ground material  New, significant abdominal pain  New, significant chest pain or pain under the shoulder blades  Painful or  persistently difficult swallowing  New shortness of breath  Black, tarry-looking or red, bloody stools  FOLLOW UP:  If any biopsies were taken you will be contacted by phone or by letter within the next 1-3 weeks. Call [phone]  if you have not heard about the biopsies in 3 weeks.  Please also call with any specific questions about appointments or follow up tests.  

## 2019-11-24 NOTE — Op Note (Signed)
[facility] Patient Name: Breanna Sharp Procedure Date: 11/24/2019 MRN: [id number] Attending MD: Mauri Pole , MD Date of Birth: August 02, 1951 CSN: [id number] Age: 68 Admit Type: Outpatient Procedure:                Upper GI endoscopy Indications:              Dysphagia, Epigastric abdominal pain, Exclusion of                            esophageal reflux, Globus sensation Providers:                Mauri Pole, MD, Baird Cancer, RN, Lazaro Arms, Technician, Marguerita Merles, Technician Referring MD:              Medicines:                Monitored Anesthesia Care Complications:            No immediate complications. Estimated Blood Loss:     Estimated blood loss was minimal. Procedure:                Pre-Anesthesia Assessment:                           - Prior to the procedure, a History and Physical                            was performed, and patient medications and                            allergies were reviewed. The patient's tolerance of                            previous anesthesia was also reviewed. The risks                            and benefits of the procedure and the sedation                            options and risks were discussed with the patient.                            All questions were answered, and informed consent                            was obtained. Prior Anticoagulants: The patient has                            taken no previous anticoagulant or antiplatelet                            agents. ASA Grade Assessment: II - A patient with  mild systemic disease. After reviewing the risks                            and benefits, the patient was deemed in                            satisfactory condition to undergo the procedure.                           After obtaining informed consent, the endoscope was                            passed under direct vision. Throughout  the                            procedure, the patient's blood pressure, pulse, and                            oxygen saturations were monitored continuously. The                            [id number]-[id number]) Olympus gastroscope was                            introduced through the mouth, and advanced to the                            second part of duodenum. The upper GI endoscopy was                            accomplished without difficulty. The patient                            tolerated the procedure well. Scope In: Scope Out: Findings:      A small hiatal hernia was present.      No endoscopic abnormality was evident in the esophagus to explain the       patient's complaint of dysphagia.      The Z-line was regular and was found 38 cm from the incisors. The BRAVO       capsule with delivery system was introduced through the mouth and       advanced into the esophagus, such that the BRAVO pH capsule was       positioned 32 cm from the incisors, which was 6 cm proximal to the GE       junction. The BRAVO pH capsule was then deployed and attached to the       esophageal mucosa. The delivery system was then withdrawn. Endoscopy was       utilized for probe placement and diagnostic evaluation. The scope was       reinserted to evaluate placement of the BRAVO capsule. Visualization       showed the BRAVO capsule to be in an appropriate position.      Patchy mild inflammation characterized by congestion (edema), erythema       and linear erosions was found in the gastric antrum and in the  prepyloric region of the stomach. Biopsies were taken with a cold       forceps for histology. Biopsies were taken with a cold forceps for       Helicobacter pylori testing.      The examined duodenum was normal. Impression:               - Small hiatal hernia.                           - No endoscopic esophageal abnormality to explain                            patient's dysphagia.                            - Z-line regular, 38 cm from the incisors.                           - Gastritis. Biopsied.                           - Normal examined duodenum.                           - The BRAVO pH capsule was deployed. Moderate Sedation:      Not Applicable - Patient had care per Anesthesia. Recommendation:           - Patient has a contact number available for                            emergencies. The signs and symptoms of potential                            delayed complications were discussed with the                            patient. Return to normal activities tomorrow.                            Written discharge instructions were provided to the                            patient.                           - Resume previous diet.                           - Continue present medications.                           - Await pathology results.                           - Bravo study will be reported once the recorder is  returned in 48-72 hours, please refer to it for                            additional details and recommendations                           - Return to my office in 2 months. Procedure Code(s):        --- Professional ---                           [phone], Esophagogastroduodenoscopy, flexible,                            transoral; with biopsy, single or multiple Diagnosis Code(s):        --- Professional ---                           K44.9, Diaphragmatic hernia without obstruction or                            gangrene                           R13.10, Dysphagia, unspecified                           K29.70, Gastritis, unspecified, without bleeding                           R10.13, Epigastric pain                           F45.8, Other somatoform disorders CPT copyright 2019 American Medical Association. All rights reserved. The codes documented in this report are preliminary and upon coder review may  be revised to meet current  compliance requirements. Mauri Pole, MD 11/24/2019 8:58:34 AM This report has been signed electronically. Number of Addenda: 0

## 2019-11-24 NOTE — H&P (Signed)
[facility] History and Physical   Primary Care Physician:  Wendie Agreste, MD   Reason for Procedure:  GERD, globus sensation, dysphagia Plan:    EGD and bravo placement     HPI: Breanna Sharp is a 68 y.o. female here for EGD and 48 hr pH Bravo placement for further evaluation of GERD, persistent globus sensation and dysphagia.  Denies any nausea, vomiting, abdominal pain, melena or bright red blood per rectum    Past Medical History:  Diagnosis Date  . Allergy   . Anxiety   . Arthritis   . Cancer ([place])   . Dyslipidemia    statin intolerance  . GERD (gastroesophageal reflux disease)   . Heart murmur   . Hyperlipidemia   . Hypertension   . Vertigo     Past Surgical History:  Procedure Laterality Date  . [place] STUDY N/A 09/29/2019   Procedure: [place] STUDY;  Surgeon: Mauri Pole, MD;  Location: [facility];  Service: Endoscopy;  Laterality: N/A;  . ABDOMINAL HYSTERECTOMY  1972   cervical cancer  . COLONOSCOPY    . ESOPHAGEAL MANOMETRY N/A 09/29/2019   Procedure: ESOPHAGEAL MANOMETRY (EM);  Surgeon: Mauri Pole, MD;  Location: [facility];  Service: Endoscopy;  Laterality: N/A;  . WISDOM TOOTH EXTRACTION     uppers    Prior to Admission medications   Medication Sig Start Date End Date Taking? Authorizing Provider  acetaminophen (TYLENOL) 650 MG CR tablet Take 1,300 mg by mouth every 8 (eight) hours as needed for pain.   Yes [provider]  Alum Hydroxide-Mag Carbonate (GAVISCON EXTRA STRENGTH) 160-105 MG CHEW Chew 2-4 tablets by mouth 2 (two) times daily as needed (heartburn).   Yes [provider]  [place] Oil-Levomenthol (FDGARD PO) Take 1 capsule by mouth 2 (two) times daily as needed (heartburn).   Yes [provider]  Carboxymethylcellul-Glycerin (LUBRICATING EYE DROPS OP) Place 2 drops into both eyes daily.   Yes [provider]  Cholecalciferol (VITAMIN D) 50 MCG (2000 UT) CAPS  Take 2,000 Units by mouth daily after lunch.   Yes [provider]  ezetimibe (ZETIA) 10 MG tablet Take 1 tablet (10 mg total) by mouth daily. 08/28/19  Yes Hilty, Nadean Corwin, MD  fexofenadine (ALLEGRA) 180 MG tablet Take 180 mg by mouth daily.  10/04/13  Yes [provider]  fluticasone (FLONASE) 50 MCG/ACT nasal spray Place 2 sprays into both nostrils daily. 07/23/19  Yes Wendie Agreste, MD  Inulin (FIBER CHOICE PO) Take 2 capsules by mouth daily after supper.   Yes [provider]  lisinopril-hydrochlorothiazide (ZESTORETIC) 20-12.5 MG tablet Take 1 tablet by mouth daily. 07/23/19  Yes Wendie Agreste, MD  Multiple Vitamin (MULTIVITAMIN WITH MINERALS) TABS tablet Take 1 tablet by mouth daily.   Yes [provider]  Pitavastatin Calcium (LIVALO) 1 MG TABS Take 1 tablet (1 mg total) by mouth daily. 08/28/19  Yes Hilty, Nadean Corwin, MD  Tiotropium Bromide Monohydrate (SPIRIVA RESPIMAT) 2.5 MCG/ACT AERS Take 2 puffs by mouth daily. 07/23/19  Yes Wendie Agreste, MD  Travoprost, BAK Free, (TRAVATAN) 0.004 % SOLN ophthalmic solution Place 1 drop into both eyes at bedtime.   Yes [provider]  Omeprazole 20 MG TBEC Take 2 tablets (40 mg total) by mouth daily before breakfast. 08/08/19   Mauri Pole, MD    No current facility-administered medications for this encounter.   Facility-Administered Medications Ordered in Other Encounters  Medication Dose Route Frequency Provider Last Rate Last Admin  . lactated ringers infusion   Intravenous Continuous PRN Lavina Hamman, CRNA   New Bag at 11/24/19 [id number]    Allergies as of 10/13/2019 - Review Complete 10/08/2019  Allergen Reaction Noted  . Statins  05/15/2012    Family History  Problem Relation Age of Onset  . Hypertension Mother        dx in her 53's  . Heart attack Mother 17  . Breast cancer Mother        unsure of age  . Heart attack Brother 2  . Diabetes Brother   . Hyperlipidemia  Brother   . Hypertension Brother   . Heart disease Brother   . Colon cancer Neg Hx   . Esophageal cancer Neg Hx   . Rectal cancer Neg Hx   . Stomach cancer Neg Hx     Social History   Socioeconomic History  . Marital status: Married    Spouse name: [name]  . Number of children: 0  . Years of education: 57  . Highest education level: Not on file  Occupational History    Employer: RETIRED    Comment: Retired  Tobacco Use  . Smoking status: Former Smoker    Packs/day: 0.50    Years: 32.00    Pack years: 16.00    Types: Cigarettes    Quit date: 11/27/2002    Years since quitting: 17.0  . Smokeless tobacco: Never Used  Substance and Sexual Activity  . Alcohol use: Yes    Alcohol/week: 3.0 standard drinks    Types: 3 Standard drinks or equivalent per week    Comment: social - 2 or 3 times a month 1-2 glasses of wine  . Drug use: No  . Sexual activity: Yes    Partners: Male  Other Topics Concern  . Not on file  Social History Narrative   Exercise 3 to 4 times/week walking for 45 min -1 hour   Patient lives at home with her husband Legrand [place]).   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   Right handed.   decaffeine Green Tea.   Social Determinants of Health   Financial Resource Strain:   . Difficulty of Paying Living Expenses: Not on file  Food Insecurity:   . Worried About Charity fundraiser in the Last Year: Not on file  . Ran Out of Food in the Last Year: Not on file  Transportation Needs:   . Lack of Transportation (Medical): Not on file  . Lack of Transportation (Non-Medical): Not on file  Physical Activity:   . Days of Exercise per Week: Not on file  . Minutes of Exercise per Session: Not on file  Stress:   . Feeling of Stress : Not on file  Social Connections:   . Frequency of Communication with Friends and Family: Not on file  . Frequency of Social Gatherings with  Friends and Family: Not on file  . Attends Religious Services: Not on file  . Active Member of Clubs or Organizations: Not on file  . Attends Archivist Meetings: Not on file  . Marital Status: Not on file  Intimate Partner Violence:   . Fear of Current or Ex-Partner: Not on file  . Emotionally Abused: Not on file  . Physically Abused: Not on file  . Sexually Abused: Not on file    Review  of Systems:  All other review of systems negative except as mentioned in the HPI.  Physical Exam: Vital signs in last 24 hours: Temp:  [98.1 F (36.7 C)] 98.1 F (36.7 C) (12/28 0736) Pulse Rate:  [74] 74 (12/28 0736) Resp:  [18] 18 (12/28 0736) BP: (141)/(79) 141/79 (12/28 0736) SpO2:  [98 %] 98 % (12/28 0736)   General:   Alert,  Well-developed, well-nourished, pleasant and cooperative in NAD Lungs:  Clear throughout to auscultation.   Heart:  Regular rate and rhythm; no murmurs, clicks, rubs,  or gallops. Abdomen:  Soft, nontender and nondistended. Normal bowel sounds.   Neuro/Psych:  Alert and cooperative. Normal mood and affect. A and O x 3   K. Denzil Magnuson , MD [phone]

## 2019-11-24 NOTE — Transfer of Care (Signed)
Immediate Anesthesia Transfer of Care Note  Patient: Breanna Sharp  Procedure(s) Performed: Procedure(s): ESOPHAGOGASTRODUODENOSCOPY (EGD) WITH PROPOFOL (N/A) BRAVO PH STUDY (N/A) BIOPSY  Patient Location: PACU  Anesthesia Type:MAC  Level of Consciousness:  sedated, patient cooperative and responds to stimulation  Airway & Oxygen Therapy:Patient Spontanous Breathing and Patient connected to face mask oxgen  Post-op Assessment:  Report given to PACU RN and Post -op Vital signs reviewed and stable  Post vital signs:  Reviewed and stable  Last Vitals:  Vitals:   11/24/19 0736  BP: (!) 141/79  Pulse: 74  Resp: 18  Temp: 36.7 C  SpO2: 70%    Complications: No apparent anesthesia complications

## 2019-11-25 ENCOUNTER — Other Ambulatory Visit: Payer: Self-pay

## 2019-11-25 LAB — SURGICAL PATHOLOGY

## 2019-11-26 ENCOUNTER — Encounter: Payer: Self-pay | Admitting: *Deleted

## 2019-12-05 ENCOUNTER — Other Ambulatory Visit: Payer: Self-pay | Admitting: Family Medicine

## 2019-12-05 DIAGNOSIS — I10 Essential (primary) hypertension: Secondary | ICD-10-CM

## 2019-12-05 NOTE — Telephone Encounter (Signed)
Forwarding medication refill request to the clinical pool for review. 

## 2019-12-11 ENCOUNTER — Other Ambulatory Visit: Payer: Self-pay

## 2019-12-11 ENCOUNTER — Encounter: Payer: Self-pay | Admitting: Gastroenterology

## 2019-12-11 DIAGNOSIS — H401131 Primary open-angle glaucoma, bilateral, mild stage: Secondary | ICD-10-CM | POA: Diagnosis not present

## 2019-12-11 DIAGNOSIS — H18413 Arcus senilis, bilateral: Secondary | ICD-10-CM | POA: Diagnosis not present

## 2019-12-11 DIAGNOSIS — H25013 Cortical age-related cataract, bilateral: Secondary | ICD-10-CM | POA: Diagnosis not present

## 2019-12-11 DIAGNOSIS — H2513 Age-related nuclear cataract, bilateral: Secondary | ICD-10-CM | POA: Diagnosis not present

## 2019-12-11 MED ORDER — AMBULATORY NON FORMULARY MEDICATION: 1.0000 mg | Freq: Two times a day (BID) | 0 refills | Status: AC

## 2019-12-11 MED ORDER — ESOMEPRAZOLE MAGNESIUM 40 MG PO CPDR: 40.0000 mg | DELAYED_RELEASE_CAPSULE | Freq: Every day | ORAL | 3 refills | Status: DC

## 2019-12-11 NOTE — Telephone Encounter (Signed)
Opened in Error.

## 2019-12-24 DIAGNOSIS — H6123 Impacted cerumen, bilateral: Secondary | ICD-10-CM | POA: Diagnosis not present

## 2019-12-29 ENCOUNTER — Telehealth: Payer: Self-pay | Admitting: Gastroenterology

## 2019-12-29 NOTE — Telephone Encounter (Signed)
Dr Silverio Decamp is virtual ok?

## 2019-12-29 NOTE — Telephone Encounter (Signed)
Patient has an appointment scheduled on 2/9 with Dr. Silverio Decamp at 11:00. She is asking if she can do a virtual appointment instead of coming into the office. She states she does not feel comfortable coming in right now.

## 2019-12-29 NOTE — Telephone Encounter (Signed)
ERROR

## 2019-12-29 NOTE — Telephone Encounter (Signed)
Ok to switch to [employer]. Thanks

## 2020-01-01 ENCOUNTER — Encounter: Payer: Self-pay | Admitting: Gastroenterology

## 2020-01-01 NOTE — Telephone Encounter (Signed)
Patient has been informed that we will do a Doximity visit for her appointment. Her history / meds have been updated.

## 2020-01-06 ENCOUNTER — Ambulatory Visit (INDEPENDENT_AMBULATORY_CARE_PROVIDER_SITE_OTHER): Payer: Medicare Other | Admitting: Gastroenterology

## 2020-01-06 ENCOUNTER — Encounter: Payer: Self-pay | Admitting: Gastroenterology

## 2020-01-06 DIAGNOSIS — R1013 Epigastric pain: Secondary | ICD-10-CM

## 2020-01-06 DIAGNOSIS — K5281 Eosinophilic gastritis or gastroenteritis: Secondary | ICD-10-CM

## 2020-01-06 DIAGNOSIS — K219 Gastro-esophageal reflux disease without esophagitis: Secondary | ICD-10-CM | POA: Diagnosis not present

## 2020-01-06 MED ORDER — ESOMEPRAZOLE MAGNESIUM 40 MG PO CPDR: 40.0000 mg | DELAYED_RELEASE_CAPSULE | Freq: Every day | ORAL | 3 refills | Status: DC

## 2020-01-06 NOTE — Patient Instructions (Addendum)
Please send refill for Nexium daily and Gaviscon as needed after meals 2-3 times daily, 90-day supply with refills for a year to mail in pharmacy  Follow-up office visit in 6 months  If you are age 70 or older, your body mass index should be between 23-30. Your There is no height or weight on file to calculate BMI. If this is out of the aforementioned range listed, please consider follow up with your Primary Care Provider.  If you are age 34 or younger, your body mass index should be between 19-25. Your There is no height or weight on file to calculate BMI. If this is out of the aformentioned range listed, please consider follow up with your Primary Care Provider.    I appreciate the  opportunity to care for you  Thank You   Harl Bowie , MD

## 2020-01-06 NOTE — Progress Notes (Signed)
Breanna Sharp    [id number]    03-18-1951  Primary Care Physician:Greene, Ranell Patrick, MD  Referring Physician: Wendie Agreste, MD [address] [place],  [place] [address]  This service was provided via  telemedicine due to [place] 19 pandemic.  I connected with@ on 01/08/20 at 11:00 AM EST by a video enabled telemedicine application and verified that I am speaking with the correct person using two identifiers.  Patient location: Home Provider location: Office   I discussed the limitations, risks, security and privacy concerns of performing an evaluation and management service by video enabled telemedicine application and the availability of in person appointments. I also discussed with the patient that there may be a patient responsible charge related to this service. The patient expressed understanding and agreed to proceed.   The persons participating in this telemedicine service were myself and the patient     Chief complaint: GERD dyspepsia  HPI:  69 year old female here for follow-up for GERD She is overall doing much better.  Currently reflux symptoms are well controlled with Nexium 40 mg daily.  She has mild heartburn about 2 hours after breakfast on most days, improves as soon as she eats a small snack.  Overall she feels her symptoms are very minimal.  She is taking Gaviscon suspension twice daily and is not needing FD guard as often.  Denies any dysphagia, abdominal pain, nausea, vomiting, melena, change in bowel habits or blood per rectum.  Weight is stable.  EGD with 48 hr pH Bravo: Evidence of increased gastroesophageal acid reflux.  Gastric biopsies positive for eosinophilic gastritis  Esophageal manometry: 09/29/19: Showed findings of EGJ outflow obstruction  She didn't tolerate 24 pH impedance, patient had it pulled out in less than 1 hour. Per patient the tube was coiling multiple times and it was pulled and replaced multiple time irritate her  nose, she was gaging and couldn't tolerate it.  EGD 05/26/2019: showed normal esophagus with irregular Z line, mild gastritis biopsies negative for H. pylori, dysplasia, intestinal metaplasia or malignancy. Normal duodenum  Colonoscopy September 12, 2017: Sessile polyps and left-sided diverticulosis.    Outpatient Encounter Medications as of 01/06/2020  Medication Sig  . acetaminophen (TYLENOL) 650 MG CR tablet Take 1,300 mg by mouth every 8 (eight) hours as needed for pain.  [name] AMBULATORY NON FORMULARY MEDICATION Take 1 mg by mouth 2 (two) times daily. Medication Name: Budesonide 1 mg compounded suspension   Take twice daily for eosinophilic gastritis  . Carboxymethylcellul-Glycerin (LUBRICATING EYE DROPS OP) Place 2 drops into both eyes daily.  [name] esomeprazole (NEXIUM) 40 MG capsule Take 1 capsule (40 mg total) by mouth daily before breakfast.  . ezetimibe (ZETIA) 10 MG tablet Take 1 tablet (10 mg total) by mouth daily.  . fexofenadine (ALLEGRA) 180 MG tablet Take 180 mg by mouth daily.   . fluticasone (FLONASE) 50 MCG/ACT nasal spray Place 2 sprays into both nostrils daily.  . Inulin (FIBER CHOICE PO) Take 2 capsules by mouth daily after supper.  [name] lisinopril-hydrochlorothiazide (ZESTORETIC) 20-12.5 MG tablet TAKE 1 TABLET DAILY  . Multiple Vitamin (MULTIVITAMIN WITH MINERALS) TABS tablet Take 1 tablet by mouth daily.  . Pitavastatin Calcium (LIVALO) 1 MG TABS Take 1 tablet (1 mg total) by mouth daily.  . Tiotropium Bromide Monohydrate (SPIRIVA RESPIMAT) 2.5 MCG/ACT AERS Take 2 puffs by mouth daily.  . Travoprost, BAK Free, (TRAVATAN) 0.004 % SOLN ophthalmic solution Place 1 drop into  both eyes at bedtime.  . [DISCONTINUED] esomeprazole (NEXIUM) 40 MG capsule Take 1 capsule (40 mg total) by mouth daily before breakfast.   No facility-administered encounter medications on file as of 01/06/2020.    Allergies as of 01/06/2020 - Review Complete 01/06/2020  Allergen Reaction Noted  . Statins   05/15/2012    Past Medical History:  Diagnosis Date  . Allergy   . Anxiety   . Arthritis   . Cancer ([place])   . Dyslipidemia    statin intolerance  . GERD (gastroesophageal reflux disease)   . Heart murmur   . Hyperlipidemia   . Hypertension   . Vertigo     Past Surgical History:  Procedure Laterality Date  . [place] STUDY N/A 09/29/2019   Procedure: [place] STUDY;  Surgeon: Mauri Pole, MD;  [facility];  Service: Endoscopy;  Laterality: N/A;  . ABDOMINAL HYSTERECTOMY  1972   cervical cancer  . BIOPSY  11/24/2019   Procedure: BIOPSY;  Surgeon: Mauri Pole, MD;  Location: [facility];  Service: Endoscopy;;  . BRAVO [place] STUDY N/A 11/24/2019   Procedure: BRAVO [place];  Surgeon: Mauri Pole, MD;  Location: [facility];  Service: Endoscopy;  Laterality: N/A;  . COLONOSCOPY    . ESOPHAGEAL MANOMETRY N/A 09/29/2019   Procedure: ESOPHAGEAL MANOMETRY (EM);  Surgeon: Mauri Pole, MD;  [facility];  Service: Endoscopy;  Laterality: N/A;  . ESOPHAGOGASTRODUODENOSCOPY (EGD) WITH PROPOFOL N/A 11/24/2019   Procedure: ESOPHAGOGASTRODUODENOSCOPY (EGD) WITH PROPOFOL;  Surgeon: Mauri Pole, MD;  [facility];  Service: Endoscopy;  Laterality: N/A;  . WISDOM TOOTH EXTRACTION     uppers    Family History  Problem Relation Age of Onset  . Hypertension Mother        dx in her 22's  . Heart attack Mother 40  . Breast cancer Mother        unsure of age  . Heart attack Brother 71  . Diabetes Brother   . Hyperlipidemia Brother   . Hypertension Brother   . Heart disease Brother   . Colon cancer Neg Hx   . Esophageal cancer Neg Hx   . Rectal cancer Neg Hx   . Stomach cancer Neg Hx     Social History   Socioeconomic History  . Marital status: Married    Spouse name: [name]  . Number of children: 0  . Years of education: 41  . Highest education level: Not on file  Occupational History    Employer: RETIRED      Comment: Retired  Tobacco Use  . Smoking status: Former Smoker    Packs/day: 0.50    Years: 32.00    Pack years: 16.00    Types: Cigarettes    Quit date: 11/27/2002    Years since quitting: 17.1  . Smokeless tobacco: Never Used  Substance and Sexual Activity  . Alcohol use: Yes    Alcohol/week: 3.0 standard drinks    Types: 3 Standard drinks or equivalent per week    Comment: social - 2 or 3 times a month 1-2 glasses of wine  . Drug use: No  . Sexual activity: Yes    Partners: Male  Other Topics Concern  . Not on file  Social History Narrative   Exercise 3 to 4 times/week walking for 45 min -1 hour   Patient lives at home with her husband Breanna Sharp).   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   Right handed.   decaffeine Green Tea.   Social Determinants of Health   Financial Resource Strain:   . Difficulty of Paying Living Expenses: Not on file  Food Insecurity:   . Worried About Charity fundraiser in the Last Year: Not on file  . Ran Out of Food in the Last Year: Not on file  Transportation Needs:   . Lack of Transportation (Medical): Not on file  . Lack of Transportation (Non-Medical): Not on file  Physical Activity:   . Days of Exercise per Week: Not on file  . Minutes of Exercise per Session: Not on file  Stress:   . Feeling of Stress : Not on file  Social Connections:   . Frequency of Communication with Friends and Family: Not on file  . Frequency of Social Gatherings with Friends and Family: Not on file  . Attends Religious Services: Not on file  . Active Member of Clubs or Organizations: Not on file  . Attends Archivist Meetings: Not on file  . Marital Status: Not on file  Intimate Partner Violence:   . Fear of Current or Ex-Partner: Not on file  . Emotionally Abused: Not on file  . Physically Abused: Not on file  . Sexually Abused: Not on file       Review of systems: Review of Systems as per HPI All other systems reviewed and are negative.   Observations/Objective:   Data Reviewed:  Reviewed labs, radiology imaging, old records and pertinent past GI work up   Assessment and Plan/Recommendations:  69 year old female with chronic GERD and dyspepsia  GERD: Continue Nexium 40 mg daily Discussed antireflux measures and lifestyle modifications  Eosinophilic gastritis unclear etiology: Given her symptoms have improved and is currently not having significant dyspepsia symptoms will hold off starting budesonide suspension Okay to use Gaviscon as needed after meals  Return in 6 months or sooner if needed   I discussed the assessment and treatment plan with the patient. The patient was provided an opportunity to ask questions and all were answered. The patient agreed with the plan and demonstrated an understanding of the instructions.   The patient was advised to call back or seek an in-person evaluation if the symptoms worsen or if the condition fails to improve as anticipated.  I provided 30 minutes of non-face-to-face time during this encounter.   Harl Bowie, MD   CC: Wendie Agreste, MD

## 2020-01-12 ENCOUNTER — Other Ambulatory Visit: Payer: Self-pay

## 2020-01-12 ENCOUNTER — Encounter: Payer: Self-pay | Admitting: Family Medicine

## 2020-01-12 ENCOUNTER — Ambulatory Visit (INDEPENDENT_AMBULATORY_CARE_PROVIDER_SITE_OTHER): Payer: Medicare Other | Admitting: Family Medicine

## 2020-01-12 VITALS — BP 146/84 | HR 98 | Temp 98.4°F | Ht 63.0 in | Wt 132.2 lb

## 2020-01-12 DIAGNOSIS — J449 Chronic obstructive pulmonary disease, unspecified: Secondary | ICD-10-CM

## 2020-01-12 DIAGNOSIS — R82998 Other abnormal findings in urine: Secondary | ICD-10-CM | POA: Diagnosis not present

## 2020-01-12 DIAGNOSIS — J301 Allergic rhinitis due to pollen: Secondary | ICD-10-CM

## 2020-01-12 DIAGNOSIS — I10 Essential (primary) hypertension: Secondary | ICD-10-CM | POA: Diagnosis not present

## 2020-01-12 DIAGNOSIS — E785 Hyperlipidemia, unspecified: Secondary | ICD-10-CM

## 2020-01-12 DIAGNOSIS — R7303 Prediabetes: Secondary | ICD-10-CM

## 2020-01-12 LAB — POCT URINALYSIS DIP (MANUAL ENTRY)
Bilirubin, UA: NEGATIVE
Glucose, UA: NEGATIVE mg/dL
Ketones, POC UA: NEGATIVE mg/dL
Leukocytes, UA: NEGATIVE
Nitrite, UA: NEGATIVE
Protein Ur, POC: NEGATIVE mg/dL
Spec Grav, UA: >=1.03 — AB (ref 1.010–1.025)
Urobilinogen, UA: 0.2 E.U./dL
pH, UA: 6 (ref 5.0–8.0)

## 2020-01-12 MED ORDER — SPIRIVA RESPIMAT 2.5 MCG/ACT IN AERS: 2.0000 | INHALATION_SPRAY | Freq: Every day | RESPIRATORY_TRACT | 6 refills | Status: DC

## 2020-01-12 MED ORDER — FLUTICASONE PROPIONATE 50 MCG/ACT NA SUSP: 2.0000 | Freq: Every day | NASAL | 6 refills | Status: DC

## 2020-01-12 NOTE — Patient Instructions (Addendum)
Nurse visit in next 2 weeks with your machine to make sure it is accurate. Can decide on med changes at that time.   I will check another urine test, but one in office did not have protein. If increased bubbles in the urine, or not improving I can have you evaluated by urology if needed. Let me know.     How to Take Your Blood Pressure Blood pressure is a measurement of how strongly your blood is pressing against the walls of your arteries. Arteries are blood vessels that carry blood from your heart throughout your body. Your health care provider takes your blood pressure at each office visit. You can also take your own blood pressure at home with a blood pressure machine. You may need to take your own blood pressure:  To confirm a diagnosis of high blood pressure (hypertension).  To monitor your blood pressure over time.  To make sure your blood pressure medicine is working. Supplies needed: To take your blood pressure, you will need a blood pressure machine. You can buy a blood pressure machine, or blood pressure monitor, at most drugstores or online. There are several types of home blood pressure monitors. When choosing one, consider the following:  Choose a monitor that has an arm cuff.  Choose a cuff that wraps snugly around your upper arm. You should be able to fit only one finger between your arm and the cuff.  Do not choose a monitor that measures your blood pressure from your wrist or finger. Your health care provider can suggest a reliable monitor that will meet your needs. How to prepare To get the most accurate reading, avoid the following for 30 minutes before you check your blood pressure:  Drinking caffeine.  Drinking alcohol.  Eating.  Smoking.  Exercising. Five minutes before you check your blood pressure:  Empty your bladder.  Sit quietly without talking in a dining chair, rather than in a soft couch or armchair. How to take your blood pressure To check  your blood pressure, follow the instructions in the manual that came with your blood pressure monitor. If you have a digital blood pressure monitor, the instructions may be as follows: 1. Sit up straight. 2. Place your feet on the floor. Do not cross your ankles or legs. 3. Rest your left arm at the level of your heart on a table or desk or on the arm of a chair. 4. Pull up your shirt sleeve. 5. Wrap the blood pressure cuff around the upper part of your left arm, 1 inch (2.5 cm) above your elbow. It is best to wrap the cuff around bare skin. 6. Fit the cuff snugly around your arm. You should be able to place only one finger between the cuff and your arm. 7. Position the cord inside the groove of your elbow. 8. Press the power button. 9. Sit quietly while the cuff inflates and deflates. 10. Read the digital reading on the monitor screen and write it down (record it). 11. Wait 2-3 minutes, then repeat the steps, starting at step 1. What does my blood pressure reading mean? A blood pressure reading consists of a higher number over a lower number. Ideally, your blood pressure should be below 120/80. The first ("top") number is called the systolic pressure. It is a measure of the pressure in your arteries as your heart beats. The second ("bottom") number is called the diastolic pressure. It is a measure of the pressure in your arteries as  the heart relaxes. Blood pressure is classified into four stages. The following are the stages for adults who do not have a short-term serious illness or a chronic condition. Systolic pressure and diastolic pressure are measured in a unit called mm Hg. Normal  Systolic pressure: below 123456.  Diastolic pressure: below 80. Elevated  Systolic pressure: Q000111Q.  Diastolic pressure: below 80. Hypertension stage 1  Systolic pressure: 0000000.  Diastolic pressure: XX123456. Hypertension stage 2  Systolic pressure: XX123456 or above.  Diastolic pressure: 90 or  above. You can have prehypertension or hypertension even if only the systolic or only the diastolic number in your reading is higher than normal. Follow these instructions at home:  Check your blood pressure as often as recommended by your health care provider.  Take your monitor to the next appointment with your health care provider to make sure: ? That you are using it correctly. ? That it provides accurate readings.  Be sure you understand what your goal blood pressure numbers are.  Tell your health care provider if you are having any side effects from blood pressure medicine. Contact a health care provider if:  Your blood pressure is consistently high. Get help right away if:  Your systolic blood pressure is higher than 180.  Your diastolic blood pressure is higher than 110. This information is not intended to replace advice given to you by your health care provider. Make sure you discuss any questions you have with your health care provider. Document Revised: 10/26/2017 Document Reviewed: 04/21/2016 Elsevier Patient Education  [employer].   If you have lab work done today you will be contacted with your lab results within the next 2 weeks.  If you have not heard from [place] then please contact us. The fastest way to get your results is to register for My Chart.   IF you received an x-ray today, you will receive an invoice from [facility]. Please contact [facility] at [phone] with questions or concerns regarding your invoice.   IF you received labwork today, you will receive an invoice from [facility]. Please contact [facility] at [phone] with questions or concerns regarding your invoice.   Our billing staff will not be able to assist you with questions regarding bills from these companies.  You will be contacted with the lab results as soon as they are available. The fastest way to get your results is to activate your My Chart account.  Instructions are located on the last page of this paperwork. If you have not heard from [place] regarding the results in 2 weeks, please contact this office.

## 2020-01-12 NOTE — Progress Notes (Signed)
Subjective:  Patient ID: Breanna Sharp, female    DOB: 16-Feb-1951  Age: 69 y.o. MRN: [id number]  CC:  Chief Complaint  Patient presents with  . med review    6 m f/u   . Hypertension  . urine bubbling    over 2 months     HPI Breanna Sharp presents for   Hypertension: Lisinopril increased to 20 mg, remained on  12.5 mg combo in August 2020. Home readings:121/83, 130/80. 122/70, rare elevated 135/80.  No missed doses, no new side effects.   No chest pains. Episodic heartbeat in ear, not persistent.  BP Readings from Last 3 Encounters:  01/12/20 (!) 146/84  11/24/19 129/67  11/13/19 110/70   Lab Results  Component Value Date   CREATININE 0.85 09/22/2019   COPD: On Spiriva once per day. Needs refill. Rare shortness of breath - no new sx's - stable. Walking 4-5 miles per day.   Bubbles in urine: Notices bubbles in urine or foam in toilet after urinating past 2 months. Urinating normal, and normal amounts. No protein in urine on 06/04/19 U/A.  Drinking 70 ounces water per day.   Prediabetes borderline Lab Results  Component Value Date   HGBA1C 5.7 (H) 07/23/2019   Wt Readings from Last 3 Encounters:  01/12/20 132 lb 3.2 oz (60 kg)  11/13/19 133 lb (60.3 kg)  10/08/19 133 lb 4 oz (60.4 kg)  watching diet.  Has lost weight in past year or two. Exercising 5-7 days. Per week.   Hyperlipidemia: Livalo 1 mg daily, Zetia 10 mg daily. No new side effects with meds. Fasting today.  Lab Results  Component Value Date   CHOL 189 07/23/2019   HDL 60 07/23/2019   LDLCALC 103 (H) 07/23/2019   TRIG 128 07/23/2019   CHOLHDL 3.2 07/23/2019   Lab Results  Component Value Date   ALT 14 07/23/2019   AST 18 07/23/2019   ALKPHOS 68 07/23/2019   BILITOT 0.6 07/23/2019     History Patient Active Problem List   Diagnosis Date Noted  . Gastritis and gastroduodenitis   . Gastroesophageal reflux disease without esophagitis   . Esophageal dysphagia   . Globus sensation    . H/O abdominal hysterectomy 06/04/2019  . Vaginal discharge 06/04/2019  . History of cervical cancer 06/04/2019  . Vaginal atrophy 06/04/2019  . Labial lesion 06/04/2019  . Epidemic vertigo 01/30/2018  . COPD, group A, by GOLD 2017 classification ([place]) 06/06/2017  . Dyslipidemia 05/11/2016  . Shortness of breath 05/11/2016  . Essential hypertension 05/11/2016  . Osteopenia 10/12/2015  . DOE (dyspnea on exertion) 05/02/2013  . Chronic sinusitis 05/02/2013  . Vertigo 08/14/2012  . Benign essential HTN 08/14/2012  . BMI 28.0-28.9,adult 08/14/2012  . COLONIC POLYPS 01/24/2008  . ACUT GASTR ULCER W/O MENTION HEMORR PERF/OBST 01/24/2008   Past Medical History:  Diagnosis Date  . Allergy   . Anxiety   . Arthritis   . Cancer ([place])   . Dyslipidemia    statin intolerance  . GERD (gastroesophageal reflux disease)   . Heart murmur   . Hyperlipidemia   . Hypertension   . Vertigo    Past Surgical History:  Procedure Laterality Date  . [place] STUDY N/A 09/29/2019   Procedure: [place] STUDY;  Surgeon: Mauri Pole, MD;  Location: [facility];  Service: Endoscopy;  Laterality: N/A;  . ABDOMINAL HYSTERECTOMY  1972   cervical cancer  . BIOPSY  11/24/2019   Procedure:  BIOPSY;  Surgeon: Mauri Pole, MD;  Location: [facility];  Service: Endoscopy;;  . BRAVO [place] STUDY N/A 11/24/2019   Procedure: BRAVO [place];  Surgeon: Mauri Pole, MD;  Location: [facility];  Service: Endoscopy;  Laterality: N/A;  . COLONOSCOPY    . ESOPHAGEAL MANOMETRY N/A 09/29/2019   Procedure: ESOPHAGEAL MANOMETRY (EM);  Surgeon: Mauri Pole, MD;  Location: [facility];  Service: Endoscopy;  Laterality: N/A;  . ESOPHAGOGASTRODUODENOSCOPY (EGD) WITH PROPOFOL N/A 11/24/2019   Procedure: ESOPHAGOGASTRODUODENOSCOPY (EGD) WITH PROPOFOL;  Surgeon: Mauri Pole, MD;  Location: [facility];  Service: Endoscopy;  Laterality: N/A;  . WISDOM TOOTH EXTRACTION     uppers    Allergies  Allergen Reactions  . Statins     Muscle cramps   Prior to Admission medications   Medication Sig Start Date End Date Taking? Authorizing Provider  acetaminophen (TYLENOL) 650 MG CR tablet Take 1,300 mg by mouth every 8 (eight) hours as needed for pain.    [provider]  Carboxymethylcellul-Glycerin (LUBRICATING EYE DROPS OP) Place 2 drops into both eyes daily.    [provider]  esomeprazole (NEXIUM) 40 MG capsule Take 1 capsule (40 mg total) by mouth daily before breakfast. 01/06/20 04/05/20  Mauri Pole, MD  ezetimibe (ZETIA) 10 MG tablet Take 1 tablet (10 mg total) by mouth daily. 08/28/19   Hilty, Nadean Corwin, MD  fexofenadine (ALLEGRA) 180 MG tablet Take 180 mg by mouth daily.  10/04/13   [provider]  fluticasone (FLONASE) 50 MCG/ACT nasal spray Place 2 sprays into both nostrils daily. 07/23/19   Wendie Agreste, MD  Inulin (FIBER CHOICE PO) Take 2 capsules by mouth daily after supper.    [provider]  lisinopril-hydrochlorothiazide (ZESTORETIC) 20-12.5 MG tablet TAKE 1 TABLET DAILY 12/05/19   Wendie Agreste, MD  Multiple Vitamin (MULTIVITAMIN WITH MINERALS) TABS tablet Take 1 tablet by mouth daily.    [provider]  Pitavastatin Calcium (LIVALO) 1 MG TABS Take 1 tablet (1 mg total) by mouth daily. 08/28/19   Hilty, Nadean Corwin, MD  Tiotropium Bromide Monohydrate (SPIRIVA RESPIMAT) 2.5 MCG/ACT AERS Take 2 puffs by mouth daily. 07/23/19   Wendie Agreste, MD  Travoprost, BAK Free, (TRAVATAN) 0.004 % SOLN ophthalmic solution Place 1 drop into both eyes at bedtime.    [provider]   Social History   Socioeconomic History  . Marital status: Married    Spouse name: [name]  . Number of children: 0  . Years of education: 8  . Highest education level: Not on file  Occupational History    Employer: RETIRED    Comment: Retired  Tobacco Use  . Smoking status: Former Smoker    Packs/day: 0.50    Years:  32.00    Pack years: 16.00    Types: Cigarettes    Quit date: 11/27/2002    Years since quitting: 17.1  . Smokeless tobacco: Never Used  Substance and Sexual Activity  . Alcohol use: Yes    Alcohol/week: 3.0 standard drinks    Types: 3 Standard drinks or equivalent per week    Comment: social - 2 or 3 times a month 1-2 glasses of wine  . Drug use: No  . Sexual activity: Yes    Partners: Male  Other Topics Concern  . Not on file  Social History Narrative   Exercise 3 to 4 times/week walking for 45 min -1 hour   Patient lives at home with  her husband Legrand [place]).   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   Right handed.   decaffeine Green Tea.   Social Determinants of Health   Financial Resource Strain:   . Difficulty of Paying Living Expenses: Not on file  Food Insecurity:   . Worried About Charity fundraiser in the Last Year: Not on file  . Ran Out of Food in the Last Year: Not on file  Transportation Needs:   . Lack of Transportation (Medical): Not on file  . Lack of Transportation (Non-Medical): Not on file  Physical Activity:   . Days of Exercise per Week: Not on file  . Minutes of Exercise per Session: Not on file  Stress:   . Feeling of Stress : Not on file  Social Connections:   . Frequency of Communication with Friends and Family: Not on file  . Frequency of Social Gatherings with Friends and Family: Not on file  . Attends Religious Services: Not on file  . Active Member of Clubs or Organizations: Not on file  . Attends Archivist Meetings: Not on file  . Marital Status: Not on file  Intimate Partner Violence:   . Fear of Current or Ex-Partner: Not on file  . Emotionally Abused: Not on file  . Physically Abused: Not on file  . Sexually Abused: Not on file    Review of Systems  Constitutional: Negative for fatigue and unexpected weight change.  Respiratory:  Negative for chest tightness. Shortness of breath: min with exertion up stairs, similar to past -no new sxs or worsening.    Cardiovascular: Negative for chest pain, palpitations and leg swelling.  Gastrointestinal: Negative for abdominal pain and blood in stool.  Genitourinary: Negative for decreased urine volume, difficulty urinating, dysuria, frequency, hematuria and urgency.  Neurological: Negative for dizziness, syncope, light-headedness and headaches.    Objective:   Vitals:   01/12/20 0819 01/12/20 0832 01/12/20 0911  BP: (!) 164/80 (!) 154/80 (!) 146/84  Pulse: 98    Temp: 98.4 F (36.9 C)    TempSrc: Temporal    SpO2: 98%    Weight: 132 lb 3.2 oz (60 kg)    Height: 5\' 3"  (1.6 m)       Physical Exam Vitals reviewed.  Constitutional:      Appearance: She is well-developed.  HENT:     Head: Normocephalic and atraumatic.  Eyes:     Conjunctiva/sclera: Conjunctivae normal.     Pupils: Pupils are equal, round, and reactive to light.  Neck:     Vascular: No carotid bruit.  Cardiovascular:     Rate and Rhythm: Normal rate and regular rhythm.     Heart sounds: Normal heart sounds.  Pulmonary:     Effort: Pulmonary effort is normal.     Breath sounds: Normal breath sounds.  Abdominal:     Palpations: Abdomen is soft. There is no pulsatile mass.     Tenderness: There is no abdominal tenderness.  Skin:    General: Skin is warm and dry.  Neurological:     Mental Status: She is alert and oriented to person, place, and time.  Psychiatric:        Behavior: Behavior normal.    Results for orders placed or performed in visit on 01/12/20  POCT urinalysis dipstick  Result Value Ref Range   Color, UA yellow yellow   Clarity, UA clear clear  Glucose, UA negative negative mg/dL   Bilirubin, UA negative negative   Ketones, POC UA negative negative mg/dL   Spec Grav, UA >=1.030 (A) 1.010 - 1.025   Blood, UA trace-lysed (A) negative   pH, UA 6.0 5.0 - 8.0   Protein Ur,  POC negative negative mg/dL   Urobilinogen, UA 0.2 0.2 or 1.0 E.U./dL   Nitrite, UA Negative Negative   Leukocytes, UA Negative Negative  no fluids since 6pm yesterday. Drinks during day, less before bedtime.     Assessment & Plan:  YASLIN GETZ is a 69 y.o. female . Foamy urine - Plan: Microalbumin / creatinine urine ratio, POCT urinalysis dipstick  -Concentrated urine but no protein in office.  Check microalbumin, check renal function on electrolytes.  Consider urology eval if persistent or worsening.  Continue to maintain hydration.  Essential hypertension - Plan: Microalbumin / creatinine urine ratio, Comprehensive metabolic panel  -Slightly elevated in office, improved at home.  Questionable whitecoat component.  Nurse visit planned in 2 weeks with home monitor  Chronic obstructive pulmonary disease, unspecified COPD type ([place]) - Plan: Tiotropium Bromide Monohydrate (SPIRIVA RESPIMAT) 2.5 MCG/ACT AERS  -Reports overall stable symptoms, continue Spiriva  Prediabetes - Plan: Microalbumin / creatinine urine ratio, Hemoglobin A1c  -Commended on diet, weight management.  Only borderline A1c previously, repeat today.  Hyperlipidemia, unspecified hyperlipidemia type - Plan: Comprehensive metabolic panel, Lipid panel  -Tolerating current regimen, no changes.  Allergic rhinitis due to pollen, unspecified seasonality - Plan: fluticasone (FLONASE) 50 MCG/ACT nasal spray  -Stable continue Flonase   Meds ordered this encounter  Medications  . Tiotropium Bromide Monohydrate (SPIRIVA RESPIMAT) 2.5 MCG/ACT AERS    Sig: Take 2 puffs by mouth daily.    Dispense:  12 g    Refill:  6  . fluticasone (FLONASE) 50 MCG/ACT nasal spray    Sig: Place 2 sprays into both nostrils daily.    Dispense:  48 g    Refill:  6   Patient Instructions     Nurse visit in next 2 weeks with your machine to make sure it is accurate. Can decide on med changes at that time.   I will check another urine  test, but one in office did not have protein. If increased bubbles in the urine, or not improving I can have you evaluated by urology if needed. Let me know.     How to Take Your Blood Pressure Blood pressure is a measurement of how strongly your blood is pressing against the walls of your arteries. Arteries are blood vessels that carry blood from your heart throughout your body. Your health care provider takes your blood pressure at each office visit. You can also take your own blood pressure at home with a blood pressure machine. You may need to take your own blood pressure:  To confirm a diagnosis of high blood pressure (hypertension).  To monitor your blood pressure over time.  To make sure your blood pressure medicine is working. Supplies needed: To take your blood pressure, you will need a blood pressure machine. You can buy a blood pressure machine, or blood pressure monitor, at most drugstores or online. There are several types of home blood pressure monitors. When choosing one, consider the following:  Choose a monitor that has an arm cuff.  Choose a cuff that wraps snugly around your upper arm. You should be able to fit only one finger between your arm and the cuff.  Do not choose a monitor  that measures your blood pressure from your wrist or finger. Your health care provider can suggest a reliable monitor that will meet your needs. How to prepare To get the most accurate reading, avoid the following for 30 minutes before you check your blood pressure:  Drinking caffeine.  Drinking alcohol.  Eating.  Smoking.  Exercising. Five minutes before you check your blood pressure:  Empty your bladder.  Sit quietly without talking in a dining chair, rather than in a soft couch or armchair. How to take your blood pressure To check your blood pressure, follow the instructions in the manual that came with your blood pressure monitor. If you have a digital blood pressure monitor,  the instructions may be as follows: 1. Sit up straight. 2. Place your feet on the floor. Do not cross your ankles or legs. 3. Rest your left arm at the level of your heart on a table or desk or on the arm of a chair. 4. Pull up your shirt sleeve. 5. Wrap the blood pressure cuff around the upper part of your left arm, 1 inch (2.5 cm) above your elbow. It is best to wrap the cuff around bare skin. 6. Fit the cuff snugly around your arm. You should be able to place only one finger between the cuff and your arm. 7. Position the cord inside the groove of your elbow. 8. Press the power button. 9. Sit quietly while the cuff inflates and deflates. 10. Read the digital reading on the monitor screen and write it down (record it). 11. Wait 2-3 minutes, then repeat the steps, starting at step 1. What does my blood pressure reading mean? A blood pressure reading consists of a higher number over a lower number. Ideally, your blood pressure should be below 120/80. The first ("top") number is called the systolic pressure. It is a measure of the pressure in your arteries as your heart beats. The second ("bottom") number is called the diastolic pressure. It is a measure of the pressure in your arteries as the heart relaxes. Blood pressure is classified into four stages. The following are the stages for adults who do not have a short-term serious illness or a chronic condition. Systolic pressure and diastolic pressure are measured in a unit called mm Hg. Normal  Systolic pressure: below 123456.  Diastolic pressure: below 80. Elevated  Systolic pressure: Q000111Q.  Diastolic pressure: below 80. Hypertension stage 1  Systolic pressure: 0000000.  Diastolic pressure: XX123456. Hypertension stage 2  Systolic pressure: XX123456 or above.  Diastolic pressure: 90 or above. You can have prehypertension or hypertension even if only the systolic or only the diastolic number in your reading is higher than normal. Follow  these instructions at home:  Check your blood pressure as often as recommended by your health care provider.  Take your monitor to the next appointment with your health care provider to make sure: ? That you are using it correctly. ? That it provides accurate readings.  Be sure you understand what your goal blood pressure numbers are.  Tell your health care provider if you are having any side effects from blood pressure medicine. Contact a health care provider if:  Your blood pressure is consistently high. Get help right away if:  Your systolic blood pressure is higher than 180.  Your diastolic blood pressure is higher than 110. This information is not intended to replace advice given to you by your health care provider. Make sure you discuss any questions you have with your  health care provider. Document Revised: 10/26/2017 Document Reviewed: 04/21/2016 Elsevier Patient Education  [employer].   If you have lab work done today you will be contacted with your lab results within the next 2 weeks.  If you have not heard from [place] then please contact us. The fastest way to get your results is to register for My Chart.   IF you received an x-ray today, you will receive an invoice from [facility]. Please contact [facility] at [phone] with questions or concerns regarding your invoice.   IF you received labwork today, you will receive an invoice from [place]. Please contact [facility] at [phone] with questions or concerns regarding your invoice.   Our billing staff will not be able to assist you with questions regarding bills from these companies.  You will be contacted with the lab results as soon as they are available. The fastest way to get your results is to activate your My Chart account. Instructions are located on the last page of this paperwork. If you have not heard from [place] regarding the results in 2 weeks, please contact this office.           Signed, [name], MD [facility]

## 2020-01-13 LAB — COMPREHENSIVE METABOLIC PANEL
ALT: 21 IU/L (ref 0–32)
AST: 24 IU/L (ref 0–40)
Albumin/Globulin Ratio: 1.9 (ref 1.2–2.2)
Albumin: 4.2 g/dL (ref 3.8–4.8)
Alkaline Phosphatase: 80 IU/L (ref 39–117)
BUN/Creatinine Ratio: 22 (ref 12–28)
BUN: 17 mg/dL (ref 8–27)
Bilirubin Total: 0.4 mg/dL (ref 0.0–1.2)
CO2: 24 mmol/L (ref 20–29)
Calcium: 10.2 mg/dL (ref 8.7–10.3)
Chloride: 100 mmol/L (ref 96–106)
Creatinine, Ser: 0.79 mg/dL (ref 0.57–1.00)
GFR calc Af Amer: 88 mL/min/{1.73_m2} (ref >59)
GFR calc non Af Amer: 77 mL/min/{1.73_m2} (ref >59)
Globulin, Total: 2.2 g/dL (ref 1.5–4.5)
Glucose: 104 mg/dL — ABNORMAL HIGH (ref 65–99)
Potassium: 4 mmol/L (ref 3.5–5.2)
Sodium: 142 mmol/L (ref 134–144)
Total Protein: 6.4 g/dL (ref 6.0–8.5)

## 2020-01-13 LAB — MICROALBUMIN / CREATININE URINE RATIO
Creatinine, Urine: 170.6 mg/dL
Microalb/Creat Ratio: 6 mg/g creat (ref 0–29)
Microalbumin, Urine: 9.9 ug/mL

## 2020-01-13 LAB — LIPID PANEL
Chol/HDL Ratio: 3.1 ratio (ref 0.0–4.4)
Cholesterol, Total: 183 mg/dL (ref 100–199)
HDL: 60 mg/dL (ref >39)
LDL Chol Calc (NIH): 102 mg/dL — ABNORMAL HIGH (ref 0–99)
Triglycerides: 117 mg/dL (ref 0–149)
VLDL Cholesterol Cal: 21 mg/dL (ref 5–40)

## 2020-01-13 LAB — HEMOGLOBIN A1C
Est. average glucose Bld gHb Est-mCnc: 111 mg/dL
Hgb A1c MFr Bld: 5.5 % (ref 4.8–5.6)

## 2020-01-15 ENCOUNTER — Ambulatory Visit: Payer: Medicare Other | Admitting: Family Medicine

## 2020-01-22 ENCOUNTER — Ambulatory Visit: Payer: Medicare Other | Admitting: Family Medicine

## 2020-01-27 ENCOUNTER — Other Ambulatory Visit: Payer: Self-pay

## 2020-01-27 ENCOUNTER — Ambulatory Visit (INDEPENDENT_AMBULATORY_CARE_PROVIDER_SITE_OTHER): Payer: Medicare Other | Admitting: Family Medicine

## 2020-01-27 VITALS — BP 169/87

## 2020-01-27 DIAGNOSIS — I10 Essential (primary) hypertension: Secondary | ICD-10-CM

## 2020-01-27 NOTE — Progress Notes (Signed)
Pt came in for bp check on nurse visit. Pt says she was not pleased how bp was read. She feels that I did the reading wrong and that the cuff was not put on properly. She feels this office makes her bp go up due to only having high readings when she is here. She did have a log with her of normal readings. Says she will follow up when she feels the need to. Bp was taken three times, allowing the patient to dictate how she felt the readings should go. Allowed pt to sit quietly for at least 2 min before taking each reading.

## 2020-04-20 DIAGNOSIS — H6123 Impacted cerumen, bilateral: Secondary | ICD-10-CM | POA: Diagnosis not present

## 2020-06-10 DIAGNOSIS — H2513 Age-related nuclear cataract, bilateral: Secondary | ICD-10-CM | POA: Diagnosis not present

## 2020-06-10 DIAGNOSIS — H43813 Vitreous degeneration, bilateral: Secondary | ICD-10-CM | POA: Diagnosis not present

## 2020-06-10 DIAGNOSIS — H401131 Primary open-angle glaucoma, bilateral, mild stage: Secondary | ICD-10-CM | POA: Diagnosis not present

## 2020-06-10 DIAGNOSIS — H18413 Arcus senilis, bilateral: Secondary | ICD-10-CM | POA: Diagnosis not present

## 2020-06-15 ENCOUNTER — Other Ambulatory Visit: Payer: Self-pay | Admitting: Family Medicine

## 2020-06-15 DIAGNOSIS — Z1231 Encounter for screening mammogram for malignant neoplasm of breast: Secondary | ICD-10-CM

## 2020-06-23 ENCOUNTER — Other Ambulatory Visit: Payer: Self-pay | Admitting: Family Medicine

## 2020-06-23 DIAGNOSIS — I10 Essential (primary) hypertension: Secondary | ICD-10-CM

## 2020-06-23 NOTE — Telephone Encounter (Signed)
Requested Prescriptions  Pending Prescriptions Disp Refills  . lisinopril-hydrochlorothiazide (ZESTORETIC) 20-12.5 MG tablet [Pharmacy Med Name: LISINOPRIL/HYDROCHLOROTHIAZIDE TABS 20/12.5MG ] 90 tablet 0    Sig: TAKE 1 TABLET DAILY     Cardiovascular:  ACEI + Diuretic Combos Failed - 06/23/2020  3:08 AM      Failed - Last BP in normal range    BP Readings from Last 1 Encounters:  01/27/20 (!) 169/87         Passed - Na in normal range and within 180 days    Sodium  Date Value Ref Range Status  01/12/2020 142 134 - 144 mmol/L Final         Passed - K in normal range and within 180 days    Potassium  Date Value Ref Range Status  01/12/2020 4.0 3.5 - 5.2 mmol/L Final         Passed - Cr in normal range and within 180 days    Creat  Date Value Ref Range Status  08/31/2016 0.90 0.50 - 0.99 mg/dL Final    Comment:      For patients > or = 70 years of age: The upper reference limit for Creatinine is approximately 13% higher for people identified as African-American.      Creatinine, Ser  Date Value Ref Range Status  01/12/2020 0.79 0.57 - 1.00 mg/dL Final         Passed - Ca in normal range and within 180 days    Calcium  Date Value Ref Range Status  01/12/2020 10.2 8.7 - 10.3 mg/dL Final         Passed - Patient is not pregnant      Passed - Valid encounter within last 6 months    Recent Outpatient Visits          4 months ago Essential hypertension   [facility], Lilia Argue, MD   5 months ago Foamy urine   [facility], Ranell Patrick, MD   7 months ago Medicare annual wellness visit, subsequent   [facility], Ranell Patrick, MD   9 months ago Essential hypertension   [facility], Ranell Patrick, MD   11 months ago Essential hypertension   [facility], Ranell Patrick, MD      Future Appointments            In 3 weeks [name] Ranell Patrick, MD [facility]

## 2020-07-14 ENCOUNTER — Encounter: Payer: Self-pay | Admitting: Family Medicine

## 2020-07-14 ENCOUNTER — Ambulatory Visit (INDEPENDENT_AMBULATORY_CARE_PROVIDER_SITE_OTHER): Payer: Medicare Other | Admitting: Family Medicine

## 2020-07-14 ENCOUNTER — Other Ambulatory Visit: Payer: Self-pay

## 2020-07-14 VITALS — BP 175/89

## 2020-07-14 VITALS — BP 154/86 | HR 93 | Temp 97.9°F | Ht 63.0 in | Wt 137.0 lb

## 2020-07-14 DIAGNOSIS — R7303 Prediabetes: Secondary | ICD-10-CM | POA: Diagnosis not present

## 2020-07-14 DIAGNOSIS — I10 Essential (primary) hypertension: Secondary | ICD-10-CM | POA: Diagnosis not present

## 2020-07-14 DIAGNOSIS — E785 Hyperlipidemia, unspecified: Secondary | ICD-10-CM

## 2020-07-14 DIAGNOSIS — R82998 Other abnormal findings in urine: Secondary | ICD-10-CM

## 2020-07-14 LAB — POCT URINALYSIS DIP (MANUAL ENTRY)
Bilirubin, UA: NEGATIVE
Blood, UA: NEGATIVE
Glucose, UA: NEGATIVE mg/dL
Ketones, POC UA: NEGATIVE mg/dL
Leukocytes, UA: NEGATIVE
Nitrite, UA: NEGATIVE
Protein Ur, POC: NEGATIVE mg/dL
Spec Grav, UA: 1.02 (ref 1.010–1.025)
Urobilinogen, UA: 0.2 E.U./dL
pH, UA: 6 (ref 5.0–8.0)

## 2020-07-14 LAB — COMPREHENSIVE METABOLIC PANEL
ALT: 22 IU/L (ref 0–32)
AST: 21 IU/L (ref 0–40)
Albumin/Globulin Ratio: 2 (ref 1.2–2.2)
Albumin: 4.7 g/dL (ref 3.8–4.8)
Alkaline Phosphatase: 83 IU/L (ref 48–121)
BUN/Creatinine Ratio: 19 (ref 12–28)
BUN: 15 mg/dL (ref 8–27)
Bilirubin Total: 0.5 mg/dL (ref 0.0–1.2)
CO2: 25 mmol/L (ref 20–29)
Calcium: 10.1 mg/dL (ref 8.7–10.3)
Chloride: 101 mmol/L (ref 96–106)
Creatinine, Ser: 0.81 mg/dL (ref 0.57–1.00)
GFR calc Af Amer: 85 mL/min/{1.73_m2} (ref >59)
GFR calc non Af Amer: 74 mL/min/{1.73_m2} (ref >59)
Globulin, Total: 2.3 g/dL (ref 1.5–4.5)
Glucose: 113 mg/dL — ABNORMAL HIGH (ref 65–99)
Potassium: 3.9 mmol/L (ref 3.5–5.2)
Sodium: 139 mmol/L (ref 134–144)
Total Protein: 7 g/dL (ref 6.0–8.5)

## 2020-07-14 LAB — LIPID PANEL
Chol/HDL Ratio: 3.5 ratio (ref 0.0–4.4)
Cholesterol, Total: 214 mg/dL — ABNORMAL HIGH (ref 100–199)
HDL: 61 mg/dL (ref >39)
LDL Chol Calc (NIH): 126 mg/dL — ABNORMAL HIGH (ref 0–99)
Triglycerides: 152 mg/dL — ABNORMAL HIGH (ref 0–149)
VLDL Cholesterol Cal: 27 mg/dL (ref 5–40)

## 2020-07-14 LAB — HEMOGLOBIN A1C
Est. average glucose Bld gHb Est-mCnc: 111 mg/dL
Hgb A1c MFr Bld: 5.5 % (ref 4.8–5.6)

## 2020-07-14 LAB — POC MICROSCOPIC URINALYSIS (UMFC): Mucus: ABSENT

## 2020-07-14 MED ORDER — LIVALO 1 MG PO TABS: 1.0000 mg | ORAL_TABLET | Freq: Every day | ORAL | 3 refills | Status: DC

## 2020-07-14 MED ORDER — EZETIMIBE 10 MG PO TABS: 10.0000 mg | ORAL_TABLET | Freq: Every day | ORAL | 3 refills | Status: DC

## 2020-07-14 MED ORDER — LISINOPRIL-HYDROCHLOROTHIAZIDE 20-12.5 MG PO TABS: 1.0000 | ORAL_TABLET | Freq: Every day | ORAL | 2 refills | Status: DC

## 2020-07-14 NOTE — Patient Instructions (Addendum)
  Try nurse visit again today to compare your meter readings to make sure they are accurate. No changes for now.    If you have lab work done today you will be contacted with your lab results within the next 2 weeks.  If you have not heard from [place] then please contact us. The fastest way to get your results is to register for My Chart.   IF you received an x-ray today, you will receive an invoice from [facility]. Please contact [facility] at [phone] with questions or concerns regarding your invoice.   IF you received labwork today, you will receive an invoice from [place]. Please contact [facility] at [phone] with questions or concerns regarding your invoice.   Our billing staff will not be able to assist you with questions regarding bills from these companies.  You will be contacted with the lab results as soon as they are available. The fastest way to get your results is to activate your My Chart account. Instructions are located on the last page of this paperwork. If you have not heard from [place] regarding the results in 2 weeks, please contact this office.

## 2020-07-14 NOTE — Patient Instructions (Addendum)
° ° ° °  If you have lab work done today you will be contacted with your lab results within the next 2 weeks.  If you have not heard from us then please contact us. The fastest way to get your results is to register for My Chart. ° ° °IF you received an x-ray today, you will receive an invoice from [facility]. Please contact [facility] at [phone] with questions or concerns regarding your invoice.  ° °IF you received labwork today, you will receive an invoice from [facility]. Please contact [facility] at [phone] with questions or concerns regarding your invoice.  ° °Our billing staff will not be able to assist you with questions regarding bills from these companies. ° °You will be contacted with the lab results as soon as they are available. The fastest way to get your results is to activate your My Chart account. Instructions are located on the last page of this paperwork. If you have not heard from us regarding the results in 2 weeks, please contact this office. °  ° ° ° °

## 2020-07-14 NOTE — Progress Notes (Signed)
Pt here to compare home monitor to the in office cuff. Pt monitor was deemed accurate within 10 points

## 2020-07-14 NOTE — Progress Notes (Signed)
Subjective:  Patient ID: Breanna Sharp, female    DOB: 11-27-1951  Age: 69 y.o. MRN: [id number]  CC:  Chief Complaint  Patient presents with  . Follow-up    on hypertension,hyperlipidemia, and prediabetes. Pt reports no issues with BP at home. pt chackes BP 1-2x per week. pt says her BP at home runs 120/75. Pt reports no physical symptoms of hypertensopn or hyperlipidemia. pt reports she still has the Foamy urine from last OV, but it isn't as bad. Pt also reports her R  hip has been bothering her more recently than usual.    HPI SHEVELLE SMITHER presents for   Hypertension: Borderline elevations last visit, recommended checking home meter for accuracy. Possible whitecoat component. Elevated on nurse visit March 2, unknown if normal readings at home - no meter readings. . Continued on lisinopril HCT 20/12.5 mg daily. Already has some urinary frequency, does not want to increase HCTZ. No dysuria/hematuria.  Home readings: 120-145/75-80.  If elevated, repeats and normal.  BP Readings from Last 3 Encounters:  07/14/20 (!) 174/79  01/27/20 (!) 169/87  01/12/20 (!) 146/84   Lab Results  Component Value Date   CREATININE 0.79 01/12/2020   Hyperlipidemia: Treated with Livalo 1 mg daily, Zetia 10 mg daily. No new myalgias, side effects.  Lab Results  Component Value Date   CHOL 183 01/12/2020   HDL 60 01/12/2020   LDLCALC 102 (H) 01/12/2020   TRIG 117 01/12/2020   CHOLHDL 3.1 01/12/2020   Lab Results  Component Value Date   ALT 21 01/12/2020   AST 24 01/12/2020   ALKPHOS 80 01/12/2020   BILITOT 0.4 01/12/2020   Prediabetes A1c 5.7 in August 2020, normalized in February.  Some less walking, diet more difficult with pandemic. Some sweets, no sodas.  Wt Readings from Last 3 Encounters:  07/14/20 137 lb (62.1 kg)  01/12/20 132 lb 3.2 oz (60 kg)  11/13/19 133 lb (60.3 kg)   Lab Results  Component Value Date   HGBA1C 5.5 01/12/2020  Normal microalbumin creatinine ratio in  February. She is reported foamy urine in the past, less recently. Protein was negative on her last urinalysis Lab Results  Component Value Date   CREATININE 0.79 01/12/2020    History Patient Active Problem List   Diagnosis Date Noted  . Gastritis and gastroduodenitis   . Gastroesophageal reflux disease without esophagitis   . Esophageal dysphagia   . Globus sensation   . H/O abdominal hysterectomy 06/04/2019  . Vaginal discharge 06/04/2019  . History of cervical cancer 06/04/2019  . Vaginal atrophy 06/04/2019  . Labial lesion 06/04/2019  . Epidemic vertigo 01/30/2018  . COPD, group A, by GOLD 2017 classification ([place]) 06/06/2017  . Dyslipidemia 05/11/2016  . Shortness of breath 05/11/2016  . Essential hypertension 05/11/2016  . Osteopenia 10/12/2015  . DOE (dyspnea on exertion) 05/02/2013  . Chronic sinusitis 05/02/2013  . Vertigo 08/14/2012  . Benign essential HTN 08/14/2012  . BMI 28.0-28.9,adult 08/14/2012  . COLONIC POLYPS 01/24/2008  . ACUT GASTR ULCER W/O MENTION HEMORR PERF/OBST 01/24/2008   Past Medical History:  Diagnosis Date  . Allergy   . Anxiety   . Arthritis   . Cancer ([place])   . Dyslipidemia    statin intolerance  . GERD (gastroesophageal reflux disease)   . Heart murmur   . Hyperlipidemia   . Hypertension   . Vertigo    Past Surgical History:  Procedure Laterality Date  . [place] STUDY N/A 09/29/2019  Procedure: [place];  Surgeon: Mauri Pole, MD;  Location: [facility];  Service: Endoscopy;  Laterality: N/A;  . ABDOMINAL HYSTERECTOMY  1972   cervical cancer  . BIOPSY  11/24/2019   Procedure: BIOPSY;  Surgeon: Mauri Pole, MD;  Location: [facility];  Service: Endoscopy;;  . BRAVO [place] STUDY N/A 11/24/2019   Procedure: BRAVO [place];  Surgeon: Mauri Pole, MD;  Location: [facility];  Service: Endoscopy;  Laterality: N/A;  . COLONOSCOPY    . ESOPHAGEAL MANOMETRY N/A 09/29/2019   Procedure: ESOPHAGEAL  MANOMETRY (EM);  Surgeon: Mauri Pole, MD;  Location: [facility];  Service: Endoscopy;  Laterality: N/A;  . ESOPHAGOGASTRODUODENOSCOPY (EGD) WITH PROPOFOL N/A 11/24/2019   Procedure: ESOPHAGOGASTRODUODENOSCOPY (EGD) WITH PROPOFOL;  Surgeon: Mauri Pole, MD;  Location: [facility];  Service: Endoscopy;  Laterality: N/A;  . WISDOM TOOTH EXTRACTION     uppers   Allergies  Allergen Reactions  . Statins     Muscle cramps   Prior to Admission medications   Medication Sig Start Date End Date Taking? Authorizing Provider  acetaminophen (TYLENOL) 650 MG CR tablet Take 1,300 mg by mouth every 8 (eight) hours as needed for pain.   Yes [provider]  [place] Oil-Levomenthol (FDGARD PO) Take by mouth.   Yes [provider]  Carboxymethylcellul-Glycerin (LUBRICATING EYE DROPS OP) Place 2 drops into both eyes daily.   Yes [provider]  ezetimibe (ZETIA) 10 MG tablet Take 1 tablet (10 mg total) by mouth daily. 08/28/19  Yes Hilty, Nadean Corwin, MD  fexofenadine (ALLEGRA) 180 MG tablet Take 180 mg by mouth daily.  10/04/13  Yes [provider]  FIBER PO Take by mouth.   Yes [provider]  fluticasone (FLONASE) 50 MCG/ACT nasal spray Place 2 sprays into both nostrils daily. 01/12/20  Yes Wendie Agreste, MD  Inulin (FIBER CHOICE PO) Take 2 capsules by mouth daily after supper.   Yes [provider]  lisinopril-hydrochlorothiazide (ZESTORETIC) 20-12.5 MG tablet TAKE 1 TABLET DAILY 06/23/20  Yes Wendie Agreste, MD  Multiple Vitamin (MULTIVITAMIN WITH MINERALS) TABS tablet Take 1 tablet by mouth daily.   Yes [provider]  Pitavastatin Calcium (LIVALO) 1 MG TABS Take 1 tablet (1 mg total) by mouth daily. 08/28/19  Yes Hilty, Nadean Corwin, MD  Tiotropium Bromide Monohydrate (SPIRIVA RESPIMAT) 2.5 MCG/ACT AERS Take 2 puffs by mouth daily. 01/12/20  Yes Wendie Agreste, MD  Travoprost, BAK Free, (TRAVATAN) 0.004 % SOLN ophthalmic  solution Place 1 drop into both eyes at bedtime.   Yes [provider]  Vitamin D, Ergocalciferol, 50 MCG (2000 UT) CAPS Take by mouth.   Yes [provider]  esomeprazole (NEXIUM) 40 MG capsule Take 1 capsule (40 mg total) by mouth daily before breakfast. 01/06/20 04/05/20  Mauri Pole, MD   Social History   Socioeconomic History  . Marital status: Married    Spouse name: [name]  . Number of children: 0  . Years of education: 68  . Highest education level: Not on file  Occupational History    Employer: RETIRED    Comment: Retired  Tobacco Use  . Smoking status: Former Smoker    Packs/day: 0.50    Years: 32.00    Pack years: 16.00    Types: Cigarettes    Quit date: 11/27/2002    Years since quitting: 17.6  . Smokeless tobacco: Never Used  Vaping Use  . Vaping Use: Never used  Substance and Sexual Activity  . Alcohol use: Yes    Alcohol/week: 3.0 standard drinks    Types: 3 Standard drinks or equivalent per week    Comment: social - 2 or 3 times a month 1-2 glasses of wine  . Drug use: No  . Sexual activity: Yes    Partners: Male  Other Topics Concern  . Not on file  Social History Narrative   Exercise 3 to 4 times/week walking for 45 min -1 hour   Patient lives at home with her husband Legrand [place]).   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   Right handed.   decaffeine Green Tea.   Social Determinants of Health   Financial Resource Strain:   . Difficulty of Paying Living Expenses:   Food Insecurity:   . Worried About Charity fundraiser in the Last Year:   . Arboriculturist in the Last Year:   Transportation Needs:   . Film/video editor (Medical):   [name] Lack of Transportation (Non-Medical):   Physical Activity:   . Days of Exercise per Week:   . Minutes of Exercise per Session:   Stress:   . Feeling of Stress :   Social Connections:   . Frequency  of Communication with Friends and Family:   . Frequency of Social Gatherings with Friends and Family:   . Attends Religious Services:   . Active Member of Clubs or Organizations:   . Attends Archivist Meetings:   [name] Marital Status:   Intimate Partner Violence:   . Fear of Current or Ex-Partner:   . Emotionally Abused:   [name] Physically Abused:   . Sexually Abused:     Review of Systems  Constitutional: Negative for fatigue and unexpected weight change.  Respiratory: Negative for chest tightness and shortness of breath.   Cardiovascular: Negative for chest pain, palpitations and leg swelling.  Gastrointestinal: Negative for abdominal pain and blood in stool.  Genitourinary: Positive for frequency. Negative for difficulty urinating, dysuria and hematuria.  Neurological: Negative for dizziness, syncope, light-headedness and headaches.     Objective:   Vitals:   07/14/20 0809  BP: (!) 174/79  Pulse: 93  Temp: 97.9 F (36.6 C)  TempSrc: Temporal  SpO2: 97%  Weight: 137 lb (62.1 kg)  Height: 5\' 3"  (1.6 m)     Physical Exam Vitals reviewed.  Constitutional:      Appearance: She is well-developed.  HENT:     Head: Normocephalic and atraumatic.  Eyes:     Conjunctiva/sclera: Conjunctivae normal.     Pupils: Pupils are equal, round, and reactive to light.  Neck:     Vascular: No carotid bruit.  Cardiovascular:     Rate and Rhythm: Normal rate and regular rhythm.     Heart sounds: Normal heart sounds.  Pulmonary:     Effort: Pulmonary effort is normal.     Breath sounds: Normal breath sounds.  Abdominal:     Palpations: Abdomen is soft. There is no pulsatile mass.     Tenderness: There is no abdominal tenderness.  Skin:    General: Skin is warm and dry.  Neurological:     Mental Status: She is alert and oriented to person, place, and time.  Psychiatric:        Behavior: Behavior normal.      Results for orders placed or performed in visit on 07/14/20  POCT urinalysis dipstick  Result Value Ref Range   Color, UA yellow yellow   Clarity, UA clear clear   Glucose, UA negative negative mg/dL   Bilirubin, UA negative negative   Ketones, POC UA negative negative mg/dL   Spec Grav, UA 1.020 1.010 - 1.025   Blood, UA negative negative   pH, UA 6.0 5.0 - 8.0   Protein Ur, POC negative negative mg/dL   Urobilinogen, UA 0.2 0.2 or 1.0 E.U./dL   Nitrite, UA Negative Negative   Leukocytes, UA Negative Negative  POCT Microscopic Urinalysis ([facility])  Result Value Ref Range   WBC,UR,HPF,POC None None WBC/hpf   RBC,UR,HPF,POC None None RBC/hpf   Bacteria None None, Too numerous to count   Mucus Absent Absent   Epithelial Cells, UR Per Microscopy Few (A) None, Too numerous to count cells/hpf     Assessment & Plan:  AKASIA AHMAD is a 69 y.o. female . Essential hypertension - Plan: Lipid panel, Comprehensive metabolic panel, lisinopril-hydrochlorothiazide (ZESTORETIC) 20-12.5 MG tablet  -Better readings at home, suspect whitecoat component, but nurse visit today to check accuracy of meter. Prediabetes - Plan: Hemoglobin A1c  -Plans to watch diet, exercise as tolerated.  Check A1c  Hyperlipidemia, unspecified hyperlipidemia type  -Tolerating current regimen, continue same.  Foamy urine - Plan: POCT urinalysis dipstick, POCT Microscopic Urinalysis ([facility])  -Less symptoms, previous testing for protein reassuring, repeat test in office today.  RTC precautions of other symptoms.  If continued urinary frequency, could try off HCTZ.  Meds ordered this encounter  Medications  . lisinopril-hydrochlorothiazide (ZESTORETIC) 20-12.5 MG tablet    Sig: Take 1 tablet by mouth daily.    Dispense:  90 tablet    Refill:  2   Patient Instructions    Try nurse visit again today to compare your meter readings to make sure they are accurate. No changes for now.    If you have lab work done today you will be contacted with your lab results within the  next 2 weeks.  If you have not heard from [place] then please contact us. The fastest way to get your results is to register for My Chart.   IF you received an x-ray today, you will receive an invoice from [facility]. Please contact [facility] at [phone] with questions or concerns regarding your invoice.   IF you received labwork today, you will receive an invoice from [place]. Please contact [facility] at [phone] with questions or concerns regarding your invoice.   Our billing staff will not be able to assist you with questions regarding bills from these companies.  You will be contacted with the lab results as soon as they are available. The fastest way to get your results is to activate your My Chart account. Instructions are located on the last page of this paperwork. If you have not heard from [place] regarding the results in 2 weeks, please contact this office.         Signed, [name], MD [facility]

## 2020-08-23 ENCOUNTER — Ambulatory Visit (INDEPENDENT_AMBULATORY_CARE_PROVIDER_SITE_OTHER): Payer: Medicare Other | Admitting: Registered Nurse

## 2020-08-23 ENCOUNTER — Other Ambulatory Visit: Payer: Self-pay

## 2020-08-23 DIAGNOSIS — Z23 Encounter for immunization: Secondary | ICD-10-CM

## 2020-08-23 NOTE — Patient Instructions (Signed)
° ° ° °  If you have lab work done today you will be contacted with your lab results within the next 2 weeks.  If you have not heard from us then please contact us. The fastest way to get your results is to register for My Chart. ° ° °IF you received an x-ray today, you will receive an invoice from [facility]. Please contact [facility] at [phone] with questions or concerns regarding your invoice.  ° °IF you received labwork today, you will receive an invoice from [facility]. Please contact [facility] at [phone] with questions or concerns regarding your invoice.  ° °Our billing staff will not be able to assist you with questions regarding bills from these companies. ° °You will be contacted with the lab results as soon as they are available. The fastest way to get your results is to activate your My Chart account. Instructions are located on the last page of this paperwork. If you have not heard from us regarding the results in 2 weeks, please contact this office. °  ° ° ° °

## 2020-09-02 DIAGNOSIS — Z6823 Body mass index (BMI) 23.0-23.9, adult: Secondary | ICD-10-CM | POA: Diagnosis not present

## 2020-09-02 DIAGNOSIS — Z779 Other contact with and (suspected) exposures hazardous to health: Secondary | ICD-10-CM | POA: Diagnosis not present

## 2020-09-02 DIAGNOSIS — R87610 Atypical squamous cells of undetermined significance on cytologic smear of cervix (ASC-US): Secondary | ICD-10-CM | POA: Diagnosis not present

## 2020-09-02 DIAGNOSIS — C55 Malignant neoplasm of uterus, part unspecified: Secondary | ICD-10-CM | POA: Diagnosis not present

## 2020-09-08 ENCOUNTER — Other Ambulatory Visit: Payer: Self-pay

## 2020-09-08 ENCOUNTER — Ambulatory Visit
Admission: RE | Admit: 2020-09-08 | Discharge: 2020-09-08 | Disposition: A | Discharge status: Home / Self Care | Payer: Medicare Other | Source: Ambulatory Visit | Attending: Family Medicine | Admitting: Family Medicine

## 2020-09-08 DIAGNOSIS — Z1231 Encounter for screening mammogram for malignant neoplasm of breast: Secondary | ICD-10-CM | POA: Diagnosis not present

## 2020-09-13 ENCOUNTER — Other Ambulatory Visit: Payer: Self-pay

## 2020-09-13 ENCOUNTER — Encounter: Payer: Self-pay | Admitting: Internal Medicine

## 2020-09-13 ENCOUNTER — Ambulatory Visit (INDEPENDENT_AMBULATORY_CARE_PROVIDER_SITE_OTHER): Payer: Medicare Other | Admitting: Internal Medicine

## 2020-09-13 VITALS — BP 150/81 | HR 81 | Ht 63.5 in | Wt 137.4 lb

## 2020-09-13 DIAGNOSIS — F17200 Nicotine dependence, unspecified, uncomplicated: Secondary | ICD-10-CM | POA: Diagnosis not present

## 2020-09-13 DIAGNOSIS — Z87891 Personal history of nicotine dependence: Secondary | ICD-10-CM

## 2020-09-13 DIAGNOSIS — E785 Hyperlipidemia, unspecified: Secondary | ICD-10-CM

## 2020-09-13 DIAGNOSIS — I10 Essential (primary) hypertension: Secondary | ICD-10-CM | POA: Diagnosis not present

## 2020-09-13 MED ORDER — LIVALO 2 MG PO TABS: 1.0000 | ORAL_TABLET | Freq: Every day | ORAL | 3 refills | Status: DC

## 2020-09-13 NOTE — Progress Notes (Signed)
OFFICE NOTE  Chief Complaint:  Annual follow-up  Primary Care Physician: Wendie Agreste, MD  HPI:  Breanna Sharp is a pleasant 69 year old female with a history of hypertension and dyslipidemia. Unfortunately she's been intolerant to statins in the past and has failed both Lipitor Zocor Crestor and pravastatin. She is reported some increasing shortness of breath mostly when walking upstairs, however has been struggling with recurrent sinusitis and upper respiratory symptoms. Based on her risk factors, recommended metabolic testing. She underwent cardiopulmonary testing on 04/22/2013. She had maximal effort of 1.08 our ER. Peak VO2 was 108% predicted. Heart rate was 94% predicted. Her heart rate in view to curves were essentially normal with late flattening of her view to curve. Overall the study is low risk. She also went underwent palmar he function testing which showed normal diffusion, volume and flow loops. At her last visit she started taking Livalo samples due to an abnormal lipid profile. Her total cholesterol was 213, triglycerides 122, HDL 55 and LDL 134. Over the intial month on Livalo, she seemed to tolerate it.   Then she reported some right flank pain, especially when lifting weights, which he had started to do at the same time. She stopped her cholesterol medicine and stopped her exercise and her symptoms improved. She then restarted her exercise and had symptoms and realized that it was not likely the cholesterol medicine that was causing her right side pain. Her repeat cholesterol profile was abnormally high on only Zetia monotherapy, as she discontinued Livalo due to cramps. Her LDL particle number was 3189, LDL content was 190, HDL C. was 60 and triglycerides 148.    Breanna Sharp returns today for followup of her lipid profile. Her lipid profile in April was markedly improved with an LDL particle #1594, LDL content 105, HDL 51 and triglycerides 142. She reports some  improvement in her shortness of breath and has managed to work on exercise and has had 12 pounds of weight loss.  I saw Ms. Sharp back in the office today. Overall she is doing well except she still has some shortness of breath despite weight loss, particularly when walking up inclines. We will not been able to really establish the reason for this. Heart rate did go up to a very high level with exercise, 94% predicted on cardiopulmonary exercise testing in 2014. Is not clear whether this is an arrhythmia or just sinus tachycardia. This could explain why she feels worse when walking up hills. I like to see if we can re-create this with exercise treadmill stress testing. If there is a sharp increase in heart rate with exercise, she may benefit from a low-dose beta blocker.  05/11/2016  Breanna Sharp returns today for follow-up. She is without any significant complaints. Blood pressure was mildly elevated at 133/87 however repeat was 128/78. She is not having any problems on that area and Livalo. She is due for repeat cholesterol test. After an extensive workup we cannot find a cause from a cardiac standpoint of her shortness of breath. She was referred to [facility] and saw Dr. Vaughan Browner who performed palmar function test and feels that she might have some COPD. She's been on Spiriva and reports improvement in her shortness of breath.  06/06/2017  Breanna Sharp was seen today in follow-up. She is overall without complaints. Denies any shortness of breath or chest pain. She reports her COPD is improved significantly. She's managed to lose some weight and exercising regularly. Blood pressure is  reasonable today 134/72.  08/27/2018  Breanna Sharp returns today for follow-up.  She is done extremely well over the past year.  Her shortness of breath has been improved on inhalers.  Her COPD is stable.  Weight is fairly stable.  She exercises regularly walking between 4 and 8 miles a day.  She denies  any chest pain.  She is also changed her eating habits and recently had a repeat lipid profile which is the best that she is ever seen.  Her total cholesterol is 167, triglycerides 107, HDL 57 LDL 89.  08/28/2019  Breanna Sharp seen today for annual follow-up.  Overall she continues to do well.  She is had no worsening COPD.  She does get a little congestion which is seasonal.  She continues to walk 5 to 7 miles almost every day.  She denies any claudication.  She denies any worsening shortness of breath or chest pain.  Her lipids have been well controlled and LDL is a little higher at 103 but stable compared to study 7 months ago.  She was much better controlled about a year ago with LDL at 89.  She is on both Livalo and ezetimibe.  09/13/2020  Breanna Sharp is seen today for routine follow-up.  She reports her breathing is fairly stable with regards to COPD.  She continues to walk and is asymptomatic.  Her cholesterol still remains higher than target.  She is on ezetimibe and Livalo 1 mg.  She had previously been on the 4 mg dose with some side effects.  Her LDL C was 126 as of August 2021.  I would like to see her below 100.  EKG shows normal sinus rhythm.  PMHx:  Past Medical History:  Diagnosis Date  . Allergy   . Anxiety   . Arthritis   . Cancer ([place])   . Dyslipidemia    statin intolerance  . GERD (gastroesophageal reflux disease)   . Heart murmur   . Hyperlipidemia   . Hypertension   . Vertigo     Past Surgical History:  Procedure Laterality Date  . [place] STUDY N/A 09/29/2019   Procedure: [place] STUDY;  Surgeon: Mauri Pole, MD;  Location: [facility];  Service: Endoscopy;  Laterality: N/A;  . ABDOMINAL HYSTERECTOMY  1972   cervical cancer  . BIOPSY  11/24/2019   Procedure: BIOPSY;  Surgeon: Mauri Pole, MD;  Location: [facility];  Service: Endoscopy;;  . BRAVO [place] STUDY N/A 11/24/2019   Procedure: BRAVO [place];  Surgeon: Mauri Pole, MD;   Location: [facility];  Service: Endoscopy;  Laterality: N/A;  . COLONOSCOPY    . ESOPHAGEAL MANOMETRY N/A 09/29/2019   Procedure: ESOPHAGEAL MANOMETRY (EM);  Surgeon: Mauri Pole, MD;  Location: [facility];  Service: Endoscopy;  Laterality: N/A;  . ESOPHAGOGASTRODUODENOSCOPY (EGD) WITH PROPOFOL N/A 11/24/2019   Procedure: ESOPHAGOGASTRODUODENOSCOPY (EGD) WITH PROPOFOL;  Surgeon: Mauri Pole, MD;  [facility];  Service: Endoscopy;  Laterality: N/A;  . WISDOM TOOTH EXTRACTION     uppers    FAMHx:  Family History  Problem Relation Age of Onset  . Hypertension Mother        dx in her 54's  . Heart attack Mother 45  . Breast cancer Mother        unsure of age  . Heart attack Brother 46  . Diabetes Brother   . Hyperlipidemia Brother   . Hypertension Brother   . Heart disease  Brother   . Colon cancer Neg Hx   . Esophageal cancer Neg Hx   . Rectal cancer Neg Hx   . Stomach cancer Neg Hx     SOCHx:   reports that she quit smoking about 17 years ago. Her smoking use included cigarettes. She has a 16.00 pack-year smoking history. She has never used smokeless tobacco. She reports current alcohol use of about 3.0 standard drinks of alcohol per week. She reports that she does not use drugs.  ALLERGIES:  Allergies  Allergen Reactions  . Statins     Muscle cramps    ROS: Pertinent items noted in HPI and remainder of comprehensive ROS otherwise negative.  HOME MEDS: Current Outpatient Medications  Medication Sig Dispense Refill  . acetaminophen (TYLENOL) 650 MG CR tablet Take 1,300 mg by mouth every 8 (eight) hours as needed for pain.    [name] CARAWAY OIL-LEVOMENTHOL PO Take by mouth.    . Carboxymethylcellul-Glycerin (LUBRICATING EYE DROPS OP) Place 2 drops into both eyes daily.    [name] esomeprazole (NEXIUM) 40 MG capsule Take 1 capsule (40 mg total) by mouth daily before breakfast. 90 capsule 3  . ezetimibe (ZETIA) 10 MG tablet Take 1 tablet (10 mg total) by  mouth daily. 90 tablet 3  . fexofenadine (ALLEGRA) 180 MG tablet Take 180 mg by mouth daily.     [name] FIBER PO Take by mouth.    . fluticasone (FLONASE) 50 MCG/ACT nasal spray Place 2 sprays into both nostrils daily. 48 g 6  . Inulin (FIBER CHOICE PO) Take 2 capsules by mouth daily after supper.    [name] lisinopril-hydrochlorothiazide (ZESTORETIC) 20-12.5 MG tablet Take 1 tablet by mouth daily. 90 tablet 2  . Multiple Vitamin (MULTIVITAMIN WITH MINERALS) TABS tablet Take 1 tablet by mouth daily.    . Pitavastatin Calcium (LIVALO) 1 MG TABS Take 1 tablet (1 mg total) by mouth daily. 90 tablet 3  . Tiotropium Bromide Monohydrate (SPIRIVA RESPIMAT) 2.5 MCG/ACT AERS Take 2 puffs by mouth daily. 12 g 6  . Travoprost, BAK Free, (TRAVATAN) 0.004 % SOLN ophthalmic solution Place 1 drop into both eyes at bedtime.    . Vitamin D, Ergocalciferol, 50 MCG (2000 UT) CAPS Take by mouth.     No current facility-administered medications for this visit.    LABS/IMAGING: No results found for this or any previous visit (from the past 48 hour(s)). No results found.  VITALS: BP (!) 150/81   Pulse 81   Ht 5' 3.5" (1.613 m)   Wt 137 lb 6.4 oz (62.3 kg)   SpO2 100%   BMI 23.96 kg/m   EXAM: General appearance: alert and no distress Neck: no carotid bruit and no JVD Lungs: clear to auscultation bilaterally Heart: regular rate and rhythm, S1, S2 normal, no murmur, click, rub or gallop Abdomen: soft, non-tender; bowel sounds normal; no masses,  no organomegaly Extremities: extremities normal, atraumatic, no cyanosis or edema Pulses: 2+ and symmetric Skin: Skin color, texture, turgor normal. No rashes or lesions Neurologic: GroPsych: Pleasanty normal Psych: Pleasant  EKG: Normal sinus rhythm at 81-personally reviewed  ASSESSMENT: 1. Dyslipidemia, goal LDL less than 100 2.   Hypertension 3.   COPD - Gold A  PLAN: 1.   Mrs. Trussell seems to be stable without any worsening dyspnea.  Blood pressure was  elevated today however at home her list of blood pressures were very well controlled between 237 and 628 systolic.  Cholesterol is a little higher than ideal and I  advised her to increase her Livalo to 2 mg daily.  We will plan a repeat lipid in about 3 months.  Her COPD seems to be stable with infrequent use of rescue inhaler.  Plan follow-up annually or sooner as necessary.   Pixie Casino, MD, [facility], [place] Director of the [facility] Lipid Disorders &  Cardiovascular Risk Reduction [facility] Diplomate of the American Board of Clinical Lipidology Attending Cardiologist  Direct Dial: [phone]  Fax: [phone]  Website:  [email or web address] 09/13/2020, 2:31 PM

## 2020-09-13 NOTE — Patient Instructions (Addendum)
Medication Instructions:  INCREASE pitavastatin (livalo) to 2mg   *If you need a refill on your cardiac medications before your next appointment, please call your pharmacy*   Lab Work: LIPID PANEL in 3 months (fasting)  If you have labs (blood work) drawn today and your tests are completely normal, you will receive your results only by: [name] MyChart Message (if you have MyChart) OR . A paper copy in the mail If you have any lab test that is abnormal or we need to change your treatment, we will call you to review the results.   Testing/Procedures: Chest CT @ [address] [facility])   Follow-Up: At [facility], you and your health needs are our priority.  As part of our continuing mission to provide you with exceptional heart care, we have created designated Provider Care Teams.  These Care Teams include your primary Cardiologist (physician) and Advanced Practice Providers (APPs -  Physician Assistants and Nurse Practitioners) who all work together to provide you with the care you need, when you need it.  We recommend signing up for the patient portal called "MyChart".  Sign up information is provided on this After Visit Summary.  MyChart is used to connect with patients for Virtual Visits (Telemedicine).  Patients are able to view lab/test results, encounter notes, upcoming appointments, etc.  Non-urgent messages can be sent to your provider as well.   To learn more about what you can do with MyChart, go to [email or web address].    Your next appointment:   12 month(s)  The format for your next appointment:   In Person  Provider:   You may see Pixie Casino, MD or one of the following Advanced Practice Providers on your designated Care Team:    Almyra Deforest, PA-C  Fabian Sharp, PA-C or   Roby Lofts, [place]

## 2020-09-14 ENCOUNTER — Encounter: Payer: Self-pay | Admitting: Internal Medicine

## 2020-09-17 ENCOUNTER — Ambulatory Visit
Admission: RE | Admit: 2020-09-17 | Discharge: 2020-09-17 | Disposition: A | Discharge status: Home / Self Care | Payer: Medicare Other | Source: Ambulatory Visit | Attending: Internal Medicine | Admitting: Internal Medicine

## 2020-09-17 DIAGNOSIS — Z87891 Personal history of nicotine dependence: Secondary | ICD-10-CM | POA: Diagnosis not present

## 2020-09-17 DIAGNOSIS — Z8541 Personal history of malignant neoplasm of cervix uteri: Secondary | ICD-10-CM | POA: Diagnosis not present

## 2020-09-17 DIAGNOSIS — F17200 Nicotine dependence, unspecified, uncomplicated: Secondary | ICD-10-CM

## 2020-09-17 DIAGNOSIS — I2510 Atherosclerotic heart disease of native coronary artery without angina pectoris: Secondary | ICD-10-CM | POA: Diagnosis not present

## 2020-09-17 DIAGNOSIS — J432 Centrilobular emphysema: Secondary | ICD-10-CM | POA: Diagnosis not present

## 2020-09-20 ENCOUNTER — Other Ambulatory Visit: Payer: Self-pay | Admitting: *Deleted

## 2020-09-20 DIAGNOSIS — J449 Chronic obstructive pulmonary disease, unspecified: Secondary | ICD-10-CM

## 2020-09-20 DIAGNOSIS — F17200 Nicotine dependence, unspecified, uncomplicated: Secondary | ICD-10-CM

## 2020-09-20 DIAGNOSIS — Z87891 Personal history of nicotine dependence: Secondary | ICD-10-CM

## 2020-11-09 ENCOUNTER — Ambulatory Visit (INDEPENDENT_AMBULATORY_CARE_PROVIDER_SITE_OTHER): Payer: Medicare Other | Admitting: Registered Nurse

## 2020-11-09 ENCOUNTER — Other Ambulatory Visit: Payer: Self-pay

## 2020-11-09 ENCOUNTER — Encounter: Payer: Self-pay | Admitting: Registered Nurse

## 2020-11-09 VITALS — BP 160/91 | HR 100 | Temp 98.0°F | Resp 18 | Ht 63.5 in | Wt 134.8 lb

## 2020-11-09 DIAGNOSIS — S76211A Strain of adductor muscle, fascia and tendon of right thigh, initial encounter: Secondary | ICD-10-CM

## 2020-11-09 MED ORDER — PREDNISONE 20 MG PO TABS: 20.0000 mg | ORAL_TABLET | Freq: Every day | ORAL | 0 refills | Status: DC

## 2020-11-09 MED ORDER — METHOCARBAMOL 500 MG PO TABS: 500.0000 mg | ORAL_TABLET | Freq: Two times a day (BID) | ORAL | 0 refills | Status: DC | PRN

## 2020-11-09 NOTE — Patient Instructions (Signed)
° ° ° °  If you have lab work done today you will be contacted with your lab results within the next 2 weeks.  If you have not heard from us then please contact us. The fastest way to get your results is to register for My Chart. ° ° °IF you received an x-ray today, you will receive an invoice from [facility]. Please contact [facility] at [phone] with questions or concerns regarding your invoice.  ° °IF you received labwork today, you will receive an invoice from [facility]. Please contact [facility] at [phone] with questions or concerns regarding your invoice.  ° °Our billing staff will not be able to assist you with questions regarding bills from these companies. ° °You will be contacted with the lab results as soon as they are available. The fastest way to get your results is to activate your My Chart account. Instructions are located on the last page of this paperwork. If you have not heard from us regarding the results in 2 weeks, please contact this office. °  ° ° ° °

## 2020-11-10 ENCOUNTER — Encounter: Payer: Self-pay | Admitting: Registered Nurse

## 2020-11-10 NOTE — Progress Notes (Signed)
Established Patient Office Visit  Subjective:  Patient ID: Breanna Sharp, female    DOB: 08-31-1951  Age: 69 y.o. MRN: [id number]  CC:  Chief Complaint  Patient presents with  . Hip Pain    Patient states she has been having some pain in her right hip. Per patient she has arthritis but she believes she pulled a muscle 5 days ago. She has been taking tylenol for the pain and it seems to help some.    HPI Breanna Sharp presents for hip pain. R>L. Onset a few weeks ago States she was walking - she usually does 10-20k steps daily - and decided to go at a faster pace than usual. No acute pain but soreness ongoing for the next few days Still has full rom No tenderness, bruising, swelling, or loss of ROM Does note hx of OA in hips - last imaging done 4-5 years ago No neuro symptoms Pain is generally mild, relieved somewhat by OTCs No symptoms extending into legs or feet.  Past Medical History:  Diagnosis Date  . Allergy   . Anxiety   . Arthritis   . Cancer ([place])   . Dyslipidemia    statin intolerance  . GERD (gastroesophageal reflux disease)   . Heart murmur   . Hyperlipidemia   . Hypertension   . Vertigo     Past Surgical History:  Procedure Laterality Date  . [place] STUDY N/A 09/29/2019   Procedure: [place] STUDY;  Surgeon: Mauri Pole, MD;  [facility];  Service: Endoscopy;  Laterality: N/A;  . ABDOMINAL HYSTERECTOMY  1972   cervical cancer  . BIOPSY  11/24/2019   Procedure: BIOPSY;  Surgeon: Mauri Pole, MD;  Location: [facility];  Service: Endoscopy;;  . BRAVO [place] STUDY N/A 11/24/2019   Procedure: BRAVO [place];  Surgeon: Mauri Pole, MD;  Location[facility];  Service: Endoscopy;  Laterality: N/A;  . COLONOSCOPY    . ESOPHAGEAL MANOMETRY N/A 09/29/2019   Procedure: ESOPHAGEAL MANOMETRY (EM);  Surgeon: Mauri Pole, MD;  [facility];  Service: Endoscopy;  Laterality: N/A;  .  ESOPHAGOGASTRODUODENOSCOPY (EGD) WITH PROPOFOL N/A 11/24/2019   Procedure: ESOPHAGOGASTRODUODENOSCOPY (EGD) WITH PROPOFOL;  Surgeon: Mauri Pole, MD;  Location[facility];  Service: Endoscopy;  Laterality: N/A;  . WISDOM TOOTH EXTRACTION     uppers    Family History  Problem Relation Age of Onset  . Hypertension Mother        dx in her 38's  . Heart attack Mother 8  . Breast cancer Mother        unsure of age  . Heart attack Brother 46  . Diabetes Brother   . Hyperlipidemia Brother   . Hypertension Brother   . Heart disease Brother   . Colon cancer Neg Hx   . Esophageal cancer Neg Hx   . Rectal cancer Neg Hx   . Stomach cancer Neg Hx     Social History   Socioeconomic History  . Marital status: Married    Spouse name: [name]  . Number of children: 0  . Years of education: 13  . Highest education level: Not on file  Occupational History    Employer: RETIRED    Comment: Retired  Tobacco Use  . Smoking status: Former Smoker    Packs/day: 0.50    Years: 32.00    Pack years: 16.00    Types: Cigarettes    Quit date: 11/27/2002  Years since quitting: 17.9  . Smokeless tobacco: Never Used  Vaping Use  . Vaping Use: Never used  Substance and Sexual Activity  . Alcohol use: Yes    Alcohol/week: 3.0 standard drinks    Types: 3 Standard drinks or equivalent per week    Comment: social - 2 or 3 times a month 1-2 glasses of wine  . Drug use: No  . Sexual activity: Yes    Partners: Male  Other Topics Concern  . Not on file  Social History Narrative   Exercise 3 to 4 times/week walking for 45 min -1 hour   Patient lives at home with her husband Legrand [place]).   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   Right handed.   decaffeine Green Tea.   Social Determinants of Health   Financial Resource Strain: Not on file  Food Insecurity: Not on file  Transportation Needs: Not  on file  Physical Activity: Not on file  Stress: Not on file  Social Connections: Not on file  Intimate Partner Violence: Not on file    Outpatient Medications Prior to Visit  Medication Sig Dispense Refill  . acetaminophen (TYLENOL) 650 MG CR tablet Take 1,300 mg by mouth every 8 (eight) hours as needed for pain.    . Carboxymethylcellul-Glycerin (LUBRICATING EYE DROPS OP) Place 2 drops into both eyes daily.    [name] ezetimibe (ZETIA) 10 MG tablet Take 1 tablet (10 mg total) by mouth daily. 90 tablet 3  . fexofenadine (ALLEGRA) 180 MG tablet Take 180 mg by mouth daily.     [name] FIBER PO Take by mouth.    . fluticasone (FLONASE) 50 MCG/ACT nasal spray Place 2 sprays into both nostrils daily. 48 g 6  . Inulin (FIBER CHOICE PO) Take 2 capsules by mouth daily after supper.    [name] lisinopril-hydrochlorothiazide (ZESTORETIC) 20-12.5 MG tablet Take 1 tablet by mouth daily. 90 tablet 2  . Multiple Vitamin (MULTIVITAMIN WITH MINERALS) TABS tablet Take 1 tablet by mouth daily.    . Pitavastatin Calcium (LIVALO) 2 MG TABS Take 1 tablet (2 mg total) by mouth daily. 90 tablet 3  . Tiotropium Bromide Monohydrate (SPIRIVA RESPIMAT) 2.5 MCG/ACT AERS Take 2 puffs by mouth daily. 12 g 6  . Travoprost, BAK Free, (TRAVATAN) 0.004 % SOLN ophthalmic solution Place 1 drop into both eyes at bedtime.    . Vitamin D, Ergocalciferol, 50 MCG (2000 UT) CAPS Take by mouth.    [name] CARAWAY OIL-LEVOMENTHOL PO Take by mouth. (Patient not taking: Reported on 11/09/2020)    . esomeprazole (NEXIUM) 40 MG capsule Take 1 capsule (40 mg total) by mouth daily before breakfast. 90 capsule 3   No facility-administered medications prior to visit.    Allergies  Allergen Reactions  . Statins     Muscle cramps    ROS Review of Systems Per hpi     Objective:    Physical Exam Vitals and nursing note reviewed.  Constitutional:      Appearance: Normal appearance. She is normal weight.  Cardiovascular:     Rate and Rhythm: Normal  rate and regular rhythm.     Pulses: Normal pulses.  Pulmonary:     Effort: Pulmonary effort is normal. No respiratory distress.  Musculoskeletal:        General: No swelling, tenderness, deformity or signs of injury. Normal range of motion.     Right lower leg:  No edema.     Left lower leg: No edema.     Comments: Notes some discomfort with flexion of hip against resistance. Otherwise no concerns.  Skin:    General: Skin is warm and dry.     Capillary Refill: Capillary refill takes less than 2 seconds.  Neurological:     General: No focal deficit present.     Mental Status: She is alert and oriented to person, place, and time. Mental status is at baseline.  Psychiatric:        Mood and Affect: Mood normal.        Behavior: Behavior normal.        Thought Content: Thought content normal.        Judgment: Judgment normal.     BP (!) 160/91   Pulse 100   Temp 98 F (36.7 C) (Temporal)   Resp 18   Ht 5' 3.5" (1.613 m)   Wt 134 lb 12.8 oz (61.1 kg)   SpO2 97%   BMI 23.50 kg/m  Wt Readings from Last 3 Encounters:  11/09/20 134 lb 12.8 oz (61.1 kg)  09/13/20 137 lb 6.4 oz (62.3 kg)  07/14/20 137 lb (62.1 kg)     There are no preventive care reminders to display for this patient.  There are no preventive care reminders to display for this patient.  Lab Results  Component Value Date   TSH 2.390 12/03/2017   Lab Results  Component Value Date   WBC 5.2 09/01/2015   HGB 12.2 09/01/2015   HCT 37.2 09/01/2015   MCV 84.9 09/01/2015   PLT 235 09/01/2015   Lab Results  Component Value Date   NA 139 07/14/2020   K 3.9 07/14/2020   CO2 25 07/14/2020   GLUCOSE 113 (H) 07/14/2020   BUN 15 07/14/2020   CREATININE 0.81 07/14/2020   BILITOT 0.5 07/14/2020   ALKPHOS 83 07/14/2020   AST 21 07/14/2020   ALT 22 07/14/2020   PROT 7.0 07/14/2020   ALBUMIN 4.7 07/14/2020   CALCIUM 10.1 07/14/2020   Lab Results  Component Value Date   CHOL 214 (H) 07/14/2020   Lab Results   Component Value Date   HDL 61 07/14/2020   Lab Results  Component Value Date   LDLCALC 126 (H) 07/14/2020   Lab Results  Component Value Date   TRIG 152 (H) 07/14/2020   Lab Results  Component Value Date   CHOLHDL 3.5 07/14/2020   Lab Results  Component Value Date   HGBA1C 5.5 07/14/2020      Assessment & Plan:   Problem List Items Addressed This Visit   None   Visit Diagnoses    Strain of groin, right, initial encounter    -  Primary   Relevant Medications   predniSONE (DELTASONE) 20 MG tablet   methocarbamol (ROBAXIN) 500 MG tablet      Meds ordered this encounter  Medications  . predniSONE (DELTASONE) 20 MG tablet    Sig: Take 1 tablet (20 mg total) by mouth daily with breakfast.    Dispense:  4 tablet    Refill:  0    Order Specific Question:   Supervising Provider    Answer:   [name], JEFFREY R [[id number]]  . methocarbamol (ROBAXIN) 500 MG tablet    Sig: Take 1 tablet (500 mg total) by mouth 2 (two) times daily as needed for muscle spasms.    Dispense:  30 tablet    Refill:  0  Order Specific Question:   Supervising Provider    Answer:   [name], JEFFREY R [[id number]]    Follow-up: No follow-ups on file.   PLAN  Likely strain of groin or hip flexor. Seems mild. Doubt significant progression of OA at this time  Will give methocarbamol to take 1-2 times daily, short prednisone burst. Continue OTCs.   If pain is ongoing will consider imaging to assess status of OA.  Patient encouraged to call clinic with any questions, comments, or concerns.  Maximiano Coss, NP

## 2020-11-11 ENCOUNTER — Telehealth: Payer: Self-pay | Admitting: *Deleted

## 2020-11-11 NOTE — Telephone Encounter (Signed)
Patient states she will call back to reschedule.

## 2020-11-16 ENCOUNTER — Ambulatory Visit (INDEPENDENT_AMBULATORY_CARE_PROVIDER_SITE_OTHER): Payer: Medicare Other | Admitting: Emergency Medicine

## 2020-11-16 VITALS — BP 137/82 | Ht 63.5 in | Wt 134.0 lb

## 2020-11-16 DIAGNOSIS — Z0000 Encounter for general adult medical examination without abnormal findings: Secondary | ICD-10-CM

## 2020-11-16 NOTE — Progress Notes (Signed)
Presents today for [employer] Visit   Date of last exam: 11-09-2020    Interpreter used for this visit? No  I connected with  Breanna Sharp on 11/16/20 by a telephone application and verified that I am speaking with the correct person using two identifiers.   I discussed the limitations of evaluation and management by telemedicine. The patient expressed understanding and agreed to proceed.  Patient location: home  Provider location: in office  I provided 20 minutes of non face - to - face time during this encounter.   Patient Care Team: Breanna Agreste, MD as PCP - General (Family Medicine) Breanna Pickett Nadean Corwin, MD as PCP - Cardiology (Cardiology)   Other items to address today:   Discussed Eye/Dental Discussed Immunizations Follow Breanna Sharp 2/14 @ 8:20   Other Screening: Last screening for diabetes: 07/14/2020 Last lipid screening: 07/14/2020  ADVANCE DIRECTIVES: Discussed: yes On File no Materials Provided no  Immunization status:  Immunization History  Administered Date(s) Administered  . Fluad Quad(high Dose 65+) 07/23/2019, 08/23/2020  . Influenza Split 08/14/2012  . Influenza,inj,Quad PF,6+ Mos 08/20/2013, 08/26/2014, 09/01/2015, 08/31/2016, 09/04/2017  . Influenza-Unspecified 08/08/2018  . PFIZER SARS-COV-2 Vaccination 01/01/2020, 01/22/2020, 09/21/2020  . Pneumococcal Conjugate-13 09/01/2015  . Pneumococcal Polysaccharide-23 09/07/2016  . Tdap 08/20/2013  . Zoster 09/28/2011     There are no preventive care reminders to display for this patient.   Functional Status Survey: Is the patient deaf or have difficulty hearing?: No Does the patient have difficulty seeing, even when wearing glasses/contacts?: No Does the patient have difficulty concentrating, remembering, or making decisions?: No Does the patient have difficulty walking or climbing stairs?: No Does the patient have difficulty dressing or bathing?: No Does the patient  have difficulty doing errands alone such as visiting a doctor's office or shopping?: No   6CIT Screen 11/16/2020 11/13/2019 09/10/2018  What Year? 0 points 0 points 0 points  What month? 0 points 0 points 0 points  What time? 0 points 0 points 0 points  Count back from 20 0 points 0 points 0 points  Months in reverse 2 points 0 points 0 points  Repeat phrase 0 points 0 points 2 points  Total Score 2 0 2      Flowsheet Row Clinical Support from 11/16/2020 in [facility] [place]  AUDIT-C Score 2       Home Environment:   Lives in a two story home No scattered rugs No grab bars  Adequate lighting/ no clutter No trouble climbing stairs   Patient Active Problem List   Diagnosis Date Noted  . Gastritis and gastroduodenitis   . Gastroesophageal reflux disease without esophagitis   . Esophageal dysphagia   . Globus sensation   . H/O abdominal hysterectomy 06/04/2019  . Vaginal discharge 06/04/2019  . History of cervical cancer 06/04/2019  . Vaginal atrophy 06/04/2019  . Labial lesion 06/04/2019  . Epidemic vertigo 01/30/2018  . COPD, group A, by GOLD 2017 classification ([place]) 06/06/2017  . Dyslipidemia 05/11/2016  . Shortness of breath 05/11/2016  . Essential hypertension 05/11/2016  . Osteopenia 10/12/2015  . DOE (dyspnea on exertion) 05/02/2013  . Chronic sinusitis 05/02/2013  . Vertigo 08/14/2012  . Benign essential HTN 08/14/2012  . BMI 28.0-28.9,adult 08/14/2012  . COLONIC POLYPS 01/24/2008  . ACUT GASTR ULCER W/O MENTION HEMORR PERF/OBST 01/24/2008     Past Medical History:  Diagnosis Date  . Allergy   . Anxiety   . Arthritis   .  Cancer ([place])   . Dyslipidemia    statin intolerance  . GERD (gastroesophageal reflux disease)   . Heart murmur   . Hyperlipidemia   . Hypertension   . Vertigo      Past Surgical History:  Procedure Laterality Date  . [place] STUDY N/A 09/29/2019   Procedure: [place] STUDY;  Surgeon: Mauri Pole, MD;   Location: [facility];  Service: Endoscopy;  Laterality: N/A;  . ABDOMINAL HYSTERECTOMY  1972   cervical cancer  . BIOPSY  11/24/2019   Procedure: BIOPSY;  Surgeon: Mauri Pole, MD;  Location: [facility];  Service: Endoscopy;;  . BRAVO [place] STUDY N/A 11/24/2019   Procedure: BRAVO [place];  Surgeon: Mauri Pole, MD;  Location: [facility];  Service: Endoscopy;  Laterality: N/A;  . COLONOSCOPY    . ESOPHAGEAL MANOMETRY N/A 09/29/2019   Procedure: ESOPHAGEAL MANOMETRY (EM);  Surgeon: Mauri Pole, MD;  Location: [facility];  Service: Endoscopy;  Laterality: N/A;  . ESOPHAGOGASTRODUODENOSCOPY (EGD) WITH PROPOFOL N/A 11/24/2019   Procedure: ESOPHAGOGASTRODUODENOSCOPY (EGD) WITH PROPOFOL;  Surgeon: Mauri Pole, MD;  Location: [facility];  Service: Endoscopy;  Laterality: N/A;  . WISDOM TOOTH EXTRACTION     uppers     Family History  Problem Relation Age of Onset  . Hypertension Mother        dx in her 46's  . Heart attack Mother 36  . Breast cancer Mother        unsure of age  . Heart attack Brother 49  . Diabetes Brother   . Hyperlipidemia Brother   . Hypertension Brother   . Heart disease Brother   . Colon cancer Neg Hx   . Esophageal cancer Neg Hx   . Rectal cancer Neg Hx   . Stomach cancer Neg Hx      Social History   Socioeconomic History  . Marital status: Married    Spouse name: Breanna Sharp  . Number of children: 0  . Years of education: 38  . Highest education level: Not on file  Occupational History    Employer: RETIRED    Comment: Retired  Tobacco Use  . Smoking status: Former Smoker    Packs/day: 0.50    Years: 32.00    Pack years: 16.00    Types: Cigarettes    Quit date: 11/27/2002    Years since quitting: 17.9  . Smokeless tobacco: Never Used  Vaping Use  . Vaping Use: Never used  Substance and Sexual Activity  . Alcohol use: Yes    Alcohol/week: 3.0 standard drinks    Types: 3 Standard drinks or equivalent per week     Comment: social - 2 or 3 times a month 1-2 glasses of wine  . Drug use: No  . Sexual activity: Yes    Partners: Male  Other Topics Concern  . Not on file  Social History Narrative   Exercise 3 to 4 times/week walking for 45 min -1 hour   Patient lives at home with her husband Breanna Sharp).   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   Right handed.   decaffeine Green Tea.   Social Determinants of Health   Financial Resource Strain: Not on file  Food Insecurity: Not on file  Transportation Needs: Not on file  Physical Activity: Not on file  Stress: Not on file  Social Connections: Not on file  Intimate Partner Violence: Not on file     Allergies  Allergen Reactions  . Statins     Muscle cramps     Prior to Admission medications   Medication Sig Start Date End Date Taking? Authorizing Provider  acetaminophen (TYLENOL) 650 MG CR tablet Take 1,300 mg by mouth every 8 (eight) hours as needed for pain.   Yes [provider]  CARAWAY OIL-LEVOMENTHOL PO Take by mouth.   Yes [provider]  Carboxymethylcellul-Glycerin (LUBRICATING EYE DROPS OP) Place 2 drops into both eyes daily.   Yes [provider]  ezetimibe (ZETIA) 10 MG tablet Take 1 tablet (10 mg total) by mouth daily. 07/14/20  Yes Breanna Agreste, MD  fexofenadine (ALLEGRA) 180 MG tablet Take 180 mg by mouth daily.  10/04/13  Yes [provider]  FIBER PO Take by mouth.   Yes [provider]  fluticasone (FLONASE) 50 MCG/ACT nasal spray Place 2 sprays into both nostrils daily. 01/12/20  Yes Breanna Agreste, MD  Inulin (FIBER CHOICE PO) Take 2 capsules by mouth daily after supper.   Yes [provider]  lisinopril-hydrochlorothiazide (ZESTORETIC) 20-12.5 MG tablet Take 1 tablet by mouth daily. 07/14/20  Yes Breanna Agreste, MD  methocarbamol (ROBAXIN) 500 MG tablet Take 1 tablet  (500 mg total) by mouth 2 (two) times daily as needed for muscle spasms. 11/09/20  Yes Maximiano Coss, NP  Multiple Vitamin (MULTIVITAMIN WITH MINERALS) TABS tablet Take 1 tablet by mouth daily.   Yes [provider]  Pitavastatin Calcium (LIVALO) 2 MG TABS Take 1 tablet (2 mg total) by mouth daily. 09/13/20  Yes Hilty, Nadean Corwin, MD  predniSONE (DELTASONE) 20 MG tablet Take 1 tablet (20 mg total) by mouth daily with breakfast. 11/09/20  Yes Maximiano Coss, NP  Tiotropium Bromide Monohydrate (SPIRIVA RESPIMAT) 2.5 MCG/ACT AERS Take 2 puffs by mouth daily. 01/12/20  Yes Breanna Agreste, MD  Travoprost, BAK Free, (TRAVATAN) 0.004 % SOLN ophthalmic solution Place 1 drop into both eyes at bedtime.   Yes [provider]  Vitamin D, Ergocalciferol, 50 MCG (2000 UT) CAPS Take by mouth.   Yes [provider]  esomeprazole (NEXIUM) 40 MG capsule Take 1 capsule (40 mg total) by mouth daily before breakfast. 01/06/20 09/13/20  Mauri Pole, MD     Depression screen [facility] 2/9 11/16/2020 11/09/2020 07/14/2020 01/12/2020 11/13/2019  Decreased Interest 0 0 0 0 0  Down, Depressed, Hopeless 0 0 0 0 0  PHQ - 2 Score 0 0 0 0 0     Fall Risk  11/16/2020 11/09/2020 07/14/2020 01/12/2020 11/13/2019  Falls in the past year? 0 0 0 0 0  Number falls in past yr: 0 0 - 0 0  Injury with Fall? 0 0 - 0 0  Follow up Falls evaluation completed;Education provided Falls evaluation completed Falls evaluation completed Falls evaluation completed Falls evaluation completed;Education provided      PHYSICAL EXAM: BP 137/82 Comment: per patient taken at home today  Ht 5' 3.5" (1.613 m)   Wt 134 lb (60.8 kg)   BMI 23.36 kg/m    Wt Readings from Last 3 Encounters:  11/16/20 134 lb (60.8 kg)  11/09/20 134 lb 12.8 oz (61.1 kg)  09/13/20 137 lb 6.4 oz (62.3 kg)       Education/Counseling provided regarding diet and exercise, prevention of chronic diseases, smoking/tobacco cessation, if  applicable, and reviewed "Covered Medicare Preventive Services."

## 2020-11-16 NOTE — Patient Instructions (Signed)
Thank you for taking time to come for your Medicare Wellness Visit. I appreciate your ongoing commitment to your health goals. Please review the following plan we discussed and let me know if I can assist you in the future.  Jsoeph Podesta LPN  Preventive Care 70 Years and Older, Female Preventive care refers to lifestyle choices and visits with your health care provider that can promote health and wellness. This includes:  A yearly physical exam. This is also called an annual well check.  Regular dental and eye exams.  Immunizations.  Screening for certain conditions.  Healthy lifestyle choices, such as diet and exercise. What can I expect for my preventive care visit? Physical exam Your health care provider will check:  Height and weight. These may be used to calculate body mass index (BMI), which is a measurement that tells if you are at a healthy weight.  Heart rate and blood pressure.  Your skin for abnormal spots. Counseling Your health care provider may ask you questions about:  Alcohol, tobacco, and drug use.  Emotional well-being.  Home and relationship well-being.  Sexual activity.  Eating habits.  History of falls.  Memory and ability to understand (cognition).  Work and work environment.  Pregnancy and menstrual history. What immunizations do I need?  Influenza (flu) vaccine  This is recommended every year. Tetanus, diphtheria, and pertussis (Tdap) vaccine  You may need a Td booster every 10 years. Varicella (chickenpox) vaccine  You may need this vaccine if you have not already been vaccinated. Zoster (shingles) vaccine  You may need this after age 70. Pneumococcal conjugate (PCV13) vaccine  One dose is recommended after age 70. Pneumococcal polysaccharide (PPSV23) vaccine  One dose is recommended after age 70. Measles, mumps, and rubella (MMR) vaccine  You may need at least one dose of MMR if you were born in 1957 or later. You may also  need a second dose. Meningococcal conjugate (MenACWY) vaccine  You may need this if you have certain conditions. Hepatitis A vaccine  You may need this if you have certain conditions or if you travel or work in places where you may be exposed to hepatitis A. Hepatitis B vaccine  You may need this if you have certain conditions or if you travel or work in places where you may be exposed to hepatitis B. Haemophilus influenzae type b (Hib) vaccine  You may need this if you have certain conditions. You may receive vaccines as individual doses or as more than one vaccine together in one shot (combination vaccines). Talk with your health care provider about the risks and benefits of combination vaccines. What tests do I need? Blood tests  Lipid and cholesterol levels. These may be checked every 5 years, or more frequently depending on your overall health.  Hepatitis C test.  Hepatitis B test. Screening  Lung cancer screening. You may have this screening every year starting at age 70 if you have a 30-pack-year history of smoking and currently smoke or have quit within the past 15 years.  Colorectal cancer screening. All adults should have this screening starting at age 70 and continuing until age 75. Your health care provider may recommend screening at age 45 if you are at increased risk. You will have tests every 1-10 years, depending on your results and the type of screening test.  Diabetes screening. This is done by checking your blood sugar (glucose) after you have not eaten for a while (fasting). You may have this done every 1-3   years.  Mammogram. This may be done every 1-2 years. Talk with your health care provider about how often you should have regular mammograms.  BRCA-related cancer screening. This may be done if you have a family history of breast, ovarian, tubal, or peritoneal cancers. Other tests  Sexually transmitted disease (STD) testing.  Bone density scan. This is done  to screen for osteoporosis. You may have this done starting at age 70. Follow these instructions at home: Eating and drinking  Eat a diet that includes fresh fruits and vegetables, whole grains, lean protein, and low-fat dairy products. Limit your intake of foods with high amounts of sugar, saturated fats, and salt.  Take vitamin and mineral supplements as recommended by your health care provider.  Do not drink alcohol if your health care provider tells you not to drink.  If you drink alcohol: ? Limit how much you have to 0-1 drink a day. ? Be aware of how much alcohol is in your drink. In the [place] one drink equals one 12 oz bottle of beer (355 mL), one 5 oz glass of wine (148 mL), or one 1 oz glass of hard liquor (44 mL). Lifestyle  Take daily care of your teeth and gums.  Stay active. Exercise for at least 30 minutes on 5 or more days each week.  Do not use any products that contain nicotine or tobacco, such as cigarettes, e-cigarettes, and chewing tobacco. If you need help quitting, ask your health care provider.  If you are sexually active, practice safe sex. Use a condom or other form of protection in order to prevent STIs (sexually transmitted infections).  Talk with your health care provider about taking a low-dose aspirin or statin. What's next?  Go to your health care provider once a year for a well check visit.  Ask your health care provider how often you should have your eyes and teeth checked.  Stay up to date on all vaccines. This information is not intended to replace advice given to you by your health care provider. Make sure you discuss any questions you have with your health care provider. Document Revised: 11/07/2018 Document Reviewed: 11/07/2018 Elsevier Patient Education  2020 [employer].

## 2020-11-29 ENCOUNTER — Ambulatory Visit: Payer: Medicare Other | Admitting: Registered Nurse

## 2020-12-02 ENCOUNTER — Ambulatory Visit (INDEPENDENT_AMBULATORY_CARE_PROVIDER_SITE_OTHER): Payer: Medicare Other | Admitting: Family Medicine

## 2020-12-02 ENCOUNTER — Encounter: Payer: Self-pay | Admitting: Family Medicine

## 2020-12-02 ENCOUNTER — Ambulatory Visit (INDEPENDENT_AMBULATORY_CARE_PROVIDER_SITE_OTHER): Payer: Medicare Other

## 2020-12-02 ENCOUNTER — Other Ambulatory Visit: Payer: Self-pay

## 2020-12-02 VITALS — BP 139/79 | HR 84 | Temp 97.8°F | Resp 14 | Ht 63.5 in | Wt 137.8 lb

## 2020-12-02 DIAGNOSIS — M25551 Pain in right hip: Secondary | ICD-10-CM | POA: Diagnosis not present

## 2020-12-02 DIAGNOSIS — R1031 Right lower quadrant pain: Secondary | ICD-10-CM

## 2020-12-02 DIAGNOSIS — M25851 Other specified joint disorders, right hip: Secondary | ICD-10-CM | POA: Diagnosis not present

## 2020-12-02 DIAGNOSIS — M1611 Unilateral primary osteoarthritis, right hip: Secondary | ICD-10-CM | POA: Diagnosis not present

## 2020-12-02 MED ORDER — DICLOFENAC SODIUM 75 MG PO TBEC: 75.0000 mg | DELAYED_RELEASE_TABLET | Freq: Two times a day (BID) | ORAL | 0 refills | Status: DC

## 2020-12-02 NOTE — Progress Notes (Signed)
Patient ID: Breanna Sharp, female    DOB: July 23, 1951  Age: 70 y.o. MRN: [id number]  Chief Complaint  Patient presents with  . Follow-up    Pt here for a follow up from visit for possibly pulled muscle notes the medication she was given didn't help notes it has good and bad days with the pain. Started almost a month ago.     Subjective:   Patient was here a month ago with right groin pain.  It subsided a little bit, now it is worse again.  She says she knows she has some arthritis in her hips.  She does have pain in the right groin and in the right hip area posteriorly.  She says she gets out of bed and is okay in the morning but an hour or 2 later she hurts more if she is walking.  She has trouble climbing the steps when she gets stiffer.  No known injury.  She has a history of stomach issues, but endoscopy a year ago showed gastritis and no active ulcer.  On reviewing her labs her old renal function has been good and she does have some dyspepsia at times.  Current allergies, medications, problem list, past/family and social histories reviewed.  Objective:  BP 139/79   Pulse 84   Temp 97.8 F (36.6 C) (Temporal)   Resp 14   Ht 5' 3.5" (1.613 m)   Wt 137 lb 12.8 oz (62.5 kg)   SpO2 98%   BMI 24.03 kg/m   No major distress.  Ambulates well in the room.  No real tenderness in the right groin.  No hernia could be palpated.  Range of motion is good.  Mild tenderness in the right buttock.  X-ray shows mild arthritic disease.  Hips are similar.  Assessment & Plan:   Assessment: 1. Primary osteoarthritis of right hip   2. Right hip pain   3. Right groin pain       Plan: X-ray hip  Orders Placed This Encounter  Procedures  . DG HIP UNILAT W OR W/O PELVIS 2-3 VIEWS RIGHT    Standing Status:   Future    Number of Occurrences:   1    Standing Expiration Date:   12/02/2021    Order Specific Question:   Reason for Exam (SYMPTOM  OR DIAGNOSIS REQUIRED)    Answer:   right hip  pain    Order Specific Question:   Preferred imaging location?    Answer:   External    No orders of the defined types were placed in this encounter.        Patient Instructions   Take diclofenac 75 mg 1 twice daily with food for the inflammation in the hip.  If you get any stomach pain discontinue the medication.  You can also take Tylenol (acetaminophen) 500 mg 2 pills 3 times daily as needed for pain.  Do not exceed 3000 mg in 24 hours.  If the pain persists despite 2 weeks of the medication, or if you cannot tolerate the medication, we will refer you to an orthopedic doctor to evaluate further.  Contact us if problems.   If you have lab work done today you will be contacted with your lab results within the next 2 weeks.  If you have not heard from us then please contact us. The fastest way to get your results is to register for My Chart.   IF you received an x-ray today, you will receive an  invoice from [facility]. Please contact [facility] at [phone] with questions or concerns regarding your invoice.   IF you received labwork today, you will receive an invoice from [facility]. Please contact [facility] at [phone] with questions or concerns regarding your invoice.   Our billing staff will not be able to assist you with questions regarding bills from these companies.  You will be contacted with the lab results as soon as they are available. The fastest way to get your results is to activate your My Chart account. Instructions are located on the last page of this paperwork. If you have not heard from [place] regarding the results in 2 weeks, please contact this office.        Return if symptoms worsen or fail to improve.   Janace Hoard, MD 12/02/2020

## 2020-12-02 NOTE — Patient Instructions (Addendum)
Take diclofenac 75 mg 1 twice daily with food for the inflammation in the hip.  If you get any stomach pain discontinue the medication.  You can also take Tylenol (acetaminophen) 500 mg 2 pills 3 times daily as needed for pain.  Do not exceed 3000 mg in 24 hours.  If the pain persists despite 2 weeks of the medication, or if you cannot tolerate the medication, we will refer you to an orthopedic doctor to evaluate further.  Contact us if problems.   If you have lab work done today you will be contacted with your lab results within the next 2 weeks.  If you have not heard from [place] then please contact us. The fastest way to get your results is to register for My Chart.   IF you received an x-ray today, you will receive an invoice from [facility]. Please contact [facility] at [phone] with questions or concerns regarding your invoice.   IF you received labwork today, you will receive an invoice from [facility]. Please contact [facility] at [phone] with questions or concerns regarding your invoice.   Our billing staff will not be able to assist you with questions regarding bills from these companies.  You will be contacted with the lab results as soon as they are available. The fastest way to get your results is to activate your My Chart account. Instructions are located on the last page of this paperwork. If you have not heard from [place] regarding the results in 2 weeks, please contact this office.

## 2020-12-06 ENCOUNTER — Telehealth: Payer: Self-pay | Admitting: Family Medicine

## 2020-12-06 NOTE — Telephone Encounter (Signed)
Pt was seen on 12/02/20 with Breanna Sharp for a possible pulled muscle. She is wanting a referral to an orthopedist and she was told this-If the pain persists despite 2 weeks of the medication, or if you cannot tolerate the medication, we will refer you to an orthopedic doctor to evaluate further.  Please advise pt at [phone].

## 2020-12-08 ENCOUNTER — Other Ambulatory Visit: Payer: Self-pay | Admitting: Emergency Medicine

## 2020-12-08 DIAGNOSIS — S76211A Strain of adductor muscle, fascia and tendon of right thigh, initial encounter: Secondary | ICD-10-CM

## 2020-12-08 DIAGNOSIS — M25551 Pain in right hip: Secondary | ICD-10-CM

## 2020-12-08 DIAGNOSIS — M1611 Unilateral primary osteoarthritis, right hip: Secondary | ICD-10-CM

## 2020-12-08 NOTE — Telephone Encounter (Signed)
Patient was informed that we have placed an referral for her hip and leg pain. Someone will call her with this appt time and date

## 2020-12-14 ENCOUNTER — Telehealth: Payer: Self-pay | Admitting: *Deleted

## 2020-12-14 MED ORDER — ESOMEPRAZOLE MAGNESIUM 40 MG PO CPDR: 40.0000 mg | DELAYED_RELEASE_CAPSULE | Freq: Every day | ORAL | 3 refills | Status: DC

## 2020-12-14 NOTE — Telephone Encounter (Signed)
Fax request from pharmacy for Nexium refills.  Sent electronically today

## 2020-12-15 ENCOUNTER — Ambulatory Visit: Payer: Medicare Other | Admitting: Orthopaedic Surgery

## 2020-12-16 DIAGNOSIS — E785 Hyperlipidemia, unspecified: Secondary | ICD-10-CM | POA: Diagnosis not present

## 2020-12-16 LAB — LIPID PANEL
Chol/HDL Ratio: 2.9 ratio (ref 0.0–4.4)
Cholesterol, Total: 173 mg/dL (ref 100–199)
HDL: 60 mg/dL (ref >39)
LDL Chol Calc (NIH): 90 mg/dL (ref 0–99)
Triglycerides: 134 mg/dL (ref 0–149)
VLDL Cholesterol Cal: 23 mg/dL (ref 5–40)

## 2020-12-23 ENCOUNTER — Ambulatory Visit (INDEPENDENT_AMBULATORY_CARE_PROVIDER_SITE_OTHER): Payer: Medicare Other | Admitting: Orthopaedic Surgery

## 2020-12-23 ENCOUNTER — Other Ambulatory Visit: Payer: Self-pay

## 2020-12-23 ENCOUNTER — Encounter: Payer: Self-pay | Admitting: Orthopaedic Surgery

## 2020-12-23 DIAGNOSIS — H18413 Arcus senilis, bilateral: Secondary | ICD-10-CM | POA: Diagnosis not present

## 2020-12-23 DIAGNOSIS — H401131 Primary open-angle glaucoma, bilateral, mild stage: Secondary | ICD-10-CM | POA: Diagnosis not present

## 2020-12-23 DIAGNOSIS — H2513 Age-related nuclear cataract, bilateral: Secondary | ICD-10-CM | POA: Diagnosis not present

## 2020-12-23 DIAGNOSIS — H43813 Vitreous degeneration, bilateral: Secondary | ICD-10-CM | POA: Diagnosis not present

## 2020-12-23 DIAGNOSIS — M160 Bilateral primary osteoarthritis of hip: Secondary | ICD-10-CM | POA: Diagnosis not present

## 2020-12-23 NOTE — Progress Notes (Signed)
Office Visit Note   Patient: Breanna Sharp           Date of Birth: May 19, 1951           MRN: [id number] Visit Date: 12/23/2020              Requested by: Wendie Agreste, MD [address] [place],  [place] [address] PCP: Wendie Agreste, MD   Assessment & Plan: Visit Diagnoses:  1. Bilateral primary osteoarthritis of hip     Plan: Breanna Sharp has had some pain off and on in the area of her right groin. She recently was seen by her family physician with x-rays demonstrating arthritis in her right hip. She notes that presently her pain is only 1 out of 10 and when she is symptomatic she just takes Tylenol. I did review her films and there is certainly degenerative changes more in the right than the left hip but certainly not to the degree that she would require hip replacement. Long discussion regarding continuing her exercises and taking the Tylenol. Could always consider cortisone injection in her hip at some point in the future if the other treatments do not help .at some point in the future she may be a candidate for hip replacement.  Follow-Up Instructions: Return if symptoms worsen or fail to improve.   Orders:  No orders of the defined types were placed in this encounter.  No orders of the defined types were placed in this encounter.     Procedures: No procedures performed   Clinical Data: No additional findings.   Subjective: Chief Complaint  Patient presents with  . Right Hip - Pain  Breanna Sharp relates that she has had some pain off and on in the area of her right anterior groin for several months. She was recently seen by her primary care physician with x-rays demonstrating some arthritic changes of both hips but more on the right than the left. She is really not symptomatic on the left. She is occasionally had some anterior thigh pain but no buttock or lateral joint pain. No related numbness or tingling. She remains very active and even walks 4 to 6  miles a day  HPI  Review of Systems   Objective: Vital Signs: There were no vitals taken for this visit.  Physical Exam Constitutional:      Appearance: She is well-developed and well-nourished.  HENT:     Mouth/Throat:     Mouth: Oropharynx is clear and moist.  Eyes:     Extraocular Movements: EOM normal.     Pupils: Pupils are equal, round, and reactive to light.  Pulmonary:     Effort: Pulmonary effort is normal.  Skin:    General: Skin is warm and dry.  Neurological:     Mental Status: She is alert and oriented to person, place, and time.  Psychiatric:        Mood and Affect: Mood and affect normal.        Behavior: Behavior normal.     Ortho Exam awake alert and oriented x3. Comfortable sitting. Walk without a limp. Had very minimal loss of internal/external rotation of the right hip compared to the left with very mild discomfort. No pain with flexion or extension. Motor exam intact. No distal edema. Straight leg raise negative  Specialty Comments:  No specialty comments available.  Imaging: No results found.   PMFS History: Patient Active Problem List   Diagnosis Date Noted  . Bilateral primary  osteoarthritis of hip 12/23/2020  . Gastritis and gastroduodenitis   . Gastroesophageal reflux disease without esophagitis   . Esophageal dysphagia   . Globus sensation   . H/O abdominal hysterectomy 06/04/2019  . Vaginal discharge 06/04/2019  . History of cervical cancer 06/04/2019  . Vaginal atrophy 06/04/2019  . Labial lesion 06/04/2019  . Epidemic vertigo 01/30/2018  . COPD, group A, by GOLD 2017 classification ([place]) 06/06/2017  . Dyslipidemia 05/11/2016  . Shortness of breath 05/11/2016  . Essential hypertension 05/11/2016  . Osteopenia 10/12/2015  . DOE (dyspnea on exertion) 05/02/2013  . Chronic sinusitis 05/02/2013  . Vertigo 08/14/2012  . Benign essential HTN 08/14/2012  . BMI 28.0-28.9,adult 08/14/2012  . COLONIC POLYPS 01/24/2008  . ACUT GASTR  ULCER W/O MENTION HEMORR PERF/OBST 01/24/2008   Past Medical History:  Diagnosis Date  . Allergy   . Anxiety   . Arthritis   . Cancer ([place])   . Dyslipidemia    statin intolerance  . GERD (gastroesophageal reflux disease)   . Heart murmur   . Hyperlipidemia   . Hypertension   . Vertigo     Family History  Problem Relation Age of Onset  . Hypertension Mother        dx in her 67's  . Heart attack Mother 22  . Breast cancer Mother        unsure of age  . Heart attack Brother 85  . Diabetes Brother   . Hyperlipidemia Brother   . Hypertension Brother   . Heart disease Brother   . Colon cancer Neg Hx   . Esophageal cancer Neg Hx   . Rectal cancer Neg Hx   . Stomach cancer Neg Hx     Past Surgical History:  Procedure Laterality Date  . [place] STUDY N/A 09/29/2019   Procedure: [place] STUDY;  Surgeon: Mauri Pole, MD;  Location: [facility];  Service: Endoscopy;  Laterality: N/A;  . ABDOMINAL HYSTERECTOMY  1972   cervical cancer  . BIOPSY  11/24/2019   Procedure: BIOPSY;  Surgeon: Mauri Pole, MD;  Location: [facility];  Service: Endoscopy;;  . BRAVO [place] STUDY N/A 11/24/2019   Procedure: BRAVO [place];  Surgeon: Mauri Pole, MD;  Location: [facility];  Service: Endoscopy;  Laterality: N/A;  . COLONOSCOPY    . ESOPHAGEAL MANOMETRY N/A 09/29/2019   Procedure: ESOPHAGEAL MANOMETRY (EM);  Surgeon: Mauri Pole, MD;  [facility]: [facility];  Service: Endoscopy;  Laterality: N/A;  . ESOPHAGOGASTRODUODENOSCOPY (EGD) WITH PROPOFOL N/A 11/24/2019   Procedure: ESOPHAGOGASTRODUODENOSCOPY (EGD) WITH PROPOFOL;  Surgeon: Mauri Pole, MD;  [facility];  Service: Endoscopy;  Laterality: N/A;  . WISDOM TOOTH EXTRACTION     uppers   Social History   Occupational History    Employer: RETIRED    Comment: Retired  Tobacco Use  . Smoking status: Former Smoker    Packs/day: 0.50    Years: 32.00    Pack years: 16.00    Types:  Cigarettes    Quit date: 11/27/2002    Years since quitting: 18.0  . Smokeless tobacco: Never Used  Vaping Use  . Vaping Use: Never used  Substance and Sexual Activity  . Alcohol use: Yes    Alcohol/week: 3.0 standard drinks    Types: 3 Standard drinks or equivalent per week    Comment: social - 2 or 3 times a month 1-2 glasses of wine  . Drug use: No  . Sexual activity: Yes  Partners: Male     Garald Balding, MD   Note - This record has been created using [employer].  Chart creation errors have been sought, but may not always  have been located. Such creation errors do not reflect on  the standard of medical care.

## 2021-01-10 ENCOUNTER — Other Ambulatory Visit: Payer: Self-pay

## 2021-01-10 ENCOUNTER — Encounter: Payer: Self-pay | Admitting: Family Medicine

## 2021-01-10 ENCOUNTER — Ambulatory Visit (INDEPENDENT_AMBULATORY_CARE_PROVIDER_SITE_OTHER): Payer: Medicare Other | Admitting: Family Medicine

## 2021-01-10 VITALS — BP 142/76 | HR 97 | Temp 98.3°F | Ht 63.5 in | Wt 137.0 lb

## 2021-01-10 DIAGNOSIS — R7303 Prediabetes: Secondary | ICD-10-CM

## 2021-01-10 DIAGNOSIS — I10 Essential (primary) hypertension: Secondary | ICD-10-CM | POA: Diagnosis not present

## 2021-01-10 DIAGNOSIS — J301 Allergic rhinitis due to pollen: Secondary | ICD-10-CM | POA: Diagnosis not present

## 2021-01-10 DIAGNOSIS — E785 Hyperlipidemia, unspecified: Secondary | ICD-10-CM | POA: Diagnosis not present

## 2021-01-10 DIAGNOSIS — J449 Chronic obstructive pulmonary disease, unspecified: Secondary | ICD-10-CM

## 2021-01-10 MED ORDER — FLUTICASONE PROPIONATE 50 MCG/ACT NA SUSP: 2.0000 | Freq: Every day | NASAL | 6 refills | Status: DC

## 2021-01-10 MED ORDER — SPIRIVA RESPIMAT 2.5 MCG/ACT IN AERS: 2.0000 | INHALATION_SPRAY | Freq: Every day | RESPIRATORY_TRACT | 6 refills | Status: DC

## 2021-01-10 MED ORDER — LISINOPRIL-HYDROCHLOROTHIAZIDE 20-12.5 MG PO TABS: 1.0000 | ORAL_TABLET | Freq: Every day | ORAL | 2 refills | Status: DC

## 2021-01-10 NOTE — Progress Notes (Signed)
Subjective:  Patient ID: Breanna Sharp, female    DOB: [birth year]  Age: 70 y.o. MRN: [id number]  CC:  Chief Complaint  Patient presents with  . Follow-up    On hypertension. Pt reports no issues with BP since last OV. Pt rep[orts the usual BP is 115/72. Pt reports No side effects from her current medication. PT reports Dr.Hilty is taking care of the pt's hyperlipidemia. Pt wants the provider to know this information.     HPI Breanna Sharp presents for   Hypertension: Lisinopril HCTZ 20/12.5 mg daily. Home readings: 115/72.  No new side effects of meds. Highest 130/80 - sodium related. Low 107/71.  BP Readings from Last 3 Encounters:  01/10/21 (!) 142/76  12/02/20 139/79  11/16/20 137/82   Lab Results  Component Value Date   CREATININE 0.81 07/14/2020    Allergies stable with use of flonase.  Hyperlipidemia: Followed by Dr. Debara Pickett.Treated with Adela Glimpse Lab Results  Component Value Date   CHOL 173 12/16/2020   HDL 60 12/16/2020   LDLCALC 90 12/16/2020   TRIG 134 12/16/2020   CHOLHDL 2.9 12/16/2020   Lab Results  Component Value Date   ALT 22 07/14/2020   AST 21 07/14/2020   ALKPHOS 83 07/14/2020   BILITOT 0.5 07/14/2020   COPD Treated with Spiriva. 2 puffs daily. Doing ok. Stable. No flairs since last visit/ER visits.   Prediabetes: A1c 5.7 1 yr ago. Normal past 2 readings.  Lab Results  Component Value Date   HGBA1C 5.5 07/14/2020   Wt Readings from Last 3 Encounters:  01/10/21 137 lb (62.1 kg)  12/02/20 137 lb 12.8 oz (62.5 kg)  11/16/20 134 lb (60.8 kg)     History Patient Active Problem List   Diagnosis Date Noted  . Bilateral primary osteoarthritis of hip 12/23/2020  . Gastritis and gastroduodenitis   . Gastroesophageal reflux disease without esophagitis   . Esophageal dysphagia   . Globus sensation   . H/O abdominal hysterectomy 06/04/2019  . Vaginal discharge 06/04/2019  . History of cervical cancer 06/04/2019  . Vaginal  atrophy 06/04/2019  . Labial lesion 06/04/2019  . Epidemic vertigo 01/30/2018  . COPD, group A, by GOLD 2017 classification ([place]) 06/06/2017  . Dyslipidemia 05/11/2016  . Shortness of breath 05/11/2016  . Essential hypertension 05/11/2016  . Osteopenia 10/12/2015  . DOE (dyspnea on exertion) 05/02/2013  . Chronic sinusitis 05/02/2013  . Vertigo 08/14/2012  . Benign essential HTN 08/14/2012  . BMI 28.0-28.9,adult 08/14/2012  . COLONIC POLYPS 01/24/2008  . ACUT GASTR ULCER W/O MENTION HEMORR PERF/OBST 01/24/2008   Past Medical History:  Diagnosis Date  . Allergy   . Anxiety   . Arthritis   . Cancer ([place])   . Dyslipidemia    statin intolerance  . GERD (gastroesophageal reflux disease)   . Heart murmur   . Hyperlipidemia   . Hypertension   . Vertigo    Past Surgical History:  Procedure Laterality Date  . [place] STUDY N/A 09/29/2019   Procedure: [place] STUDY;  Surgeon: Mauri Pole, MD;  [facility];  Service: Endoscopy;  Laterality: N/A;  . ABDOMINAL HYSTERECTOMY  1972   cervical cancer  . BIOPSY  11/24/2019   Procedure: BIOPSY;  Surgeon: Mauri Pole, MD;  Location: [facility];  Service: Endoscopy;;  . BRAVO [place] STUDY N/A 11/24/2019   Procedure: BRAVO [place];  Surgeon: Mauri Pole, MD;  [facility]: [facility];  Service: Endoscopy;  Laterality: N/A;  . COLONOSCOPY    . ESOPHAGEAL MANOMETRY N/A 09/29/2019   Procedure: ESOPHAGEAL MANOMETRY (EM);  Surgeon: Mauri Pole, MD;  [facility];  Service: Endoscopy;  Laterality: N/A;  . ESOPHAGOGASTRODUODENOSCOPY (EGD) WITH PROPOFOL N/A 11/24/2019   Procedure: ESOPHAGOGASTRODUODENOSCOPY (EGD) WITH PROPOFOL;  Surgeon: Mauri Pole, MD;  [facility];  Service: Endoscopy;  Laterality: N/A;  . WISDOM TOOTH EXTRACTION     uppers   Allergies  Allergen Reactions  . Statins     Muscle cramps   Prior to Admission medications   Medication Sig Start Date End  Date Taking? Authorizing Provider  acetaminophen (TYLENOL) 650 MG CR tablet Take 1,300 mg by mouth every 8 (eight) hours as needed for pain.   Yes [provider]  CARAWAY OIL-LEVOMENTHOL PO Take by mouth.   Yes [provider]  Carboxymethylcellul-Glycerin (LUBRICATING EYE DROPS OP) Place 2 drops into both eyes daily.   Yes [provider]  esomeprazole (NEXIUM) 40 MG capsule Take 1 capsule (40 mg total) by mouth daily before breakfast. 12/14/20 03/14/21 Yes Nandigam, Venia Minks, MD  ezetimibe (ZETIA) 10 MG tablet Take 1 tablet (10 mg total) by mouth daily. 07/14/20  Yes Wendie Agreste, MD  fexofenadine (ALLEGRA) 180 MG tablet Take 180 mg by mouth daily.  10/04/13  Yes [provider]  FIBER PO Take by mouth.   Yes [provider]  fluticasone (FLONASE) 50 MCG/ACT nasal spray Place 2 sprays into both nostrils daily. 01/12/20  Yes Wendie Agreste, MD  lisinopril-hydrochlorothiazide (ZESTORETIC) 20-12.5 MG tablet Take 1 tablet by mouth daily. 07/14/20  Yes Wendie Agreste, MD  Multiple Vitamin (MULTIVITAMIN WITH MINERALS) TABS tablet Take 1 tablet by mouth daily.   Yes [provider]  Pitavastatin Calcium (LIVALO) 2 MG TABS Take 1 tablet (2 mg total) by mouth daily. 09/13/20  Yes Hilty, Nadean Corwin, MD  Tiotropium Bromide Monohydrate (SPIRIVA RESPIMAT) 2.5 MCG/ACT AERS Take 2 puffs by mouth daily. 01/12/20  Yes Wendie Agreste, MD  Travoprost, BAK Free, (TRAVATAN) 0.004 % SOLN ophthalmic solution Place 1 drop into both eyes at bedtime.   Yes [provider]  Vitamin D, Ergocalciferol, 50 MCG (2000 UT) CAPS Take by mouth.   Yes [provider]  diclofenac (VOLTAREN) 75 MG EC tablet Take 1 tablet (75 mg total) by mouth 2 (two) times daily. Patient not taking: Reported on 01/10/2021 12/02/20   Posey Boyer, MD   Social History   Socioeconomic History  . Marital status: Married    Spouse name: [name]  . Number of children: 0   . Years of education: 44  . Highest education level: Not on file  Occupational History    Employer: RETIRED    Comment: Retired  Tobacco Use  . Smoking status: Former Smoker    Packs/day: 0.50    Years: 32.00    Pack years: 16.00    Types: Cigarettes    Quit date: 11/27/2002    Years since quitting: 18.1  . Smokeless tobacco: Never Used  Vaping Use  . Vaping Use: Never used  Substance and Sexual Activity  . Alcohol use: Yes    Alcohol/week: 3.0 standard drinks    Types: 3 Standard drinks or equivalent per week    Comment: social - 2 or 3 times a month 1-2 glasses of wine  . Drug use: No  . Sexual activity: Yes    Partners: Male  Other Topics Concern  . Not  on file  Social History Narrative   Exercise 3 to 4 times/week walking for 45 min -1 hour   Patient lives at home with her husband Legrand Como).   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   Right handed.   decaffeine Green Tea.   Social Determinants of Health   Financial Resource Strain: Not on file  Food Insecurity: Not on file  Transportation Needs: Not on file  Physical Activity: Not on file  Stress: Not on file  Social Connections: Not on file  Intimate Partner Violence: Not on file    Review of Systems  Constitutional: Negative for fatigue and unexpected weight change.  Respiratory: Negative for chest tightness and shortness of breath.   Cardiovascular: Negative for chest pain, palpitations and leg swelling.  Gastrointestinal: Negative for abdominal pain and blood in stool.  Neurological: Negative for dizziness, syncope, light-headedness and headaches.     Objective:   Vitals:   01/10/21 0831 01/10/21 0835  BP: (!) 151/87 (!) 142/76  Pulse: 97   Temp: 98.3 F (36.8 C)   TempSrc: Temporal   SpO2: 99%   Weight: 137 lb (62.1 kg)   Height: 5' 3.5" (1.613 m)      Physical Exam Vitals reviewed.   Constitutional:      Appearance: She is well-developed and well-nourished.  HENT:     Head: Normocephalic and atraumatic.  Eyes:     Extraocular Movements: EOM normal.     Conjunctiva/sclera: Conjunctivae normal.     Pupils: Pupils are equal, round, and reactive to light.  Neck:     Vascular: No carotid bruit.  Cardiovascular:     Rate and Rhythm: Normal rate and regular rhythm.     Pulses: Intact distal pulses.     Heart sounds: Normal heart sounds.  Pulmonary:     Effort: Pulmonary effort is normal.     Breath sounds: Normal breath sounds.  Abdominal:     Palpations: Abdomen is soft. There is no pulsatile mass.     Tenderness: There is no abdominal tenderness.  Skin:    General: Skin is warm and dry.  Neurological:     Mental Status: She is alert and oriented to person, place, and time.  Psychiatric:        Mood and Affect: Mood and affect normal.        Behavior: Behavior normal.        Assessment & Plan:  Breanna Sharp is a 70 y.o. female . Essential hypertension - Plan: Comprehensive metabolic panel, lisinopril-hydrochlorothiazide (ZESTORETIC) 20-12.5 MG tablet  -Borderline elevated in office, home readings much lower.  Continue same regimen for now.  39-month follow-up with myself, and keep routine follow-up with cardiology.  Hyperlipidemia, unspecified hyperlipidemia type - Plan: Comprehensive metabolic panel  -Recent lipid panel, managed by cardiology.  No med changes.  Chronic obstructive pulmonary disease, unspecified COPD type ([place]) - Plan: Tiotropium Bromide Monohydrate (SPIRIVA RESPIMAT) 2.5 MCG/ACT AERS  -Reports stable control with Spiriva.  Continue same.  Allergic rhinitis due to pollen, unspecified seasonality - Plan: fluticasone (FLONASE) 50 MCG/ACT nasal spray  -Stable with Flonase, refilled  Prediabetes - Plan: Hemoglobin A1c  -Weight stable, most recent A1c's have been stable.  Will check again today, option of yearly check if normal.  Meds  ordered this encounter  Medications  . lisinopril-hydrochlorothiazide (ZESTORETIC) 20-12.5 MG tablet    Sig: Take 1 tablet by  mouth daily.    Dispense:  90 tablet    Refill:  2  . Tiotropium Bromide Monohydrate (SPIRIVA RESPIMAT) 2.5 MCG/ACT AERS    Sig: Take 2 puffs by mouth daily.    Dispense:  12 g    Refill:  6  . fluticasone (FLONASE) 50 MCG/ACT nasal spray    Sig: Place 2 sprays into both nostrils daily.    Dispense:  48 g    Refill:  6   Patient Instructions    Based on your home readings I will leave the medications the same.  Follow-up in 6 months.  Let me know if there are questions in the meantime and take care.    If you have lab work done today you will be contacted with your lab results within the next 2 weeks.  If you have not heard from [place] then please contact us. The fastest way to get your results is to register for My Chart.   IF you received an x-ray today, you will receive an invoice from [facility]. Please contact [facility] at [phone] with questions or concerns regarding your invoice.   IF you received labwork today, you will receive an invoice from [facility]. Please contact [facility] at [phone] with questions or concerns regarding your invoice.   Our billing staff will not be able to assist you with questions regarding bills from these companies.  You will be contacted with the lab results as soon as they are available. The fastest way to get your results is to activate your My Chart account. Instructions are located on the last page of this paperwork. If you have not heard from [place] regarding the results in 2 weeks, please contact this office.         Signed, [name], MD [facility]

## 2021-01-10 NOTE — Patient Instructions (Addendum)
  Based on your home readings I will leave the medications the same.  Follow-up in 6 months.  Let me know if there are questions in the meantime and take care.    If you have lab work done today you will be contacted with your lab results within the next 2 weeks.  If you have not heard from [place] then please contact us. The fastest way to get your results is to register for My Chart.   IF you received an x-ray today, you will receive an invoice from [facility]. Please contact [facility] at [phone] with questions or concerns regarding your invoice.   IF you received labwork today, you will receive an invoice from [facility]. Please contact [facility] at [phone] with questions or concerns regarding your invoice.   Our billing staff will not be able to assist you with questions regarding bills from these companies.  You will be contacted with the lab results as soon as they are available. The fastest way to get your results is to activate your My Chart account. Instructions are located on the last page of this paperwork. If you have not heard from [place] regarding the results in 2 weeks, please contact this office.

## 2021-01-11 LAB — COMPREHENSIVE METABOLIC PANEL
ALT: 18 IU/L (ref 0–32)
AST: 22 IU/L (ref 0–40)
Albumin/Globulin Ratio: 2.3 — ABNORMAL HIGH (ref 1.2–2.2)
Albumin: 4.6 g/dL (ref 3.8–4.8)
Alkaline Phosphatase: 84 IU/L (ref 44–121)
BUN/Creatinine Ratio: 14 (ref 12–28)
BUN: 12 mg/dL (ref 8–27)
Bilirubin Total: 0.4 mg/dL (ref 0.0–1.2)
CO2: 24 mmol/L (ref 20–29)
Calcium: 10 mg/dL (ref 8.7–10.3)
Chloride: 100 mmol/L (ref 96–106)
Creatinine, Ser: 0.85 mg/dL (ref 0.57–1.00)
GFR calc Af Amer: 80 mL/min/{1.73_m2} (ref >59)
GFR calc non Af Amer: 70 mL/min/{1.73_m2} (ref >59)
Globulin, Total: 2 g/dL (ref 1.5–4.5)
Glucose: 106 mg/dL — ABNORMAL HIGH (ref 65–99)
Potassium: 4 mmol/L (ref 3.5–5.2)
Sodium: 139 mmol/L (ref 134–144)
Total Protein: 6.6 g/dL (ref 6.0–8.5)

## 2021-01-11 LAB — HEMOGLOBIN A1C
Est. average glucose Bld gHb Est-mCnc: 114 mg/dL
Hgb A1c MFr Bld: 5.6 % (ref 4.8–5.6)

## 2021-04-26 DIAGNOSIS — H6123 Impacted cerumen, bilateral: Secondary | ICD-10-CM | POA: Diagnosis not present

## 2021-06-28 DIAGNOSIS — H2513 Age-related nuclear cataract, bilateral: Secondary | ICD-10-CM | POA: Diagnosis not present

## 2021-06-28 DIAGNOSIS — H18413 Arcus senilis, bilateral: Secondary | ICD-10-CM | POA: Diagnosis not present

## 2021-06-28 DIAGNOSIS — H401131 Primary open-angle glaucoma, bilateral, mild stage: Secondary | ICD-10-CM | POA: Diagnosis not present

## 2021-06-28 DIAGNOSIS — H25013 Cortical age-related cataract, bilateral: Secondary | ICD-10-CM | POA: Diagnosis not present

## 2021-07-08 DIAGNOSIS — Z20822 Contact with and (suspected) exposure to covid-19: Secondary | ICD-10-CM | POA: Diagnosis not present

## 2021-07-12 ENCOUNTER — Other Ambulatory Visit: Payer: Self-pay | Admitting: Family Medicine

## 2021-07-12 DIAGNOSIS — Z1231 Encounter for screening mammogram for malignant neoplasm of breast: Secondary | ICD-10-CM

## 2021-07-14 ENCOUNTER — Other Ambulatory Visit: Payer: Self-pay

## 2021-07-14 ENCOUNTER — Ambulatory Visit (INDEPENDENT_AMBULATORY_CARE_PROVIDER_SITE_OTHER): Payer: Medicare Other | Admitting: Family Medicine

## 2021-07-14 ENCOUNTER — Encounter: Payer: Self-pay | Admitting: Family Medicine

## 2021-07-14 VITALS — BP 148/78 | HR 92 | Temp 97.1°F | Ht 63.0 in | Wt 135.6 lb

## 2021-07-14 DIAGNOSIS — R739 Hyperglycemia, unspecified: Secondary | ICD-10-CM | POA: Diagnosis not present

## 2021-07-14 DIAGNOSIS — E785 Hyperlipidemia, unspecified: Secondary | ICD-10-CM

## 2021-07-14 DIAGNOSIS — I10 Essential (primary) hypertension: Secondary | ICD-10-CM | POA: Diagnosis not present

## 2021-07-14 DIAGNOSIS — E559 Vitamin D deficiency, unspecified: Secondary | ICD-10-CM | POA: Insufficient documentation

## 2021-07-14 LAB — URINALYSIS, ROUTINE W REFLEX MICROSCOPIC
Bilirubin Urine: NEGATIVE
Hgb urine dipstick: NEGATIVE
Ketones, ur: NEGATIVE
Leukocytes,Ua: NEGATIVE
Nitrite: NEGATIVE
RBC / HPF: NONE SEEN (ref 0–?)
Specific Gravity, Urine: 1.015 (ref 1.000–1.030)
Total Protein, Urine: NEGATIVE
Urine Glucose: NEGATIVE
Urobilinogen, UA: 0.2 (ref 0.0–1.0)
WBC, UA: NONE SEEN (ref 0–?)
pH: 6 (ref 5.0–8.0)

## 2021-07-14 LAB — CBC
HCT: 39 % (ref 36.0–46.0)
Hemoglobin: 12.6 g/dL (ref 12.0–15.0)
MCHC: 32.4 g/dL (ref 30.0–36.0)
MCV: 86.6 fl (ref 78.0–100.0)
Platelets: 242 10*3/uL (ref 150.0–400.0)
RBC: 4.51 Mil/uL (ref 3.87–5.11)
RDW: 12.9 % (ref 11.5–15.5)
WBC: 4.7 10*3/uL (ref 4.0–10.5)

## 2021-07-14 LAB — COMPREHENSIVE METABOLIC PANEL
ALT: 25 U/L (ref 0–35)
AST: 23 U/L (ref 0–37)
Albumin: 4.3 g/dL (ref 3.5–5.2)
Alkaline Phosphatase: 78 U/L (ref 39–117)
BUN: 15 mg/dL (ref 6–23)
CO2: 28 mEq/L (ref 19–32)
Calcium: 10.4 mg/dL (ref 8.4–10.5)
Chloride: 103 mEq/L (ref 96–112)
Creatinine, Ser: 0.84 mg/dL (ref 0.40–1.20)
GFR: 69.97 mL/min (ref >60.00)
Glucose, Bld: 108 mg/dL — ABNORMAL HIGH (ref 70–99)
Potassium: 4 mEq/L (ref 3.5–5.1)
Sodium: 139 mEq/L (ref 135–145)
Total Bilirubin: 0.6 mg/dL (ref 0.2–1.2)
Total Protein: 6.8 g/dL (ref 6.0–8.3)

## 2021-07-14 LAB — HEMOGLOBIN A1C: Hgb A1c MFr Bld: 5.6 % (ref 4.6–6.5)

## 2021-07-14 LAB — LIPID PANEL
Cholesterol: 199 mg/dL (ref 0–200)
HDL: 57.5 mg/dL (ref >39.00)
LDL Cholesterol: 117 mg/dL — ABNORMAL HIGH (ref 0–99)
NonHDL: 141.51
Total CHOL/HDL Ratio: 3
Triglycerides: 122 mg/dL (ref 0.0–149.0)
VLDL: 24.4 mg/dL (ref 0.0–40.0)

## 2021-07-14 LAB — VITAMIN D 25 HYDROXY (VIT D DEFICIENCY, FRACTURES): VITD: 43.5 ng/mL (ref 30.00–100.00)

## 2021-07-14 NOTE — Progress Notes (Signed)
Established Patient Office Visit  Subjective:  Patient ID: Breanna Sharp, female    DOB: 02-Feb-1951  Age: 70 y.o. MRN: [id number]  CC:  Chief Complaint  Patient presents with   Transitions Of Care    TOC from Dr. [name] refill on medications.     HPI Breanna Sharp presents for establishment of care by way of transfer and follow-up of hypertension, elevated cholesterol GERD and vitamin D deficiency.  Takes 2000 units of vitamin D daily.  GERD is controlled with Nexium 40 mg daily.  Control of cholesterol has been difficult for her.  Multiple statins have given her myalgias but she seems to be tolerating Livalo.  Tells me that it was increased from 1 mg to 2 mg.  She continues with Zetia without issue.  She has never had a heart attack or stroke.  Blood pressure is controlled with lisinopril and HCTZ.  Blood pressure at home with consistent checks runs in the 1 10-1 30 over 70-80.  She has brought her cuff into the clinic on prior occasions and it has compared to the clinic cuff.  Retired and lives with her husband.  She walks 5 days weekly.  She quit smoking 20 years ago.  Past Medical History:  Diagnosis Date   Allergy    Anxiety    Arthritis    Cancer ([place])    Dyslipidemia    statin intolerance   GERD (gastroesophageal reflux disease)    Heart murmur    Hyperlipidemia    Hypertension    Vertigo     Past Surgical History:  Procedure Laterality Date   36 HOUR [place] STUDY N/A 09/29/2019   Procedure: 24 HOUR PH STUDY;  Surgeon: Mauri Pole, MD;  Location: [facility];  Service: Endoscopy;  Laterality: N/A;   ABDOMINAL HYSTERECTOMY  1972   cervical cancer   BIOPSY  11/24/2019   Procedure: BIOPSY;  Surgeon: Mauri Pole, MD;  Location: [facility];  Service: Endoscopy;;   BRAVO [place] STUDY N/A 11/24/2019   Procedure: BRAVO [place];  Surgeon: Mauri Pole, MD;  [facility];  Service: Endoscopy;  Laterality: N/A;   COLONOSCOPY     ESOPHAGEAL  MANOMETRY N/A 09/29/2019   Procedure: ESOPHAGEAL MANOMETRY (EM);  Surgeon: Mauri Pole, MD;  [facility];  Service: Endoscopy;  Laterality: N/A;   ESOPHAGOGASTRODUODENOSCOPY (EGD) WITH PROPOFOL N/A 11/24/2019   Procedure: ESOPHAGOGASTRODUODENOSCOPY (EGD) WITH PROPOFOL;  Surgeon: Mauri Pole, MD;  [facility];  Service: Endoscopy;  Laterality: N/A;   WISDOM TOOTH EXTRACTION     uppers    Family History  Problem Relation Age of Onset   Hypertension Mother        dx in her 31's   Heart attack Mother 67   Breast cancer Mother        unsure of age   Heart attack Brother 86   Diabetes Brother    Hyperlipidemia Brother    Hypertension Brother    Heart disease Brother    Colon cancer Neg Hx    Esophageal cancer Neg Hx    Rectal cancer Neg Hx    Stomach cancer Neg Hx     Social History   Socioeconomic History   Marital status: Married    Spouse name: Breanna Sharp   Number of children: 0   Years of education: 12   Highest education level: Not on file  Occupational History    Employer: RETIRED    Comment: Retired  Tobacco Use   Smoking status: Former    Packs/day: 0.50    Years: 32.00    Pack years: 16.00    Types: Cigarettes    Quit date: 11/27/2002    Years since quitting: 18.6   Smokeless tobacco: Never  Vaping Use   Vaping Use: Never used  Substance and Sexual Activity   Alcohol use: Yes    Alcohol/week: 3.0 standard drinks    Types: 3 Standard drinks or equivalent per week    Comment: social - 2 or 3 times a month 1-2 glasses of wine   Drug use: No   Sexual activity: Yes    Partners: Male  Other Topics Concern   Not on file  Social History Narrative   Exercise 3 to 4 times/week walking for 45 min -1 hour   Patient lives at home with her husband Legrand [place]).   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   Right handed.   decaffeine Green Tea.    Social Determinants of Health   Financial Resource Strain: Not on file  Food Insecurity: Not on file  Transportation Needs: Not on file  Physical Activity: Not on file  Stress: Not on file  Social Connections: Not on file  Intimate Partner Violence: Not on file    Outpatient Medications Prior to Visit  Medication Sig Dispense Refill   acetaminophen (TYLENOL) 650 MG CR tablet Take 1,300 mg by mouth every 8 (eight) hours as needed for pain.     Carboxymethylcellul-Glycerin (LUBRICATING EYE DROPS OP) Place 2 drops into both eyes daily.     esomeprazole (NEXIUM) 40 MG capsule Take 1 capsule (40 mg total) by mouth daily before breakfast. 90 capsule 3   ezetimibe (ZETIA) 10 MG tablet Take 1 tablet (10 mg total) by mouth daily. 90 tablet 3   fexofenadine (ALLEGRA) 180 MG tablet Take 180 mg by mouth daily.      FIBER PO Take by mouth.     fluticasone (FLONASE) 50 MCG/ACT nasal spray Place 2 sprays into both nostrils daily. 48 g 6   lisinopril-hydrochlorothiazide (ZESTORETIC) 20-12.5 MG tablet Take 1 tablet by mouth daily. 90 tablet 2   Multiple Vitamin (MULTIVITAMIN WITH MINERALS) TABS tablet Take 1 tablet by mouth daily.     Netarsudil-Latanoprost (ROCKLATAN) 0.02-0.005 % SOLN Apply to eye.     Pitavastatin Calcium (LIVALO) 2 MG TABS Take 1 tablet (2 mg total) by mouth daily. 90 tablet 3   Tiotropium Bromide Monohydrate (SPIRIVA RESPIMAT) 2.5 MCG/ACT AERS Take 2 puffs by mouth daily. 12 g 6   Vitamin D, Ergocalciferol, 50 MCG (2000 UT) CAPS Take by mouth.     Travoprost, BAK Free, (TRAVATAN) 0.004 % SOLN ophthalmic solution Place 1 drop into both eyes at bedtime.     CARAWAY OIL-LEVOMENTHOL PO Take by mouth.     No facility-administered medications prior to visit.    Allergies  Allergen Reactions   Statins     Muscle cramps    ROS Review of Systems  Constitutional:  Negative for diaphoresis, fatigue, fever and unexpected weight change.  HENT: Negative.    Eyes:  Negative for  photophobia and visual disturbance.  Respiratory: Negative.    Cardiovascular: Negative.   Gastrointestinal: Negative.  Negative for anal bleeding, blood in stool and constipation.  Endocrine: Negative for polyphagia and polyuria.  Genitourinary:  Negative for difficulty urinating, dysuria, frequency and urgency.  Musculoskeletal:  Negative for gait problem and joint swelling.  Skin: Negative.   Neurological:  Negative for weakness, light-headedness and headaches.  Psychiatric/Behavioral: Negative.       Objective:    Physical Exam  BP (!) 148/78 (BP Location: Left Arm, Patient Position: Sitting, Cuff Size: Normal)   Pulse 92   Temp (!) 97.1 F (36.2 C) (Temporal)   Ht '5\' 3"'$  (1.6 m)   Wt 135 lb 9.6 oz (61.5 kg)   SpO2 97%   BMI 24.02 kg/m  Wt Readings from Last 3 Encounters:  07/14/21 135 lb 9.6 oz (61.5 kg)  01/10/21 137 lb (62.1 kg)  12/02/20 137 lb 12.8 oz (62.5 kg)     Health Maintenance Due  Topic Date Due   Zoster Vaccines- Shingrix (1 of 2) Never done   INFLUENZA VACCINE  06/27/2021    There are no preventive care reminders to display for this patient.  Lab Results  Component Value Date   TSH 2.390 12/03/2017   Lab Results  Component Value Date   WBC 5.2 09/01/2015   HGB 12.2 09/01/2015   HCT 37.2 09/01/2015   MCV 84.9 09/01/2015   PLT 235 09/01/2015   Lab Results  Component Value Date   NA 139 01/10/2021   K 4.0 01/10/2021   CO2 24 01/10/2021   GLUCOSE 106 (H) 01/10/2021   BUN 12 01/10/2021   CREATININE 0.85 01/10/2021   BILITOT 0.4 01/10/2021   ALKPHOS 84 01/10/2021   AST 22 01/10/2021   ALT 18 01/10/2021   PROT 6.6 01/10/2021   ALBUMIN 4.6 01/10/2021   CALCIUM 10.0 01/10/2021   Lab Results  Component Value Date   CHOL 173 12/16/2020   Lab Results  Component Value Date   HDL 60 12/16/2020   Lab Results  Component Value Date   LDLCALC 90 12/16/2020   Lab Results  Component Value Date   TRIG 134 12/16/2020   Lab Results   Component Value Date   CHOLHDL 2.9 12/16/2020   Lab Results  Component Value Date   HGBA1C 5.6 01/10/2021      Assessment & Plan:   Problem List Items Addressed This Visit       Cardiovascular and Mediastinum   Essential hypertension - Primary   Relevant Orders   Comprehensive metabolic panel   CBC   Urinalysis, Routine w reflex microscopic     Other   Hyperlipidemia   Relevant Orders   Comprehensive metabolic panel   Lipid panel   Hyperglycemia   Relevant Orders   Comprehensive metabolic panel   Hemoglobin A1c   Vitamin D deficiency   Relevant Orders   VITAMIN D 25 Hydroxy (Vit-D Deficiency, Fractures)    No orders of the defined types were placed in this encounter.   Follow-up: Return in about 6 months (around 01/14/2022).  Given information on managing elevated cholesterol and hypertension.  Continue current meds.  We will adjust Livalo as needed.  Advised that she have the Shingrix vaccine series and she was given information about this.  She also has follow-up with Dr. Debara Pickett for oversight of her lipids.   Libby Maw, MD

## 2021-07-29 ENCOUNTER — Other Ambulatory Visit: Payer: Self-pay

## 2021-07-29 MED ORDER — EZETIMIBE 10 MG PO TABS: 10.0000 mg | ORAL_TABLET | Freq: Every day | ORAL | 3 refills | Status: DC

## 2021-07-29 NOTE — Telephone Encounter (Signed)
Refill request for pending Rx last OV and TOC 07/14/21 last refill 07/14/20. Please advise

## 2021-08-07 ENCOUNTER — Other Ambulatory Visit: Payer: Self-pay | Admitting: Internal Medicine

## 2021-08-17 ENCOUNTER — Ambulatory Visit (INDEPENDENT_AMBULATORY_CARE_PROVIDER_SITE_OTHER): Payer: Medicare Other

## 2021-08-17 ENCOUNTER — Encounter: Payer: Self-pay | Admitting: Nurse Practitioner

## 2021-08-17 ENCOUNTER — Other Ambulatory Visit: Payer: Self-pay

## 2021-08-17 DIAGNOSIS — Z23 Encounter for immunization: Secondary | ICD-10-CM | POA: Diagnosis not present

## 2021-08-17 NOTE — Progress Notes (Addendum)
After obtaining consent, and per orders of Dr. Ethelene Hal, injection of High dose flu vaccine given by Lynda Rainwater. Patient instructed to remain in clinic for 20 minutes afterwards, and to report any adverse reaction to me immediately.

## 2021-08-28 DIAGNOSIS — Z20822 Contact with and (suspected) exposure to covid-19: Secondary | ICD-10-CM | POA: Diagnosis not present

## 2021-08-30 ENCOUNTER — Encounter: Payer: Self-pay | Admitting: Family Medicine

## 2021-08-30 ENCOUNTER — Telehealth (INDEPENDENT_AMBULATORY_CARE_PROVIDER_SITE_OTHER): Payer: Medicare Other | Admitting: Family Medicine

## 2021-08-30 VITALS — BP 129/78 | Temp 98.7°F

## 2021-08-30 DIAGNOSIS — U071 COVID-19: Secondary | ICD-10-CM

## 2021-08-30 NOTE — Progress Notes (Signed)
Virtual Visit via Video Note  I connected with Breanna Sharp  on 08/30/21 at 10:20 AM EDT by a video enabled telemedicine application and verified that I am speaking with the correct person using two identifiers.  Location patient: home, [place] Location provider:work or home office Persons participating in the virtual visit: patient, provider  I discussed the limitations of evaluation and management by telemedicine and the availability of in person appointments. The patient expressed understanding and agreed to proceed.   HPI:  Acute telemedicine visit for Covid19: -Onset: 8 days ago; tested positive for covid19 2 days ago -Symptoms include: nasal congestion, sore throat, chills -doing better except has has a stuffy nose -Denies:CP, SOB, NVD, fevers, inability to eat/drink/get out of bed -Has tried:tylenol -Pertinent past medical history:see below -Pertinent medication allergies:  Allergies  Allergen Reactions   Statins     Muscle cramps  -COVID-19 vaccine status: had 2 doses and 2 boosters  ROS: See pertinent positives and negatives per HPI.  Past Medical History:  Diagnosis Date   Allergy    Anxiety    Arthritis    Cancer ([place])    Dyslipidemia    statin intolerance   GERD (gastroesophageal reflux disease)    Heart murmur    Hyperlipidemia    Hypertension    Vertigo     Past Surgical History:  Procedure Laterality Date   39 HOUR [place] STUDY N/A 09/29/2019   Procedure: 24 HOUR PH STUDY;  Surgeon: Mauri Pole, MD;  [facility];  Service: Endoscopy;  Laterality: N/A;   ABDOMINAL HYSTERECTOMY  1972   cervical cancer   BIOPSY  11/24/2019   Procedure: BIOPSY;  Surgeon: Mauri Pole, MD;  [facility];  Service: Endoscopy;;   BRAVO [place] STUDY N/A 11/24/2019   Procedure: BRAVO [place];  Surgeon: Mauri Pole, MD;  [facility];  Service: Endoscopy;  Laterality: N/A;   COLONOSCOPY     ESOPHAGEAL MANOMETRY N/A 09/29/2019   Procedure:  ESOPHAGEAL MANOMETRY (EM);  Surgeon: Mauri Pole, MD;  [facility];  Service: Endoscopy;  Laterality: N/A;   ESOPHAGOGASTRODUODENOSCOPY (EGD) WITH PROPOFOL N/A 11/24/2019   Procedure: ESOPHAGOGASTRODUODENOSCOPY (EGD) WITH PROPOFOL;  Surgeon: Mauri Pole, MD;  [facility];  Service: Endoscopy;  Laterality: N/A;   WISDOM TOOTH EXTRACTION     uppers     Current Outpatient Medications:    acetaminophen (TYLENOL) 650 MG CR tablet, Take 1,300 mg by mouth every 8 (eight) hours as needed for pain., Disp: , Rfl:    Carboxymethylcellul-Glycerin (LUBRICATING EYE DROPS OP), Place 2 drops into both eyes daily., Disp: , Rfl:    ezetimibe (ZETIA) 10 MG tablet, Take 1 tablet (10 mg total) by mouth daily., Disp: 90 tablet, Rfl: 3   fexofenadine (ALLEGRA) 180 MG tablet, Take 180 mg by mouth daily. , Disp: , Rfl:    FIBER PO, Take by mouth., Disp: , Rfl:    fluticasone (FLONASE) 50 MCG/ACT nasal spray, Place 2 sprays into both nostrils daily., Disp: 48 g, Rfl: 6   lisinopril-hydrochlorothiazide (ZESTORETIC) 20-12.5 MG tablet, Take 1 tablet by mouth daily., Disp: 90 tablet, Rfl: 2   LIVALO 2 MG TABS, TAKE 1 TABLET DAILY, Disp: 90 tablet, Rfl: 3   Multiple Vitamin (MULTIVITAMIN WITH MINERALS) TABS tablet, Take 1 tablet by mouth daily., Disp: , Rfl:    Netarsudil-Latanoprost (ROCKLATAN) 0.02-0.005 % SOLN, Apply to eye., Disp: , Rfl:    Tiotropium Bromide Monohydrate (SPIRIVA RESPIMAT) 2.5 MCG/ACT AERS, Take 2  puffs by mouth daily., Disp: 12 g, Rfl: 6   esomeprazole (NEXIUM) 40 MG capsule, Take 1 capsule (40 mg total) by mouth daily before breakfast. (Patient taking differently: Take 40 mg by mouth as needed.), Disp: 90 capsule, Rfl: 3  EXAM:  VITALS per patient if applicable:  GENERAL: alert, oriented, appears well and in no acute distress  HEENT: atraumatic, conjunttiva clear, no obvious abnormalities on inspection of external nose and ears  NECK: normal movements of  the head and neck  LUNGS: on inspection no signs of respiratory distress, breathing rate appears normal, no obvious gross SOB, gasping or wheezing  CV: no obvious cyanosis  MS: moves all visible extremities without noticeable abnormality  PSYCH/NEURO: pleasant and cooperative, no obvious depression or anxiety, speech and thought processing grossly intact  ASSESSMENT AND PLAN:  Discussed the following assessment and plan:  COVID-19  -we discussed possible serious and likely etiologies, options for evaluation and workup, limitations of telemedicine visit vs in person visit, treatment, treatment risks and precautions. Pt is agreeable to treatment via telemedicine at this moment. Seems is improving from a mild (thus far) case of covid. Opted to try nasal saline and a short course of nasal decongestant for the nasal symptoms. Discussed length of possible contagious period and answered questions. Discussed potential complications and precautions.  Advised to seek prompt in person care if worsening, new symptoms arise, or if is not improving with treatment. Discussed options for inperson care if PCP office not available. Did let this patient know that I only do telemedicine on Tuesdays and Thursdays for [place]. Advised to schedule follow up visit with PCP or [facility] if any further questions or concerns to avoid delays in care.   I discussed the assessment and treatment plan with the patient. The patient was provided an opportunity to ask questions and all were answered. The patient agreed with the plan and demonstrated an understanding of the instructions.     Lucretia Kern, DO

## 2021-08-30 NOTE — Patient Instructions (Signed)
-  nasal saline twice daily  -can try Afrin nasal spray for 3 days.  I hope you are feeling better soon!  Seek in person care promptly if your symptoms worsen, new concerns arise or you are not improving with treatment.  It was nice to meet you today. I help [facility] out with telemedicine visits on Tuesdays and Thursdays and am available for visits on those days. If you have any concerns or questions following this visit please schedule a follow up visit with your Primary Care doctor or seek care at a local urgent care clinic to avoid delays in care.

## 2021-09-05 DIAGNOSIS — Z20822 Contact with and (suspected) exposure to covid-19: Secondary | ICD-10-CM | POA: Diagnosis not present

## 2021-09-12 ENCOUNTER — Ambulatory Visit
Admission: RE | Admit: 2021-09-12 | Discharge: 2021-09-12 | Disposition: A | Discharge status: Home / Self Care | Payer: Medicare Other | Source: Ambulatory Visit | Attending: Family Medicine | Admitting: Family Medicine

## 2021-09-12 ENCOUNTER — Other Ambulatory Visit: Payer: Self-pay

## 2021-09-12 DIAGNOSIS — Z1231 Encounter for screening mammogram for malignant neoplasm of breast: Secondary | ICD-10-CM

## 2021-09-14 DIAGNOSIS — Z20822 Contact with and (suspected) exposure to covid-19: Secondary | ICD-10-CM | POA: Diagnosis not present

## 2021-09-20 ENCOUNTER — Other Ambulatory Visit: Payer: Self-pay

## 2021-09-20 ENCOUNTER — Ambulatory Visit
Admission: RE | Admit: 2021-09-20 | Discharge: 2021-09-20 | Disposition: A | Discharge status: Home / Self Care | Payer: Medicare Other | Source: Ambulatory Visit | Attending: Internal Medicine | Admitting: Internal Medicine

## 2021-09-20 DIAGNOSIS — Z87891 Personal history of nicotine dependence: Secondary | ICD-10-CM

## 2021-09-20 DIAGNOSIS — J449 Chronic obstructive pulmonary disease, unspecified: Secondary | ICD-10-CM | POA: Diagnosis not present

## 2021-09-20 DIAGNOSIS — I700 Atherosclerosis of aorta: Secondary | ICD-10-CM | POA: Diagnosis not present

## 2021-09-20 DIAGNOSIS — F17200 Nicotine dependence, unspecified, uncomplicated: Secondary | ICD-10-CM

## 2021-09-20 DIAGNOSIS — J439 Emphysema, unspecified: Secondary | ICD-10-CM | POA: Diagnosis not present

## 2021-09-27 DIAGNOSIS — Z779 Other contact with and (suspected) exposures hazardous to health: Secondary | ICD-10-CM | POA: Diagnosis not present

## 2021-09-27 DIAGNOSIS — N870 Mild cervical dysplasia: Secondary | ICD-10-CM | POA: Diagnosis not present

## 2021-09-27 DIAGNOSIS — C55 Malignant neoplasm of uterus, part unspecified: Secondary | ICD-10-CM | POA: Diagnosis not present

## 2021-09-27 DIAGNOSIS — Z01419 Encounter for gynecological examination (general) (routine) without abnormal findings: Secondary | ICD-10-CM | POA: Diagnosis not present

## 2021-09-27 DIAGNOSIS — Z6823 Body mass index (BMI) 23.0-23.9, adult: Secondary | ICD-10-CM | POA: Diagnosis not present

## 2021-09-29 DIAGNOSIS — Z20822 Contact with and (suspected) exposure to covid-19: Secondary | ICD-10-CM | POA: Diagnosis not present

## 2021-10-13 DIAGNOSIS — H6123 Impacted cerumen, bilateral: Secondary | ICD-10-CM | POA: Diagnosis not present

## 2021-11-18 ENCOUNTER — Other Ambulatory Visit: Payer: Self-pay

## 2021-11-18 DIAGNOSIS — I10 Essential (primary) hypertension: Secondary | ICD-10-CM

## 2021-11-18 MED ORDER — LISINOPRIL-HYDROCHLOROTHIAZIDE 20-12.5 MG PO TABS: 1.0000 | ORAL_TABLET | Freq: Every day | ORAL | 2 refills | Status: DC

## 2021-11-23 ENCOUNTER — Other Ambulatory Visit: Payer: Self-pay

## 2021-12-15 ENCOUNTER — Other Ambulatory Visit: Payer: Self-pay | Admitting: Gastroenterology

## 2021-12-16 ENCOUNTER — Telehealth: Payer: Self-pay | Admitting: Internal Medicine

## 2021-12-16 ENCOUNTER — Ambulatory Visit (INDEPENDENT_AMBULATORY_CARE_PROVIDER_SITE_OTHER): Payer: Medicare Other | Admitting: Internal Medicine

## 2021-12-16 ENCOUNTER — Encounter: Payer: Self-pay | Admitting: Internal Medicine

## 2021-12-16 ENCOUNTER — Other Ambulatory Visit: Payer: Self-pay

## 2021-12-16 VITALS — BP 160/82 | HR 79 | Ht 63.0 in | Wt 136.0 lb

## 2021-12-16 DIAGNOSIS — M791 Myalgia, unspecified site: Secondary | ICD-10-CM | POA: Diagnosis not present

## 2021-12-16 DIAGNOSIS — I10 Essential (primary) hypertension: Secondary | ICD-10-CM

## 2021-12-16 DIAGNOSIS — E785 Hyperlipidemia, unspecified: Secondary | ICD-10-CM | POA: Diagnosis not present

## 2021-12-16 DIAGNOSIS — I2510 Atherosclerotic heart disease of native coronary artery without angina pectoris: Secondary | ICD-10-CM | POA: Diagnosis not present

## 2021-12-16 DIAGNOSIS — I2584 Coronary atherosclerosis due to calcified coronary lesion: Secondary | ICD-10-CM

## 2021-12-16 DIAGNOSIS — T466X5A Adverse effect of antihyperlipidemic and antiarteriosclerotic drugs, initial encounter: Secondary | ICD-10-CM

## 2021-12-16 NOTE — Patient Instructions (Signed)
Medication Instructions:   STOP pitavastatin (livalo)   Cholesterol medication option(s) Inclisarin -- Leqvio -- administered at 1st dose, 3 months later you get 2nd dose, and then every 6 months after that    *If you need a refill on your cardiac medications before your next appointment, please call your pharmacy*   Lab Work: Fasting lab work to check cholesterol in 6 months  If you have labs (blood work) drawn today and your tests are completely normal, you will receive your results only by: [place] (if you have MyChart) OR A paper copy in the mail If you have any lab test that is abnormal or we need to change your treatment, we will call you to review the results.   Testing/Procedures: NONE   Follow-Up: At [facility], you and your health needs are our priority.  As part of our continuing mission to provide you with exceptional heart care, we have created designated Provider Care Teams.  These Care Teams include your primary Cardiologist (physician) and Advanced Practice Providers (APPs -  Physician Assistants and Nurse Practitioners) who all work together to provide you with the care you need, when you need it.  We recommend signing up for the patient portal called "MyChart".  Sign up information is provided on this After Visit Summary.  MyChart is used to connect with patients for Virtual Visits (Telemedicine).  Patients are able to view lab/test results, encounter notes, upcoming appointments, etc.  Non-urgent messages can be sent to your provider as well.   To learn more about what you can do with MyChart, go to [email or web address].    Your next appointment:   6 months with Dr. Debara Pickett

## 2021-12-16 NOTE — Telephone Encounter (Signed)
Leqvio benefits investigation form faxed to [phone]

## 2021-12-16 NOTE — Progress Notes (Signed)
OFFICE NOTE  Chief Complaint:  Routine follow-up  Primary Care Physician: Libby Maw, MD  HPI:  Breanna Sharp is a pleasant 71 year old female with a history of hypertension and dyslipidemia. Unfortunately she's been intolerant to statins in the past and has failed both Lipitor Zocor Crestor and pravastatin. She is reported some increasing shortness of breath mostly when walking upstairs, however has been struggling with recurrent sinusitis and upper respiratory symptoms. Based on her risk factors, recommended metabolic testing. She underwent cardiopulmonary testing on 04/22/2013. She had maximal effort of 1.08 our ER. Peak VO2 was 108% predicted. Heart rate was 94% predicted. Her heart rate in view to curves were essentially normal with late flattening of her view to curve. Overall the study is low risk. She also went underwent palmar he function testing which showed normal diffusion, volume and flow loops. At her last visit she started taking Livalo samples due to an abnormal lipid profile. Her total cholesterol was 213, triglycerides 122, HDL 55 and LDL 134. Over the intial month on Livalo, she seemed to tolerate it.   Then she reported some right flank pain, especially when lifting weights, which he had started to do at the same time. She stopped her cholesterol medicine and stopped her exercise and her symptoms improved. She then restarted her exercise and had symptoms and realized that it was not likely the cholesterol medicine that was causing her right side pain. Her repeat cholesterol profile was abnormally high on only Zetia monotherapy, as she discontinued Livalo due to cramps. Her LDL particle number was 3189, LDL content was 190, HDL C. was 60 and triglycerides 148.    Breanna Sharp returns today for followup of her lipid profile. Her lipid profile in April was markedly improved with an LDL particle #1594, LDL content 105, HDL 51 and triglycerides 142. She reports some  improvement in her shortness of breath and has managed to work on exercise and has had 12 pounds of weight loss.  I saw Breanna Sharp back in the office today. Overall she is doing well except she still has some shortness of breath despite weight loss, particularly when walking up inclines. We will not been able to really establish the reason for this. Heart rate did go up to a very high level with exercise, 94% predicted on cardiopulmonary exercise testing in 2014. Is not clear whether this is an arrhythmia or just sinus tachycardia. This could explain why she feels worse when walking up hills. I like to see if we can re-create this with exercise treadmill stress testing. If there is a sharp increase in heart rate with exercise, she may benefit from a low-dose beta blocker.  05/11/2016  [name] returns today for follow-up. She is without any significant complaints. Blood pressure was mildly elevated at 133/87 however repeat was 128/78. She is not having any problems on that area and Livalo. She is due for repeat cholesterol test. After an extensive workup we cannot find a cause from a cardiac standpoint of her shortness of breath. She was referred to [facility] and saw Dr. Vaughan Browner who performed palmar function test and feels that she might have some COPD. She's been on Spiriva and reports improvement in her shortness of breath.  06/06/2017  Breanna Sharp was seen today in follow-up. She is overall without complaints. Denies any shortness of breath or chest pain. She reports her COPD is improved significantly. She's managed to lose some weight and exercising regularly. Blood pressure is  reasonable today 134/72.  08/27/2018  Breanna Sharp returns today for follow-up.  She is done extremely well over the past year.  Her shortness of breath has been improved on inhalers.  Her COPD is stable.  Weight is fairly stable.  She exercises regularly walking between 4 and 8 miles a day.  She denies  any chest pain.  She is also changed her eating habits and recently had a repeat lipid profile which is the best that she is ever seen.  Her total cholesterol is 167, triglycerides 107, HDL 57 LDL 89.  08/28/2019  Breanna Sharp seen today for annual follow-up.  Overall she continues to do well.  She is had no worsening COPD.  She does get a little congestion which is seasonal.  She continues to walk 5 to 7 miles almost every day.  She denies any claudication.  She denies any worsening shortness of breath or chest pain.  Her lipids have been well controlled and LDL is a little higher at 103 but stable compared to study 7 months ago.  She was much better controlled about a year ago with LDL at 89.  She is on both Livalo and ezetimibe.  09/13/2020  Breanna Sharp is seen today for routine follow-up.  She reports her breathing is fairly stable with regards to COPD.  She continues to walk and is asymptomatic.  Her cholesterol still remains higher than target.  She is on ezetimibe and Livalo 1 mg.  She had previously been on the 4 mg dose with some side effects.  Her LDL C was 126 as of August 2021.  I would like to see her below 36.  EKG shows normal sinus rhythm.  12/16/2021  Breanna Sharp returns today for follow-up.  She denies any worsening chest pain or shortness of breath.  Unfortunately she cannot tolerate the Livalo when the dose was increased up to 2 mg.  She is been having some achiness in her legs.  Cholesterol remains elevated.  Given her coronary calcification, we will try to target LDL less than 100 or even possibly less than 70.  PMHx:  Past Medical History:  Diagnosis Date   Allergy    Anxiety    Arthritis    Cancer ([place])    Dyslipidemia    statin intolerance   GERD (gastroesophageal reflux disease)    Heart murmur    Hyperlipidemia    Hypertension    Vertigo     Past Surgical History:  Procedure Laterality Date   40 HOUR [place] STUDY N/A 09/29/2019   Procedure: 24 HOUR PH  STUDY;  Surgeon: Mauri Pole, MD;  Location: [facility];  Service: Endoscopy;  Laterality: N/A;   ABDOMINAL HYSTERECTOMY  1972   cervical cancer   BIOPSY  11/24/2019   Procedure: BIOPSY;  Surgeon: Mauri Pole, MD;  Location: [facility];  Service: Endoscopy;;   BRAVO [place] STUDY N/A 11/24/2019   Procedure: BRAVO [place];  Surgeon: Mauri Pole, MD;  [facility];  Service: Endoscopy;  Laterality: N/A;   COLONOSCOPY     ESOPHAGEAL MANOMETRY N/A 09/29/2019   Procedure: ESOPHAGEAL MANOMETRY (EM);  Surgeon: Mauri Pole, MD;  [facility];  Service: Endoscopy;  Laterality: N/A;   ESOPHAGOGASTRODUODENOSCOPY (EGD) WITH PROPOFOL N/A 11/24/2019   Procedure: ESOPHAGOGASTRODUODENOSCOPY (EGD) WITH PROPOFOL;  Surgeon: Mauri Pole, MD;  [facility];  Service: Endoscopy;  Laterality: N/A;   WISDOM TOOTH EXTRACTION     uppers    FAMHx:  Family History  Problem Relation Age of Onset   Hypertension Mother        dx in her 57's   Heart attack Mother 68   Breast cancer Mother        unsure of age   Heart attack Brother 26   Diabetes Brother    Hyperlipidemia Brother    Hypertension Brother    Heart disease Brother    Colon cancer Neg Hx    Esophageal cancer Neg Hx    Rectal cancer Neg Hx    Stomach cancer Neg Hx     SOCHx:   reports that she quit smoking about 19 years ago. Her smoking use included cigarettes. She has a 16.00 pack-year smoking history. She has never used smokeless tobacco. She reports current alcohol use of about 3.0 standard drinks per week. She reports that she does not use drugs.  ALLERGIES:  Allergies  Allergen Reactions   Statins     Muscle cramps    ROS: Pertinent items noted in HPI and remainder of comprehensive ROS otherwise negative.  HOME MEDS: Current Outpatient Medications  Medication Sig Dispense Refill   acetaminophen (TYLENOL) 650 MG CR tablet Take 1,300 mg by mouth every 8 (eight)  hours as needed for pain.     Carboxymethylcellul-Glycerin (LUBRICATING EYE DROPS OP) Place 2 drops into both eyes daily.     esomeprazole (NEXIUM) 40 MG capsule TAKE 1 CAPSULE DAILY BEFORE BREAKFAST 90 capsule 3   ezetimibe (ZETIA) 10 MG tablet Take 1 tablet (10 mg total) by mouth daily. 90 tablet 3   fexofenadine (ALLEGRA) 180 MG tablet Take 180 mg by mouth daily.      FIBER PO Take by mouth.     fluticasone (FLONASE) 50 MCG/ACT nasal spray Place 2 sprays into both nostrils daily. 48 g 6   lisinopril-hydrochlorothiazide (ZESTORETIC) 20-12.5 MG tablet Take 1 tablet by mouth daily. 90 tablet 2   LIVALO 2 MG TABS TAKE 1 TABLET DAILY 90 tablet 3   Multiple Vitamin (MULTIVITAMIN WITH MINERALS) TABS tablet Take 1 tablet by mouth daily.     Netarsudil-Latanoprost (ROCKLATAN) 0.02-0.005 % SOLN Apply to eye.     Tiotropium Bromide Monohydrate (SPIRIVA RESPIMAT) 2.5 MCG/ACT AERS Take 2 puffs by mouth daily. 12 g 6   No current facility-administered medications for this visit.    LABS/IMAGING: No results found for this or any previous visit (from the past 48 hour(s)). No results found.  VITALS: BP (!) 160/82 (BP Location: Left Arm, Patient Position: Sitting, Cuff Size: Normal)    Pulse 79    Ht 5\' 3"  (1.6 m)    Wt 136 lb (61.7 kg)    SpO2 97%    BMI 24.09 kg/m   EXAM: General appearance: alert and no distress Neck: no carotid bruit and no JVD Lungs: clear to auscultation bilaterally Heart: regular rate and rhythm, S1, S2 normal, no murmur, click, rub or gallop Abdomen: soft, non-tender; bowel sounds normal; no masses,  no organomegaly Extremities: extremities normal, atraumatic, no cyanosis or edema Pulses: 2+ and symmetric Skin: Skin color, texture, turgor normal. No rashes or lesions Neurologic: GroPsych: Pleasanty normal Psych: Pleasant  EKG: Normal sinus rhythm at 79-personally reviewed  ASSESSMENT: Dyslipidemia, goal LDL less than 70 Coronary artery calcification/ASCVD 3.    Hypertension 4.   COPD - [place] 5.   Statin intolerance-myalgias  PLAN: 1.   Mrs. Espinoza unfortunately has had statin intolerance causing myalgias.  She remains on ezetimibe but well above a  target LDL less than 70 given coronary artery calcification and ASCVD.  Based on this she will need additional therapies.  We discussed PCSK9 inhibitors however she was leery about giving herself injections.  I thought she might be a candidate for Leqvio which would be simpler and less frequent dosing and provide enough lipid-lowering probably to make her target less than 70.  We will go ahead and pursue this option.  Plan follow-up with me in 6 months with repeat lipids.   Pixie Casino, MD, [facility], [place] Director of the Advanced Lipid Disorders &  Cardiovascular Risk Reduction Clinic Diplomate of the American Board of Clinical Lipidology Attending Cardiologist  Direct Dial: [phone]   Fax: [phone]  Website:  [email or web address] 12/16/2021, 11:33 AM

## 2021-12-27 NOTE — Telephone Encounter (Signed)
Benefits investigation enrollment completed on 12/20/21  Benefits investigation report notes the following:  Type of insurance: Medicare A&B, Tricare  OOP Max: N/A / Met: N/A  Co-insurance: [id number]  PA required: N  PA phone number: N/A  Secondary insurance is Tricare for life which covers Part B coinsurance and deductible ($226)

## 2022-01-03 DIAGNOSIS — H401131 Primary open-angle glaucoma, bilateral, mild stage: Secondary | ICD-10-CM | POA: Diagnosis not present

## 2022-01-03 DIAGNOSIS — H2513 Age-related nuclear cataract, bilateral: Secondary | ICD-10-CM | POA: Diagnosis not present

## 2022-01-03 DIAGNOSIS — H25013 Cortical age-related cataract, bilateral: Secondary | ICD-10-CM | POA: Diagnosis not present

## 2022-01-03 DIAGNOSIS — H18413 Arcus senilis, bilateral: Secondary | ICD-10-CM | POA: Diagnosis not present

## 2022-01-05 NOTE — Telephone Encounter (Signed)
Called patient in response to MyChart message  She would like to wait until after her 2/21 visit with PCP to see how her lab work is before deciding on Leqvio   She is concerned about SE - explained minimally reported SE   She will follow up end of this month.

## 2022-01-16 ENCOUNTER — Other Ambulatory Visit: Payer: Self-pay

## 2022-01-17 ENCOUNTER — Ambulatory Visit (INDEPENDENT_AMBULATORY_CARE_PROVIDER_SITE_OTHER): Payer: Medicare Other | Admitting: Family Medicine

## 2022-01-17 ENCOUNTER — Encounter: Payer: Self-pay | Admitting: Family Medicine

## 2022-01-17 VITALS — BP 146/74 | HR 91 | Temp 97.3°F | Ht 63.0 in | Wt 132.6 lb

## 2022-01-17 DIAGNOSIS — E559 Vitamin D deficiency, unspecified: Secondary | ICD-10-CM

## 2022-01-17 DIAGNOSIS — I10 Essential (primary) hypertension: Secondary | ICD-10-CM | POA: Diagnosis not present

## 2022-01-17 DIAGNOSIS — M8580 Other specified disorders of bone density and structure, unspecified site: Secondary | ICD-10-CM | POA: Diagnosis not present

## 2022-01-17 DIAGNOSIS — M810 Age-related osteoporosis without current pathological fracture: Secondary | ICD-10-CM

## 2022-01-17 DIAGNOSIS — E785 Hyperlipidemia, unspecified: Secondary | ICD-10-CM

## 2022-01-17 LAB — CBC
HCT: 39.2 % (ref 36.0–46.0)
Hemoglobin: 12.8 g/dL (ref 12.0–15.0)
MCHC: 32.7 g/dL (ref 30.0–36.0)
MCV: 86.8 fl (ref 78.0–100.0)
Platelets: 230 10*3/uL (ref 150.0–400.0)
RBC: 4.51 Mil/uL (ref 3.87–5.11)
RDW: 13.1 % (ref 11.5–15.5)
WBC: 4 10*3/uL (ref 4.0–10.5)

## 2022-01-17 LAB — COMPREHENSIVE METABOLIC PANEL
ALT: 22 U/L (ref 0–35)
AST: 25 U/L (ref 0–37)
Albumin: 4.3 g/dL (ref 3.5–5.2)
Alkaline Phosphatase: 63 U/L (ref 39–117)
BUN: 12 mg/dL (ref 6–23)
CO2: 30 mEq/L (ref 19–32)
Calcium: 10.3 mg/dL (ref 8.4–10.5)
Chloride: 101 mEq/L (ref 96–112)
Creatinine, Ser: 0.78 mg/dL (ref 0.40–1.20)
GFR: 76.2 mL/min (ref >60.00)
Glucose, Bld: 108 mg/dL — ABNORMAL HIGH (ref 70–99)
Potassium: 3.8 mEq/L (ref 3.5–5.1)
Sodium: 137 mEq/L (ref 135–145)
Total Bilirubin: 0.6 mg/dL (ref 0.2–1.2)
Total Protein: 7 g/dL (ref 6.0–8.3)

## 2022-01-17 LAB — LIPID PANEL
Cholesterol: 270 mg/dL — ABNORMAL HIGH (ref 0–200)
HDL: 58.4 mg/dL (ref >39.00)
LDL Cholesterol: 172 mg/dL — ABNORMAL HIGH (ref 0–99)
NonHDL: 211.66
Total CHOL/HDL Ratio: 5
Triglycerides: 198 mg/dL — ABNORMAL HIGH (ref 0.0–149.0)
VLDL: 39.6 mg/dL (ref 0.0–40.0)

## 2022-01-17 LAB — VITAMIN D 25 HYDROXY (VIT D DEFICIENCY, FRACTURES): VITD: 38.2 ng/mL (ref 30.00–100.00)

## 2022-01-17 NOTE — Progress Notes (Addendum)
Established Patient Office Visit  Subjective:  Patient ID: Breanna Sharp, female    DOB: [birth year]  Age: 71 y.o. MRN: [id number]  CC:  Chief Complaint  Patient presents with   Follow-up    6 month follow up, no concerns. Patient fasting.     HPI Breanna Sharp presents for follow-up of hypertension, vitamin D deficiency, osteopenia and mixed hyperlipidemia.  Cardiology is also following.  Livalo was discontinued secondary to myalgias.  She continues with Zetia.  Possibility of starting a PCK S9 inhibitor pends.  Brings in a list of blood pressures running in the 110s to 115/70 range.  She has a documented home that.  She discontinued vitamin D for now.  Past Medical History:  Diagnosis Date   Allergy    Anxiety    Arthritis    Cancer ([place])    Dyslipidemia    statin intolerance   GERD (gastroesophageal reflux disease)    Heart murmur    Hyperlipidemia    Hypertension    Vertigo     Past Surgical History:  Procedure Laterality Date   73 HOUR [place] STUDY N/A 09/29/2019   Procedure: 24 HOUR PH STUDY;  Surgeon: Breanna Pole, MD;  [facility];  Service: Endoscopy;  Laterality: N/A;   ABDOMINAL HYSTERECTOMY  1972   cervical cancer   BIOPSY  11/24/2019   Procedure: BIOPSY;  Surgeon: Breanna Pole, MD;  [facility];  Service: Endoscopy;;   BRAVO [place] STUDY N/A 11/24/2019   Procedure: BRAVO [place];  Surgeon: Breanna Pole, MD;  [facility];  Service: Endoscopy;  Laterality: N/A;   COLONOSCOPY     ESOPHAGEAL MANOMETRY N/A 09/29/2019   Procedure: ESOPHAGEAL MANOMETRY (EM);  Surgeon: Breanna Pole, MD;  [facility];  Service: Endoscopy;  Laterality: N/A;   ESOPHAGOGASTRODUODENOSCOPY (EGD) WITH PROPOFOL N/A 11/24/2019   Procedure: ESOPHAGOGASTRODUODENOSCOPY (EGD) WITH PROPOFOL;  Surgeon: Breanna Pole, MD;  [facility];  Service: Endoscopy;  Laterality: N/A;   WISDOM TOOTH EXTRACTION     uppers     Family History  Problem Relation Age of Onset   Hypertension Mother        dx in her 46's   Heart attack Mother 30   Breast cancer Mother        unsure of age   Heart attack Brother 52   Diabetes Brother    Hyperlipidemia Brother    Hypertension Brother    Heart disease Brother    Colon cancer Neg Hx    Esophageal cancer Neg Hx    Rectal cancer Neg Hx    Stomach cancer Neg Hx     Social History   Socioeconomic History   Marital status: Married    Spouse name: [name]   Number of children: 0   Years of education: 12   Highest education level: Not on file  Occupational History    Employer: RETIRED    Comment: Retired  Tobacco Use   Smoking status: Former    Packs/day: 0.50    Years: 32.00    Pack years: 16.00    Types: Cigarettes    Quit date: 11/27/2002    Years since quitting: 19.1   Smokeless tobacco: Never  Vaping Use   Vaping Use: Never used  Substance and Sexual Activity   Alcohol use: Yes    Alcohol/week: 3.0 standard drinks    Types: 3 Standard drinks or equivalent per week    Comment: social -  2 or 3 times a month 1-2 glasses of wine   Drug use: No   Sexual activity: Yes    Partners: Male  Other Topics Concern   Not on file  Social History Narrative   Exercise 3 to 4 times/week walking for 45 min -1 hour   Patient lives at home with her husband Breanna Sharp).   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   Right handed.   decaffeine Green Tea.   Social Determinants of Health   Financial Resource Strain: Not on file  Food Insecurity: Not on file  Transportation Needs: Not on file  Physical Activity: Not on file  Stress: Not on file  Social Connections: Not on file  Intimate Partner Violence: Not on file    Outpatient Medications Prior to Visit  Medication Sig Dispense Refill   acetaminophen (TYLENOL) 650 MG CR tablet Take 1,300 mg by mouth every 8 (eight)  hours as needed for pain.     Carboxymethylcellul-Glycerin (LUBRICATING EYE DROPS OP) Place 2 drops into both eyes daily.     esomeprazole (NEXIUM) 40 MG capsule TAKE 1 CAPSULE DAILY BEFORE BREAKFAST 90 capsule 3   ezetimibe (ZETIA) 10 MG tablet Take 1 tablet (10 mg total) by mouth daily. 90 tablet 3   fexofenadine (ALLEGRA) 180 MG tablet Take 180 mg by mouth daily.      FIBER PO Take by mouth.     fluticasone (FLONASE) 50 MCG/ACT nasal spray Place 2 sprays into both nostrils daily. 48 g 6   lisinopril-hydrochlorothiazide (ZESTORETIC) 20-12.5 MG tablet Take 1 tablet by mouth daily. 90 tablet 2   Multiple Vitamin (MULTIVITAMIN WITH MINERALS) TABS tablet Take 1 tablet by mouth daily.     Netarsudil-Latanoprost (ROCKLATAN) 0.02-0.005 % SOLN Apply to eye.     Tiotropium Bromide Monohydrate (SPIRIVA RESPIMAT) 2.5 MCG/ACT AERS Take 2 puffs by mouth daily. 12 g 6   No facility-administered medications prior to visit.    Allergies  Allergen Reactions   Statins     Muscle cramps    ROS Review of Systems  Constitutional:  Negative for diaphoresis, fatigue, fever and unexpected weight change.  HENT: Negative.    Eyes:  Negative for photophobia and visual disturbance.  Respiratory: Negative.    Cardiovascular: Negative.   Gastrointestinal: Negative.   Endocrine: Negative for polyphagia and polyuria.  Genitourinary: Negative.   Musculoskeletal:  Negative for gait problem and myalgias.  Neurological:  Negative for speech difficulty, weakness and light-headedness.  Psychiatric/Behavioral: Negative.       Objective:    Physical Exam Vitals and nursing note reviewed.  Constitutional:      General: She is not in acute distress.    Appearance: Normal appearance. She is normal weight. She is not ill-appearing, toxic-appearing or diaphoretic.  HENT:     Head: Normocephalic and atraumatic.     Right Ear: Tympanic membrane, ear canal and external ear normal.     Left Ear: Tympanic membrane,  ear canal and external ear normal.     Mouth/Throat:     Mouth: Mucous membranes are moist.     Pharynx: Oropharynx is clear. No oropharyngeal exudate or posterior oropharyngeal erythema.  Eyes:     General: No scleral icterus.       Right eye: No discharge.        Left eye: No discharge.     Extraocular Movements: Extraocular movements intact.  Conjunctiva/sclera: Conjunctivae normal.     Pupils: Pupils are equal, round, and reactive to light.  Cardiovascular:     Rate and Rhythm: Normal rate and regular rhythm.  Pulmonary:     Effort: Pulmonary effort is normal.     Breath sounds: Normal breath sounds.  Abdominal:     General: Bowel sounds are normal.  Musculoskeletal:     Cervical back: No rigidity or tenderness.  Lymphadenopathy:     Cervical: No cervical adenopathy.  Skin:    General: Skin is warm and dry.  Neurological:     Mental Status: She is alert and oriented to person, place, and time.  Psychiatric:        Mood and Affect: Mood normal.        Behavior: Behavior normal.    BP (!) 146/74 (BP Location: Left Arm, Patient Position: Sitting, Cuff Size: Normal)    Pulse 91    Temp (!) 97.3 F (36.3 C) (Temporal)    Ht 5\' 3"  (1.6 m)    Wt 132 lb 9.6 oz (60.1 kg)    SpO2 100%    BMI 23.49 kg/m  Wt Readings from Last 3 Encounters:  01/17/22 132 lb 9.6 oz (60.1 kg)  12/16/21 136 lb (61.7 kg)  07/14/21 135 lb 9.6 oz (61.5 kg)     There are no preventive care reminders to display for this patient.  There are no preventive care reminders to display for this patient.  Lab Results  Component Value Date   TSH 2.390 12/03/2017   Lab Results  Component Value Date   WBC 4.7 07/14/2021   HGB 12.6 07/14/2021   HCT 39.0 07/14/2021   MCV 86.6 07/14/2021   PLT 242.0 07/14/2021   Lab Results  Component Value Date   NA 139 07/14/2021   K 4.0 07/14/2021   CO2 28 07/14/2021   GLUCOSE 108 (H) 07/14/2021   BUN 15 07/14/2021   CREATININE 0.84 07/14/2021   BILITOT 0.6  07/14/2021   ALKPHOS 78 07/14/2021   AST 23 07/14/2021   ALT 25 07/14/2021   PROT 6.8 07/14/2021   ALBUMIN 4.3 07/14/2021   CALCIUM 10.4 07/14/2021   GFR 69.97 07/14/2021   Lab Results  Component Value Date   CHOL 199 07/14/2021   Lab Results  Component Value Date   HDL 57.50 07/14/2021   Lab Results  Component Value Date   LDLCALC 117 (H) 07/14/2021   Lab Results  Component Value Date   TRIG 122.0 07/14/2021   Lab Results  Component Value Date   CHOLHDL 3 07/14/2021   Lab Results  Component Value Date   HGBA1C 5.6 07/14/2021      Assessment & Plan:   Problem List Items Addressed This Visit       Cardiovascular and Mediastinum   Essential hypertension   Relevant Orders   Comprehensive metabolic panel   CBC     Musculoskeletal and Integument   Osteopenia   Relevant Orders   DG Bone Density     Other   Hyperlipidemia - Primary   Relevant Orders   Comprehensive metabolic panel   Lipid panel   Vitamin D deficiency   Relevant Orders   VITAMIN D 25 Hydroxy (Vit-D Deficiency, Fractures)   Other Visit Diagnoses     Age-related osteoporosis without current pathological fracture       Relevant Orders   DG Bone Density       No orders of the defined types were placed in  this encounter.   Follow-up: Return in about 6 months (around 07/17/2022).  Continue all medicines as above.  Information was given on preventing osteoporosis and high cholesterol.  Information was given about the Shingrix vaccine and encouraged her to have it through her pharmacy.  Libby Maw, MD

## 2022-01-19 ENCOUNTER — Telehealth: Payer: Self-pay | Admitting: Internal Medicine

## 2022-01-19 ENCOUNTER — Telehealth: Payer: Self-pay | Admitting: Family Medicine

## 2022-01-19 NOTE — Telephone Encounter (Signed)
Pt stated she has some questions needs clarification on her AVS. Something Dr Ethelene Hal wrote needs clarification. Please call

## 2022-01-19 NOTE — Telephone Encounter (Signed)
Patient called in to say that she want to take the shot for the cholesterol. She ask the nurse to give her call. Please advise

## 2022-01-19 NOTE — Telephone Encounter (Signed)
Spoke with pt regarding Leqvio, pt states she is ready to move forward with this. Advised pt that I would send this message to Dr. Debara Pickett and his nurse. Pt verbalizes understanding.

## 2022-01-19 NOTE — Telephone Encounter (Signed)
Spoke with patient who verbally understood labs per patient she's currently taking multivitamins with calcium would like a prescription for 1200 mg of calcium sent to pharmacy if she needs to take this amount. Questioning if she should take 1200 due to levels close to elevated levels? Patient also states that she have stopped taking Vitamin D understood results but not sure if she should take a lower dose of Vitamin D? Please advise.

## 2022-01-20 ENCOUNTER — Ambulatory Visit: Payer: TRICARE For Life (TFL) | Admitting: Internal Medicine

## 2022-01-20 NOTE — Telephone Encounter (Signed)
Called patient to discuss Breanna Sharp - she would like to proceed with injections She is available week of 3/6 - 3/10  Call placed to [facility] infusion clinic x2 - left message Patient aware waiting on call back

## 2022-01-20 NOTE — Telephone Encounter (Signed)
Called patient - advised have called [facility] Sharp infusion clinic 4x with no response. Will call when back in office next week to get her scheduled

## 2022-01-20 NOTE — Telephone Encounter (Signed)
Spoke with patient about leqvio. She would like to proceed with this lipid treatment. Attempted to reach [facility] infusion clinic to schedule - no answer x2 -- left message.

## 2022-01-20 NOTE — Telephone Encounter (Signed)
Patient aware that bones are thinning and requested supplements are OTC patient will pick up and start.

## 2022-01-23 ENCOUNTER — Other Ambulatory Visit: Payer: Self-pay | Admitting: Family Medicine

## 2022-01-23 DIAGNOSIS — J449 Chronic obstructive pulmonary disease, unspecified: Secondary | ICD-10-CM

## 2022-01-24 NOTE — Telephone Encounter (Signed)
Spoke with [facility]  Leqvio injection scheduled for 02/01/22 @ 1:30pm

## 2022-01-25 ENCOUNTER — Other Ambulatory Visit: Payer: Self-pay | Admitting: Internal Medicine

## 2022-01-25 DIAGNOSIS — E785 Hyperlipidemia, unspecified: Secondary | ICD-10-CM

## 2022-01-25 DIAGNOSIS — I2510 Atherosclerotic heart disease of native coronary artery without angina pectoris: Secondary | ICD-10-CM

## 2022-01-25 DIAGNOSIS — T466X5A Adverse effect of antihyperlipidemic and antiarteriosclerotic drugs, initial encounter: Secondary | ICD-10-CM

## 2022-01-25 DIAGNOSIS — M791 Myalgia, unspecified site: Secondary | ICD-10-CM

## 2022-01-26 ENCOUNTER — Telehealth: Payer: Self-pay | Admitting: Family Medicine

## 2022-01-26 DIAGNOSIS — M8580 Other specified disorders of bone density and structure, unspecified site: Secondary | ICD-10-CM

## 2022-01-26 NOTE — Telephone Encounter (Signed)
Pt called about her bone density scan, she would like to cancel appt at [facility] because they couldn't schedule her until august and she would like to go to [facility] instead since they had more availability  ?

## 2022-01-27 DIAGNOSIS — H25013 Cortical age-related cataract, bilateral: Secondary | ICD-10-CM | POA: Diagnosis not present

## 2022-01-27 DIAGNOSIS — H2513 Age-related nuclear cataract, bilateral: Secondary | ICD-10-CM | POA: Diagnosis not present

## 2022-01-27 DIAGNOSIS — H2512 Age-related nuclear cataract, left eye: Secondary | ICD-10-CM | POA: Diagnosis not present

## 2022-01-27 DIAGNOSIS — H25043 Posterior subcapsular polar age-related cataract, bilateral: Secondary | ICD-10-CM | POA: Diagnosis not present

## 2022-01-27 DIAGNOSIS — H401131 Primary open-angle glaucoma, bilateral, mild stage: Secondary | ICD-10-CM | POA: Diagnosis not present

## 2022-01-30 ENCOUNTER — Other Ambulatory Visit: Payer: Self-pay | Admitting: Family Medicine

## 2022-01-30 DIAGNOSIS — J301 Allergic rhinitis due to pollen: Secondary | ICD-10-CM

## 2022-01-31 ENCOUNTER — Other Ambulatory Visit (HOSPITAL_COMMUNITY): Payer: Self-pay | Admitting: *Deleted

## 2022-01-31 DIAGNOSIS — Z20822 Contact with and (suspected) exposure to covid-19: Secondary | ICD-10-CM | POA: Diagnosis not present

## 2022-02-01 ENCOUNTER — Other Ambulatory Visit: Payer: Self-pay

## 2022-02-01 ENCOUNTER — Ambulatory Visit (HOSPITAL_COMMUNITY)
Admission: RE | Admit: 2022-02-01 | Discharge: 2022-02-01 | Disposition: A | Discharge status: Home / Self Care | Payer: Medicare Other | Source: Ambulatory Visit | Attending: Internal Medicine | Admitting: Internal Medicine

## 2022-02-01 DIAGNOSIS — T466X5A Adverse effect of antihyperlipidemic and antiarteriosclerotic drugs, initial encounter: Secondary | ICD-10-CM | POA: Diagnosis not present

## 2022-02-01 DIAGNOSIS — I2510 Atherosclerotic heart disease of native coronary artery without angina pectoris: Secondary | ICD-10-CM | POA: Insufficient documentation

## 2022-02-01 DIAGNOSIS — E785 Hyperlipidemia, unspecified: Secondary | ICD-10-CM | POA: Diagnosis not present

## 2022-02-01 DIAGNOSIS — M791 Myalgia, unspecified site: Secondary | ICD-10-CM | POA: Diagnosis not present

## 2022-02-01 MED ORDER — INCLISIRAN SODIUM 284 MG/1.5ML ~~LOC~~ SOSY
284.0000 mg | PREFILLED_SYRINGE | Freq: Once | SUBCUTANEOUS | Status: AC
  Administered 2022-02-01: 284 mg via SUBCUTANEOUS

## 2022-02-01 MED ORDER — INCLISIRAN SODIUM 284 MG/1.5ML ~~LOC~~ SOSY
PREFILLED_SYRINGE | SUBCUTANEOUS | Status: AC
  Filled 2022-02-01: qty 1.5

## 2022-02-02 ENCOUNTER — Other Ambulatory Visit (HOSPITAL_BASED_OUTPATIENT_CLINIC_OR_DEPARTMENT_OTHER): Payer: Medicare Other

## 2022-02-03 NOTE — Telephone Encounter (Signed)
Called patient. She had Leqvio injection on 02/01/22 - well tolerated, no issues.  ? ?She asked if she should continue zetia - advised yes, until next set of labs ? ?She asked if OK to take calcium, vit D Rx'ed by PCP - advised yes ? ?Scheduled for visit July 24 with Hilty MD. She is aware to have fasting labs prior  ?

## 2022-02-14 DIAGNOSIS — H61893 Other specified disorders of external ear, bilateral: Secondary | ICD-10-CM | POA: Diagnosis not present

## 2022-02-14 DIAGNOSIS — H938X3 Other specified disorders of ear, bilateral: Secondary | ICD-10-CM | POA: Diagnosis not present

## 2022-02-14 DIAGNOSIS — Z87891 Personal history of nicotine dependence: Secondary | ICD-10-CM | POA: Diagnosis not present

## 2022-02-15 ENCOUNTER — Ambulatory Visit (INDEPENDENT_AMBULATORY_CARE_PROVIDER_SITE_OTHER): Payer: Medicare Other

## 2022-02-15 DIAGNOSIS — Z0000 Encounter for general adult medical examination without abnormal findings: Secondary | ICD-10-CM

## 2022-02-15 NOTE — Patient Instructions (Signed)
Breanna Sharp , ?Thank you for taking time to come for your Medicare Wellness Visit. I appreciate your ongoing commitment to your health goals. Please review the following plan we discussed and let me know if I can assist you in the future.  ? ?Screening recommendations/referrals: ?Colonoscopy: 09/12/2017  5 yrs due 2023 ?Mammogram: 09/12/2021 ?Bone Density: 11/22/2018 ?Recommended yearly ophthalmology/optometry visit for glaucoma screening and checkup ?Recommended yearly dental visit for hygiene and checkup ? ?Vaccinations: ?Influenza vaccine: completed  ?Pneumococcal vaccine:  ?Tdap vaccine: 08/20/2013 ?Shingles vaccine: will consider    ? ?Advanced directives: none  ? ?Conditions/risks identified: none  ? ?Next appointment: none  ? ? ?Preventive Care 72 Years and Older, Female ?Preventive care refers to lifestyle choices and visits with your health care provider that can promote health and wellness. ?What does preventive care include? ?A yearly physical exam. This is also called an annual well check. ?Dental exams once or twice a year. ?Routine eye exams. Ask your health care provider how often you should have your eyes checked. ?Personal lifestyle choices, including: ?Daily care of your teeth and gums. ?Regular physical activity. ?Eating a healthy diet. ?Avoiding tobacco and drug use. ?Limiting alcohol use. ?Practicing safe sex. ?Taking low-dose aspirin every day. ?Taking vitamin and mineral supplements as recommended by your health care provider. ?What happens during an annual well check? ?The services and screenings done by your health care provider during your annual well check will depend on your age, overall health, lifestyle risk factors, and family history of disease. ?Counseling  ?Your health care provider may ask you questions about your: ?Alcohol use. ?Tobacco use. ?Drug use. ?Emotional well-being. ?Home and relationship well-being. ?Sexual activity. ?Eating habits. ?History of falls. ?Memory and  ability to understand (cognition). ?Work and work Statistician. ?Reproductive health. ?Screening  ?You may have the following tests or measurements: ?Height, weight, and BMI. ?Blood pressure. ?Lipid and cholesterol levels. These may be checked every 5 years, or more frequently if you are over 72 years old. ?Skin check. ?Lung cancer screening. You may have this screening every year starting at age 72 if you have a 30-pack-year history of smoking and currently smoke or have quit within the past 15 years. ?Fecal occult blood test (FOBT) of the stool. You may have this test every year starting at age 72. ?Flexible sigmoidoscopy or colonoscopy. You may have a sigmoidoscopy every 5 years or a colonoscopy every 10 years starting at age 72. ?Hepatitis C blood test. ?Hepatitis B blood test. ?Sexually transmitted disease (STD) testing. ?Diabetes screening. This is done by checking your blood sugar (glucose) after you have not eaten for a while (fasting). You may have this done every 1-3 years. ?Bone density scan. This is done to screen for osteoporosis. You may have this done starting at age 72. ?Mammogram. This may be done every 1-2 years. Talk to your health care provider about how often you should have regular mammograms. ?Talk with your health care provider about your test results, treatment options, and if necessary, the need for more tests. ?Vaccines  ?Your health care provider may recommend certain vaccines, such as: ?Influenza vaccine. This is recommended every year. ?Tetanus, diphtheria, and acellular pertussis (Tdap, Td) vaccine. You may need a Td booster every 10 years. ?Zoster vaccine. You may need this after age 72. ?Pneumococcal 13-valent conjugate (PCV13) vaccine. One dose is recommended after age 2. ?Pneumococcal polysaccharide (PPSV23) vaccine. One dose is recommended after age 72. ?Talk to your health care provider about which screenings and vaccines you  need and how often you need them. ?This information is  not intended to replace advice given to you by your health care provider. Make sure you discuss any questions you have with your health care provider. ?Document Released: 12/10/2015 Document Revised: 08/02/2016 Document Reviewed: 09/14/2015 ?Elsevier Interactive Patient Education ? 2017 [place]. ? ?Fall Prevention in the Home ?Falls can cause injuries. They can happen to people of all ages. There are many things you can do to make your home safe and to help prevent falls. ?What can I do on the outside of my home? ?Regularly fix the edges of walkways and driveways and fix any cracks. ?Remove anything that might make you trip as you walk through a door, such as a raised step or threshold. ?Trim any bushes or trees on the path to your home. ?Use bright outdoor lighting. ?Clear any walking paths of anything that might make someone trip, such as rocks or tools. ?Regularly check to see if handrails are loose or broken. Make sure that both sides of any steps have handrails. ?Any raised decks and porches should have guardrails on the edges. ?Have any leaves, snow, or ice cleared regularly. ?Use sand or salt on walking paths during winter. ?Clean up any spills in your garage right away. This includes oil or grease spills. ?What can I do in the bathroom? ?Use night lights. ?Install grab bars by the toilet and in the tub and shower. Do not use towel bars as grab bars. ?Use non-skid mats or decals in the tub or shower. ?If you need to sit down in the shower, use a plastic, non-slip stool. ?Keep the floor dry. Clean up any water that spills on the floor as soon as it happens. ?Remove soap buildup in the tub or shower regularly. ?Attach bath mats securely with double-sided non-slip rug tape. ?Do not have throw rugs and other things on the floor that can make you trip. ?What can I do in the bedroom? ?Use night lights. ?Make sure that you have a light by your bed that is easy to reach. ?Do not use any sheets or blankets that  are too big for your bed. They should not hang down onto the floor. ?Have a firm chair that has side arms. You can use this for support while you get dressed. ?Do not have throw rugs and other things on the floor that can make you trip. ?What can I do in the kitchen? ?Clean up any spills right away. ?Avoid walking on wet floors. ?Keep items that you use a lot in easy-to-reach places. ?If you need to reach something above you, use a strong step stool that has a grab bar. ?Keep electrical cords out of the way. ?Do not use floor polish or wax that makes floors slippery. If you must use wax, use non-skid floor wax. ?Do not have throw rugs and other things on the floor that can make you trip. ?What can I do with my stairs? ?Do not leave any items on the stairs. ?Make sure that there are handrails on both sides of the stairs and use them. Fix handrails that are broken or loose. Make sure that handrails are as long as the stairways. ?Check any carpeting to make sure that it is firmly attached to the stairs. Fix any carpet that is loose or worn. ?Avoid having throw rugs at the top or bottom of the stairs. If you do have throw rugs, attach them to the floor with carpet tape. ?Make sure that  you have a light switch at the top of the stairs and the bottom of the stairs. If you do not have them, ask someone to add them for you. ?What else can I do to help prevent falls? ?Wear shoes that: ?Do not have high heels. ?Have rubber bottoms. ?Are comfortable and fit you well. ?Are closed at the toe. Do not wear sandals. ?If you use a stepladder: ?Make sure that it is fully opened. Do not climb a closed stepladder. ?Make sure that both sides of the stepladder are locked into place. ?Ask someone to hold it for you, if possible. ?Clearly mark and make sure that you can see: ?Any grab bars or handrails. ?First and last steps. ?Where the edge of each step is. ?Use tools that help you move around (mobility aids) if they are needed. These  include: ?Canes. ?Walkers. ?Scooters. ?Crutches. ?Turn on the lights when you go into a dark area. Replace any light bulbs as soon as they burn out. ?Set up your furniture so you have a clear path. Avoid movi

## 2022-02-15 NOTE — Progress Notes (Signed)
? ?Subjective:  ? Breanna Sharp is a 71 y.o. female who presents for Medicare Annual (Subsequent) preventive examination. ? ?I connected with Breanna Sharp today by telephone and verified that I am speaking with the correct person using two identifiers. ?Location patient: home ?Location provider: work ?Persons participating in the virtual visit: patient, provider. ?  ?I discussed the limitations, risks, security and privacy concerns of performing an evaluation and management service by telephone and the availability of in person appointments. I also discussed with the patient that there may be a patient responsible charge related to this service. The patient expressed understanding and verbally consented to this telephonic visit.  ?  ?Interactive audio and video telecommunications were attempted between this provider and patient, however failed, due to patient having technical difficulties OR patient did not have access to video capability.  We continued and completed visit with audio only. ? ?  ?Review of Systems    ? ?Cardiac Risk Factors include: advanced age (>70mn, >>65women);dyslipidemia ? ?   ?Objective:  ?  ?Today's Vitals  ? ?There is no height or weight on file to calculate BMI. ? ? ?  02/15/2022  ? 10:51 AM 11/16/2020  ?  9:09 AM 11/24/2019  ?  7:29 AM 11/13/2019  ? 11:12 AM 03/11/2019  ? 10:24 AM 09/10/2018  ?  8:58 AM 09/12/2017  ?  8:49 AM  ?Advanced Directives  ?Does Patient Have a Medical Advance Directive? No No No Yes No No Yes  ?Type of Advance Directive       Healthcare Power of Attorney  ?Would patient like information on creating a medical advance directive? No - Patient declined No - Patient declined No - Patient declined  No - Patient declined Yes (MAU/Ambulatory/Procedural Areas - Information given)   ? ? ?Current Medications (verified) ?Outpatient Encounter Medications as of 02/15/2022  ?Medication Sig  ? acetaminophen (TYLENOL) 650 MG CR tablet Take 1,300 mg by mouth every 8 (eight)  hours as needed for pain.  ? Carboxymethylcellul-Glycerin (LUBRICATING EYE DROPS OP) Place 2 drops into both eyes daily.  ? esomeprazole (NEXIUM) 40 MG capsule TAKE 1 CAPSULE DAILY BEFORE BREAKFAST  ? ezetimibe (ZETIA) 10 MG tablet Take 1 tablet (10 mg total) by mouth daily.  ? fexofenadine (ALLEGRA) 180 MG tablet Take 180 mg by mouth daily.   ? FIBER PO Take by mouth.  ? fluticasone (FLONASE) 50 MCG/ACT nasal spray Place 2 sprays into both nostrils daily.  ? inclisiran (LEQVIO) 284 MG/1.5ML SOSY injection Inject 284 mg into the skin as directed.  ? lisinopril-hydrochlorothiazide (ZESTORETIC) 20-12.5 MG tablet Take 1 tablet by mouth daily.  ? Multiple Vitamin (MULTIVITAMIN WITH MINERALS) TABS tablet Take 1 tablet by mouth daily.  ? Netarsudil-Latanoprost (ROCKLATAN) 0.02-0.005 % SOLN Apply to eye.  ? Tiotropium Bromide Monohydrate (SPIRIVA RESPIMAT) 2.5 MCG/ACT AERS Take 2 puffs by mouth daily.  ? ?No facility-administered encounter medications on file as of 02/15/2022.  ? ? ?Allergies (verified) ?Statins  ? ?History: ?Past Medical History:  ?Diagnosis Date  ? Allergy   ? Anxiety   ? Arthritis   ? Cancer ([facility]   ? Dyslipidemia   ? statin intolerance  ? GERD (gastroesophageal reflux disease)   ? Heart murmur   ? Hyperlipidemia   ? Hypertension   ? Vertigo   ? ?Past Surgical History:  ?Procedure Laterality Date  ? 256HOUR PD[place]STUDY N/A 09/29/2019  ? Procedure: 2[place]STUDY;  Surgeon: NMauri Pole MD;  [facility];  Service: Endoscopy;  Laterality: N/A;  ? ABDOMINAL HYSTERECTOMY  1972  ? cervical cancer  ? BIOPSY  11/24/2019  ? Procedure: BIOPSY;  Surgeon: Mauri Pole, MD;  Location[facility];  Service: Endoscopy;;  ? BRAVO [place] STUDY N/A 11/24/2019  ? Procedure: BRAVO [place];  Surgeon: Mauri Pole, MD;  Location: [facility] END[facility];  Service: Endoscopy;  Laterality: N/A;  ? COLONOSCOPY    ? ESOPHAGEAL MANOMETRY N/A 09/29/2019  ? Procedure: ESOPHAGEAL MANOMETRY (EM);  Surgeon: Mauri Pole, MD;  Location: [facility] ENDOSCOPY;  Service: Endoscopy;  Laterality: N/A;  ? ESOPHAGOGASTRODUODENOSCOPY (EGD) WITH PROPOFOL N/A 11/24/2019  ? Procedure: ESOPHAGOGASTRODUODENOSCOPY (EGD) WITH PROPOFOL;  Surgeon: Mauri Pole, MD;  Location: [facility] ENDOS[facility];  Service: Endoscopy;  Laterality: N/A;  ? WISDOM TOOTH EXTRACTION    ? uppers  ? ?Family History  ?Problem Relation Age of Onset  ? Hypertension Mother   ?     dx in her 44's  ? Heart attack Mother 35  ? Breast cancer Mother   ?     unsure of age  ? Heart attack Brother 36  ? Diabetes Brother   ? Hyperlipidemia Brother   ? Hypertension Brother   ? Heart disease Brother   ? Colon cancer Neg Hx   ? Esophageal cancer Neg Hx   ? Rectal cancer Neg Hx   ? Stomach cancer Neg Hx   ? ?Social History  ? ?Socioeconomic History  ? Marital status: Married  ?  Spouse name: [name]  ? Number of children: 0  ? Years of education: 58  ? Highest education level: Not on file  ?Occupational History  ?  Employer: RETIRED  ?  Comment: Retired  ?Tobacco Use  ? Smoking status: Former  ?  Packs/day: 0.50  ?  Years: 32.00  ?  Pack years: 16.00  ?  Types: Cigarettes  ?  Quit date: 11/27/2002  ?  Years since quitting: 19.2  ? Smokeless tobacco: Never  ?Vaping Use  ? Vaping Use: Never used  ?Substance and Sexual Activity  ? Alcohol use: Yes  ?  Alcohol/week: 3.0 standard drinks  ?  Types: 3 Standard drinks or equivalent per week  ?  Comment: social - 2 or 3 times a month 1-2 glasses of wine  ? Drug use: No  ? Sexual activity: Yes  ?  Partners: Male  ?Other Topics Concern  ? Not on file  ?Social History Narrative  ? Exercise 3 to 4 times/week walking for 45 min -1 hour  ? Patient lives at home with her husband Breanna Sharp).  ? 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 Education high school.  ? Right handed.  ? decaffeine Green Tea.  ? ?Social Determinants of Health  ? ?Financial Resource Strain: Low Risk   ? Difficulty of  Paying Living Expenses: Not hard at all  ?Food Insecurity: No Food Insecurity  ? Worried About Charity fundraiser in the Last Year: Never true  ? Ran Out of Food in the Last Year: Never true  ?Transportation Needs: No Transportation Needs  ? Lack of Transportation (Medical): No  ? Lack of Transportation (Non-Medical): No  ?Physical Activity: Sufficiently Active  ? Days of Exercise per Week: 6 days  ? Minutes of Exercise per Session: 60 min  ?Stress: No Stress Concern Present  ? Feeling of Stress : Not at all  ?Social Connections: Moderately  Integrated  ? Frequency of Communication with Friends and Family: Twice a week  ? Frequency of Social Gatherings with Friends and Family: Twice a week  ? Attends Religious Services: More than 4 times per year  ? Active Member of Clubs or Organizations: No  ? Attends Archivist Meetings: Never  ? Marital Status: Married  ? ? ?Tobacco Counseling ?Counseling given: Not Answered ? ? ?Clinical Intake: ? ?Pre-visit preparation completed: Yes ? ?Pain : No/denies pain ? ?  ? ?Nutritional Risks: None ?Diabetes: No ? ?How often do you need to have someone help you when you read instructions, pamphlets, or other written materials from your doctor or pharmacy?: 1 - Never ?What is the last grade level you completed in school?: college ? ?Diabetic?no  ? ?Interpreter Needed?: No ? ?Information entered by :: L.Firman Petrow,LPn ? ? ?Activities of Daily Living ? ?  02/15/2022  ? 10:55 AM  ?In your present state of health, do you have any difficulty performing the following activities:  ?Hearing? 0  ?Vision? 0  ?Difficulty concentrating or making decisions? 0  ?Walking or climbing stairs? 0  ?Dressing or bathing? 0  ?Doing errands, shopping? 0  ?Preparing Food and eating ? N  ?Using the Toilet? N  ?In the past six months, have you accidently leaked urine? N  ?Do you have problems with loss of bowel control? N  ?Managing your Medications? N  ?Managing your Finances? N  ?Housekeeping or managing  your Housekeeping? N  ? ? ?Patient Care Team: ?Libby Maw, MD as PCP - General (Family Medicine) ?Pixie Casino, MD as PCP - Cardiology (Cardiology) ? ?Indicate any recent Medical Service

## 2022-03-07 DIAGNOSIS — Z20822 Contact with and (suspected) exposure to covid-19: Secondary | ICD-10-CM | POA: Diagnosis not present

## 2022-03-09 ENCOUNTER — Telehealth: Payer: Self-pay | Admitting: Family Medicine

## 2022-03-09 DIAGNOSIS — J301 Allergic rhinitis due to pollen: Secondary | ICD-10-CM

## 2022-03-09 DIAGNOSIS — J449 Chronic obstructive pulmonary disease, unspecified: Secondary | ICD-10-CM

## 2022-03-09 MED ORDER — FLUTICASONE PROPIONATE 50 MCG/ACT NA SUSP: 2.0000 | Freq: Every day | NASAL | 6 refills | Status: DC

## 2022-03-09 MED ORDER — FLUTICASONE PROPIONATE 50 MCG/ACT NA SUSP
2.0000 | Freq: Every day | NASAL | 6 refills | Status: DC
Start: 2022-03-09 — End: 2023-06-04

## 2022-03-09 MED ORDER — SPIRIVA RESPIMAT 2.5 MCG/ACT IN AERS: 2.0000 | INHALATION_SPRAY | Freq: Every day | RESPIRATORY_TRACT | 6 refills | Status: DC

## 2022-03-09 NOTE — Telephone Encounter (Signed)
done

## 2022-03-09 NOTE — Telephone Encounter (Signed)
Caller Name: mirel hundal ? ?Call back phone #: [phone] ? ?MEDICATION(S): Tiotropium Bromide Monohydrate (SPIRIVA RESPIMAT) 2.5 MCG/ACT AERS [[id number]]  and  ? ?fluticasone (FLONASE) 50 MCG/ACT nasal spray [[id number]]  ? ? ? ?Days of Med Remaining: 3 weeks left ? ?Has the patient contacted their pharmacy (YES/NO)?  Yes  ?IF YES, when and what did the pharmacy advise? Contact your pcp ?IF NO, request that the patient contact the pharmacy for the refills in the future.  ?           The pharmacy will send an electronic request (except for controlled medications). ? ?Preferred Pharmacy: [facility], [place]  ?[address] [place] [address]  ?Phone:  [phone]  Fax:  [phone]  ? ?~~~Please advise patient/caregiver to allow 2-3 business days to process RX refills. ? ?

## 2022-03-09 NOTE — Addendum Note (Signed)
Addended by: Lynda Rainwater on: 03/09/2022 02:06 PM ? ? Modules accepted: Orders ? ?

## 2022-03-14 DIAGNOSIS — Z20822 Contact with and (suspected) exposure to covid-19: Secondary | ICD-10-CM | POA: Diagnosis not present

## 2022-03-27 HISTORY — PX: GLAUCOMA SURGERY: SHX656

## 2022-03-27 HISTORY — PX: CATARACT EXTRACTION, BILATERAL: SHX1313

## 2022-03-30 DIAGNOSIS — H401121 Primary open-angle glaucoma, left eye, mild stage: Secondary | ICD-10-CM | POA: Diagnosis not present

## 2022-03-30 DIAGNOSIS — H2512 Age-related nuclear cataract, left eye: Secondary | ICD-10-CM | POA: Diagnosis not present

## 2022-03-31 DIAGNOSIS — H2511 Age-related nuclear cataract, right eye: Secondary | ICD-10-CM | POA: Diagnosis not present

## 2022-04-13 DIAGNOSIS — H401111 Primary open-angle glaucoma, right eye, mild stage: Secondary | ICD-10-CM | POA: Diagnosis not present

## 2022-04-13 DIAGNOSIS — H25041 Posterior subcapsular polar age-related cataract, right eye: Secondary | ICD-10-CM | POA: Diagnosis not present

## 2022-04-13 DIAGNOSIS — H25011 Cortical age-related cataract, right eye: Secondary | ICD-10-CM | POA: Diagnosis not present

## 2022-04-13 DIAGNOSIS — H2511 Age-related nuclear cataract, right eye: Secondary | ICD-10-CM | POA: Diagnosis not present

## 2022-04-25 ENCOUNTER — Encounter: Payer: Self-pay | Admitting: Internal Medicine

## 2022-04-26 ENCOUNTER — Other Ambulatory Visit: Payer: Self-pay | Admitting: Internal Medicine

## 2022-04-26 DIAGNOSIS — E785 Hyperlipidemia, unspecified: Secondary | ICD-10-CM

## 2022-05-03 ENCOUNTER — Other Ambulatory Visit (HOSPITAL_COMMUNITY): Payer: Self-pay | Admitting: *Deleted

## 2022-05-04 ENCOUNTER — Ambulatory Visit (HOSPITAL_COMMUNITY)
Admission: RE | Admit: 2022-05-04 | Discharge: 2022-05-04 | Disposition: A | Discharge status: Home / Self Care | Payer: Medicare Other | Source: Ambulatory Visit | Attending: Internal Medicine | Admitting: Internal Medicine

## 2022-05-04 DIAGNOSIS — E785 Hyperlipidemia, unspecified: Secondary | ICD-10-CM | POA: Insufficient documentation

## 2022-05-04 MED ORDER — INCLISIRAN SODIUM 284 MG/1.5ML ~~LOC~~ SOSY
284.0000 mg | PREFILLED_SYRINGE | Freq: Once | SUBCUTANEOUS | Status: AC
Start: 2022-05-04 — End: 2022-05-04
  Administered 2022-05-04: 284 mg via SUBCUTANEOUS

## 2022-05-04 MED ORDER — INCLISIRAN SODIUM 284 MG/1.5ML ~~LOC~~ SOSY
PREFILLED_SYRINGE | SUBCUTANEOUS | Status: AC
  Filled 2022-05-04: qty 1.5

## 2022-06-05 DIAGNOSIS — H18413 Arcus senilis, bilateral: Secondary | ICD-10-CM | POA: Diagnosis not present

## 2022-06-05 DIAGNOSIS — H401131 Primary open-angle glaucoma, bilateral, mild stage: Secondary | ICD-10-CM | POA: Diagnosis not present

## 2022-06-05 DIAGNOSIS — Z961 Presence of intraocular lens: Secondary | ICD-10-CM | POA: Diagnosis not present

## 2022-06-05 DIAGNOSIS — H04123 Dry eye syndrome of bilateral lacrimal glands: Secondary | ICD-10-CM | POA: Diagnosis not present

## 2022-06-13 DIAGNOSIS — E785 Hyperlipidemia, unspecified: Secondary | ICD-10-CM | POA: Diagnosis not present

## 2022-06-14 ENCOUNTER — Telehealth: Payer: Self-pay | Admitting: Internal Medicine

## 2022-06-14 LAB — LIPID PANEL
Chol/HDL Ratio: 2.8 ratio (ref 0.0–4.4)
Cholesterol, Total: 186 mg/dL (ref 100–199)
HDL: 67 mg/dL (ref >39)
LDL Chol Calc (NIH): 91 mg/dL (ref 0–99)
Triglycerides: 163 mg/dL — ABNORMAL HIGH (ref 0–149)
VLDL Cholesterol Cal: 28 mg/dL (ref 5–40)

## 2022-06-14 NOTE — Telephone Encounter (Signed)
Pt would like for nurse to callback regarding lab results. Please advise

## 2022-06-14 NOTE — Telephone Encounter (Signed)
Spoke to patient, aware once reviewed by MD will call to discuss.    She does want to report possible side effects.  She took her shot 6/8 and on 7/3 and 7/5 she has cramps and a charlie horse in her right leg, lasted approximately 10 mins.   She is unsure if this is related but wanted to report this.  She is doing fine otherwise and would like to continue taking this medication.     Will make MD aware.

## 2022-06-16 NOTE — Telephone Encounter (Signed)
Called patient, advised of message from MD.  Patient verbalized understanding.   

## 2022-06-19 ENCOUNTER — Telehealth: Payer: Self-pay | Admitting: Gastroenterology

## 2022-06-19 ENCOUNTER — Ambulatory Visit: Payer: Medicare Other | Admitting: Internal Medicine

## 2022-06-19 MED ORDER — ESOMEPRAZOLE MAGNESIUM 40 MG PO CPDR: DELAYED_RELEASE_CAPSULE | ORAL | 0 refills | Status: DC

## 2022-06-19 NOTE — Telephone Encounter (Signed)
Inbound call from patient requesting 30 day prescription for Nexium be sent to [facility] pharmacy on [address] in [place]. Patient states original pharmacy does not have prescription. Please give a callback to further advise.  Thank you

## 2022-06-19 NOTE — Telephone Encounter (Signed)
Prescription sent to patient's pharmacy per patient's request. 

## 2022-06-22 DIAGNOSIS — H6123 Impacted cerumen, bilateral: Secondary | ICD-10-CM | POA: Diagnosis not present

## 2022-06-22 DIAGNOSIS — H93293 Other abnormal auditory perceptions, bilateral: Secondary | ICD-10-CM | POA: Diagnosis not present

## 2022-06-27 ENCOUNTER — Ambulatory Visit
Admission: RE | Admit: 2022-06-27 | Discharge: 2022-06-27 | Disposition: A | Discharge status: Home / Self Care | Payer: Medicare Other | Source: Ambulatory Visit | Attending: Family Medicine | Admitting: Family Medicine

## 2022-06-27 DIAGNOSIS — M8580 Other specified disorders of bone density and structure, unspecified site: Secondary | ICD-10-CM

## 2022-06-27 DIAGNOSIS — M810 Age-related osteoporosis without current pathological fracture: Secondary | ICD-10-CM

## 2022-06-27 DIAGNOSIS — M85851 Other specified disorders of bone density and structure, right thigh: Secondary | ICD-10-CM | POA: Diagnosis not present

## 2022-06-27 DIAGNOSIS — Z780 Asymptomatic menopausal state: Secondary | ICD-10-CM | POA: Diagnosis not present

## 2022-07-10 DIAGNOSIS — H401131 Primary open-angle glaucoma, bilateral, mild stage: Secondary | ICD-10-CM | POA: Diagnosis not present

## 2022-07-17 ENCOUNTER — Ambulatory Visit (INDEPENDENT_AMBULATORY_CARE_PROVIDER_SITE_OTHER): Payer: Medicare Other | Admitting: Family Medicine

## 2022-07-17 ENCOUNTER — Encounter: Payer: Self-pay | Admitting: Family Medicine

## 2022-07-17 ENCOUNTER — Telehealth: Payer: Self-pay | Admitting: Family Medicine

## 2022-07-17 VITALS — BP 146/78 | HR 92 | Temp 97.5°F | Ht 63.0 in | Wt 130.4 lb

## 2022-07-17 DIAGNOSIS — R7303 Prediabetes: Secondary | ICD-10-CM | POA: Diagnosis not present

## 2022-07-17 DIAGNOSIS — I10 Essential (primary) hypertension: Secondary | ICD-10-CM

## 2022-07-17 DIAGNOSIS — M8580 Other specified disorders of bone density and structure, unspecified site: Secondary | ICD-10-CM | POA: Diagnosis not present

## 2022-07-17 LAB — BASIC METABOLIC PANEL
BUN: 13 mg/dL (ref 6–23)
CO2: 27 mEq/L (ref 19–32)
Calcium: 10 mg/dL (ref 8.4–10.5)
Chloride: 101 mEq/L (ref 96–112)
Creatinine, Ser: 0.8 mg/dL (ref 0.40–1.20)
GFR: 73.66 mL/min (ref >60.00)
Glucose, Bld: 101 mg/dL — ABNORMAL HIGH (ref 70–99)
Potassium: 4 mEq/L (ref 3.5–5.1)
Sodium: 137 mEq/L (ref 135–145)

## 2022-07-17 LAB — HEMOGLOBIN A1C: Hgb A1c MFr Bld: 5.6 % (ref 4.6–6.5)

## 2022-07-17 NOTE — Progress Notes (Signed)
Established Patient Office Visit  Subjective   Patient ID: ANJOLI DIEMER, female    DOB: [birth year]  Age: 71 y.o. MRN: [id number]  Chief Complaint  Patient presents with   Follow-up    6 mth f/u HTN Pt  is fasting. No concerns.    HPI follow-up of hypertension, osteopenia and prediabetes.  Continues to walk daily.  She has been able to lose a little bit of weight.  Blood pressure at home runs in the 110s over 70s range.  She has brought her cuff to the clinic before and actually was higher here.  Seems to be a whitecoat component.  Continues follow-up with cardiology cholesterol with treatment with Leqvio.  Recent cataract surgery has helped her far vision but not her near vision    Review of Systems  Constitutional:  Negative for chills, diaphoresis, malaise/fatigue and weight loss.  HENT: Negative.    Eyes: Negative.  Negative for blurred vision and double vision.  Respiratory: Negative.    Cardiovascular:  Negative for chest pain.  Gastrointestinal:  Negative for abdominal pain.  Genitourinary: Negative.   Musculoskeletal:  Negative for falls and myalgias.  Neurological:  Negative for speech change, loss of consciousness and weakness.  Psychiatric/Behavioral: Negative.        Objective:     BP (!) 146/78 (BP Location: Left Arm, Patient Position: Sitting, Cuff Size: Normal)   Pulse 92   Temp (!) 97.5 F (36.4 C) (Temporal)   Ht '5\' 3"'$  (1.6 m)   Wt 130 lb 6.4 oz (59.1 kg)   SpO2 92%   BMI 23.10 kg/m  BP Readings from Last 3 Encounters:  07/17/22 (!) 146/78  05/04/22 (!) 162/86  01/17/22 (!) 146/74   Wt Readings from Last 3 Encounters:  07/17/22 130 lb 6.4 oz (59.1 kg)  01/17/22 132 lb 9.6 oz (60.1 kg)  12/16/21 136 lb (61.7 kg)      Physical Exam Constitutional:      General: She is not in acute distress.    Appearance: Normal appearance. She is not ill-appearing, toxic-appearing or diaphoretic.  HENT:     Head: Normocephalic and atraumatic.     Right  Ear: External ear normal.     Left Ear: External ear normal.     Mouth/Throat:     Mouth: Mucous membranes are moist.     Pharynx: Oropharynx is clear. No oropharyngeal exudate or posterior oropharyngeal erythema.  Eyes:     General: No scleral icterus.       Right eye: No discharge.        Left eye: No discharge.     Extraocular Movements: Extraocular movements intact.     Conjunctiva/sclera: Conjunctivae normal.     Pupils: Pupils are equal, round, and reactive to light.  Cardiovascular:     Rate and Rhythm: Normal rate and regular rhythm.  Pulmonary:     Effort: Pulmonary effort is normal. No respiratory distress.     Breath sounds: Normal breath sounds.  Abdominal:     General: Bowel sounds are normal.     Tenderness: There is no abdominal tenderness. There is no guarding.  Musculoskeletal:     Cervical back: No rigidity or tenderness.  Skin:    General: Skin is warm and dry.  Neurological:     Mental Status: She is alert and oriented to person, place, and time.  Psychiatric:        Mood and Affect: Mood normal.  Behavior: Behavior normal.      Results for orders placed or performed in visit on 07/17/22  Hemoglobin A1c  Result Value Ref Range   Hgb A1c MFr Bld 5.6 4.6 - 6.5 %  Basic metabolic panel  Result Value Ref Range   Sodium 137 135 - 145 mEq/L   Potassium 4.0 3.5 - 5.1 mEq/L   Chloride 101 96 - 112 mEq/L   CO2 27 19 - 32 mEq/L   Glucose, Bld 101 (H) 70 - 99 mg/dL   BUN 13 6 - 23 mg/dL   Creatinine, Ser 0.80 0.40 - 1.20 mg/dL   GFR 73.66 >60.00 mL/min   Calcium 10.0 8.4 - 10.5 mg/dL      The 10-year ASCVD risk score (Arnett DK, et al., 2019) is: 16.1%    Assessment & Plan:   Problem List Items Addressed This Visit       Cardiovascular and Mediastinum   White coat syndrome with diagnosis of hypertension   Relevant Orders   Basic metabolic panel (Completed)     Musculoskeletal and Integument   Osteopenia   Relevant Medications    Cholecalciferol (VITAMIN D-1000 MAX ST) 25 MCG (1000 UT) tablet     Other   Prediabetes - Primary   Relevant Orders   Hemoglobin A1c (Completed)   Basic metabolic panel (Completed)    No follow-ups on file.  Return in September for vaccination.  She will bring her blood pressure cuff to compare with ours. .  Otherwise she will return in 6 months 1200 mg calcium 1000 international unit units of D.  Continue Zestoretic.  Libby Maw, MD

## 2022-07-17 NOTE — Telephone Encounter (Signed)
I spoke with the patient about future visit advise to schedule a normal.

## 2022-07-17 NOTE — Telephone Encounter (Signed)
Caller Name: Karstyn Birkey Call back phone #: [phone]  Reason for Call: Pt was seen today (8/21) and was unsure if she needed to schedule a fu with Dr. Ethelene Hal.

## 2022-07-21 ENCOUNTER — Other Ambulatory Visit: Payer: Self-pay | Admitting: Family Medicine

## 2022-07-21 DIAGNOSIS — Z1231 Encounter for screening mammogram for malignant neoplasm of breast: Secondary | ICD-10-CM

## 2022-07-26 DIAGNOSIS — H401131 Primary open-angle glaucoma, bilateral, mild stage: Secondary | ICD-10-CM | POA: Diagnosis not present

## 2022-07-26 DIAGNOSIS — H59033 Cystoid macular edema following cataract surgery, bilateral: Secondary | ICD-10-CM | POA: Diagnosis not present

## 2022-07-26 DIAGNOSIS — H04123 Dry eye syndrome of bilateral lacrimal glands: Secondary | ICD-10-CM | POA: Diagnosis not present

## 2022-07-31 ENCOUNTER — Other Ambulatory Visit: Payer: Self-pay | Admitting: Family Medicine

## 2022-08-03 ENCOUNTER — Encounter: Payer: Self-pay | Admitting: Family Medicine

## 2022-08-03 ENCOUNTER — Ambulatory Visit (INDEPENDENT_AMBULATORY_CARE_PROVIDER_SITE_OTHER): Payer: Medicare Other

## 2022-08-03 VITALS — BP 148/83

## 2022-08-03 DIAGNOSIS — Z23 Encounter for immunization: Secondary | ICD-10-CM | POA: Diagnosis not present

## 2022-08-03 NOTE — Progress Notes (Signed)
.  After obtaining consent, and per orders of Dr. Ethelene Hal, injection of Influenza Vaccine given by Augustina Mood. Patient instructed to remain in clinic for 20 minutes afterwards, and to report any adverse reaction to me immediately.

## 2022-08-18 ENCOUNTER — Other Ambulatory Visit: Payer: Self-pay | Admitting: Family Medicine

## 2022-08-18 DIAGNOSIS — I10 Essential (primary) hypertension: Secondary | ICD-10-CM

## 2022-08-22 ENCOUNTER — Encounter: Payer: Self-pay | Admitting: Gastroenterology

## 2022-08-24 DIAGNOSIS — H59033 Cystoid macular edema following cataract surgery, bilateral: Secondary | ICD-10-CM | POA: Diagnosis not present

## 2022-08-31 ENCOUNTER — Ambulatory Visit (INDEPENDENT_AMBULATORY_CARE_PROVIDER_SITE_OTHER): Payer: Medicare Other | Admitting: Internal Medicine

## 2022-08-31 ENCOUNTER — Encounter (HOSPITAL_BASED_OUTPATIENT_CLINIC_OR_DEPARTMENT_OTHER): Payer: Self-pay | Admitting: Internal Medicine

## 2022-08-31 VITALS — BP 142/77 | HR 95 | Ht 63.0 in | Wt 133.4 lb

## 2022-08-31 DIAGNOSIS — I2510 Atherosclerotic heart disease of native coronary artery without angina pectoris: Secondary | ICD-10-CM | POA: Diagnosis not present

## 2022-08-31 DIAGNOSIS — T466X5D Adverse effect of antihyperlipidemic and antiarteriosclerotic drugs, subsequent encounter: Secondary | ICD-10-CM

## 2022-08-31 DIAGNOSIS — M791 Myalgia, unspecified site: Secondary | ICD-10-CM

## 2022-08-31 DIAGNOSIS — E785 Hyperlipidemia, unspecified: Secondary | ICD-10-CM | POA: Diagnosis not present

## 2022-08-31 DIAGNOSIS — I2584 Coronary atherosclerosis due to calcified coronary lesion: Secondary | ICD-10-CM

## 2022-08-31 NOTE — Patient Instructions (Signed)
Medication Instructions:  NO CHANGES today   *If you need a refill on your cardiac medications before your next appointment, please call your pharmacy*   Lab Work: FASTING lab work next week   FASTING lab work in 6 months -- before next visit  If you have labs (blood work) drawn today and your tests are completely normal, you will receive your results only by: [employer] (if you have MyChart) OR A paper copy in the mail If you have any lab test that is abnormal or we need to change your treatment, we will call you to review the results.    Follow-Up: At [facility], you and your health needs are our priority.  As part of our continuing mission to provide you with exceptional heart care, we have created designated Provider Care Teams.  These Care Teams include your primary Cardiologist (physician) and Advanced Practice Providers (APPs -  Physician Assistants and Nurse Practitioners) who all work together to provide you with the care you need, when you need it.  We recommend signing up for the patient portal called "MyChart".  Sign up information is provided on this After Visit Summary.  MyChart is used to connect with patients for Virtual Visits (Telemedicine).  Patients are able to view lab/test results, encounter notes, upcoming appointments, etc.  Non-urgent messages can be sent to your provider as well.   To learn more about what you can do with MyChart, go to [email or web address].    Your next appointment:   6 months with Dr. Debara Pickett

## 2022-08-31 NOTE — Progress Notes (Signed)
OFFICE NOTE  Chief Complaint:  Routine follow-up  Primary Care Physician: Libby Maw, MD  HPI:  Breanna Sharp is a pleasant 71 year old female with a history of hypertension and dyslipidemia. Unfortunately she's been intolerant to statins in the past and has failed both Lipitor Zocor Crestor and pravastatin. She is reported some increasing shortness of breath mostly when walking upstairs, however has been struggling with recurrent sinusitis and upper respiratory symptoms. Based on her risk factors, recommended metabolic testing. She underwent cardiopulmonary testing on 04/22/2013. She had maximal effort of 1.08 our ER. Peak VO2 was 108% predicted. Heart rate was 94% predicted. Her heart rate in view to curves were essentially normal with late flattening of her view to curve. Overall the study is low risk. She also went underwent palmar he function testing which showed normal diffusion, volume and flow loops. At her last visit she started taking Livalo samples due to an abnormal lipid profile. Her total cholesterol was 213, triglycerides 122, HDL 55 and LDL 134. Over the intial month on Livalo, she seemed to tolerate it.   Then she reported some right flank pain, especially when lifting weights, which he had started to do at the same time. She stopped her cholesterol medicine and stopped her exercise and her symptoms improved. She then restarted her exercise and had symptoms and realized that it was not likely the cholesterol medicine that was causing her right side pain. Her repeat cholesterol profile was abnormally high on only Zetia monotherapy, as she discontinued Livalo due to cramps. Her LDL particle number was 3189, LDL content was 190, HDL C. was 60 and triglycerides 148.    Breanna Sharp returns today for followup of her lipid profile. Her lipid profile in April was markedly improved with an LDL particle #1594, LDL content 105, HDL 51 and triglycerides 142. She reports some  improvement in her shortness of breath and has managed to work on exercise and has had 12 pounds of weight loss.  I saw Breanna Sharp back in the office today. Overall she is doing well except she still has some shortness of breath despite weight loss, particularly when walking up inclines. We will not been able to really establish the reason for this. Heart rate did go up to a very high level with exercise, 94% predicted on cardiopulmonary exercise testing in 2014. Is not clear whether this is an arrhythmia or just sinus tachycardia. This could explain why she feels worse when walking up hills. I like to see if we can re-create this with exercise treadmill stress testing. If there is a sharp increase in heart rate with exercise, she may benefit from a low-dose beta blocker.  05/11/2016  Breanna Sharp returns today for follow-up. She is without any significant complaints. Blood pressure was mildly elevated at 133/87 however repeat was 128/78. She is not having any problems on that area and Livalo. She is due for repeat cholesterol test. After an extensive workup we cannot find a cause from a cardiac standpoint of her shortness of breath. [facility] was [facility] and saw Dr. Vaughan Browner who performed palmar function test and feels that she might have some COPD. She's been on Spiriva and reports improvement in her shortness of breath.  06/06/2017  Breanna Sharp was seen today in follow-up. She is overall without complaints. Denies any shortness of breath or chest pain. She reports her COPD is improved significantly. She's managed to lose some weight and exercising regularly. Blood pressure  is reasonable today 134/72.  08/27/2018  Breanna Sharp returns today for follow-up.  She is done extremely well over the past year.  Her shortness of breath has been improved on inhalers.  Her COPD is stable.  Weight is fairly stable.  She exercises regularly walking between 4 and 8 miles a day.  She denies  any chest pain.  She is also changed her eating habits and recently had a repeat lipid profile which is the best that she is ever seen.  Her total cholesterol is 167, triglycerides 107, HDL 57 LDL 89.  08/28/2019  Breanna Sharp seen today for annual follow-up.  Overall she continues to do well.  She is had no worsening COPD.  She does get a little congestion which is seasonal.  She continues to walk 5 to 7 miles almost every day.  She denies any claudication.  She denies any worsening shortness of breath or chest pain.  Her lipids have been well controlled and LDL is a little higher at 103 but stable compared to study 7 months ago.  She was much better controlled about a year ago with LDL at 89.  She is on both Livalo and ezetimibe.  09/13/2020  Breanna Sharp is seen today for routine follow-up.  She reports her breathing is fairly stable with regards to COPD.  She continues to walk and is asymptomatic.  Her cholesterol still remains higher than target.  She is on ezetimibe and Livalo 1 mg.  She had previously been on the 4 mg dose with some side effects.  Her LDL C was 126 as of August 2021.  I would like to see her below 50.  EKG shows normal sinus rhythm.  12/16/2021  Breanna Sharp returns today for follow-up.  She denies any worsening chest pain or shortness of breath.  Unfortunately she cannot tolerate the Livalo when the dose was increased up to 2 mg.  She is been having some achiness in her legs.  Cholesterol remains elevated.  Given her coronary calcification, we will try to target LDL less than 100 or even possibly less than 70.  08/31/2022  Breanna Sharp is seen today for follow-up.  She is doing well on Leqvio injections.  She was able to get them fully covered by her insurance.  She has now had the 2 loading doses and is due for every 72-monthdose in November.  Her labs were checked about a month after her second dose.  LDL at the time was 91.  This is down from 126 previously.  She  remains on Zetia but had to stop Livalo due to side effects which were cramps.  Those have resolved.   PMHx:  Past Medical History:  Diagnosis Date   Allergy    Anxiety    Arthritis    [place]    Dyslipidemia    statin intolerance   GERD (gastroesophageal reflux disease)    Heart murmur    Hyperlipidemia    Hypertension    Vertigo     Past Surgical History:  Procedure Laterality Date   [place] N/A 09/29/2019   Procedure: 24 HOUR PH STUDY;  Surgeon: NMauri Pole MD;  Location: [facility];  Service: Endoscopy;  Laterality: N/A;   ABDOMINAL HYSTERECTOMY  1972   cervical cancer   BIOPSY  11/24/2019   Procedure: BIOPSY;  Surgeon: NMauri Pole MD;  Location: [facility];  Service: Endoscopy;;   BRAVO [place] N/A 11/24/2019   Procedure:  BRAVO PH STUDY;  Surgeon: Mauri Pole, MD;  [facility]: [facility];  Service: Endoscopy;  Laterality: N/A;   COLONOSCOPY     ESOPHAGEAL MANOMETRY N/A 09/29/2019   Procedure: ESOPHAGEAL MANOMETRY (EM);  Surgeon: Mauri Pole, MD;  [facility];  Service: Endoscopy;  Laterality: N/A;   ESOPHAGOGASTRODUODENOSCOPY (EGD) WITH PROPOFOL N/A 11/24/2019   Procedure: ESOPHAGOGASTRODUODENOSCOPY (EGD) WITH PROPOFOL;  Surgeon: Mauri Pole, MD;  [facility];  Service: Endoscopy;  Laterality: N/A;   WISDOM TOOTH EXTRACTION     uppers    FAMHx:  Family History  Problem Relation Age of Onset   Hypertension Mother        dx in her 63's   Heart attack Mother 38   Breast cancer Mother        unsure of age   Heart attack Brother 76   Diabetes Brother    Hyperlipidemia Brother    Hypertension Brother    Heart disease Brother    Colon cancer Neg Hx    Esophageal cancer Neg Hx    Rectal cancer Neg Hx    Stomach cancer Neg Hx     SOCHx:   reports that she quit smoking about 19 years ago. Her smoking use included cigarettes. She has a 16.00 pack-year smoking history. She has never  used smokeless tobacco. She reports current alcohol use of about 3.0 standard drinks of alcohol per week. She reports that she does not use drugs.  ALLERGIES:  Allergies  Allergen Reactions   Statins     Muscle cramps    ROS: Pertinent items noted in HPI and remainder of comprehensive ROS otherwise negative.  HOME MEDS: Current Outpatient Medications  Medication Sig Dispense Refill   acetaminophen (TYLENOL) 650 MG CR tablet Take 1,300 mg by mouth every 8 (eight) hours as needed for pain.     Calcium Carb-Cholecalciferol (CALCIUM + D3 PO) Take 1 tablet by mouth daily.     Carboxymethylcellul-Glycerin (LUBRICATING EYE DROPS OP) Place 2 drops into both eyes daily.     Cholecalciferol (VITAMIN D-1000 MAX ST) 25 MCG (1000 UT) tablet Take by mouth.     esomeprazole (NEXIUM) 40 MG capsule TAKE 1 CAPSULE DAILY BEFORE BREAKFAST 30 capsule 0   ezetimibe (ZETIA) 10 MG tablet TAKE 1 TABLET DAILY 90 tablet 3   fexofenadine (ALLEGRA) 180 MG tablet Take 180 mg by mouth daily.      FIBER PO Take by mouth.     fluticasone (FLONASE) 50 MCG/ACT nasal spray Place 2 sprays into both nostrils daily. 48 g 6   inclisiran (LEQVIO) 284 MG/1.5ML SOSY injection Inject 284 mg into the skin as directed.     lisinopril-hydrochlorothiazide (ZESTORETIC) 20-12.5 MG tablet TAKE 1 TABLET DAILY 90 tablet 3   Multiple Vitamin (MULTIVITAMIN WITH MINERALS) TABS tablet Take 1 tablet by mouth daily.     Netarsudil-Latanoprost (ROCKLATAN) 0.02-0.005 % SOLN Apply to eye.     Tiotropium Bromide Monohydrate (SPIRIVA RESPIMAT) 2.5 MCG/ACT AERS Take 2 puffs by mouth daily. 12 g 6   No current facility-administered medications for this visit.    LABS/IMAGING: No results found for this or any previous visit (from the past 48 hour(s)). No results found.  VITALS: BP (!) 142/77 (BP Location: Left Arm, Patient Position: Sitting, Cuff Size: Normal)   Pulse 95   Ht '5\' 3"'$  (1.6 m)   Wt 133 lb 6.4 oz (60.5 kg)   SpO2 98%   BMI  23.63 kg/m   EXAM: Deferred  EKG: Deferred  ASSESSMENT: Dyslipidemia, goal LDL less than 70 Coronary artery calcification/ASCVD 3.   Hypertension 4.   COPD - Gold A 5.   Statin intolerance-myalgias  PLAN: 1.   Mrs. Brunner seems to be tolerating Leqvio much better.  She remains on Zetia but had to stop pravastatin due to cramps and myalgias.  It is difficult to assess the effect of the Leqvio with regards to her cholesterol as it has gone up and down.  Her last labs were about a month after her injection.  She is due for another injection in November.  I would like to check her cholesterol again next week.  We will get an NMR and LP(a).  Ultimately if her LDL is not much lower, we may need to consider additional therapy such as Nexletol.  Follow-up in 6 months.   Pixie Casino, MD, [facility], [place] Director of the Advanced Lipid Disorders &  Cardiovascular Risk Reduction Clinic Diplomate of the American Board of Clinical Lipidology Attending Cardiologist  Direct Dial: [phone]  Fax: [phone]  Website:  [email or web address] Teagyn Fishel 08/31/2022, 1:14 PM

## 2022-09-01 ENCOUNTER — Ambulatory Visit (AMBULATORY_SURGERY_CENTER): Payer: Self-pay

## 2022-09-01 VITALS — Ht 63.0 in | Wt 133.0 lb

## 2022-09-01 DIAGNOSIS — Z86010 Personal history of colonic polyps: Secondary | ICD-10-CM

## 2022-09-01 MED ORDER — NA SULFATE-K SULFATE-MG SULF 17.5-3.13-1.6 GM/177ML PO SOLN: 1.0000 | Freq: Once | ORAL | 0 refills | Status: AC

## 2022-09-01 NOTE — Progress Notes (Signed)
No egg or soy allergy known to patient  No issues known to pt with past sedation with any surgeries or procedures- Patient denies ever being told they had issues or difficulty with intubation  No FH of Malignant Hyperthermia Pt is not on diet pills Pt is not on home 02  Pt is not on blood thinners  Pt denies issues with constipation;  No A fib or A flutter Have any cardiac testing pending--NO Pt instructed to use Singlecare.com or GoodRx for a price reduction on prep  Insurance verified during PV appt=Medicare A/B and Tricare [facility] -GoodRx coupon given to patient during PV appt;

## 2022-09-04 DIAGNOSIS — E785 Hyperlipidemia, unspecified: Secondary | ICD-10-CM | POA: Diagnosis not present

## 2022-09-05 LAB — NMR, LIPOPROFILE
Cholesterol, Total: 207 mg/dL — ABNORMAL HIGH (ref 100–199)
HDL Particle Number: 45.3 umol/L (ref >=30.5)
HDL-C: 57 mg/dL (ref >39)
LDL Particle Number: 1405 nmol/L — ABNORMAL HIGH (ref <1000)
LDL Size: 20.2 nm — ABNORMAL LOW (ref >20.5)
LDL-C (NIH Calc): 106 mg/dL — ABNORMAL HIGH (ref 0–99)
LP-IR Score: 52 — ABNORMAL HIGH (ref <=45)
Small LDL Particle Number: 855 nmol/L — ABNORMAL HIGH (ref <=527)
Triglycerides: 257 mg/dL — ABNORMAL HIGH (ref 0–149)

## 2022-09-05 LAB — LIPOPROTEIN A (LPA): Lipoprotein (a): 130.9 nmol/L — ABNORMAL HIGH (ref <75.0)

## 2022-09-08 ENCOUNTER — Other Ambulatory Visit: Payer: Self-pay | Admitting: Internal Medicine

## 2022-09-08 DIAGNOSIS — E785 Hyperlipidemia, unspecified: Secondary | ICD-10-CM

## 2022-09-13 ENCOUNTER — Ambulatory Visit
Admission: RE | Admit: 2022-09-13 | Discharge: 2022-09-13 | Disposition: A | Discharge status: Home / Self Care | Payer: Medicare Other | Source: Ambulatory Visit | Attending: Family Medicine | Admitting: Family Medicine

## 2022-09-13 DIAGNOSIS — Z1231 Encounter for screening mammogram for malignant neoplasm of breast: Secondary | ICD-10-CM | POA: Diagnosis not present

## 2022-09-19 ENCOUNTER — Encounter: Payer: Self-pay | Admitting: Gastroenterology

## 2022-09-19 ENCOUNTER — Ambulatory Visit (AMBULATORY_SURGERY_CENTER): Payer: Medicare Other | Admitting: Gastroenterology

## 2022-09-19 VITALS — BP 124/65 | HR 69 | Temp 97.3°F | Resp 11 | Ht 63.0 in | Wt 133.0 lb

## 2022-09-19 DIAGNOSIS — Z09 Encounter for follow-up examination after completed treatment for conditions other than malignant neoplasm: Secondary | ICD-10-CM

## 2022-09-19 DIAGNOSIS — I10 Essential (primary) hypertension: Secondary | ICD-10-CM | POA: Diagnosis not present

## 2022-09-19 DIAGNOSIS — F419 Anxiety disorder, unspecified: Secondary | ICD-10-CM | POA: Diagnosis not present

## 2022-09-19 DIAGNOSIS — E785 Hyperlipidemia, unspecified: Secondary | ICD-10-CM | POA: Diagnosis not present

## 2022-09-19 DIAGNOSIS — Z86010 Personal history of colonic polyps: Secondary | ICD-10-CM | POA: Diagnosis not present

## 2022-09-19 MED ORDER — SODIUM CHLORIDE 0.9 % IV SOLN
500.0000 mL | Freq: Once | INTRAVENOUS | Status: DC
Start: 2022-09-19 — End: 2022-09-19

## 2022-09-19 NOTE — Progress Notes (Signed)
[facility] History and Physical   Primary Care Physician:  Libby Maw, MD   Reason for Procedure:  History of adenomatous colon polyps  Plan:    Surveillance colonoscopy with possible interventions as needed     HPI: Breanna Sharp is a very pleasant 71 y.o. female here for surveillance colonoscopy. Denies any nausea, vomiting, abdominal pain, melena or bright red blood per rectum  The risks and benefits as well as alternatives of endoscopic procedure(s) have been discussed and reviewed. All questions answered. The patient agrees to proceed.    Past Medical History:  Diagnosis Date   Anxiety    Arthritis    Cataract    bilaterqal sx   Dyslipidemia    statin intolerance   GERD (gastroesophageal reflux disease)    on meds   Glaucoma    bilateral sx   Heart murmur    Hyperlipidemia    on meds   Hypertension    on meds   Seasonal allergies    Uterine cancer ([place]) 1972   hysterectomy-cervical cancer   Vertigo     Past Surgical History:  Procedure Laterality Date   71 HOUR [place] STUDY N/A 09/29/2019   Procedure: 24 HOUR PH STUDY;  Surgeon: Mauri Pole, MD;  [facility];  Service: Endoscopy;  Laterality: N/A;   ABDOMINAL HYSTERECTOMY  1972   cervical cancer   BIOPSY  11/24/2019   Procedure: BIOPSY;  Surgeon: Mauri Pole, MD;  [facility];  Service: Endoscopy;;   BRAVO [place] STUDY N/A 11/24/2019   Procedure: BRAVO [place];  Surgeon: Mauri Pole, MD;  [facility];  Service: Endoscopy;  Laterality: N/A;   CATARACT EXTRACTION, BILATERAL Bilateral 03/2022   COLONOSCOPY  08/2017   KN-MAC-suprep(good)-tics/TA x 3   ESOPHAGEAL MANOMETRY N/A 09/29/2019   Procedure: ESOPHAGEAL MANOMETRY (EM);  Surgeon: Mauri Pole, MD;  [facility];  Service: Endoscopy;  Laterality: N/A;   ESOPHAGOGASTRODUODENOSCOPY (EGD) WITH PROPOFOL N/A 11/24/2019   Procedure: ESOPHAGOGASTRODUODENOSCOPY  (EGD) WITH PROPOFOL;  Surgeon: Mauri Pole, MD;  [facility];  Service: Endoscopy;  Laterality: N/A;   GLAUCOMA SURGERY Bilateral 03/2022   POLYPECTOMY  2018   TA x 3   WISDOM TOOTH EXTRACTION     uppers    Prior to Admission medications   Medication Sig Start Date End Date Taking? Authorizing Provider  acetaminophen (TYLENOL) 650 MG CR tablet Take 1,300 mg by mouth every 8 (eight) hours as needed for pain.   Yes [provider]  Calcium Carb-Cholecalciferol (CALCIUM + D3 PO) Take 2 tablets by mouth daily. Calcium 1200 mg and Vitamin D3 1000 IU   Yes [provider]  Carboxymethylcellul-Glycerin (LUBRICATING EYE DROPS OP) Place 2 drops into both eyes daily. IVIZIA   Yes [provider]  esomeprazole (NEXIUM) 40 MG capsule TAKE 1 CAPSULE DAILY BEFORE BREAKFAST 06/19/22  Yes Damarco Keysor, Venia Minks, MD  ezetimibe (ZETIA) 10 MG tablet TAKE 1 TABLET DAILY 08/01/22  Yes Libby Maw, MD  fexofenadine (ALLEGRA) 180 MG tablet Take 180 mg by mouth daily.  10/04/13  Yes [provider]  FIBER PO Take 2 capsules by mouth daily at 6 (six) AM. Fiber Choice daily prebiotic fiber supplement   Yes [provider]  fluticasone (FLONASE) 50 MCG/ACT nasal spray Place 2 sprays into both nostrils daily. 03/09/22  Yes Libby Maw, MD  ketorolac (ACULAR) 0.5 % ophthalmic solution Place 1 drop into the left eye 4 (four)  times daily.   Yes [provider]  lisinopril-hydrochlorothiazide (ZESTORETIC) 20-12.5 MG tablet TAKE 1 TABLET DAILY 08/18/22  Yes Libby Maw, MD  Multiple Vitamin (MULTIVITAMIN WITH MINERALS) TABS tablet Take 1 tablet by mouth daily.   Yes [provider]  Netarsudil-Latanoprost (ROCKLATAN) 0.02-0.005 % SOLN Apply 1 drop to eye at bedtime.   Yes [provider]  Tiotropium Bromide Monohydrate (SPIRIVA RESPIMAT) 2.5 MCG/ACT AERS Take 2 puffs by mouth daily. 03/09/22  Yes Libby Maw, MD  fluorometholone (FML) 0.1 % ophthalmic suspension Place 1 drop into both eyes daily as needed. Patient not taking: Reported on 09/01/2022 08/28/22   [provider]  inclisiran (LEQVIO) 284 MG/1.5ML SOSY injection Inject 284 mg into the skin every 6 (six) months.    [provider]  Na Sulfate-K Sulfate-Mg Sulf 17.5-3.13-1.6 GM/177ML SOLN SMARTSIG:1 Kit(s) By Mouth Once 09/01/22   [provider]    Current Outpatient Medications  Medication Sig Dispense Refill   acetaminophen (TYLENOL) 650 MG CR tablet Take 1,300 mg by mouth every 8 (eight) hours as needed for pain.     Calcium Carb-Cholecalciferol (CALCIUM + D3 PO) Take 2 tablets by mouth daily. Calcium 1200 mg and Vitamin D3 1000 IU     Carboxymethylcellul-Glycerin (LUBRICATING EYE DROPS OP) Place 2 drops into both eyes daily. IVIZIA     esomeprazole (NEXIUM) 40 MG capsule TAKE 1 CAPSULE DAILY BEFORE BREAKFAST 30 capsule 0   ezetimibe (ZETIA) 10 MG tablet TAKE 1 TABLET DAILY 90 tablet 3   fexofenadine (ALLEGRA) 180 MG tablet Take 180 mg by mouth daily.      FIBER PO Take 2 capsules by mouth daily at 6 (six) AM. Fiber Choice daily prebiotic fiber supplement     fluticasone (FLONASE) 50 MCG/ACT nasal spray Place 2 sprays into both nostrils daily. 48 g 6   ketorolac (ACULAR) 0.5 % ophthalmic solution Place 1 drop into the left eye 4 (four) times daily.     lisinopril-hydrochlorothiazide (ZESTORETIC) 20-12.5 MG tablet TAKE 1 TABLET DAILY 90 tablet 3   Multiple Vitamin (MULTIVITAMIN WITH MINERALS) TABS tablet Take 1 tablet by mouth daily.     Netarsudil-Latanoprost (ROCKLATAN) 0.02-0.005 % SOLN Apply 1 drop to eye at bedtime.     Tiotropium Bromide Monohydrate (SPIRIVA RESPIMAT) 2.5 MCG/ACT AERS Take 2 puffs by mouth daily. 12 g 6   fluorometholone (FML) 0.1 % ophthalmic suspension Place 1 drop into both eyes daily as needed. (Patient not taking: Reported on 09/01/2022)     inclisiran (LEQVIO) 284 MG/1.5ML SOSY  injection Inject 284 mg into the skin every 6 (six) months.     Na Sulfate-K Sulfate-Mg Sulf 17.5-3.13-1.6 GM/177ML SOLN SMARTSIG:1 Kit(s) By Mouth Once     Current Facility-Administered Medications  Medication Dose Route Frequency Provider Last Rate Last Admin   0.9 %  sodium chloride infusion  500 mL Intravenous Once Nataliah Hatlestad, Venia Minks, MD        Allergies as of 09/19/2022 - Review Complete 09/19/2022  Allergen Reaction Noted   Statins  05/15/2012    Family History  Problem Relation Age of Onset   Hypertension Mother        dx in her 55's   Heart attack Mother 44   Breast cancer Mother        unsure of age   Colon polyps Sister 104   Heart attack Brother 20   Diabetes Brother    Hyperlipidemia Brother    Hypertension Brother    Heart  disease Brother    Colon cancer Neg Hx    Esophageal cancer Neg Hx    Rectal cancer Neg Hx    Stomach cancer Neg Hx     Social History   Socioeconomic History   Marital status: Married    Spouse name: [name]   Number of children: 0   Years of education: 12   Highest education level: Not on file  Occupational History    Employer: RETIRED    Comment: Retired  Tobacco Use   Smoking status: Former    Packs/day: 0.50    Years: 32.00    Total pack years: 16.00    Types: Cigarettes    Quit date: 11/27/2002    Years since quitting: 19.8   Smokeless tobacco: Never  Vaping Use   Vaping Use: Never used  Substance and Sexual Activity   Alcohol use: Not Currently    Alcohol/week: 0.0 - 2.0 standard drinks of alcohol    Comment: social - 2 or 3 times a month 1-2 glasses of wine   Drug use: No   Sexual activity: Yes    Partners: Male  Other Topics Concern   Not on file  Social History Narrative   Exercise 3 to 4 times/week walking for 45 min -1 hour   Patient lives at home with her husband Legrand [place]).   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    Right handed.   decaffeine Green Tea.   Social Determinants of Health   Financial Resource Strain: Low Risk  (02/15/2022)   Overall Financial Resource Strain (CARDIA)    Difficulty of Paying Living Expenses: Not hard at all  Food Insecurity: No Food Insecurity (02/15/2022)   Hunger Vital Sign    Worried About Running Out of Food in the Last Year: Never true    Ran Out of Food in the Last Year: Never true  Transportation Needs: No Transportation Needs (02/15/2022)   PRAPARE - Hydrologist (Medical): No    Lack of Transportation (Non-Medical): No  Physical Activity: Sufficiently Active (02/15/2022)   Exercise Vital Sign    Days of Exercise per Week: 6 days    Minutes of Exercise per Session: 60 min  Stress: No Stress Concern Present (02/15/2022)   [place]    Feeling of Stress : Not at all  Social Connections: Moderately Integrated (02/15/2022)   Social Connection and Isolation Panel [NHANES]    Frequency of Communication with Friends and Family: Twice a week    Frequency of Social Gatherings with Friends and Family: Twice a week    Attends Religious Services: More than 4 times per year    Active Member of [employer] or Organizations: No    Attends Archivist Meetings: Never    Marital Status: Married  Human resources officer Violence: Not At Risk (02/15/2022)   Humiliation, Afraid, Rape, and Kick questionnaire    Fear of Current or Ex-Partner: No    Emotionally Abused: No    Physically Abused: No    Sexually Abused: No    Review of Systems:  All other review of systems negative except as mentioned in the HPI.  Physical Exam: Vital signs in last 24 hours: Blood Pressure (Abnormal) 159/81   Pulse 79   Temperature (Abnormal) 97.3 F (36.3 C) (Temporal)   Height _0  (1.6 m)  Weight 133 lb (60.3 kg)   Oxygen Saturation 95%   Body Mass Index 23.56 kg/m  General:   Alert,  NAD Lungs:  Clear .   Heart:  Regular rate and rhythm Abdomen:  Soft, nontender and nondistended. Neuro/Psych:  Alert and cooperative. Normal mood and affect. A and O x 3  Reviewed labs, radiology imaging, old records and pertinent past GI work up  Patient is appropriate for planned procedure(s) and anesthesia in an ambulatory setting   K. Denzil Magnuson , MD [phone]

## 2022-09-19 NOTE — Patient Instructions (Signed)
HANDOUTS PROVIDED ON: DIVERTICULOSIS & HEMORRHOIDS  You may resume your previous diet and medication schedule.  Thank you for allowing [place] to care for you today!!!   YOU HAD AN ENDOSCOPIC PROCEDURE TODAY AT [facility]:   Refer to the procedure report that was given to you for any specific questions about what was found during the examination.  If the procedure report does not answer your questions, please call your gastroenterologist to clarify.  If you requested that your care partner not be given the details of your procedure findings, then the procedure report has been included in a sealed envelope for you to review at your convenience later.  YOU SHOULD EXPECT: Some feelings of bloating in the abdomen. Passage of more gas than usual.  Walking can help get rid of the air that was put into your GI tract during the procedure and reduce the bloating. If you had a lower endoscopy (such as a colonoscopy or flexible sigmoidoscopy) you may notice spotting of blood in your stool or on the toilet paper. If you underwent a bowel prep for your procedure, you may not have a normal bowel movement for a few days.  Please Note:  You might notice some irritation and congestion in your nose or some drainage.  This is from the oxygen used during your procedure.  There is no need for concern and it should clear up in a day or so.  SYMPTOMS TO REPORT IMMEDIATELY:  Following lower endoscopy (colonoscopy or flexible sigmoidoscopy):  Excessive amounts of blood in the stool  Significant tenderness or worsening of abdominal pains  Swelling of the abdomen that is new, acute  Fever of 100F or higher  For urgent or emergent issues, a gastroenterologist can be reached at any hour by calling [phone]. Do not use MyChart messaging for urgent concerns.    DIET:  We do recommend a small meal at first, but then you may proceed to your regular diet.  Drink plenty of fluids but you should avoid  alcoholic beverages for 24 hours.  ACTIVITY:  You should plan to take it easy for the rest of today and you should NOT DRIVE or use heavy machinery until tomorrow (because of the sedation medicines used during the test).    FOLLOW UP: Our staff will call the number listed on your records the next business day following your procedure.  We will call around 7:15- 8:00 am to check on you and address any questions or concerns that you may have regarding the information given to you following your procedure. If we do not reach you, we will leave a message.     If any biopsies were taken you will be contacted by phone or by letter within the next 1-3 weeks.  Please call us at [phone] if you have not heard about the biopsies in 3 weeks.    SIGNATURES/CONFIDENTIALITY: You and/or your care partner have signed paperwork which will be entered into your electronic medical record.  These signatures attest to the fact that that the information above on your After Visit Summary has been reviewed and is understood.  Full responsibility of the confidentiality of this discharge information lies with you and/or your care-partner.

## 2022-09-19 NOTE — Op Note (Signed)
[facility] Patient Name: Breanna Sharp Procedure Date: 09/19/2022 9:06 AM MRN: [id number] Endoscopist: Mauri Pole , MD Age: 71 Referring MD:  Date of Birth: 29-Sep-1951 Gender: Female Account #: [id number] Procedure:                Colonoscopy Indications:              High risk colon cancer surveillance: Personal                            history of colonic polyps, High risk colon cancer                            surveillance: Personal history of adenoma less than                            10 mm in size Medicines:                Monitored Anesthesia Care Procedure:                Pre-Anesthesia Assessment:                           - Prior to the procedure, a History and Physical                            was performed, and patient medications and                            allergies were reviewed. The patient's tolerance of                            previous anesthesia was also reviewed. The risks                            and benefits of the procedure and the sedation                            options and risks were discussed with the patient.                            All questions were answered, and informed consent                            was obtained. Prior Anticoagulants: The patient has                            taken no previous anticoagulant or antiplatelet                            agents. ASA Grade Assessment: II - A patient with                            mild systemic disease. After reviewing the risks  and benefits, the patient was deemed in                            satisfactory condition to undergo the procedure.                           After obtaining informed consent, the colonoscope                            was passed under direct vision. Throughout the                            procedure, the patient's blood pressure, pulse, and                            oxygen saturations were monitored  continuously. The                            PCF-HQ190L Colonoscope was introduced through the                            anus and advanced to the the cecum, identified by                            appendiceal orifice and ileocecal valve. The                            colonoscopy was performed without difficulty. The                            patient tolerated the procedure well. The quality                            of the bowel preparation was good. The ileocecal                            valve, appendiceal orifice, and rectum were                            photographed. Scope In: 9:23:36 AM Scope Out: 9:38:08 AM Scope Withdrawal Time: 0 hours 9 minutes 9 seconds  Total Procedure Duration: 0 hours 14 minutes 32 seconds  Findings:                 The perianal and digital rectal examinations were                            normal.                           Scattered small and large-mouthed diverticula were                            found in the sigmoid colon and descending colon.  Non-bleeding external and internal hemorrhoids were                            found during retroflexion. The hemorrhoids were                            small.                           The exam was otherwise without abnormality. Complications:            No immediate complications. Estimated Blood Loss:     Estimated blood loss: none. Impression:               - Diverticulosis in the sigmoid colon and in the                            descending colon.                           - Non-bleeding external and internal hemorrhoids.                           - The examination was otherwise normal.                           - No specimens collected. Recommendation:           - Patient has a contact number available for                            emergencies. The signs and symptoms of potential                            delayed complications were discussed with the                             patient. Return to normal activities tomorrow.                            Written discharge instructions were provided to the                            patient.                           - Resume previous diet.                           - Continue present medications.                           - No repeat colonoscopy due to age.                           - Return to GI office PRN. Mauri Pole, MD 09/19/2022 9:48:17 AM This report has been signed electronically.

## 2022-09-19 NOTE — Progress Notes (Signed)
Report to pacu rn. Vss. Care resumed by rn. 

## 2022-09-19 NOTE — Progress Notes (Signed)
VS completed by CW.   Pt's states no medical or surgical changes since previsit or office visit.  

## 2022-09-20 ENCOUNTER — Telehealth: Payer: Self-pay

## 2022-09-20 NOTE — Telephone Encounter (Signed)
  Follow up Call-     09/19/2022    8:18 AM  Call back number  Post procedure Call Back phone  # [phone]  Permission to leave phone message Yes     Patient questions:  Do you have a fever, pain , or abdominal swelling? No. Pain Score  0 *  Have you tolerated food without any problems? Yes.    Have you been able to return to your normal activities? Yes.    Do you have any questions about your discharge instructions: Diet   No. Medications  No. Follow up visit  No.  Do you have questions or concerns about your Care? No.  Actions: * If pain score is 4 or above: No action needed, pain <4.

## 2022-09-28 DIAGNOSIS — Z6823 Body mass index (BMI) 23.0-23.9, adult: Secondary | ICD-10-CM | POA: Diagnosis not present

## 2022-09-28 DIAGNOSIS — Z01419 Encounter for gynecological examination (general) (routine) without abnormal findings: Secondary | ICD-10-CM | POA: Diagnosis not present

## 2022-09-28 DIAGNOSIS — R87610 Atypical squamous cells of undetermined significance on cytologic smear of cervix (ASC-US): Secondary | ICD-10-CM | POA: Diagnosis not present

## 2022-09-28 DIAGNOSIS — G55 Nerve root and plexus compressions in diseases classified elsewhere: Secondary | ICD-10-CM | POA: Diagnosis not present

## 2022-10-12 DIAGNOSIS — H43813 Vitreous degeneration, bilateral: Secondary | ICD-10-CM | POA: Diagnosis not present

## 2022-10-12 DIAGNOSIS — H59033 Cystoid macular edema following cataract surgery, bilateral: Secondary | ICD-10-CM | POA: Diagnosis not present

## 2022-10-12 DIAGNOSIS — H401131 Primary open-angle glaucoma, bilateral, mild stage: Secondary | ICD-10-CM | POA: Diagnosis not present

## 2022-10-20 ENCOUNTER — Ambulatory Visit (HOSPITAL_COMMUNITY)
Admission: RE | Admit: 2022-10-20 | Discharge: 2022-10-20 | Disposition: A | Discharge status: Home / Self Care | Payer: Medicare Other | Source: Ambulatory Visit | Attending: Internal Medicine | Admitting: Internal Medicine

## 2022-10-20 DIAGNOSIS — E785 Hyperlipidemia, unspecified: Secondary | ICD-10-CM | POA: Diagnosis not present

## 2022-10-20 MED ORDER — INCLISIRAN SODIUM 284 MG/1.5ML ~~LOC~~ SOSY
284.0000 mg | PREFILLED_SYRINGE | Freq: Once | SUBCUTANEOUS | Status: AC
  Administered 2022-10-20: 284 mg via SUBCUTANEOUS

## 2022-10-20 MED ORDER — INCLISIRAN SODIUM 284 MG/1.5ML ~~LOC~~ SOSY
PREFILLED_SYRINGE | SUBCUTANEOUS | Status: AC
  Filled 2022-10-20: qty 1.5

## 2022-11-06 DIAGNOSIS — H6123 Impacted cerumen, bilateral: Secondary | ICD-10-CM | POA: Diagnosis not present

## 2023-01-12 DIAGNOSIS — H04123 Dry eye syndrome of bilateral lacrimal glands: Secondary | ICD-10-CM | POA: Diagnosis not present

## 2023-01-12 DIAGNOSIS — I10 Essential (primary) hypertension: Secondary | ICD-10-CM | POA: Diagnosis not present

## 2023-01-12 DIAGNOSIS — H43393 Other vitreous opacities, bilateral: Secondary | ICD-10-CM | POA: Diagnosis not present

## 2023-01-12 DIAGNOSIS — H401131 Primary open-angle glaucoma, bilateral, mild stage: Secondary | ICD-10-CM | POA: Diagnosis not present

## 2023-01-15 ENCOUNTER — Encounter: Payer: Self-pay | Admitting: Family Medicine

## 2023-01-15 ENCOUNTER — Ambulatory Visit (INDEPENDENT_AMBULATORY_CARE_PROVIDER_SITE_OTHER): Payer: Medicare Other | Admitting: Family Medicine

## 2023-01-15 VITALS — BP 130/76 | HR 100 | Temp 98.2°F | Ht 63.0 in | Wt 134.4 lb

## 2023-01-15 DIAGNOSIS — E785 Hyperlipidemia, unspecified: Secondary | ICD-10-CM | POA: Diagnosis not present

## 2023-01-15 DIAGNOSIS — I10 Essential (primary) hypertension: Secondary | ICD-10-CM

## 2023-01-15 DIAGNOSIS — R7303 Prediabetes: Secondary | ICD-10-CM | POA: Diagnosis not present

## 2023-01-15 DIAGNOSIS — E559 Vitamin D deficiency, unspecified: Secondary | ICD-10-CM

## 2023-01-15 LAB — URINALYSIS, ROUTINE W REFLEX MICROSCOPIC
Bilirubin Urine: NEGATIVE
Hgb urine dipstick: NEGATIVE
Ketones, ur: NEGATIVE
Leukocytes,Ua: NEGATIVE
Nitrite: NEGATIVE
RBC / HPF: NONE SEEN (ref 0–?)
Specific Gravity, Urine: 1.015 (ref 1.000–1.030)
Total Protein, Urine: NEGATIVE
Urine Glucose: NEGATIVE
Urobilinogen, UA: 0.2 (ref 0.0–1.0)
WBC, UA: NONE SEEN (ref 0–?)
pH: 6.5 (ref 5.0–8.0)

## 2023-01-15 LAB — LIPID PANEL
Cholesterol: 174 mg/dL (ref 0–200)
HDL: 63.5 mg/dL (ref >39.00)
LDL Cholesterol: 71 mg/dL (ref 0–99)
NonHDL: 110.9
Total CHOL/HDL Ratio: 3
Triglycerides: 198 mg/dL — ABNORMAL HIGH (ref 0.0–149.0)
VLDL: 39.6 mg/dL (ref 0.0–40.0)

## 2023-01-15 LAB — COMPREHENSIVE METABOLIC PANEL
ALT: 21 U/L (ref 0–35)
AST: 23 U/L (ref 0–37)
Albumin: 4.4 g/dL (ref 3.5–5.2)
Alkaline Phosphatase: 72 U/L (ref 39–117)
BUN: 12 mg/dL (ref 6–23)
CO2: 30 mEq/L (ref 19–32)
Calcium: 10.6 mg/dL — ABNORMAL HIGH (ref 8.4–10.5)
Chloride: 101 mEq/L (ref 96–112)
Creatinine, Ser: 0.8 mg/dL (ref 0.40–1.20)
GFR: 73.41 mL/min (ref >60.00)
Glucose, Bld: 103 mg/dL — ABNORMAL HIGH (ref 70–99)
Potassium: 4.3 mEq/L (ref 3.5–5.1)
Sodium: 139 mEq/L (ref 135–145)
Total Bilirubin: 0.5 mg/dL (ref 0.2–1.2)
Total Protein: 6.7 g/dL (ref 6.0–8.3)

## 2023-01-15 LAB — CBC
HCT: 40 % (ref 36.0–46.0)
Hemoglobin: 13.1 g/dL (ref 12.0–15.0)
MCHC: 32.7 g/dL (ref 30.0–36.0)
MCV: 87 fl (ref 78.0–100.0)
Platelets: 266 10*3/uL (ref 150.0–400.0)
RBC: 4.59 Mil/uL (ref 3.87–5.11)
RDW: 13.1 % (ref 11.5–15.5)
WBC: 4.9 10*3/uL (ref 4.0–10.5)

## 2023-01-15 LAB — VITAMIN D 25 HYDROXY (VIT D DEFICIENCY, FRACTURES): VITD: 29.18 ng/mL — ABNORMAL LOW (ref 30.00–100.00)

## 2023-01-15 NOTE — Progress Notes (Signed)
Established Patient Office Visit   Subjective:  Patient ID: Breanna Sharp, female    DOB: Jan 23, 1951  Age: 72 y.o. MRN: [id number]  Chief Complaint  Patient presents with   Medical Management of Chronic Issues    6 month follow up, no concerns. Patient fasting.     HPI Encounter Diagnoses  Name Primary?   Prediabetes Yes   Essential hypertension    Hyperlipidemia, unspecified hyperlipidemia type    Vitamin D deficiency    For follow-up of the above.  Continues follow-up with hyperlipidemia clinic.  LDL and LPA continue to be elevated despite therapy with Leqvio and Zetia.  [facility]  Blood pressure controlled with Zestoretic.  Prediabetes controlled with diet.   Review of Systems  Constitutional: Negative.   HENT: Negative.    Eyes:  Negative for blurred vision, discharge and redness.  Respiratory: Negative.    Cardiovascular: Negative.   Gastrointestinal:  Negative for abdominal pain.  Genitourinary: Negative.   Musculoskeletal: Negative.  Negative for myalgias.  Skin:  Negative for rash.  Neurological:  Negative for tingling, loss of consciousness and weakness.  Endo/Heme/Allergies:  Negative for polydipsia.     Current Outpatient Medications:    acetaminophen (TYLENOL) 650 MG CR tablet, Take 1,300 mg by mouth every 8 (eight) hours as needed for pain., Disp: , Rfl:    Calcium Carb-Cholecalciferol (CALCIUM + D3 PO), Take 2 tablets by mouth daily. Calcium 1200 mg and Vitamin D3 1000 IU, Disp: , Rfl:    Carboxymethylcellul-Glycerin (LUBRICATING EYE DROPS OP), Place 2 drops into both eyes daily. IVIZIA, Disp: , Rfl:    esomeprazole (NEXIUM) 40 MG capsule, TAKE 1 CAPSULE DAILY BEFORE BREAKFAST, Disp: 30 capsule, Rfl: 0   ezetimibe (ZETIA) 10 MG tablet, TAKE 1 TABLET DAILY, Disp: 90 tablet, Rfl: 3   fexofenadine (ALLEGRA) 180 MG tablet, Take 180 mg by mouth daily. , Disp: , Rfl:    FIBER PO, Take 2 capsules by mouth daily at 6 (six) AM. Fiber  Choice daily prebiotic fiber supplement, Disp: , Rfl:    fluticasone (FLONASE) 50 MCG/ACT nasal spray, Place 2 sprays into both nostrils daily., Disp: 48 g, Rfl: 6   inclisiran (LEQVIO) 284 MG/1.5ML SOSY injection, Inject 284 mg into the skin every 6 (six) months., Disp: , Rfl:    ketorolac (ACULAR) 0.5 % ophthalmic solution, Place 1 drop into the left eye 4 (four) times daily., Disp: , Rfl:    lisinopril-hydrochlorothiazide (ZESTORETIC) 20-12.5 MG tablet, TAKE 1 TABLET DAILY, Disp: 90 tablet, Rfl: 3   Multiple Vitamin (MULTIVITAMIN WITH MINERALS) TABS tablet, Take 1 tablet by mouth daily., Disp: , Rfl:    Na Sulfate-K Sulfate-Mg Sulf 17.5-3.13-1.6 GM/177ML SOLN, SMARTSIG:1 Kit(s) By Mouth Once, Disp: , Rfl:    Netarsudil-Latanoprost (ROCKLATAN) 0.02-0.005 % SOLN, Apply 1 drop to eye at bedtime., Disp: , Rfl:    Tiotropium Bromide Monohydrate (SPIRIVA RESPIMAT) 2.5 MCG/ACT AERS, Take 2 puffs by mouth daily., Disp: 12 g, Rfl: 6   Objective:     BP 130/76 (BP Location: Left Arm, Patient Position: Sitting, Cuff Size: Normal)   Pulse 100   Temp 98.2 F (36.8 C) (Temporal)   Ht 5' 3"$  (1.6 m)   Wt 134 lb 6.4 oz (61 kg)   SpO2 96%   BMI 23.81 kg/m  BP Readings from Last 3 Encounters:  01/15/23 130/76  10/20/22 (!) 148/73  09/19/22 124/65   Wt Readings from Last 3 Encounters:  01/15/23 134 lb  6.4 oz (61 kg)  09/19/22 133 lb (60.3 kg)  09/01/22 133 lb (60.3 kg)      Physical Exam Constitutional:      General: She is not in acute distress.    Appearance: Normal appearance. She is not ill-appearing, toxic-appearing or diaphoretic.  HENT:     Head: Normocephalic and atraumatic.     Right Ear: External ear normal.     Left Ear: External ear normal.  Eyes:     General: No scleral icterus.       Right eye: No discharge.        Left eye: No discharge.     Extraocular Movements: Extraocular movements intact.     Conjunctiva/sclera: Conjunctivae normal.  Pulmonary:     Effort:  Pulmonary effort is normal. No respiratory distress.  Skin:    General: Skin is warm and dry.  Neurological:     Mental Status: She is alert and oriented to person, place, and time.  Psychiatric:        Mood and Affect: Mood normal.        Behavior: Behavior normal.      No results found for any visits on 01/15/23.    The 10-year ASCVD risk score (Arnett DK, et al., 2019) is: 14.6%    Assessment & Plan:   Prediabetes -     Comprehensive metabolic panel -     Urinalysis, Routine w reflex microscopic  Essential hypertension -     CBC -     Comprehensive metabolic panel -     Urinalysis, Routine w reflex microscopic  Hyperlipidemia, unspecified hyperlipidemia type -     Lipid panel  Vitamin D deficiency -     VITAMIN D 25 Hydroxy (Vit-D Deficiency, Fractures)    Return in about 6 months (around 07/16/2023).  Continue current medications and follow-up with lipid clinic.  Other adjustments made pending results of today's labs.  Libby Maw, MD

## 2023-01-15 NOTE — Addendum Note (Signed)
Addended by: Lynda Rainwater on: 01/15/2023 10:07 AM   Modules accepted: Orders

## 2023-01-15 NOTE — Addendum Note (Signed)
Addended by: Lynda Rainwater on: 01/15/2023 10:08 AM   Modules accepted: Orders

## 2023-02-08 DIAGNOSIS — M5386 Other specified dorsopathies, lumbar region: Secondary | ICD-10-CM | POA: Diagnosis not present

## 2023-02-08 DIAGNOSIS — M9901 Segmental and somatic dysfunction of cervical region: Secondary | ICD-10-CM | POA: Diagnosis not present

## 2023-02-08 DIAGNOSIS — M5134 Other intervertebral disc degeneration, thoracic region: Secondary | ICD-10-CM | POA: Diagnosis not present

## 2023-02-08 DIAGNOSIS — M5032 Other cervical disc degeneration, mid-cervical region, unspecified level: Secondary | ICD-10-CM | POA: Diagnosis not present

## 2023-02-08 DIAGNOSIS — M9903 Segmental and somatic dysfunction of lumbar region: Secondary | ICD-10-CM | POA: Diagnosis not present

## 2023-02-08 DIAGNOSIS — M9902 Segmental and somatic dysfunction of thoracic region: Secondary | ICD-10-CM | POA: Diagnosis not present

## 2023-02-09 DIAGNOSIS — M5386 Other specified dorsopathies, lumbar region: Secondary | ICD-10-CM | POA: Diagnosis not present

## 2023-02-09 DIAGNOSIS — M9903 Segmental and somatic dysfunction of lumbar region: Secondary | ICD-10-CM | POA: Diagnosis not present

## 2023-02-09 DIAGNOSIS — M9901 Segmental and somatic dysfunction of cervical region: Secondary | ICD-10-CM | POA: Diagnosis not present

## 2023-02-09 DIAGNOSIS — M5134 Other intervertebral disc degeneration, thoracic region: Secondary | ICD-10-CM | POA: Diagnosis not present

## 2023-02-09 DIAGNOSIS — M5032 Other cervical disc degeneration, mid-cervical region, unspecified level: Secondary | ICD-10-CM | POA: Diagnosis not present

## 2023-02-09 DIAGNOSIS — M9902 Segmental and somatic dysfunction of thoracic region: Secondary | ICD-10-CM | POA: Diagnosis not present

## 2023-02-13 DIAGNOSIS — M5386 Other specified dorsopathies, lumbar region: Secondary | ICD-10-CM | POA: Diagnosis not present

## 2023-02-13 DIAGNOSIS — M9901 Segmental and somatic dysfunction of cervical region: Secondary | ICD-10-CM | POA: Diagnosis not present

## 2023-02-13 DIAGNOSIS — M9902 Segmental and somatic dysfunction of thoracic region: Secondary | ICD-10-CM | POA: Diagnosis not present

## 2023-02-13 DIAGNOSIS — M5134 Other intervertebral disc degeneration, thoracic region: Secondary | ICD-10-CM | POA: Diagnosis not present

## 2023-02-13 DIAGNOSIS — M5032 Other cervical disc degeneration, mid-cervical region, unspecified level: Secondary | ICD-10-CM | POA: Diagnosis not present

## 2023-02-13 DIAGNOSIS — M9903 Segmental and somatic dysfunction of lumbar region: Secondary | ICD-10-CM | POA: Diagnosis not present

## 2023-02-14 DIAGNOSIS — M9901 Segmental and somatic dysfunction of cervical region: Secondary | ICD-10-CM | POA: Diagnosis not present

## 2023-02-14 DIAGNOSIS — M5134 Other intervertebral disc degeneration, thoracic region: Secondary | ICD-10-CM | POA: Diagnosis not present

## 2023-02-14 DIAGNOSIS — M5032 Other cervical disc degeneration, mid-cervical region, unspecified level: Secondary | ICD-10-CM | POA: Diagnosis not present

## 2023-02-14 DIAGNOSIS — M9902 Segmental and somatic dysfunction of thoracic region: Secondary | ICD-10-CM | POA: Diagnosis not present

## 2023-02-14 DIAGNOSIS — M5386 Other specified dorsopathies, lumbar region: Secondary | ICD-10-CM | POA: Diagnosis not present

## 2023-02-14 DIAGNOSIS — M9903 Segmental and somatic dysfunction of lumbar region: Secondary | ICD-10-CM | POA: Diagnosis not present

## 2023-02-15 DIAGNOSIS — M5032 Other cervical disc degeneration, mid-cervical region, unspecified level: Secondary | ICD-10-CM | POA: Diagnosis not present

## 2023-02-15 DIAGNOSIS — M9902 Segmental and somatic dysfunction of thoracic region: Secondary | ICD-10-CM | POA: Diagnosis not present

## 2023-02-15 DIAGNOSIS — M5134 Other intervertebral disc degeneration, thoracic region: Secondary | ICD-10-CM | POA: Diagnosis not present

## 2023-02-15 DIAGNOSIS — M9903 Segmental and somatic dysfunction of lumbar region: Secondary | ICD-10-CM | POA: Diagnosis not present

## 2023-02-15 DIAGNOSIS — M9901 Segmental and somatic dysfunction of cervical region: Secondary | ICD-10-CM | POA: Diagnosis not present

## 2023-02-15 DIAGNOSIS — M5386 Other specified dorsopathies, lumbar region: Secondary | ICD-10-CM | POA: Diagnosis not present

## 2023-02-16 ENCOUNTER — Ambulatory Visit (INDEPENDENT_AMBULATORY_CARE_PROVIDER_SITE_OTHER): Payer: Medicare Other

## 2023-02-16 VITALS — Ht 63.5 in | Wt 134.0 lb

## 2023-02-16 DIAGNOSIS — Z0000 Encounter for general adult medical examination without abnormal findings: Secondary | ICD-10-CM

## 2023-02-16 NOTE — Patient Instructions (Signed)
Breanna Sharp , Thank you for taking time to come for your Medicare Wellness Visit. I appreciate your ongoing commitment to your health goals. Please review the following plan we discussed and let me know if I can assist you in the future.   These are the goals we discussed:  Goals      Patient Stated     To come off some medications     Patient Stated     02/16/2023, wants to get cholesterol under control        This is a list of the screening recommended for you and due dates:  Health Maintenance  Topic Date Due   Zoster (Shingles) Vaccine (1 of 2) 01/09/2024*   DTaP/Tdap/Td vaccine (2 - Td or Tdap) 08/21/2023   Mammogram  09/14/2023   Medicare Annual Wellness Visit  02/16/2024   Pneumonia Vaccine  Completed   Flu Shot  Completed   DEXA scan (bone density measurement)  Completed   Hepatitis C Screening: USPSTF Recommendation to screen - Ages 78-79 yo.  Completed   HPV Vaccine  Aged Out   Colon Cancer Screening  Discontinued   COVID-19 Vaccine  Discontinued  *Topic was postponed. The date shown is not the original due date.    Advanced directives: Advance directive discussed with you today.   Conditions/risks identified: none  Next appointment: Follow up in one year for your annual wellness visit    Preventive Care 65 Years and Older, Female Preventive care refers to lifestyle choices and visits with your health care provider that can promote health and wellness. What does preventive care include? A yearly physical exam. This is also called an annual well check. Dental exams once or twice a year. Routine eye exams. Ask your health care provider how often you should have your eyes checked. Personal lifestyle choices, including: Daily care of your teeth and gums. Regular physical activity. Eating a healthy diet. Avoiding tobacco and drug use. Limiting alcohol use. Practicing safe sex. Taking low-dose aspirin every day. Taking vitamin and mineral supplements as  recommended by your health care provider. What happens during an annual well check? The services and screenings done by your health care provider during your annual well check will depend on your age, overall health, lifestyle risk factors, and family history of disease. Counseling  Your health care provider may ask you questions about your: Alcohol use. Tobacco use. Drug use. Emotional well-being. Home and relationship well-being. Sexual activity. Eating habits. History of falls. Memory and ability to understand (cognition). Work and work Statistician. Reproductive health. Screening  You may have the following tests or measurements: Height, weight, and BMI. Blood pressure. Lipid and cholesterol levels. These may be checked every 5 years, or more frequently if you are over 17 years old. Skin check. Lung cancer screening. You may have this screening every year starting at age 60 if you have a 30-pack-year history of smoking and currently smoke or have quit within the past 15 years. Fecal occult blood test (FOBT) of the stool. You may have this test every year starting at age 50. Flexible sigmoidoscopy or colonoscopy. You may have a sigmoidoscopy every 5 years or a colonoscopy every 10 years starting at age 43. Hepatitis C blood test. Hepatitis B blood test. Sexually transmitted disease (STD) testing. Diabetes screening. This is done by checking your blood sugar (glucose) after you have not eaten for a while (fasting). You may have this done every 1-3 years. Bone density scan. This is done to  screen for osteoporosis. You may have this done starting at age 71. Mammogram. This may be done every 1-2 years. Talk to your health care provider about how often you should have regular mammograms. Talk with your health care provider about your test results, treatment options, and if necessary, the need for more tests. Vaccines  Your health care provider may recommend certain vaccines, such  as: Influenza vaccine. This is recommended every year. Tetanus, diphtheria, and acellular pertussis (Tdap, Td) vaccine. You may need a Td booster every 10 years. Zoster vaccine. You may need this after age 59. Pneumococcal 13-valent conjugate (PCV13) vaccine. One dose is recommended after age 38. Pneumococcal polysaccharide (PPSV23) vaccine. One dose is recommended after age 68. Talk to your health care provider about which screenings and vaccines you need and how often you need them. This information is not intended to replace advice given to you by your health care provider. Make sure you discuss any questions you have with your health care provider. Document Released: 12/10/2015 Document Revised: 08/02/2016 Document Reviewed: 09/14/2015 Elsevier Interactive Patient Education  2017 [place] Prevention in the Home Falls can cause injuries. They can happen to people of all ages. There are many things you can do to make your home safe and to help prevent falls. What can I do on the outside of my home? Regularly fix the edges of walkways and driveways and fix any cracks. Remove anything that might make you trip as you walk through a door, such as a raised step or threshold. Trim any bushes or trees on the path to your home. Use bright outdoor lighting. Clear any walking paths of anything that might make someone trip, such as rocks or tools. Regularly check to see if handrails are loose or broken. Make sure that both sides of any steps have handrails. Any raised decks and porches should have guardrails on the edges. Have any leaves, snow, or ice cleared regularly. Use sand or salt on walking paths during winter. Clean up any spills in your garage right away. This includes oil or grease spills. What can I do in the bathroom? Use night lights. Install grab bars by the toilet and in the tub and shower. Do not use towel bars as grab bars. Use non-skid mats or decals in the tub or  shower. If you need to sit down in the shower, use a plastic, non-slip stool. Keep the floor dry. Clean up any water that spills on the floor as soon as it happens. Remove soap buildup in the tub or shower regularly. Attach bath mats securely with double-sided non-slip rug tape. Do not have throw rugs and other things on the floor that can make you trip. What can I do in the bedroom? Use night lights. Make sure that you have a light by your bed that is easy to reach. Do not use any sheets or blankets that are too big for your bed. They should not hang down onto the floor. Have a firm chair that has side arms. You can use this for support while you get dressed. Do not have throw rugs and other things on the floor that can make you trip. What can I do in the kitchen? Clean up any spills right away. Avoid walking on wet floors. Keep items that you use a lot in easy-to-reach places. If you need to reach something above you, use a strong step stool that has a grab bar. Keep electrical cords out of the way.  Do not use floor polish or wax that makes floors slippery. If you must use wax, use non-skid floor wax. Do not have throw rugs and other things on the floor that can make you trip. What can I do with my stairs? Do not leave any items on the stairs. Make sure that there are handrails on both sides of the stairs and use them. Fix handrails that are broken or loose. Make sure that handrails are as long as the stairways. Check any carpeting to make sure that it is firmly attached to the stairs. Fix any carpet that is loose or worn. Avoid having throw rugs at the top or bottom of the stairs. If you do have throw rugs, attach them to the floor with carpet tape. Make sure that you have a light switch at the top of the stairs and the bottom of the stairs. If you do not have them, ask someone to add them for you. What else can I do to help prevent falls? Wear shoes that: Do not have high heels. Have  rubber bottoms. Are comfortable and fit you well. Are closed at the toe. Do not wear sandals. If you use a stepladder: Make sure that it is fully opened. Do not climb a closed stepladder. Make sure that both sides of the stepladder are locked into place. Ask someone to hold it for you, if possible. Clearly mark and make sure that you can see: Any grab bars or handrails. First and last steps. Where the edge of each step is. Use tools that help you move around (mobility aids) if they are needed. These include: Canes. Walkers. Scooters. Crutches. Turn on the lights when you go into a dark area. Replace any light bulbs as soon as they burn out. Set up your furniture so you have a clear path. Avoid moving your furniture around. If any of your floors are uneven, fix them. If there are any pets around you, be aware of where they are. Review your medicines with your doctor. Some medicines can make you feel dizzy. This can increase your chance of falling. Ask your doctor what other things that you can do to help prevent falls. This information is not intended to replace advice given to you by your health care provider. Make sure you discuss any questions you have with your health care provider. Document Released: 09/09/2009 Document Revised: 04/20/2016 Document Reviewed: 12/18/2014 Elsevier Interactive Patient Education  2017 [employer].

## 2023-02-16 NOTE — Progress Notes (Signed)
I connected with  Breanna Sharp on 02/16/23 by a audio enabled telemedicine application and verified that I am speaking with the correct person using two identifiers.  Patient Location: Home  Provider Location: Office/Clinic  I discussed the limitations of evaluation and management by telemedicine. The patient expressed understanding and agreed to proceed.  Subjective:   Breanna Sharp is a 72 y.o. female who presents for Medicare Annual (Subsequent) preventive examination.  Review of Systems     Cardiac Risk Factors include: advanced age (>64men, >22 women);dyslipidemia;hypertension     Objective:    Today's Vitals   02/16/23 0855  Weight: 134 lb (60.8 kg)  Height: 5' 3.5" (1.613 m)   Body mass index is 23.36 kg/m.     02/16/2023    9:00 AM 02/15/2022   10:51 AM 11/16/2020    9:09 AM 11/24/2019    7:29 AM 11/13/2019   11:12 AM 03/11/2019   10:24 AM 09/10/2018    8:58 AM  Advanced Directives  Does Patient Have a Medical Advance Directive? No No No No Yes No No  Would patient like information on creating a medical advance directive?  No - Patient declined No - Patient declined No - Patient declined  No - Patient declined Yes (MAU/Ambulatory/Procedural Areas - Information given)    Current Medications (verified) Outpatient Encounter Medications as of 02/16/2023  Medication Sig   acetaminophen (TYLENOL) 650 MG CR tablet Take 1,300 mg by mouth every 8 (eight) hours as needed for pain.   Calcium Carb-Cholecalciferol (CALCIUM + D3 PO) Take 2 tablets by mouth daily. Calcium 1200 mg and Vitamin D3 1000 IU   Carboxymethylcellul-Glycerin (LUBRICATING EYE DROPS OP) Place 2 drops into both eyes daily. IVIZIA   esomeprazole (NEXIUM) 40 MG capsule TAKE 1 CAPSULE DAILY BEFORE BREAKFAST   ezetimibe (ZETIA) 10 MG tablet TAKE 1 TABLET DAILY   fexofenadine (ALLEGRA) 180 MG tablet Take 180 mg by mouth daily.    FIBER PO Take 2 capsules by mouth daily at 6 (six) AM. Fiber Choice  daily prebiotic fiber supplement   fluticasone (FLONASE) 50 MCG/ACT nasal spray Place 2 sprays into both nostrils daily.   inclisiran (LEQVIO) 284 MG/1.5ML SOSY injection Inject 284 mg into the skin every 6 (six) months.   lisinopril-hydrochlorothiazide (ZESTORETIC) 20-12.5 MG tablet TAKE 1 TABLET DAILY   Multiple Vitamin (MULTIVITAMIN WITH MINERALS) TABS tablet Take 1 tablet by mouth daily.   Netarsudil-Latanoprost (ROCKLATAN) 0.02-0.005 % SOLN Apply 1 drop to eye at bedtime.   Tiotropium Bromide Monohydrate (SPIRIVA RESPIMAT) 2.5 MCG/ACT AERS Take 2 puffs by mouth daily.   No facility-administered encounter medications on file as of 02/16/2023.    Allergies (verified) Statins   History: Past Medical History:  Diagnosis Date   Anxiety    Arthritis    Cataract    bilaterqal sx   Dyslipidemia    statin intolerance   GERD (gastroesophageal reflux disease)    on meds   Glaucoma    bilateral sx   Heart murmur    Hyperlipidemia    on meds   Hypertension    on meds   Seasonal allergies    Uterine cancer ([place]) 1972   hysterectomy-cervical cancer   Vertigo    Past Surgical History:  Procedure Laterality Date   21 HOUR [place] STUDY N/A 09/29/2019   Procedure: 24 HOUR PH STUDY;  Surgeon: Mauri Pole, MD;  Location: [facility];  Service: Endoscopy;  Laterality: N/A;   ABDOMINAL HYSTERECTOMY  1972  cervical cancer   BIOPSY  11/24/2019   Procedure: BIOPSY;  Surgeon: Mauri Pole, MD;  Location: [facility];  Service: Endoscopy;;   BRAVO [place] STUDY N/A 11/24/2019   Procedure: BRAVO [place];  Surgeon: Mauri Pole, MD;  Location: [facility];  Service: Endoscopy;  Laterality: N/A;   CATARACT EXTRACTION, BILATERAL Bilateral 03/2022   COLONOSCOPY  08/2017   KN-MAC-suprep(good)-tics/TA x 3   ESOPHAGEAL MANOMETRY N/A 09/29/2019   Procedure: ESOPHAGEAL MANOMETRY (EM);  Surgeon: Mauri Pole, MD;  Location: [facility];  Service: Endoscopy;  Laterality: N/A;    ESOPHAGOGASTRODUODENOSCOPY (EGD) WITH PROPOFOL N/A 11/24/2019   Procedure: ESOPHAGOGASTRODUODENOSCOPY (EGD) WITH PROPOFOL;  Surgeon: Mauri Pole, MD;  [facility];  Service: Endoscopy;  Laterality: N/A;   GLAUCOMA SURGERY Bilateral 03/2022   POLYPECTOMY  2018   TA x 3   WISDOM TOOTH EXTRACTION     uppers   Family History  Problem Relation Age of Onset   Hypertension Mother        dx in her 25's   Heart attack Mother 71   Breast cancer Mother        unsure of age   Colon polyps Sister 88   Heart attack Brother 38   Diabetes Brother    Hyperlipidemia Brother    Hypertension Brother    Heart disease Brother    Colon cancer Neg Hx    Esophageal cancer Neg Hx    Rectal cancer Neg Hx    Stomach cancer Neg Hx    Social History   Socioeconomic History   Marital status: Married    Spouse name: Breanna Sharp   Number of children: 0   Years of education: 12   Highest education level: Not on file  Occupational History    Employer: RETIRED    Comment: Retired  Tobacco Use   Smoking status: Former    Packs/day: 0.50    Years: 32.00    Additional pack years: 0.00    Total pack years: 16.00    Types: Cigarettes    Quit date: 11/27/2002    Years since quitting: 20.2   Smokeless tobacco: Never  Vaping Use   Vaping Use: Never used  Substance and Sexual Activity   Alcohol use: Not Currently    Alcohol/week: 0.0 - 2.0 standard drinks of alcohol    Comment: social - 2 or 3 times a month 1-2 glasses of wine   Drug use: No   Sexual activity: Yes    Partners: Male  Other Topics Concern   Not on file  Social History Narrative   Exercise 3 to 4 times/week walking for 45 min -1 hour   Patient lives at home with her husband Breanna Sharp).   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   Right handed.   decaffeine Green Tea.   Social Determinants of Health   Financial Resource Strain:  Low Risk  (02/16/2023)   Overall Financial Resource Strain (CARDIA)    Difficulty of Paying Living Expenses: Not hard at all  Food Insecurity: No Food Insecurity (02/16/2023)   Hunger Vital Sign    Worried About Running Out of Food in the Last Year: Never true    Ran Out of Food in the Last Year: Never true  Transportation Needs: No Transportation Needs (02/16/2023)   PRAPARE - Transportation    Lack of Transportation (Medical): No    Lack  of Transportation (Non-Medical): No  Physical Activity: Sufficiently Active (02/16/2023)   Exercise Vital Sign    Days of Exercise per Week: 6 days    Minutes of Exercise per Session: 90 min  Stress: No Stress Concern Present (02/16/2023)   [place]    Feeling of Stress : Only a little  Social Connections: Moderately Integrated (02/15/2022)   Social Connection and Isolation Panel [NHANES]    Frequency of Communication with Friends and Family: Twice a week    Frequency of Social Gatherings with Friends and Family: Twice a week    Attends Religious Services: More than 4 times per year    Active Member of [employer] or Organizations: No    Attends Music therapist: Never    Marital Status: Married    Tobacco Counseling Counseling given: Not Answered   Clinical Intake:  Pre-visit preparation completed: Yes  Pain : No/denies pain     Nutritional Status: BMI of 19-24  Normal Nutritional Risks: None Diabetes: No  How often do you need to have someone help you when you read instructions, pamphlets, or other written materials from your doctor or pharmacy?: 1 - Never  Diabetic? no  Interpreter Needed?: No  Information entered by :: NAllen LPN   Activities of Daily Living    02/16/2023    9:01 AM  In your present state of health, do you have any difficulty performing the following activities:  Hearing? 0  Vision? 1  Comment gets blurry sometimes due to dry eyes   Difficulty concentrating or making decisions? 0  Walking or climbing stairs? 0  Dressing or bathing? 0  Doing errands, shopping? 0  Preparing Food and eating ? N  Using the Toilet? N  In the past six months, have you accidently leaked urine? N  Do you have problems with loss of bowel control? N  Managing your Medications? N  Managing your Finances? N  Housekeeping or managing your Housekeeping? N    Patient Care Team: Libby Maw, MD as PCP - General (Family Medicine) Debara Pickett Nadean Corwin, MD as PCP - Cardiology (Cardiology)  Indicate any recent Medical Services you may have received from other than [facility] providers in the past year (date may be approximate).     Assessment:   This is a routine wellness examination for [place].  Hearing/Vision screen Vision Screening - Comments:: Regular eye exams, [facility]  Dietary issues and exercise activities discussed: Current Exercise Habits: Home exercise routine, Type of exercise: walking, Time (Minutes): > 60, Frequency (Times/Week): 7, Weekly Exercise (Minutes/Week): 0   Goals Addressed             This Visit's Progress    Patient Stated       02/16/2023, wants to get cholesterol under control       Depression Screen    02/16/2023    9:01 AM 01/15/2023    8:09 AM 07/17/2022    8:10 AM 02/15/2022   10:52 AM 02/15/2022   10:49 AM 01/17/2022    8:01 AM 07/14/2021    9:36 AM  PHQ 2/9 Scores  PHQ - 2 Score 0 0 0 0 0 0 1  PHQ- 9 Score       3    Fall Risk    02/16/2023    9:01 AM 01/15/2023    8:09 AM 07/17/2022    8:10 AM 02/15/2022   10:52 AM 01/17/2022    8:02 AM  Fall Risk   Falls in the past year? 0 0 0 0 0  Number falls in past yr: 0 0 0 0 0  Injury with Fall? 0 0 0 0   Risk for fall due to : Medication side effect No Fall Risks     Follow up Falls prevention discussed;Education provided;Falls evaluation completed Falls evaluation completed  Falls evaluation completed     FALL RISK PREVENTION  PERTAINING TO THE HOME:  Any stairs in or around the home? Yes  If so, are there any without handrails? Yes  Home free of loose throw rugs in walkways, pet beds, electrical cords, etc? Yes  Adequate lighting in your home to reduce risk of falls? Yes   ASSISTIVE DEVICES UTILIZED TO PREVENT FALLS:  Life alert? No  Use of a cane, walker or w/c? No  Grab bars in the bathroom? No  Shower chair or bench in shower? Yes  Elevated toilet seat or a handicapped toilet? Yes   TIMED UP AND GO:  Was the test performed? No .      Cognitive Function:        02/16/2023    9:05 AM 11/16/2020    9:04 AM 11/13/2019   11:20 AM 09/10/2018    8:56 AM  6CIT Screen  What Year? 0 points 0 points 0 points 0 points  What month? 0 points 0 points 0 points 0 points  What time? 0 points 0 points 0 points 0 points  Count back from 20 0 points 0 points 0 points 0 points  Months in reverse 0 points 2 points 0 points 0 points  Repeat phrase 0 points 0 points 0 points 2 points  Total Score 0 points 2 points 0 points 2 points    Immunizations Immunization History  Administered Date(s) Administered   Fluad Quad(high Dose 65+) 07/23/2019, 08/23/2020, 08/17/2021, 08/03/2022   Influenza Split 08/14/2012   Influenza,inj,Quad PF,6+ Mos 08/20/2013, 08/26/2014, 09/01/2015, 08/31/2016, 09/04/2017   Influenza-Unspecified 08/08/2018   PFIZER(Purple Top)SARS-COV-2 Vaccination 01/01/2020, 01/22/2020, 09/21/2020   Pneumococcal Conjugate-13 09/01/2015   Pneumococcal Polysaccharide-23 09/07/2016   Tdap 08/20/2013   Zoster, Live 09/28/2011    TDAP status: Up to date  Flu Vaccine status: Up to date  Pneumococcal vaccine status: Up to date  Covid-19 vaccine status: Completed vaccines  Qualifies for Shingles Vaccine? Yes   Zostavax completed Yes   Shingrix Completed?: No.    Education has been provided regarding the importance of this vaccine. Patient has been advised to call insurance company to determine  out of pocket expense if they have not yet received this vaccine. Advised may also receive vaccine at local pharmacy or Health Dept. Verbalized acceptance and understanding.  Screening Tests Health Maintenance  Topic Date Due   Medicare Annual Wellness (AWV)  02/16/2023   Zoster Vaccines- Shingrix (1 of 2) 01/09/2024 (Originally 03/22/1970)   DTaP/Tdap/Td (2 - Td or Tdap) 08/21/2023   MAMMOGRAM  09/14/2023   Pneumonia Vaccine 80+ Years old  Completed   INFLUENZA VACCINE  Completed   DEXA SCAN  Completed   Hepatitis C Screening  Completed   HPV VACCINES  Aged Out   COLONOSCOPY (Pts 45-50yrs Insurance coverage will need to be confirmed)  Discontinued   COVID-19 Vaccine  Discontinued    Health Maintenance  Health Maintenance Due  Topic Date Due   Medicare Annual Wellness (AWV)  02/16/2023    Colorectal cancer screening: No longer required.   Mammogram status: Completed 09/13/2022. Repeat every year  Bone  Density status: Completed 06/27/2022.   Lung Cancer Screening: (Low Dose CT Chest recommended if Age 107-80 years, 30 pack-year currently smoking OR have quit w/in 15years.) does not qualify.   Lung Cancer Screening Referral: no  Additional Screening:  Hepatitis C Screening: does qualify; Completed 08/26/2014  Vision Screening: Recommended annual ophthalmology exams for early detection of glaucoma and other disorders of the eye. Is the patient up to date with their annual eye exam?  Yes  Who is the provider or what is the name of the office in which the patient attends annual eye exams? [facility] If pt is not established with a provider, would they like to be referred to a provider to establish care? No .   Dental Screening: Recommended annual dental exams for proper oral hygiene  Community Resource Referral / Chronic Care Management: CRR required this visit?  No   CCM required this visit?  No      Plan:     I have personally reviewed and noted the following in  the patient's chart:   Medical and social history Use of alcohol, tobacco or illicit drugs  Current medications and supplements including opioid prescriptions. Patient is not currently taking opioid prescriptions. Functional ability and status Nutritional status Physical activity Advanced directives List of other physicians Hospitalizations, surgeries, and ER visits in previous 12 months Vitals Screenings to include cognitive, depression, and falls Referrals and appointments  In addition, I have reviewed and discussed with patient certain preventive protocols, quality metrics, and best practice recommendations. A written personalized care plan for preventive services as well as general preventive health recommendations were provided to patient.     Kellie Simmering, LPN   [id number]   Nurse Notes: none  Due to this being a virtual visit, the after visit summary with patients personalized plan was offered to patient via mail or my-chart. Patient would like to access on my-chart

## 2023-02-23 DIAGNOSIS — M9901 Segmental and somatic dysfunction of cervical region: Secondary | ICD-10-CM | POA: Diagnosis not present

## 2023-02-23 DIAGNOSIS — M9903 Segmental and somatic dysfunction of lumbar region: Secondary | ICD-10-CM | POA: Diagnosis not present

## 2023-02-23 DIAGNOSIS — M5386 Other specified dorsopathies, lumbar region: Secondary | ICD-10-CM | POA: Diagnosis not present

## 2023-02-23 DIAGNOSIS — M5032 Other cervical disc degeneration, mid-cervical region, unspecified level: Secondary | ICD-10-CM | POA: Diagnosis not present

## 2023-02-23 DIAGNOSIS — M9902 Segmental and somatic dysfunction of thoracic region: Secondary | ICD-10-CM | POA: Diagnosis not present

## 2023-02-23 DIAGNOSIS — M5134 Other intervertebral disc degeneration, thoracic region: Secondary | ICD-10-CM | POA: Diagnosis not present

## 2023-02-27 ENCOUNTER — Other Ambulatory Visit: Payer: Self-pay | Admitting: Gastroenterology

## 2023-02-27 DIAGNOSIS — Z961 Presence of intraocular lens: Secondary | ICD-10-CM | POA: Diagnosis not present

## 2023-02-27 DIAGNOSIS — H401131 Primary open-angle glaucoma, bilateral, mild stage: Secondary | ICD-10-CM | POA: Diagnosis not present

## 2023-02-27 DIAGNOSIS — H04123 Dry eye syndrome of bilateral lacrimal glands: Secondary | ICD-10-CM | POA: Diagnosis not present

## 2023-02-27 DIAGNOSIS — H43393 Other vitreous opacities, bilateral: Secondary | ICD-10-CM | POA: Diagnosis not present

## 2023-02-28 DIAGNOSIS — M5032 Other cervical disc degeneration, mid-cervical region, unspecified level: Secondary | ICD-10-CM | POA: Diagnosis not present

## 2023-02-28 DIAGNOSIS — M5386 Other specified dorsopathies, lumbar region: Secondary | ICD-10-CM | POA: Diagnosis not present

## 2023-02-28 DIAGNOSIS — M9903 Segmental and somatic dysfunction of lumbar region: Secondary | ICD-10-CM | POA: Diagnosis not present

## 2023-02-28 DIAGNOSIS — M5134 Other intervertebral disc degeneration, thoracic region: Secondary | ICD-10-CM | POA: Diagnosis not present

## 2023-02-28 DIAGNOSIS — M9901 Segmental and somatic dysfunction of cervical region: Secondary | ICD-10-CM | POA: Diagnosis not present

## 2023-02-28 DIAGNOSIS — M9902 Segmental and somatic dysfunction of thoracic region: Secondary | ICD-10-CM | POA: Diagnosis not present

## 2023-03-01 DIAGNOSIS — M9902 Segmental and somatic dysfunction of thoracic region: Secondary | ICD-10-CM | POA: Diagnosis not present

## 2023-03-01 DIAGNOSIS — M9903 Segmental and somatic dysfunction of lumbar region: Secondary | ICD-10-CM | POA: Diagnosis not present

## 2023-03-01 DIAGNOSIS — M5032 Other cervical disc degeneration, mid-cervical region, unspecified level: Secondary | ICD-10-CM | POA: Diagnosis not present

## 2023-03-01 DIAGNOSIS — M5134 Other intervertebral disc degeneration, thoracic region: Secondary | ICD-10-CM | POA: Diagnosis not present

## 2023-03-01 DIAGNOSIS — M5386 Other specified dorsopathies, lumbar region: Secondary | ICD-10-CM | POA: Diagnosis not present

## 2023-03-01 DIAGNOSIS — M9901 Segmental and somatic dysfunction of cervical region: Secondary | ICD-10-CM | POA: Diagnosis not present

## 2023-03-07 DIAGNOSIS — M5386 Other specified dorsopathies, lumbar region: Secondary | ICD-10-CM | POA: Diagnosis not present

## 2023-03-07 DIAGNOSIS — M5134 Other intervertebral disc degeneration, thoracic region: Secondary | ICD-10-CM | POA: Diagnosis not present

## 2023-03-07 DIAGNOSIS — M9901 Segmental and somatic dysfunction of cervical region: Secondary | ICD-10-CM | POA: Diagnosis not present

## 2023-03-07 DIAGNOSIS — M5032 Other cervical disc degeneration, mid-cervical region, unspecified level: Secondary | ICD-10-CM | POA: Diagnosis not present

## 2023-03-07 DIAGNOSIS — M9902 Segmental and somatic dysfunction of thoracic region: Secondary | ICD-10-CM | POA: Diagnosis not present

## 2023-03-07 DIAGNOSIS — M9903 Segmental and somatic dysfunction of lumbar region: Secondary | ICD-10-CM | POA: Diagnosis not present

## 2023-03-08 DIAGNOSIS — M9902 Segmental and somatic dysfunction of thoracic region: Secondary | ICD-10-CM | POA: Diagnosis not present

## 2023-03-08 DIAGNOSIS — M5386 Other specified dorsopathies, lumbar region: Secondary | ICD-10-CM | POA: Diagnosis not present

## 2023-03-08 DIAGNOSIS — M5032 Other cervical disc degeneration, mid-cervical region, unspecified level: Secondary | ICD-10-CM | POA: Diagnosis not present

## 2023-03-08 DIAGNOSIS — M5134 Other intervertebral disc degeneration, thoracic region: Secondary | ICD-10-CM | POA: Diagnosis not present

## 2023-03-08 DIAGNOSIS — M9903 Segmental and somatic dysfunction of lumbar region: Secondary | ICD-10-CM | POA: Diagnosis not present

## 2023-03-08 DIAGNOSIS — M9901 Segmental and somatic dysfunction of cervical region: Secondary | ICD-10-CM | POA: Diagnosis not present

## 2023-03-13 DIAGNOSIS — M5032 Other cervical disc degeneration, mid-cervical region, unspecified level: Secondary | ICD-10-CM | POA: Diagnosis not present

## 2023-03-13 DIAGNOSIS — M5134 Other intervertebral disc degeneration, thoracic region: Secondary | ICD-10-CM | POA: Diagnosis not present

## 2023-03-13 DIAGNOSIS — M9902 Segmental and somatic dysfunction of thoracic region: Secondary | ICD-10-CM | POA: Diagnosis not present

## 2023-03-13 DIAGNOSIS — M9903 Segmental and somatic dysfunction of lumbar region: Secondary | ICD-10-CM | POA: Diagnosis not present

## 2023-03-13 DIAGNOSIS — M5386 Other specified dorsopathies, lumbar region: Secondary | ICD-10-CM | POA: Diagnosis not present

## 2023-03-13 DIAGNOSIS — M9901 Segmental and somatic dysfunction of cervical region: Secondary | ICD-10-CM | POA: Diagnosis not present

## 2023-03-14 DIAGNOSIS — M9901 Segmental and somatic dysfunction of cervical region: Secondary | ICD-10-CM | POA: Diagnosis not present

## 2023-03-14 DIAGNOSIS — M5134 Other intervertebral disc degeneration, thoracic region: Secondary | ICD-10-CM | POA: Diagnosis not present

## 2023-03-14 DIAGNOSIS — M9903 Segmental and somatic dysfunction of lumbar region: Secondary | ICD-10-CM | POA: Diagnosis not present

## 2023-03-14 DIAGNOSIS — M5032 Other cervical disc degeneration, mid-cervical region, unspecified level: Secondary | ICD-10-CM | POA: Diagnosis not present

## 2023-03-14 DIAGNOSIS — M5386 Other specified dorsopathies, lumbar region: Secondary | ICD-10-CM | POA: Diagnosis not present

## 2023-03-14 DIAGNOSIS — M9902 Segmental and somatic dysfunction of thoracic region: Secondary | ICD-10-CM | POA: Diagnosis not present

## 2023-04-20 ENCOUNTER — Other Ambulatory Visit (HOSPITAL_COMMUNITY): Payer: Self-pay | Admitting: *Deleted

## 2023-04-20 ENCOUNTER — Encounter (HOSPITAL_COMMUNITY): Payer: Medicare Other

## 2023-04-25 ENCOUNTER — Ambulatory Visit (HOSPITAL_COMMUNITY)
Admission: RE | Admit: 2023-04-25 | Discharge: 2023-04-25 | Disposition: A | Discharge status: Home / Self Care | Payer: Medicare Other | Source: Ambulatory Visit | Attending: Internal Medicine | Admitting: Internal Medicine

## 2023-04-25 DIAGNOSIS — M791 Myalgia, unspecified site: Secondary | ICD-10-CM | POA: Insufficient documentation

## 2023-04-25 DIAGNOSIS — E785 Hyperlipidemia, unspecified: Secondary | ICD-10-CM | POA: Insufficient documentation

## 2023-04-25 DIAGNOSIS — I2510 Atherosclerotic heart disease of native coronary artery without angina pectoris: Secondary | ICD-10-CM | POA: Diagnosis not present

## 2023-04-25 MED ORDER — INCLISIRAN SODIUM 284 MG/1.5ML ~~LOC~~ SOSY
284.0000 mg | PREFILLED_SYRINGE | Freq: Once | SUBCUTANEOUS | Status: AC
  Administered 2023-04-25: 284 mg via SUBCUTANEOUS

## 2023-04-25 MED ORDER — INCLISIRAN SODIUM 284 MG/1.5ML ~~LOC~~ SOSY
PREFILLED_SYRINGE | SUBCUTANEOUS | Status: AC
  Filled 2023-04-25: qty 1.5

## 2023-05-10 DIAGNOSIS — M5134 Other intervertebral disc degeneration, thoracic region: Secondary | ICD-10-CM | POA: Diagnosis not present

## 2023-05-10 DIAGNOSIS — M9901 Segmental and somatic dysfunction of cervical region: Secondary | ICD-10-CM | POA: Diagnosis not present

## 2023-05-10 DIAGNOSIS — M5386 Other specified dorsopathies, lumbar region: Secondary | ICD-10-CM | POA: Diagnosis not present

## 2023-05-10 DIAGNOSIS — M5032 Other cervical disc degeneration, mid-cervical region, unspecified level: Secondary | ICD-10-CM | POA: Diagnosis not present

## 2023-05-10 DIAGNOSIS — M9902 Segmental and somatic dysfunction of thoracic region: Secondary | ICD-10-CM | POA: Diagnosis not present

## 2023-05-10 DIAGNOSIS — M9903 Segmental and somatic dysfunction of lumbar region: Secondary | ICD-10-CM | POA: Diagnosis not present

## 2023-05-15 ENCOUNTER — Other Ambulatory Visit: Payer: Self-pay | Admitting: Family Medicine

## 2023-05-15 DIAGNOSIS — H6123 Impacted cerumen, bilateral: Secondary | ICD-10-CM | POA: Diagnosis not present

## 2023-05-15 DIAGNOSIS — H61303 Acquired stenosis of external ear canal, unspecified, bilateral: Secondary | ICD-10-CM | POA: Diagnosis not present

## 2023-05-15 DIAGNOSIS — J449 Chronic obstructive pulmonary disease, unspecified: Secondary | ICD-10-CM

## 2023-05-15 DIAGNOSIS — H900 Conductive hearing loss, bilateral: Secondary | ICD-10-CM | POA: Diagnosis not present

## 2023-05-17 DIAGNOSIS — L299 Pruritus, unspecified: Secondary | ICD-10-CM | POA: Diagnosis not present

## 2023-05-17 DIAGNOSIS — H6991 Unspecified Eustachian tube disorder, right ear: Secondary | ICD-10-CM | POA: Diagnosis not present

## 2023-06-04 ENCOUNTER — Other Ambulatory Visit: Payer: Self-pay | Admitting: Family Medicine

## 2023-06-04 DIAGNOSIS — J301 Allergic rhinitis due to pollen: Secondary | ICD-10-CM

## 2023-06-13 DIAGNOSIS — Z8669 Personal history of other diseases of the nervous system and sense organs: Secondary | ICD-10-CM | POA: Diagnosis not present

## 2023-06-13 DIAGNOSIS — H9071 Mixed conductive and sensorineural hearing loss, unilateral, right ear, with unrestricted hearing on the contralateral side: Secondary | ICD-10-CM | POA: Diagnosis not present

## 2023-06-27 ENCOUNTER — Encounter (INDEPENDENT_AMBULATORY_CARE_PROVIDER_SITE_OTHER): Payer: Self-pay

## 2023-07-17 ENCOUNTER — Encounter: Payer: Self-pay | Admitting: Family Medicine

## 2023-07-17 ENCOUNTER — Ambulatory Visit (INDEPENDENT_AMBULATORY_CARE_PROVIDER_SITE_OTHER): Payer: Medicare Other | Admitting: Family Medicine

## 2023-07-17 VITALS — BP 142/78 | HR 104 | Temp 98.4°F | Ht 63.0 in | Wt 130.0 lb

## 2023-07-17 DIAGNOSIS — E785 Hyperlipidemia, unspecified: Secondary | ICD-10-CM

## 2023-07-17 DIAGNOSIS — R0982 Postnasal drip: Secondary | ICD-10-CM | POA: Diagnosis not present

## 2023-07-17 DIAGNOSIS — I10 Essential (primary) hypertension: Secondary | ICD-10-CM

## 2023-07-17 DIAGNOSIS — E559 Vitamin D deficiency, unspecified: Secondary | ICD-10-CM | POA: Diagnosis not present

## 2023-07-17 LAB — BASIC METABOLIC PANEL
BUN: 13 mg/dL (ref 6–23)
CO2: 27 mEq/L (ref 19–32)
Calcium: 10.2 mg/dL (ref 8.4–10.5)
Chloride: 103 mEq/L (ref 96–112)
Creatinine, Ser: 0.7 mg/dL (ref 0.40–1.20)
GFR: 85.86 mL/min (ref >60.00)
Glucose, Bld: 110 mg/dL — ABNORMAL HIGH (ref 70–99)
Potassium: 3.9 mEq/L (ref 3.5–5.1)
Sodium: 139 mEq/L (ref 135–145)

## 2023-07-17 LAB — VITAMIN D 25 HYDROXY (VIT D DEFICIENCY, FRACTURES): VITD: 41.79 ng/mL (ref 30.00–100.00)

## 2023-07-17 LAB — LDL CHOLESTEROL, DIRECT: Direct LDL: 101 mg/dL

## 2023-07-17 MED ORDER — FEXOFENADINE HCL 180 MG PO TABS: 180.0000 mg | ORAL_TABLET | Freq: Every day | ORAL | 2 refills | Status: DC

## 2023-07-17 NOTE — Progress Notes (Signed)
Established Patient Office Visit   Subjective:  Patient ID: Breanna Sharp, female    DOB: February 20, 1951  Age: 72 y.o. MRN: [id number]  Chief Complaint  Patient presents with   Medical Management of Chronic Issues    6 month follow up. Pt is fasting. Pt complains of post nasal drainage that was from Covid 3 weeks ago. Pt requesting Rx for Allegra to be sent to [facility].     HPI Encounter Diagnoses  Name Primary?   Essential hypertension Yes   Hyperlipidemia, unspecified hyperlipidemia type    PND (post-nasal drip)    Vitamin D deficiency    Follow-up of above.  I have removed her diagnosis of prediabetes.  She continues to exercise daily and A1c has been in the normal range.  Blood pressure at home with multiple checks runs between 100-120/60-70.  Ongoing postnasal drip status post COVID infection 3 weeks ago.  She continues to use Flonase daily needs a refill of Allegra.  She is taking 2000 IUs of vitamin D daily between the combination pill of calcium with vitamin D and a vitamin D supplement.   Review of Systems  Constitutional: Negative.   HENT: Negative.    Eyes:  Negative for blurred vision, discharge and redness.  Respiratory: Negative.    Cardiovascular: Negative.   Gastrointestinal:  Negative for abdominal pain.  Genitourinary: Negative.   Musculoskeletal: Negative.  Negative for myalgias.  Skin:  Negative for rash.  Neurological:  Negative for tingling, loss of consciousness and weakness.  Endo/Heme/Allergies:  Negative for polydipsia.     Current Outpatient Medications:    acetaminophen (TYLENOL) 650 MG CR tablet, Take 1,300 mg by mouth every 8 (eight) hours as needed for pain., Disp: , Rfl:    Calcium Carb-Cholecalciferol (CALCIUM + D3 PO), Take 2 tablets by mouth daily. Calcium 1200 mg and Vitamin D3 1000 IU, Disp: , Rfl:    Carboxymethylcellul-Glycerin (LUBRICATING EYE DROPS OP), Place 2 drops into both eyes daily. IVIZIA, Disp: , Rfl:    esomeprazole  (NEXIUM) 40 MG capsule, TAKE 1 CAPSULE DAILY BEFORE BREAKFAST, Disp: 90 capsule, Rfl: 3   ezetimibe (ZETIA) 10 MG tablet, TAKE 1 TABLET DAILY, Disp: 90 tablet, Rfl: 3   FIBER PO, Take 2 capsules by mouth daily at 6 (six) AM. Fiber Choice daily prebiotic fiber supplement, Disp: , Rfl:    fluticasone (FLONASE) 50 MCG/ACT nasal spray, USE 2 SPRAYS IN EACH NOSTRIL DAILY, Disp: 48 g, Rfl: 3   inclisiran (LEQVIO) 284 MG/1.5ML SOSY injection, Inject 284 mg into the skin every 6 (six) months., Disp: , Rfl:    lisinopril-hydrochlorothiazide (ZESTORETIC) 20-12.5 MG tablet, TAKE 1 TABLET DAILY, Disp: 90 tablet, Rfl: 3   Multiple Vitamin (MULTIVITAMIN WITH MINERALS) TABS tablet, Take 1 tablet by mouth daily., Disp: , Rfl:    Netarsudil-Latanoprost (ROCKLATAN) 0.02-0.005 % SOLN, Apply 1 drop to eye at bedtime., Disp: , Rfl:    SPIRIVA RESPIMAT 2.5 MCG/ACT AERS, USE 2 INHALATIONS DAILY, Disp: 12 g, Rfl: 3   fexofenadine (ALLEGRA) 180 MG tablet, Take 1 tablet (180 mg total) by mouth daily., Disp: 90 tablet, Rfl: 2   Objective:     BP (!) 142/78   Pulse (!) 104   Temp 98.4 F (36.9 C)   Ht 5\' 3"  (1.6 m)   Wt 130 lb (59 kg)   SpO2 97%   BMI 23.03 kg/m  BP Readings from Last 3 Encounters:  07/17/23 (!) 142/78  04/25/23 (!) 155/78  01/15/23 130/76  Wt Readings from Last 3 Encounters:  07/17/23 130 lb (59 kg)  02/16/23 134 lb (60.8 kg)  01/15/23 134 lb 6.4 oz (61 kg)      Physical Exam Constitutional:      General: She is not in acute distress.    Appearance: Normal appearance. She is not ill-appearing, toxic-appearing or diaphoretic.  HENT:     Head: Normocephalic and atraumatic.     Right Ear: External ear normal.     Left Ear: External ear normal.     Mouth/Throat:     Mouth: Mucous membranes are moist.     Pharynx: Oropharynx is clear. No oropharyngeal exudate or posterior oropharyngeal erythema.  Eyes:     General: No scleral icterus.       Right eye: No discharge.        Left eye:  No discharge.     Extraocular Movements: Extraocular movements intact.     Conjunctiva/sclera: Conjunctivae normal.  Cardiovascular:     Rate and Rhythm: Normal rate and regular rhythm.  Pulmonary:     Effort: Pulmonary effort is normal. No respiratory distress.     Breath sounds: Normal breath sounds. No wheezing, rhonchi or rales.  Abdominal:     General: Bowel sounds are normal.  Musculoskeletal:     Cervical back: No rigidity or tenderness.  Skin:    General: Skin is warm and dry.  Neurological:     Mental Status: She is alert and oriented to person, place, and time.  Psychiatric:        Mood and Affect: Mood normal.        Behavior: Behavior normal.      No results found for any visits on 07/17/23.    The 10-year ASCVD risk score (Arnett DK, et al., 2019) is: 15.1%    Assessment & Plan:   Essential hypertension -     Basic metabolic panel  Hyperlipidemia, unspecified hyperlipidemia type -     LDL cholesterol, direct  PND (post-nasal drip) -     Fexofenadine HCl; Take 1 tablet (180 mg total) by mouth daily.  Dispense: 90 tablet; Refill: 2  Vitamin D deficiency -     VITAMIN D 25 Hydroxy (Vit-D Deficiency, Fractures)    Return in about 6 months (around 01/17/2024).  Will continue Flonase and Allegra for postnasal drip status post COVID infection.  If it does not resolve in a few weeks she will let me know.  Will adjust vitamin D level pending results of today's check.  Continue current medications for hypertension and medicines as directed by hyperlipidemia clinic  Mliss Sax, MD

## 2023-07-24 ENCOUNTER — Other Ambulatory Visit: Payer: Self-pay | Admitting: Family Medicine

## 2023-08-03 ENCOUNTER — Other Ambulatory Visit: Payer: Self-pay | Admitting: Family Medicine

## 2023-08-03 DIAGNOSIS — Z1231 Encounter for screening mammogram for malignant neoplasm of breast: Secondary | ICD-10-CM

## 2023-08-13 ENCOUNTER — Other Ambulatory Visit: Payer: Self-pay | Admitting: Family Medicine

## 2023-08-13 DIAGNOSIS — I10 Essential (primary) hypertension: Secondary | ICD-10-CM

## 2023-08-14 ENCOUNTER — Ambulatory Visit (INDEPENDENT_AMBULATORY_CARE_PROVIDER_SITE_OTHER): Payer: Medicare Other

## 2023-08-14 DIAGNOSIS — Z23 Encounter for immunization: Secondary | ICD-10-CM | POA: Diagnosis not present

## 2023-08-14 NOTE — Progress Notes (Signed)
Per orders of Dr Doreene Burke, injection of influenza (high dose) given in RT deltiod by [facility], cma.  Patient tolerated injection well.

## 2023-08-16 DIAGNOSIS — H6123 Impacted cerumen, bilateral: Secondary | ICD-10-CM | POA: Diagnosis not present

## 2023-08-16 DIAGNOSIS — L299 Pruritus, unspecified: Secondary | ICD-10-CM | POA: Diagnosis not present

## 2023-08-22 DIAGNOSIS — H04123 Dry eye syndrome of bilateral lacrimal glands: Secondary | ICD-10-CM | POA: Diagnosis not present

## 2023-08-22 DIAGNOSIS — H02885 Meibomian gland dysfunction left lower eyelid: Secondary | ICD-10-CM | POA: Diagnosis not present

## 2023-08-22 DIAGNOSIS — H02882 Meibomian gland dysfunction right lower eyelid: Secondary | ICD-10-CM | POA: Diagnosis not present

## 2023-08-27 ENCOUNTER — Telehealth: Payer: Self-pay

## 2023-08-27 NOTE — Patient Outreach (Signed)
Care Coordination   Initial Visit Note   08/27/2023 Name: KATSUKO PRY MRN: [id number] DOB: 03/04/1951  LAURANA BATTEY is a 72 y.o. year old female who sees Mliss Sax, MD for primary care. I spoke with  [name] by phone today.  What matters to the patients health and wellness today?  Maintain health    Goals Addressed             This Visit's Progress    COMPLETED: Care Coordination Activities-No follow up required       Care Coordination Interventions: Advised patient to Annual Wellness exam. Discussed [facility] services and support. Assessed SDOH. Advised to discuss with primary care physician if services needed in the future.         SDOH assessments and interventions completed:  Yes  SDOH Interventions Today    Flowsheet Row Most Recent Value  SDOH Interventions   Food Insecurity Interventions Intervention Not Indicated  Housing Interventions Intervention Not Indicated  Health Literacy Interventions Intervention Not Indicated        Care Coordination Interventions:  Yes, provided   Follow up plan: No further intervention required.   Encounter Outcome:  Patient Visit Completed   Bary Leriche, RN, MSN [facility]  [facility], [facility] Management Community Coordinator Direct Dial: [phone]  Fax: [phone] Website: [email or web address]

## 2023-08-27 NOTE — Patient Instructions (Signed)
Visit Information  Thank you for taking time to visit with me today. Please don't hesitate to contact me if I can be of assistance to you.   Following are the goals we discussed today:   Goals Addressed             This Visit's Progress    COMPLETED: Care Coordination Activities-No follow up required       Care Coordination Interventions: Advised patient to Annual Wellness exam. Discussed [facility] services and support. Assessed SDOH. Advised to discuss with primary care physician if services needed in the future.          If you are experiencing a Mental Health or Behavioral Health Crisis or need someone to talk to, please call the Suicide and Crisis Lifeline: 988   Patient verbalizes understanding of instructions and care plan provided today and agrees to view in MyChart. Active MyChart status and patient understanding of how to access instructions and care plan via MyChart confirmed with patient.     The patient has been provided with contact information for the care management team and has been advised to call with any health related questions or concerns.   Bary Leriche, RN, MSN [facility], [facility] Management Community Coordinator Direct Dial: [phone]  Fax: [phone] Website: [email or web address]

## 2023-09-04 DIAGNOSIS — Z961 Presence of intraocular lens: Secondary | ICD-10-CM | POA: Diagnosis not present

## 2023-09-04 DIAGNOSIS — H43393 Other vitreous opacities, bilateral: Secondary | ICD-10-CM | POA: Diagnosis not present

## 2023-09-04 DIAGNOSIS — H04123 Dry eye syndrome of bilateral lacrimal glands: Secondary | ICD-10-CM | POA: Diagnosis not present

## 2023-09-04 DIAGNOSIS — H401131 Primary open-angle glaucoma, bilateral, mild stage: Secondary | ICD-10-CM | POA: Diagnosis not present

## 2023-09-06 ENCOUNTER — Other Ambulatory Visit: Payer: Self-pay | Admitting: Internal Medicine

## 2023-09-06 DIAGNOSIS — E785 Hyperlipidemia, unspecified: Secondary | ICD-10-CM

## 2023-09-07 DIAGNOSIS — E785 Hyperlipidemia, unspecified: Secondary | ICD-10-CM | POA: Diagnosis not present

## 2023-09-09 LAB — NMR, LIPOPROFILE
Cholesterol, Total: 196 mg/dL (ref 100–199)
HDL Particle Number: 45.1 umol/L (ref >=30.5)
HDL-C: 68 mg/dL (ref >39)
LDL Particle Number: 1371 nmol/L — ABNORMAL HIGH (ref <1000)
LDL Size: 20.4 nmol — ABNORMAL LOW (ref >20.5)
LDL-C (NIH Calc): 104 mg/dL — ABNORMAL HIGH (ref 0–99)
LP-IR Score: 51 — ABNORMAL HIGH (ref <=45)
Small LDL Particle Number: 639 nmol/L — ABNORMAL HIGH (ref <=527)
Triglycerides: 139 mg/dL (ref 0–149)

## 2023-09-17 ENCOUNTER — Encounter (HOSPITAL_BASED_OUTPATIENT_CLINIC_OR_DEPARTMENT_OTHER): Payer: Self-pay | Admitting: Internal Medicine

## 2023-09-17 ENCOUNTER — Ambulatory Visit (INDEPENDENT_AMBULATORY_CARE_PROVIDER_SITE_OTHER): Payer: Medicare Other | Admitting: Internal Medicine

## 2023-09-17 VITALS — BP 140/70 | HR 88 | Ht 63.5 in | Wt 130.6 lb

## 2023-09-17 DIAGNOSIS — E785 Hyperlipidemia, unspecified: Secondary | ICD-10-CM

## 2023-09-17 DIAGNOSIS — T466X5D Adverse effect of antihyperlipidemic and antiarteriosclerotic drugs, subsequent encounter: Secondary | ICD-10-CM

## 2023-09-17 DIAGNOSIS — T466X5A Adverse effect of antihyperlipidemic and antiarteriosclerotic drugs, initial encounter: Secondary | ICD-10-CM

## 2023-09-17 DIAGNOSIS — I2510 Atherosclerotic heart disease of native coronary artery without angina pectoris: Secondary | ICD-10-CM

## 2023-09-17 DIAGNOSIS — M791 Myalgia, unspecified site: Secondary | ICD-10-CM | POA: Diagnosis not present

## 2023-09-17 MED ORDER — NEXLETOL 180 MG PO TABS: 1.0000 | ORAL_TABLET | Freq: Every day | ORAL | 0 refills | Status: DC

## 2023-09-17 NOTE — Patient Instructions (Signed)
Medication Instructions:  START Nexletol samples   If tolerated, will combine with Zetia = Nexlizet  *If you need a refill on your cardiac medications before your next appointment, please call your pharmacy*   Lab Work: FASTING NMR lipoprofile in 3-4 months  If you have labs (blood work) drawn today and your tests are completely normal, you will receive your results only by: MyChart Message (if you have MyChart) OR A paper copy in the mail If you have any lab test that is abnormal or we need to change your treatment, we will call you to review the results.   Follow-Up: At [facility], you and your health needs are our priority.  As part of our continuing mission to provide you with exceptional heart care, we have created designated Provider Care Teams.  These Care Teams include your primary Cardiologist (physician) and Advanced Practice Providers (APPs -  Physician Assistants and Nurse Practitioners) who all work together to provide you with the care you need, when you need it.  We recommend signing up for the patient portal called "MyChart".  Sign up information is provided on this After Visit Summary.  MyChart is used to connect with patients for Virtual Visits (Telemedicine).  Patients are able to view lab/test results, encounter notes, upcoming appointments, etc.  Non-urgent messages can be sent to your provider as well.   To learn more about what you can do with MyChart, go to [email or web address].    Your next appointment:   3-4 months with Dr. Rennis Golden

## 2023-09-17 NOTE — Progress Notes (Signed)
OFFICE NOTE  Chief Complaint:  Routine follow-up  Primary Care Physician: Mliss Sax, MD  HPI:  Breanna Sharp is a pleasant 72 year old female with a history of hypertension and dyslipidemia. Unfortunately she's been intolerant to statins in the past and has failed both Lipitor Zocor Crestor and pravastatin. She is reported some increasing shortness of breath mostly when walking upstairs, however has been struggling with recurrent sinusitis and upper respiratory symptoms. Based on her risk factors, recommended metabolic testing. She underwent cardiopulmonary testing on 04/22/2013. She had maximal effort of 1.08 our ER. Peak VO2 was 108% predicted. Heart rate was 94% predicted. Her heart rate in view to curves were essentially normal with late flattening of her view to curve. Overall the study is low risk. She also went underwent palmar he function testing which showed normal diffusion, volume and flow loops. At her last visit she started taking Livalo samples due to an abnormal lipid profile. Her total cholesterol was 213, triglycerides 122, HDL 55 and LDL 134. Over the intial month on Livalo, she seemed to tolerate it.   Then she reported some right flank pain, especially when lifting weights, which he had started to do at the same time. She stopped her cholesterol medicine and stopped her exercise and her symptoms improved. She then restarted her exercise and had symptoms and realized that it was not likely the cholesterol medicine that was causing her right side pain. Her repeat cholesterol profile was abnormally high on only Zetia monotherapy, as she discontinued Livalo due to cramps. Her LDL particle number was 3189, LDL content was 190, HDL C. was 60 and triglycerides 148.    Breanna Sharp returns today for followup of her lipid profile. Her lipid profile in April was markedly improved with an LDL particle #1594, LDL content 105, HDL 51 and triglycerides 142. She reports some  improvement in her shortness of breath and has managed to work on exercise and has had 12 pounds of weight loss.  I saw Breanna Sharp back in the office today. Overall she is doing well except she still has some shortness of breath despite weight loss, particularly when walking up inclines. We will not been able to really establish the reason for this. Heart rate did go up to a very high level with exercise, 94% predicted on cardiopulmonary exercise testing in 2014. Is not clear whether this is an arrhythmia or just sinus tachycardia. This could explain why she feels worse when walking up hills. I like to see if we can re-create this with exercise treadmill stress testing. If there is a sharp increase in heart rate with exercise, she may benefit from a low-dose beta blocker.  05/11/2016  Breanna Sharp returns today for follow-up. She is without any significant complaints. Blood pressure was mildly elevated at 133/87 however repeat was 128/78. She is not having any problems on that area and Livalo. She is due for repeat cholesterol test. After an extensive workup we cannot find a cause from a cardiac standpoint of her shortness of breath. She was referred to [facility] and saw Dr. Isaiah Serge who performed palmar function test and feels that she might have some COPD. She's been on Spiriva and reports improvement in her shortness of breath.  06/06/2017  Breanna Sharp was seen today in follow-up. She is overall without complaints. Denies any shortness of breath or chest pain. She reports her COPD is improved significantly. She's managed to lose some weight and exercising regularly. Blood pressure  is reasonable today 134/72.  08/27/2018  Breanna Sharp returns today for follow-up.  She is done extremely well over the past year.  Her shortness of breath has been improved on inhalers.  Her COPD is stable.  Weight is fairly stable.  She exercises regularly walking between 4 and 8 miles a day.  She denies  any chest pain.  She is also changed her eating habits and recently had a repeat lipid profile which is the best that she is ever seen.  Her total cholesterol is 167, triglycerides 107, HDL 57 LDL 89.  08/28/2019  Breanna Sharp seen today for annual follow-up.  Overall she continues to do well.  She is had no worsening COPD.  She does get a little congestion which is seasonal.  She continues to walk 5 to 7 miles almost every day.  She denies any claudication.  She denies any worsening shortness of breath or chest pain.  Her lipids have been well controlled and LDL is a little higher at 474 but stable compared to study 7 months ago.  She was much better controlled about a year ago with LDL at 89.  She is on both Livalo and ezetimibe.  09/13/2020  Breanna Sharp is seen today for routine follow-up.  She reports her breathing is fairly stable with regards to COPD.  She continues to walk and is asymptomatic.  Her cholesterol still remains higher than target.  She is on ezetimibe and Livalo 1 mg.  She had previously been on the 4 mg dose with some side effects.  Her LDL C was 126 as of August 2021.  I would like to see her below 70.  EKG shows normal sinus rhythm.  12/16/2021  Breanna Sharp returns today for follow-up.  She denies any worsening chest pain or shortness of breath.  Unfortunately she cannot tolerate the Livalo when the dose was increased up to 2 mg.  She is been having some achiness in her legs.  Cholesterol remains elevated.  Given her coronary calcification, we will try to target LDL less than 100 or even possibly less than 70.  08/31/2022  Breanna Sharp is seen today for follow-up.  She is doing well on Leqvio injections.  She was able to get them fully covered by her insurance.  She has now had the 2 loading doses and is due for every 54-month dose in November.  Her labs were checked about a month after her second dose.  LDL at the time was 91.  This is down from 126 previously.  She  remains on Zetia but had to stop Livalo due to side effects which were cramps.  Those have resolved.  09/17/2023  Breanna Sharp seen today in follow-up.  She had recent repeat lipids which show essentially stable cholesterol.  Her LDL particle number is 1371 with an LDL 104, triglycerides 139 and HDL 68.  This is essentially unchanged from her lipids about a year ago.  She has now had 3 if not 4 doses of Leqvio.  She is tolerating it well however cholesterol remains above target.  She is also on ezetimibe.  Her goal LDL is less than 70.  PMHx:  Past Medical History:  Diagnosis Date   Anxiety    Arthritis    Cataract    bilaterqal sx   Dyslipidemia    statin intolerance   GERD (gastroesophageal reflux disease)    on meds   Glaucoma    bilateral sx   Heart murmur  Hyperlipidemia    on meds   Hypertension    on meds   Seasonal allergies    Uterine cancer (HCC) 1972   hysterectomy-cervical cancer   Vertigo     Past Surgical History:  Procedure Laterality Date   9 HOUR PH STUDY N/A 09/29/2019   Procedure: 24 HOUR PH STUDY;  Surgeon: Napoleon Form, MD;  Location: [facility];  Service: Endoscopy;  Laterality: N/A;   ABDOMINAL HYSTERECTOMY  1972   cervical cancer   BIOPSY  11/24/2019   Procedure: BIOPSY;  Surgeon: Napoleon Form, MD;  Location: [facility];  Service: Endoscopy;;   BRAVO PH STUDY N/A 11/24/2019   Procedure: BRAVO PH STUDY;  Surgeon: Napoleon Form, MD;  Location: [facility];  Service: Endoscopy;  Laterality: N/A;   CATARACT EXTRACTION, BILATERAL Bilateral 03/2022   COLONOSCOPY  08/2017   KN-MAC-suprep(good)-tics/TA x 3   ESOPHAGEAL MANOMETRY N/A 09/29/2019   Procedure: ESOPHAGEAL MANOMETRY (EM);  Surgeon: Napoleon Form, MD;  Location: [facility];  Service: Endoscopy;  Laterality: N/A;   ESOPHAGOGASTRODUODENOSCOPY (EGD) WITH PROPOFOL N/A 11/24/2019   Procedure: ESOPHAGOGASTRODUODENOSCOPY (EGD) WITH PROPOFOL;  Surgeon: Napoleon Form, MD;  Location: [facility];  Service: Endoscopy;  Laterality: N/A;   GLAUCOMA SURGERY Bilateral 03/2022   POLYPECTOMY  2018   TA x 3   WISDOM TOOTH EXTRACTION     uppers    FAMHx:  Family History  Problem Relation Age of Onset   Hypertension Mother        dx in her 9's   Heart attack Mother 35   Breast cancer Mother        unsure of age   Colon polyps Sister 74   Heart attack Brother 60   Diabetes Brother    Hyperlipidemia Brother    Hypertension Brother    Heart disease Brother    Colon cancer Neg Hx    Esophageal cancer Neg Hx    Rectal cancer Neg Hx    Stomach cancer Neg Hx     SOCHx:   reports that she quit smoking about 20 years ago. Her smoking use included cigarettes. She started smoking about 52 years ago. She has a 16 pack-year smoking history. She has never used smokeless tobacco. She reports that she does not currently use alcohol. She reports that she does not use drugs.  ALLERGIES:  Allergies  Allergen Reactions   Statins     Muscle cramps    ROS: Pertinent items noted in HPI and remainder of comprehensive ROS otherwise negative.  HOME MEDS: Current Outpatient Medications  Medication Sig Dispense Refill   acetaminophen (TYLENOL) 650 MG CR tablet Take 1,300 mg by mouth every 8 (eight) hours as needed for pain.     Calcium Carb-Cholecalciferol (CALCIUM + D3 PO) Take 2 tablets by mouth daily. Calcium 1200 mg and Vitamin D3 1000 IU     Carboxymethylcellul-Glycerin (LUBRICATING EYE DROPS OP) Place 2 drops into both eyes daily. IVIZIA     cholecalciferol (VITAMIN D3) 25 MCG (1000 UNIT) tablet Take 1,000 Units by mouth daily. Taking along with calcium with Vitamin D to equal 2000 units of vitamin D daily     esomeprazole (NEXIUM) 40 MG capsule TAKE 1 CAPSULE DAILY BEFORE BREAKFAST 90 capsule 3   ezetimibe (ZETIA) 10 MG tablet TAKE 1 TABLET DAILY 90 tablet 3   fexofenadine (ALLEGRA) 180 MG tablet Take 1 tablet (180 mg total) by mouth daily. 90  tablet 2   FIBER PO Take  2 capsules by mouth daily at 6 (six) AM. Fiber Choice daily prebiotic fiber supplement     fluticasone (FLONASE) 50 MCG/ACT nasal spray USE 2 SPRAYS IN EACH NOSTRIL DAILY 48 g 3   inclisiran (LEQVIO) 284 MG/1.5ML SOSY injection Inject 284 mg into the skin every 6 (six) months.     lisinopril-hydrochlorothiazide (ZESTORETIC) 20-12.5 MG tablet TAKE 1 TABLET DAILY 90 tablet 3   MIEBO 1.338 GM/ML SOLN Apply to eye.     Multiple Vitamin (MULTIVITAMIN WITH MINERALS) TABS tablet Take 1 tablet by mouth daily.     Netarsudil-Latanoprost (ROCKLATAN) 0.02-0.005 % SOLN Apply 1 drop to eye at bedtime.     SPIRIVA RESPIMAT 2.5 MCG/ACT AERS USE 2 INHALATIONS DAILY 12 g 3   No current facility-administered medications for this visit.    LABS/IMAGING: No results found for this or any previous visit (from the past 48 hour(s)). No results found.  VITALS: BP (!) 140/70 (BP Location: Left Arm, Patient Position: Sitting, Cuff Size: Normal)   Pulse 88   Ht 5' 3.5" (1.613 m)   Wt 130 lb 9.6 oz (59.2 kg)   SpO2 98%   BMI 22.77 kg/m   EXAM: Deferred  EKG: Deferred  ASSESSMENT: Dyslipidemia, goal LDL less than 70 Coronary artery calcification/ASCVD 3.   Hypertension 4.   COPD - Gold A 5.   Statin intolerance-myalgias  PLAN: 1.   Mrs. Prisbrey is doing well on Leqvio in addition to ezetimibe.  Her LDL is still well above target.  I do think she would benefit from additional therapy since her diet is optimized and her weight is well within normal range.  I would advise adding Nexletol 180 mg daily to her regimen.  If this is well-tolerated we can combine the Nexletol and ezetimibe into Nexlizet.  Would also continue the Leqvio in addition for synergistic benefit.  Plan repeat lipids in about 3 to 4 months.  Follow-up afterwards.   Chrystie Nose, MD, [facility], FACP  [place]  [facility]  Medical Director of the Advanced Lipid Disorders &  Cardiovascular Risk Reduction  Clinic Diplomate of the American Board of Clinical Lipidology Attending Cardiologist  Direct Dial: [phone]  Fax: [phone]  Website:  [email or web address] Nicely Aryan Bello 09/17/2023, 2:35 PM

## 2023-09-18 ENCOUNTER — Telehealth: Payer: Self-pay | Admitting: Internal Medicine

## 2023-09-18 ENCOUNTER — Ambulatory Visit
Admission: RE | Admit: 2023-09-18 | Discharge: 2023-09-18 | Disposition: A | Discharge status: Home / Self Care | Payer: Medicare Other | Source: Ambulatory Visit | Attending: Family Medicine | Admitting: Family Medicine

## 2023-09-18 DIAGNOSIS — Z1231 Encounter for screening mammogram for malignant neoplasm of breast: Secondary | ICD-10-CM

## 2023-09-18 NOTE — Telephone Encounter (Signed)
Spoke with patient who stated she did not want to take Nexetol due to it having to many side effects. She was schedule for a follow up appointment 02/06/24 and wants to know if she still needs to keep that appointment since she decided not to take the mediation. Please advise.

## 2023-09-18 NOTE — Telephone Encounter (Signed)
Pt c/o medication issue:  1. Name of Medication: Nexletol  2. How are you currently taking this medication (dosage and times per day)?   3. Are you having a reaction (difficulty breathing--STAT)?   4. What is your medication issue? Patient wanted Dr Rennis Golden to know that she have decide not to take the Nexletol. She said there are too many side effects. She said she would like for Jenna to please give her a call.

## 2023-09-18 NOTE — Telephone Encounter (Signed)
Left message for patient to return call.

## 2023-09-19 NOTE — Telephone Encounter (Signed)
Spoke to patient and message relayed. No questions at this time. Nexetol removed from medication list.

## 2023-10-03 DIAGNOSIS — C55 Malignant neoplasm of uterus, part unspecified: Secondary | ICD-10-CM | POA: Diagnosis not present

## 2023-10-03 DIAGNOSIS — R87610 Atypical squamous cells of undetermined significance on cytologic smear of cervix (ASC-US): Secondary | ICD-10-CM | POA: Diagnosis not present

## 2023-10-03 DIAGNOSIS — Z6824 Body mass index (BMI) 24.0-24.9, adult: Secondary | ICD-10-CM | POA: Diagnosis not present

## 2023-10-03 DIAGNOSIS — Z01419 Encounter for gynecological examination (general) (routine) without abnormal findings: Secondary | ICD-10-CM | POA: Diagnosis not present

## 2023-10-17 DIAGNOSIS — H04123 Dry eye syndrome of bilateral lacrimal glands: Secondary | ICD-10-CM | POA: Diagnosis not present

## 2023-10-24 ENCOUNTER — Other Ambulatory Visit: Payer: Self-pay

## 2023-10-24 MED ORDER — LEQVIO 284 MG/1.5ML ~~LOC~~ SOSY: 284.0000 mg | PREFILLED_SYRINGE | SUBCUTANEOUS | 0 refills | Status: AC

## 2023-10-24 NOTE — Progress Notes (Signed)
Per Eldred Manges:  Patient scheduled for Friday 11/29 at 0800 for next Leqvio injection but we have no orders in EPIC. Thanks     Per Dr Blanchie Dessert note dated 10/5:   She is doing well on Leqvio injections. She was able to get them fully covered by her insurance. She has now had the 2 loading doses and is due for every 53-month dose in November.   Order place for injection to be done.

## 2023-10-26 ENCOUNTER — Ambulatory Visit (HOSPITAL_COMMUNITY)
Admission: RE | Admit: 2023-10-26 | Discharge: 2023-10-26 | Disposition: A | Discharge status: Home / Self Care | Payer: Medicare Other | Source: Ambulatory Visit | Attending: Internal Medicine

## 2023-10-26 ENCOUNTER — Other Ambulatory Visit (HOSPITAL_COMMUNITY): Payer: Self-pay | Admitting: *Deleted

## 2023-10-26 DIAGNOSIS — E785 Hyperlipidemia, unspecified: Secondary | ICD-10-CM | POA: Insufficient documentation

## 2023-10-26 MED ORDER — INCLISIRAN SODIUM 284 MG/1.5ML ~~LOC~~ SOSY
PREFILLED_SYRINGE | SUBCUTANEOUS | Status: AC
  Filled 2023-10-26: qty 1.5

## 2023-10-26 MED ORDER — INCLISIRAN SODIUM 284 MG/1.5ML ~~LOC~~ SOSY
284.0000 mg | PREFILLED_SYRINGE | Freq: Once | SUBCUTANEOUS | Status: AC
  Administered 2023-10-26: 284 mg via SUBCUTANEOUS

## 2023-12-13 DIAGNOSIS — H6123 Impacted cerumen, bilateral: Secondary | ICD-10-CM | POA: Diagnosis not present

## 2023-12-14 ENCOUNTER — Telehealth: Payer: Self-pay | Admitting: Family Medicine

## 2023-12-14 NOTE — Telephone Encounter (Signed)
 ERROR

## 2024-01-21 ENCOUNTER — Ambulatory Visit: Payer: Medicare Other | Admitting: Family Medicine

## 2024-01-21 DIAGNOSIS — E785 Hyperlipidemia, unspecified: Secondary | ICD-10-CM | POA: Diagnosis not present

## 2024-01-22 LAB — NMR, LIPOPROFILE
Cholesterol, Total: 170 mg/dL (ref 100–199)
HDL Particle Number: 45.5 umol/L (ref >=30.5)
HDL-C: 67 mg/dL (ref >39)
LDL Particle Number: 920 nmol/L (ref <1000)
LDL Size: 20.4 nmol — ABNORMAL LOW (ref >20.5)
LDL-C (NIH Calc): 78 mg/dL (ref 0–99)
LP-IR Score: 53 — ABNORMAL HIGH (ref <=45)
Small LDL Particle Number: 486 nmol/L (ref <=527)
Triglycerides: 149 mg/dL (ref 0–149)

## 2024-01-24 ENCOUNTER — Encounter: Payer: Self-pay | Admitting: Family Medicine

## 2024-01-24 ENCOUNTER — Ambulatory Visit (INDEPENDENT_AMBULATORY_CARE_PROVIDER_SITE_OTHER): Payer: Medicare Other | Admitting: Family Medicine

## 2024-01-24 VITALS — BP 138/78 | HR 94 | Temp 98.0°F | Ht 63.0 in | Wt 132.2 lb

## 2024-01-24 DIAGNOSIS — Z131 Encounter for screening for diabetes mellitus: Secondary | ICD-10-CM

## 2024-01-24 DIAGNOSIS — I10 Essential (primary) hypertension: Secondary | ICD-10-CM | POA: Diagnosis not present

## 2024-01-24 LAB — BASIC METABOLIC PANEL
BUN: 15 mg/dL (ref 6–23)
CO2: 30 meq/L (ref 19–32)
Calcium: 10 mg/dL (ref 8.4–10.5)
Chloride: 101 meq/L (ref 96–112)
Creatinine, Ser: 0.78 mg/dL (ref 0.40–1.20)
GFR: 75.13 mL/min (ref >60.00)
Glucose, Bld: 100 mg/dL — ABNORMAL HIGH (ref 70–99)
Potassium: 3.9 meq/L (ref 3.5–5.1)
Sodium: 139 meq/L (ref 135–145)

## 2024-01-24 LAB — HEMOGLOBIN A1C: Hgb A1c MFr Bld: 5.5 % (ref 4.6–6.5)

## 2024-01-24 MED ORDER — LISINOPRIL-HYDROCHLOROTHIAZIDE 20-25 MG PO TABS
1.0000 | ORAL_TABLET | Freq: Every day | ORAL | 3 refills | Status: DC
Start: 2024-01-24 — End: 2024-10-13

## 2024-01-24 NOTE — Progress Notes (Signed)
 Established Patient Office Visit   Subjective:  Patient ID: Breanna Sharp, female    DOB: 07/09/1951  Age: 73 y.o. MRN: [id number]  Chief Complaint  Patient presents with   Medical Management of Chronic Issues    6 month follow up. Pt is fasting. No concerns.     HPI Encounter Diagnoses  Name Primary?   Screening for diabetes mellitus Yes   Essential hypertension    For follow-up of above.  Blood pressure home 120s to 140s over 70s to 80s on lisinopril/HCTZ 20/12.5.  BP elevated over the last 3 clinic visits.  History of prediabetes controlled diet and exercise.   Review of Systems  Constitutional: Negative.   HENT: Negative.    Eyes:  Negative for blurred vision, discharge and redness.  Respiratory: Negative.    Cardiovascular: Negative.   Gastrointestinal:  Negative for abdominal pain.  Genitourinary: Negative.   Musculoskeletal: Negative.  Negative for myalgias.  Skin:  Negative for rash.  Neurological:  Negative for tingling, loss of consciousness and weakness.  Endo/Heme/Allergies:  Negative for polydipsia.     Current Outpatient Medications:    acetaminophen (TYLENOL) 650 MG CR tablet, Take 1,300 mg by mouth every 8 (eight) hours as needed for pain., Disp: , Rfl:    Calcium Carb-Cholecalciferol (CALCIUM + D3 PO), Take 2 tablets by mouth daily. Calcium 1200 mg and Vitamin D3 1000 IU, Disp: , Rfl:    cholecalciferol (VITAMIN D3) 25 MCG (1000 UNIT) tablet, Take 1,000 Units by mouth daily. Taking along with calcium with Vitamin D to equal 2000 units of vitamin D daily, Disp: , Rfl:    esomeprazole (NEXIUM) 40 MG capsule, TAKE 1 CAPSULE DAILY BEFORE BREAKFAST, Disp: 90 capsule, Rfl: 3   ezetimibe (ZETIA) 10 MG tablet, TAKE 1 TABLET DAILY, Disp: 90 tablet, Rfl: 3   fexofenadine (ALLEGRA) 180 MG tablet, Take 1 tablet (180 mg total) by mouth daily., Disp: 90 tablet, Rfl: 2   FIBER PO, Take 2 capsules by mouth daily at 6 (six) AM. Fiber Choice daily prebiotic fiber  supplement, Disp: , Rfl:    fluticasone (FLONASE) 50 MCG/ACT nasal spray, USE 2 SPRAYS IN EACH NOSTRIL DAILY, Disp: 48 g, Rfl: 3   inclisiran (LEQVIO) 284 MG/1.5ML SOSY injection, Inject 1.5 mLs (284 mg total) into the skin every 6 (six) months., Disp: 1.5 mL, Rfl: 0   lisinopril-hydrochlorothiazide (ZESTORETIC) 20-25 MG tablet, Take 1 tablet by mouth daily., Disp: 90 tablet, Rfl: 3   MIEBO 1.338 GM/ML SOLN, Apply to eye., Disp: , Rfl:    Multiple Vitamin (MULTIVITAMIN WITH MINERALS) TABS tablet, Take 1 tablet by mouth daily., Disp: , Rfl:    Netarsudil-Latanoprost (ROCKLATAN) 0.02-0.005 % SOLN, Apply 1 drop to eye at bedtime., Disp: , Rfl:    SPIRIVA RESPIMAT 2.5 MCG/ACT AERS, USE 2 INHALATIONS DAILY, Disp: 12 g, Rfl: 3   Carboxymethylcellul-Glycerin (LUBRICATING EYE DROPS OP), Place 2 drops into both eyes daily. IVIZIA, Disp: , Rfl:    Objective:     BP 138/78   Pulse 94   Temp 98 F (36.7 C)   Ht 5\' 3"  (1.6 m)   Wt 132 lb 3.2 oz (60 kg)   SpO2 97%   BMI 23.42 kg/m  BP Readings from Last 3 Encounters:  01/24/24 138/78  10/26/23 (!) 164/78  09/17/23 (!) 140/70   Wt Readings from Last 3 Encounters:  01/24/24 132 lb 3.2 oz (60 kg)  09/17/23 130 lb 9.6 oz (59.2 kg)  07/17/23 130 lb (59  kg)      Physical Exam Constitutional:      General: She is not in acute distress.    Appearance: Normal appearance. She is not ill-appearing, toxic-appearing or diaphoretic.  HENT:     Head: Normocephalic and atraumatic.     Right Ear: External ear normal.     Left Ear: External ear normal.     Mouth/Throat:     Mouth: Mucous membranes are moist.     Pharynx: Oropharynx is clear. No oropharyngeal exudate or posterior oropharyngeal erythema.  Eyes:     General: No scleral icterus.       Right eye: No discharge.        Left eye: No discharge.     Extraocular Movements: Extraocular movements intact.     Conjunctiva/sclera: Conjunctivae normal.     Pupils: Pupils are equal, round, and  reactive to light.  Cardiovascular:     Rate and Rhythm: Normal rate and regular rhythm.  Pulmonary:     Effort: Pulmonary effort is normal. No respiratory distress.     Breath sounds: Normal breath sounds. No wheezing, rhonchi or rales.  Musculoskeletal:     Cervical back: No rigidity or tenderness.  Skin:    General: Skin is warm and dry.  Neurological:     Mental Status: She is alert and oriented to person, place, and time.  Psychiatric:        Mood and Affect: Mood normal.        Behavior: Behavior normal.      No results found for any visits on 01/24/24.    The 10-year ASCVD risk score (Arnett DK, et al., 2019) is: 14.1%    Assessment & Plan:   Screening for diabetes mellitus -     Basic metabolic panel -     Hemoglobin A1c  Essential hypertension -     Basic metabolic panel -     Lisinopril-hydroCHLOROthiazide; Take 1 tablet by mouth daily.  Dispense: 90 tablet; Refill: 3    Return in about 6 months (around 07/23/2024).  Have increased Zestoretic to 20/25.  Will be mindful of sodium intake.  Information on managing hypertension was given.  Continue healthy active lifestyle  Mliss Sax, MD

## 2024-02-06 ENCOUNTER — Ambulatory Visit (INDEPENDENT_AMBULATORY_CARE_PROVIDER_SITE_OTHER): Payer: Medicare Other | Admitting: Internal Medicine

## 2024-02-06 VITALS — BP 142/78 | HR 97 | Ht 63.0 in | Wt 133.9 lb

## 2024-02-06 DIAGNOSIS — E785 Hyperlipidemia, unspecified: Secondary | ICD-10-CM

## 2024-02-06 DIAGNOSIS — M791 Myalgia, unspecified site: Secondary | ICD-10-CM | POA: Diagnosis not present

## 2024-02-06 DIAGNOSIS — I2510 Atherosclerotic heart disease of native coronary artery without angina pectoris: Secondary | ICD-10-CM | POA: Diagnosis not present

## 2024-02-06 DIAGNOSIS — T466X5D Adverse effect of antihyperlipidemic and antiarteriosclerotic drugs, subsequent encounter: Secondary | ICD-10-CM | POA: Diagnosis not present

## 2024-02-06 NOTE — Patient Instructions (Signed)
 Medication Instructions:  NO CHANGES  *If you need a refill on your cardiac medications before your next appointment, please call your pharmacy*   Lab Work: FASTING lab work in about 1 year -- after your November/December Leqvio injection   If you have labs (blood work) drawn today and your tests are completely normal, you will receive your results only by: MyChart Message (if you have MyChart) OR A paper copy in the mail If you have any lab test that is abnormal or we need to change your treatment, we will call you to review the results.   Follow-Up: At [facility], you and your health needs are our priority.  As part of our continuing mission to provide you with exceptional heart care, we have created designated Provider Care Teams.  These Care Teams include your primary Cardiologist (physician) and Advanced Practice Providers (APPs -  Physician Assistants and Nurse Practitioners) who all work together to provide you with the care you need, when you need it.  We recommend signing up for the patient portal called "MyChart".  Sign up information is provided on this After Visit Summary.  MyChart is used to connect with patients for Virtual Visits (Telemedicine).  Patients are able to view lab/test results, encounter notes, upcoming appointments, etc.  Non-urgent messages can be sent to your provider as well.   To learn more about what you can do with MyChart, go to [email or web address].    Your next appointment:    10-12 months with Eligha Bridegroom NP [facility]

## 2024-02-06 NOTE — Progress Notes (Signed)
 OFFICE NOTE  Chief Complaint:  Routine follow-up  Primary Care Physician: Mliss Sax, MD  HPI:  Breanna Sharp is a pleasant 73 year old female with a history of hypertension and dyslipidemia. Unfortunately she's been intolerant to statins in the past and has failed both Lipitor Zocor Crestor and pravastatin. She is reported some increasing shortness of breath mostly when walking upstairs, however has been struggling with recurrent sinusitis and upper respiratory symptoms. Based on her risk factors, recommended metabolic testing. She underwent cardiopulmonary testing on 04/22/2013. She had maximal effort of 1.08 our ER. Peak VO2 was 108% predicted. Heart rate was 94% predicted. Her heart rate in view to curves were essentially normal with late flattening of her view to curve. Overall the study is low risk. She also went underwent palmar he function testing which showed normal diffusion, volume and flow loops. At her last visit she started taking Livalo samples due to an abnormal lipid profile. Her total cholesterol was 213, triglycerides 122, HDL 55 and LDL 134. Over the intial month on Livalo, she seemed to tolerate it.   Then she reported some right flank pain, especially when lifting weights, which he had started to do at the same time. She stopped her cholesterol medicine and stopped her exercise and her symptoms improved. She then restarted her exercise and had symptoms and realized that it was not likely the cholesterol medicine that was causing her right side pain. Her repeat cholesterol profile was abnormally high on only Zetia monotherapy, as she discontinued Livalo due to cramps. Her LDL particle number was 3189, LDL content was 190, HDL C. was 60 and triglycerides 148.    Breanna Sharp returns today for followup of her lipid profile. Her lipid profile in April was markedly improved with an LDL particle #1594, LDL content 105, HDL 51 and triglycerides 142. She reports some  improvement in her shortness of breath and has managed to work on exercise and has had 12 pounds of weight loss.  I saw Breanna Sharp back in the office today. Overall she is doing well except she still has some shortness of breath despite weight loss, particularly when walking up inclines. We will not been able to really establish the reason for this. Heart rate did go up to a very high level with exercise, 94% predicted on cardiopulmonary exercise testing in 2014. Is not clear whether this is an arrhythmia or just sinus tachycardia. This could explain why she feels worse when walking up hills. I like to see if we can re-create this with exercise treadmill stress testing. If there is a sharp increase in heart rate with exercise, she may benefit from a low-dose beta blocker.  05/11/2016  Breanna Sharp returns today for follow-up. She is without any significant complaints. Blood pressure was mildly elevated at 133/87 however repeat was 128/78. She is not having any problems on that area and Livalo. She is due for repeat cholesterol test. After an extensive workup we cannot find a cause from a cardiac standpoint of her shortness of breath. She was referred to [facility] and saw Dr. Isaiah Serge who performed palmar function test and feels that she might have some COPD. She's been on Spiriva and reports improvement in her shortness of breath.  06/06/2017  Breanna Sharp was seen today in follow-up. She is overall without complaints. Denies any shortness of breath or chest pain. She reports her COPD is improved significantly. She's managed to lose some weight and exercising regularly. Blood pressure  is reasonable today 134/72.  08/27/2018  Breanna Sharp returns today for follow-up.  She is done extremely well over the past year.  Her shortness of breath has been improved on inhalers.  Her COPD is stable.  Weight is fairly stable.  She exercises regularly walking between 4 and 8 miles a day.  She denies  any chest pain.  She is also changed her eating habits and recently had a repeat lipid profile which is the best that she is ever seen.  Her total cholesterol is 167, triglycerides 107, HDL 57 LDL 89.  08/28/2019  Breanna Sharp seen today for annual follow-up.  Overall she continues to do well.  She is had no worsening COPD.  She does get a little congestion which is seasonal.  She continues to walk 5 to 7 miles almost every day.  She denies any claudication.  She denies any worsening shortness of breath or chest pain.  Her lipids have been well controlled and LDL is a little higher at 161 but stable compared to study 7 months ago.  She was much better controlled about a year ago with LDL at 89.  She is on both Livalo and ezetimibe.  09/13/2020  Breanna Sharp is seen today for routine follow-up.  She reports her breathing is fairly stable with regards to COPD.  She continues to walk and is asymptomatic.  Her cholesterol still remains higher than target.  She is on ezetimibe and Livalo 1 mg.  She had previously been on the 4 mg dose with some side effects.  Her LDL C was 126 as of August 2021.  I would like to see her below 70.  EKG shows normal sinus rhythm.  12/16/2021  Breanna Sharp returns today for follow-up.  She denies any worsening chest pain or shortness of breath.  Unfortunately she cannot tolerate the Livalo when the dose was increased up to 2 mg.  She is been having some achiness in her legs.  Cholesterol remains elevated.  Given her coronary calcification, we will try to target LDL less than 100 or even possibly less than 70.  08/31/2022  Breanna Sharp is seen today for follow-up.  She is doing well on Leqvio injections.  She was able to get them fully covered by her insurance.  She has now had the 2 loading doses and is due for every 19-month dose in November.  Her labs were checked about a month after her second dose.  LDL at the time was 91.  This is down from 126 previously.  She  remains on Zetia but had to stop Livalo due to side effects which were cramps.  Those have resolved.  09/17/2023  Breanna Sharp seen today in follow-up.  She had recent repeat lipids which show essentially stable cholesterol.  Her LDL particle number is 1371 with an LDL 104, triglycerides 139 and HDL 68.  This is essentially unchanged from her lipids about a year ago.  She has now had 3 if not 4 doses of Leqvio.  She is tolerating it well however cholesterol remains above target.  She is also on ezetimibe.  Her goal LDL is less than 70.  02/06/2024  Breanna Sharp seen today in follow-up.  She continues to do well on Leqvio and takes ezetimibe as well.  Her cholesterol had been higher above target.  We discussed possibly adding Nexletol.  This never happened but she had repeat labs recently that does show improvement in her lipids.  She is changed  her diet somewhat but is not clear from what she is doing as to why the cholesterol is lower.  LDL particle number is now down to 920 with an LDL 78, HDL 67 and triglycerides 149.  Her small LDL particle number is 46 which is ideal.  PMHx:  Past Medical History:  Diagnosis Date   Anxiety    Arthritis    Cataract    bilaterqal sx   Dyslipidemia    statin intolerance   GERD (gastroesophageal reflux disease)    on meds   Glaucoma    bilateral sx   Heart murmur    Hyperlipidemia    on meds   Hypertension    on meds   Seasonal allergies    Uterine cancer (HCC) 1972   hysterectomy-cervical cancer   Vertigo     Past Surgical History:  Procedure Laterality Date   10 HOUR PH STUDY N/A 09/29/2019   Procedure: 24 HOUR PH STUDY;  Surgeon: Napoleon Form, MD;  Location: [facility];  Service: Endoscopy;  Laterality: N/A;   ABDOMINAL HYSTERECTOMY  1972   cervical cancer   BIOPSY  11/24/2019   Procedure: BIOPSY;  Surgeon: Napoleon Form, MD;  Location: [facility];  Service: Endoscopy;;   BRAVO PH STUDY N/A 11/24/2019   Procedure:  BRAVO PH STUDY;  Surgeon: Napoleon Form, MD;  Location: [facility];  Service: Endoscopy;  Laterality: N/A;   CATARACT EXTRACTION, BILATERAL Bilateral 03/2022   COLONOSCOPY  08/2017   KN-MAC-suprep(good)-tics/TA x 3   ESOPHAGEAL MANOMETRY N/A 09/29/2019   Procedure: ESOPHAGEAL MANOMETRY (EM);  Surgeon: Napoleon Form, MD;  Location: [facility];  Service: Endoscopy;  Laterality: N/A;   ESOPHAGOGASTRODUODENOSCOPY (EGD) WITH PROPOFOL N/A 11/24/2019   Procedure: ESOPHAGOGASTRODUODENOSCOPY (EGD) WITH PROPOFOL;  Surgeon: Napoleon Form, MD;  Location: [facility];  Service: Endoscopy;  Laterality: N/A;   GLAUCOMA SURGERY Bilateral 03/2022   POLYPECTOMY  2018   TA x 3   WISDOM TOOTH EXTRACTION     uppers    FAMHx:  Family History  Problem Relation Age of Onset   Hypertension Mother        dx in her 69's   Heart attack Mother 59   Breast cancer Mother        unsure of age   Colon polyps Sister 24   Heart attack Brother 34   Diabetes Brother    Hyperlipidemia Brother    Hypertension Brother    Heart disease Brother    Colon cancer Neg Hx    Esophageal cancer Neg Hx    Rectal cancer Neg Hx    Stomach cancer Neg Hx     SOCHx:   reports that she quit smoking about 21 years ago. Her smoking use included cigarettes. She started smoking about 53 years ago. She has a 16 pack-year smoking history. She has never used smokeless tobacco. She reports that she does not currently use alcohol. She reports that she does not use drugs.  ALLERGIES:  Allergies  Allergen Reactions   Statins     Muscle cramps    ROS: Pertinent items noted in HPI and remainder of comprehensive ROS otherwise negative.  HOME MEDS: Current Outpatient Medications  Medication Sig Dispense Refill   acetaminophen (TYLENOL) 650 MG CR tablet Take 1,300 mg by mouth every 8 (eight) hours as needed for pain.     Calcium Carb-Cholecalciferol (CALCIUM + D3 PO) Take 2 tablets by mouth daily. Calcium 1200 mg  and Vitamin D3 1000  IU     cholecalciferol (VITAMIN D3) 25 MCG (1000 UNIT) tablet Take 1,000 Units by mouth daily. Taking along with calcium with Vitamin D to equal 2000 units of vitamin D daily     esomeprazole (NEXIUM) 40 MG capsule TAKE 1 CAPSULE DAILY BEFORE BREAKFAST 90 capsule 3   ezetimibe (ZETIA) 10 MG tablet TAKE 1 TABLET DAILY 90 tablet 3   fexofenadine (ALLEGRA) 180 MG tablet Take 1 tablet (180 mg total) by mouth daily. 90 tablet 2   FIBER PO Take 2 capsules by mouth daily at 6 (six) AM. Fiber Choice daily prebiotic fiber supplement     fluticasone (FLONASE) 50 MCG/ACT nasal spray USE 2 SPRAYS IN EACH NOSTRIL DAILY 48 g 3   inclisiran (LEQVIO) 284 MG/1.5ML SOSY injection Inject 1.5 mLs (284 mg total) into the skin every 6 (six) months. 1.5 mL 0   lisinopril-hydrochlorothiazide (ZESTORETIC) 20-25 MG tablet Take 1 tablet by mouth daily. 90 tablet 3   MIEBO 1.338 GM/ML SOLN Apply to eye.     Multiple Vitamin (MULTIVITAMIN WITH MINERALS) TABS tablet Take 1 tablet by mouth daily.     Netarsudil-Latanoprost (ROCKLATAN) 0.02-0.005 % SOLN Apply 1 drop to eye at bedtime.     SPIRIVA RESPIMAT 2.5 MCG/ACT AERS USE 2 INHALATIONS DAILY 12 g 3   No current facility-administered medications for this visit.    LABS/IMAGING: No results found for this or any previous visit (from the past 48 hours). No results found.  VITALS: BP (!) 142/78   Pulse 97   Ht 5\' 3"  (1.6 m)   Wt 133 lb 14.4 oz (60.7 kg)   SpO2 96%   BMI 23.72 kg/m   EXAM: Deferred  EKG: Deferred  ASSESSMENT: Dyslipidemia, goal LDL less than 70 Coronary artery calcification/ASCVD 3.   Hypertension 4.   COPD - Gold A 5.   Statin intolerance-myalgias  PLAN: 1.   Breanna Sharp doing much better now with marked improvement in her lipids.  Her cholesterol is essentially near target with LDL at 78 but particle numbers including small LDL particle numbers are low.  I encouraged her to continue to watch saturated fats in her  diet and continue increased exercise.  Will stay on the Leqvio and ezetimibe combination.  Plan repeat lipids in a year and will arrange follow-up with our lipid APP.   Chrystie Nose, MD, [facility], FACP  [place]  [facility]  Medical Director of the [facility]  [facility] Diplomate of the American Board of Clinical Lipidology Attending Cardiologist  Direct Dial: [phone]  Fax: [phone]  Website:  [email or web address] Nicely Kyrin Gratz 02/06/2024, 10:19 AM

## 2024-02-18 ENCOUNTER — Ambulatory Visit (INDEPENDENT_AMBULATORY_CARE_PROVIDER_SITE_OTHER): Payer: Medicare Other

## 2024-02-18 DIAGNOSIS — Z0000 Encounter for general adult medical examination without abnormal findings: Secondary | ICD-10-CM | POA: Diagnosis not present

## 2024-02-18 NOTE — Patient Instructions (Signed)
 Ms. Stauder , Thank you for taking time to come for your Medicare Wellness Visit. I appreciate your ongoing commitment to your health goals. Please review the following plan we discussed and let me know if I can assist you in the future.   Referrals/Orders/Follow-Ups/Clinician Recommendations: none  This is a list of the screening recommended for you and due dates:  Health Maintenance  Topic Date Due   Zoster (Shingles) Vaccine (1 of 2) 03/22/1970   DTaP/Tdap/Td vaccine (2 - Td or Tdap) 08/21/2023   Mammogram  09/17/2024   Medicare Annual Wellness Visit  02/17/2025   Pneumonia Vaccine  Completed   Flu Shot  Completed   DEXA scan (bone density measurement)  Completed   Hepatitis C Screening  Completed   HPV Vaccine  Aged Out   Colon Cancer Screening  Discontinued   COVID-19 Vaccine  Discontinued    Advanced directives: (ACP Link)Information on Advanced Care Planning can be found at [facility] [place] Advance Health Care Directives Advance Health Care Directives. [email or web address]   Next Medicare Annual Wellness Visit scheduled for next year: Yes  insert Preventive Care attachment Insert FALL PREVENTION attachment if needed

## 2024-02-18 NOTE — Progress Notes (Signed)
 Subjective:   Breanna Sharp is a 73 y.o. who presents for a Medicare Wellness preventive visit.  Visit Complete: Virtual I connected with  Breanna Sharp on 02/18/24 by a audio enabled telemedicine application and verified that I am speaking with the correct person using two identifiers.  Patient Location: Home  Provider Location: Office/Clinic  I discussed the limitations of evaluation and management by telemedicine. The patient expressed understanding and agreed to proceed.  Vital Signs: Because this visit was a virtual/telehealth visit, some criteria may be missing or patient reported. Any vitals not documented were not able to be obtained and vitals that have been documented are patient reported.  VideoError- Librarian, academic were attempted between this provider and patient, however failed, due to patient having technical difficulties OR patient did not have access to video capability.  We continued and completed visit with audio only.   Persons Participating in Visit: Patient.  AWV Questionnaire: No: Patient Medicare AWV questionnaire was not completed prior to this visit.  Cardiac Risk Factors include: advanced age (>48men, >77 women);dyslipidemia;hypertension     Objective:    Today's Vitals   There is no height or weight on file to calculate BMI.     02/18/2024    9:36 AM 02/16/2023    9:00 AM 02/15/2022   10:51 AM 11/16/2020    9:09 AM 11/24/2019    7:29 AM 11/13/2019   11:12 AM 03/11/2019   10:24 AM  Advanced Directives  Does Patient Have a Medical Advance Directive? No No No No No Yes No  Would patient like information on creating a medical advance directive? No - Patient declined  No - Patient declined No - Patient declined No - Patient declined  No - Patient declined    Current Medications (verified) Outpatient Encounter Medications as of 02/18/2024  Medication Sig   acetaminophen (TYLENOL) 650 MG CR tablet Take 1,300 mg by  mouth every 8 (eight) hours as needed for pain.   Calcium Carb-Cholecalciferol (CALCIUM + D3 PO) Take 2 tablets by mouth daily. Calcium 1200 mg and Vitamin D3 1000 IU   cholecalciferol (VITAMIN D3) 25 MCG (1000 UNIT) tablet Take 1,000 Units by mouth daily. Taking along with calcium with Vitamin D to equal 2000 units of vitamin D daily   esomeprazole (NEXIUM) 40 MG capsule TAKE 1 CAPSULE DAILY BEFORE BREAKFAST   ezetimibe (ZETIA) 10 MG tablet TAKE 1 TABLET DAILY   fexofenadine (ALLEGRA) 180 MG tablet Take 1 tablet (180 mg total) by mouth daily.   FIBER PO Take 2 capsules by mouth daily at 6 (six) AM. Fiber Choice daily prebiotic fiber supplement   fluticasone (FLONASE) 50 MCG/ACT nasal spray USE 2 SPRAYS IN EACH NOSTRIL DAILY   inclisiran (LEQVIO) 284 MG/1.5ML SOSY injection Inject 1.5 mLs (284 mg total) into the skin every 6 (six) months.   lisinopril-hydrochlorothiazide (ZESTORETIC) 20-25 MG tablet Take 1 tablet by mouth daily.   MIEBO 1.338 GM/ML SOLN Apply to eye.   Multiple Vitamin (MULTIVITAMIN WITH MINERALS) TABS tablet Take 1 tablet by mouth daily.   Netarsudil-Latanoprost (ROCKLATAN) 0.02-0.005 % SOLN Apply 1 drop to eye at bedtime.   OVER THE COUNTER MEDICATION Organic ground premium flaxseed, takes I teaspoon daily   SPIRIVA RESPIMAT 2.5 MCG/ACT AERS USE 2 INHALATIONS DAILY   No facility-administered encounter medications on file as of 02/18/2024.    Allergies (verified) Statins   History: Past Medical History:  Diagnosis Date   Anxiety    Arthritis  Cataract    bilaterqal sx   Dyslipidemia    statin intolerance   GERD (gastroesophageal reflux disease)    on meds   Glaucoma    bilateral sx   Heart murmur    Hyperlipidemia    on meds   Hypertension    on meds   Seasonal allergies    Uterine cancer (HCC) 1972   hysterectomy-cervical cancer   Vertigo    Past Surgical History:  Procedure Laterality Date   49 HOUR PH STUDY N/A 09/29/2019   Procedure: 24 HOUR PH  STUDY;  Surgeon: Napoleon Form, MD;  Location: [facility];  Service: Endoscopy;  Laterality: N/A;   ABDOMINAL HYSTERECTOMY  1972   cervical cancer   BIOPSY  11/24/2019   Procedure: BIOPSY;  Surgeon: Napoleon Form, MD;  Location: [facility];  Service: Endoscopy;;   BRAVO PH STUDY N/A 11/24/2019   Procedure: BRAVO PH STUDY;  Surgeon: Napoleon Form, MD;  Location: [facility];  Service: Endoscopy;  Laterality: N/A;   CATARACT EXTRACTION, BILATERAL Bilateral 03/2022   COLONOSCOPY  08/2017   KN-MAC-suprep(good)-tics/TA x 3   ESOPHAGEAL MANOMETRY N/A 09/29/2019   Procedure: ESOPHAGEAL MANOMETRY (EM);  Surgeon: Napoleon Form, MD;  Location: [facility];  Service: Endoscopy;  Laterality: N/A;   ESOPHAGOGASTRODUODENOSCOPY (EGD) WITH PROPOFOL N/A 11/24/2019   Procedure: ESOPHAGOGASTRODUODENOSCOPY (EGD) WITH PROPOFOL;  Surgeon: Napoleon Form, MD;  Location: [facility];  Service: Endoscopy;  Laterality: N/A;   GLAUCOMA SURGERY Bilateral 03/2022   POLYPECTOMY  2018   TA x 3   WISDOM TOOTH EXTRACTION     uppers   Family History  Problem Relation Age of Onset   Hypertension Mother        dx in her 72's   Heart attack Mother 39   Breast cancer Mother        unsure of age   Colon polyps Sister 24   Heart attack Brother 76   Diabetes Brother    Hyperlipidemia Brother    Hypertension Brother    Heart disease Brother    Colon cancer Neg Hx    Esophageal cancer Neg Hx    Rectal cancer Neg Hx    Stomach cancer Neg Hx    Social History   Socioeconomic History   Marital status: Married    Spouse name: Breanna Sharp   Number of children: 0   Years of education: 12   Highest education level: Not on file  Occupational History    Employer: RETIRED    Comment: Retired  Tobacco Use   Smoking status: Former    Current packs/day: 0.00    Average packs/day: 0.5 packs/day for 32.0 years (16.0 ttl pk-yrs)    Types: Cigarettes    Start date: 11/27/1970    Quit date:  11/27/2002    Years since quitting: 21.2   Smokeless tobacco: Never  Vaping Use   Vaping status: Never Used  Substance and Sexual Activity   Alcohol use: Not Currently    Alcohol/week: 0.0 - 2.0 standard drinks of alcohol    Comment: social - 2 or 3 times a month 1-2 glasses of wine   Drug use: No   Sexual activity: Yes    Partners: Male  Other Topics Concern   Not on file  Social History Narrative   Exercise 3 to 4 times/week walking for 45 min -1 hour   Patient lives at home with her husband Casimiro Needle).   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   Right handed.   decaffeine Green Tea.   Social Drivers of Corporate investment banker Strain: Low Risk  (02/18/2024)   Overall Financial Resource Strain (CARDIA)    Difficulty of Paying Living Expenses: Not hard at all  Food Insecurity: No Food Insecurity (02/18/2024)   Hunger Vital Sign    Worried About Running Out of Food in the Last Year: Never true    Ran Out of Food in the Last Year: Never true  Transportation Needs: No Transportation Needs (02/18/2024)   PRAPARE - Administrator, Civil Service (Medical): No    Lack of Transportation (Non-Medical): No  Physical Activity: Sufficiently Active (02/18/2024)   Exercise Vital Sign    Days of Exercise per Week: 6 days    Minutes of Exercise per Session: 60 min  Stress: No Stress Concern Present (02/18/2024)   [employer] of Occupational Health - Occupational Stress Questionnaire    Feeling of Stress : Not at all  Social Connections: Moderately Integrated (02/18/2024)   Social Connection and Isolation Panel [NHANES]    Frequency of Communication with Friends and Family: More than three times a week    Frequency of Social Gatherings with Friends and Family: Twice a week    Attends Religious Services: More than 4 times per year    Active Member of [employer] or Organizations: No     Attends Engineer, structural: Never    Marital Status: Married    Tobacco Counseling Counseling given: Not Answered    Clinical Intake:  Pre-visit preparation completed: Yes  Pain : No/denies pain     Nutritional Risks: None Diabetes: No  Lab Results  Component Value Date   HGBA1C 5.5 01/24/2024   HGBA1C 5.6 07/17/2022   HGBA1C 5.6 07/14/2021     How often do you need to have someone help you when you read instructions, pamphlets, or other written materials from your doctor or pharmacy?: 1 - Never  Interpreter Needed?: No  Information entered by :: NAllen LPN   Activities of Daily Living     02/18/2024    9:26 AM  In your present state of health, do you have any difficulty performing the following activities:  Hearing? 0  Vision? 0  Comment has dry eyes and glaucoma  Difficulty concentrating or making decisions? 0  Walking or climbing stairs? 0  Dressing or bathing? 0  Doing errands, shopping? 0  Preparing Food and eating ? N  Using the Toilet? N  In the past six months, have you accidently leaked urine? N  Do you have problems with loss of bowel control? N  Managing your Medications? N  Managing your Finances? N  Housekeeping or managing your Housekeeping? N    Patient Care Team: Mliss Sax, MD as PCP - General (Family Medicine) Rennis Golden Lisette Abu, MD as PCP - Cardiology (Cardiology)  Indicate any recent Medical Services you may have received from other than [facility] providers in the past year (date may be approximate).     Assessment:   This is a routine wellness examination for [place].  Hearing/Vision screen Hearing Screening - Comments:: Denies hearing issues Vision Screening - Comments:: Regular eye exams, [facility]   Goals Addressed             This Visit's Progress    Patient Stated       02/18/2024, keep cholesterol and  BP down       Depression Screen     02/18/2024    9:38 AM 08/27/2023    3:40 PM 02/16/2023     9:01 AM 01/15/2023    8:09 AM 07/17/2022    8:10 AM 02/15/2022   10:52 AM 02/15/2022   10:49 AM  PHQ 2/9 Scores  PHQ - 2 Score 0 0 0 0 0 0 0  PHQ- 9 Score 1          Fall Risk     02/18/2024    9:37 AM 07/17/2023    8:04 AM 02/16/2023    9:01 AM 01/15/2023    8:09 AM 07/17/2022    8:10 AM  Fall Risk   Falls in the past year? 0 1 0 0 0  Number falls in past yr: 0 0 0 0 0  Injury with Fall? 0 0 0 0 0  Risk for fall due to : Medication side effect History of fall(s) Medication side effect No Fall Risks   Follow up Falls prevention discussed;Falls evaluation completed Falls evaluation completed Falls prevention discussed;Education provided;Falls evaluation completed Falls evaluation completed     MEDICARE RISK AT HOME:  Medicare Risk at Home Any stairs in or around the home?: Yes If so, are there any without handrails?: No Home free of loose throw rugs in walkways, pet beds, electrical cords, etc?: Yes Adequate lighting in your home to reduce risk of falls?: Yes Life alert?: No Use of a cane, walker or w/c?: No Grab bars in the bathroom?: No Shower chair or bench in shower?: Yes Elevated toilet seat or a handicapped toilet?: Yes  TIMED UP AND GO:  Was the test performed?  No  Cognitive Function: 6CIT completed        02/18/2024    9:40 AM 02/16/2023    9:05 AM 11/16/2020    9:04 AM 11/13/2019   11:20 AM 09/10/2018    8:56 AM  6CIT Screen  What Year? 0 points 0 points 0 points 0 points 0 points  What month? 0 points 0 points 0 points 0 points 0 points  What time? 0 points 0 points 0 points 0 points 0 points  Count back from 20 0 points 0 points 0 points 0 points 0 points  Months in reverse 2 points 0 points 2 points 0 points 0 points  Repeat phrase 0 points 0 points 0 points 0 points 2 points  Total Score 2 points 0 points 2 points 0 points 2 points    Immunizations Immunization History  Administered Date(s) Administered   Fluad Quad(high Dose 65+) 07/23/2019,  08/23/2020, 08/17/2021, 08/03/2022   Fluad Trivalent(High Dose 65+) 08/14/2023   Influenza Split 08/14/2012   Influenza,inj,Quad PF,6+ Mos 08/20/2013, 08/26/2014, 09/01/2015, 08/31/2016, 09/04/2017   Influenza-Unspecified 08/08/2018   PFIZER(Purple Top)SARS-COV-2 Vaccination 01/01/2020, 01/22/2020, 09/21/2020   Pneumococcal Conjugate-13 09/01/2015   Pneumococcal Polysaccharide-23 09/07/2016   Tdap 08/20/2013   Zoster, Live 09/28/2011    Screening Tests Health Maintenance  Topic Date Due   Zoster Vaccines- Shingrix (1 of 2) 03/22/1969   DTaP/Tdap/Td (2 - Td or Tdap) 08/21/2023   MAMMOGRAM  09/17/2024   Medicare Annual Wellness (AWV)  02/17/2025   Pneumonia Vaccine 56+ Years old  Completed   INFLUENZA VACCINE  Completed   DEXA SCAN  Completed   Hepatitis C Screening  Completed   HPV VACCINES  Aged Out   Colonoscopy  Discontinued   COVID-19 Vaccine  Discontinued    Health Maintenance  Health Maintenance Due  Topic Date Due   Zoster Vaccines- Shingrix (1 of 2) 03/22/1969   DTaP/Tdap/Td (2 - Td or Tdap) 08/21/2023   Health Maintenance Items Addressed: Declines Shingrix. TDAP due.  Additional Screening:  Vision Screening: Recommended annual ophthalmology exams for early detection of glaucoma and other disorders of the eye.  Dental Screening: Recommended annual dental exams for proper oral hygiene  Community Resource Referral / Chronic Care Management: CRR required this visit?  No   CCM required this visit?  No     Plan:     I have personally reviewed and noted the following in the patient's chart:   Medical and social history Use of alcohol, tobacco or illicit drugs  Current medications and supplements including opioid prescriptions. Patient is not currently taking opioid prescriptions. Functional ability and status Nutritional status Physical activity Advanced directives List of other physicians Hospitalizations, surgeries, and ER visits in previous 12  months Vitals Screenings to include cognitive, depression, and falls Referrals and appointments  In addition, I have reviewed and discussed with patient certain preventive protocols, quality metrics, and best practice recommendations. A written personalized care plan for preventive services as well as general preventive health recommendations were provided to patient.     Barb Merino, LPN   02/27/4741   After Visit Summary: (MyChart) Due to this being a telephonic visit, the after visit summary with patients personalized plan was offered to patient via MyChart   Notes: Nothing significant to report at this time.

## 2024-03-04 DIAGNOSIS — H401131 Primary open-angle glaucoma, bilateral, mild stage: Secondary | ICD-10-CM | POA: Diagnosis not present

## 2024-03-04 DIAGNOSIS — H43813 Vitreous degeneration, bilateral: Secondary | ICD-10-CM | POA: Diagnosis not present

## 2024-03-04 DIAGNOSIS — H04123 Dry eye syndrome of bilateral lacrimal glands: Secondary | ICD-10-CM | POA: Diagnosis not present

## 2024-03-04 DIAGNOSIS — H18413 Arcus senilis, bilateral: Secondary | ICD-10-CM | POA: Diagnosis not present

## 2024-03-05 ENCOUNTER — Other Ambulatory Visit: Payer: Self-pay | Admitting: Gastroenterology

## 2024-03-05 DIAGNOSIS — M5386 Other specified dorsopathies, lumbar region: Secondary | ICD-10-CM | POA: Diagnosis not present

## 2024-03-05 DIAGNOSIS — M9902 Segmental and somatic dysfunction of thoracic region: Secondary | ICD-10-CM | POA: Diagnosis not present

## 2024-03-05 DIAGNOSIS — M9901 Segmental and somatic dysfunction of cervical region: Secondary | ICD-10-CM | POA: Diagnosis not present

## 2024-03-05 DIAGNOSIS — M5134 Other intervertebral disc degeneration, thoracic region: Secondary | ICD-10-CM | POA: Diagnosis not present

## 2024-03-05 DIAGNOSIS — M9903 Segmental and somatic dysfunction of lumbar region: Secondary | ICD-10-CM | POA: Diagnosis not present

## 2024-03-05 DIAGNOSIS — M5032 Other cervical disc degeneration, mid-cervical region, unspecified level: Secondary | ICD-10-CM | POA: Diagnosis not present

## 2024-03-27 ENCOUNTER — Other Ambulatory Visit: Payer: Self-pay | Admitting: Family Medicine

## 2024-03-27 DIAGNOSIS — R0982 Postnasal drip: Secondary | ICD-10-CM

## 2024-03-27 DIAGNOSIS — H6123 Impacted cerumen, bilateral: Secondary | ICD-10-CM | POA: Diagnosis not present

## 2024-03-27 DIAGNOSIS — H61303 Acquired stenosis of external ear canal, unspecified, bilateral: Secondary | ICD-10-CM | POA: Diagnosis not present

## 2024-04-14 ENCOUNTER — Telehealth: Payer: Self-pay | Admitting: Gastroenterology

## 2024-04-14 MED ORDER — ESOMEPRAZOLE MAGNESIUM 40 MG PO CPDR: DELAYED_RELEASE_CAPSULE | ORAL | 0 refills | Status: DC

## 2024-04-14 NOTE — Telephone Encounter (Signed)
 Inbound call from patient requesting a refill for Nexium . Patient is requesting a call to discuss if she should continue taking medicine everyday. States she is doing good. Please advise, thank you.

## 2024-04-14 NOTE — Telephone Encounter (Signed)
 Spoke with the pt and made her aware she needs a f/u with Dr Nandigam for GERD.  Appt made for August and 3 month refills [facility].

## 2024-04-16 DIAGNOSIS — H01002 Unspecified blepharitis right lower eyelid: Secondary | ICD-10-CM | POA: Diagnosis not present

## 2024-04-16 DIAGNOSIS — H01005 Unspecified blepharitis left lower eyelid: Secondary | ICD-10-CM | POA: Diagnosis not present

## 2024-04-16 DIAGNOSIS — H02882 Meibomian gland dysfunction right lower eyelid: Secondary | ICD-10-CM | POA: Diagnosis not present

## 2024-04-16 DIAGNOSIS — H02885 Meibomian gland dysfunction left lower eyelid: Secondary | ICD-10-CM | POA: Diagnosis not present

## 2024-04-17 DIAGNOSIS — M9901 Segmental and somatic dysfunction of cervical region: Secondary | ICD-10-CM | POA: Diagnosis not present

## 2024-04-17 DIAGNOSIS — M5134 Other intervertebral disc degeneration, thoracic region: Secondary | ICD-10-CM | POA: Diagnosis not present

## 2024-04-17 DIAGNOSIS — M5386 Other specified dorsopathies, lumbar region: Secondary | ICD-10-CM | POA: Diagnosis not present

## 2024-04-17 DIAGNOSIS — M5032 Other cervical disc degeneration, mid-cervical region, unspecified level: Secondary | ICD-10-CM | POA: Diagnosis not present

## 2024-04-17 DIAGNOSIS — M9903 Segmental and somatic dysfunction of lumbar region: Secondary | ICD-10-CM | POA: Diagnosis not present

## 2024-04-17 DIAGNOSIS — M9902 Segmental and somatic dysfunction of thoracic region: Secondary | ICD-10-CM | POA: Diagnosis not present

## 2024-04-23 ENCOUNTER — Other Ambulatory Visit (HOSPITAL_COMMUNITY): Payer: Self-pay | Admitting: *Deleted

## 2024-04-24 ENCOUNTER — Ambulatory Visit (HOSPITAL_COMMUNITY)
Admission: RE | Admit: 2024-04-24 | Discharge: 2024-04-24 | Disposition: A | Discharge status: Home / Self Care | Payer: Medicare Other | Source: Ambulatory Visit | Attending: Internal Medicine | Admitting: Internal Medicine

## 2024-04-24 DIAGNOSIS — E785 Hyperlipidemia, unspecified: Secondary | ICD-10-CM | POA: Diagnosis not present

## 2024-04-24 MED ORDER — INCLISIRAN SODIUM 284 MG/1.5ML ~~LOC~~ SOSY
PREFILLED_SYRINGE | SUBCUTANEOUS | Status: AC
  Filled 2024-04-24: qty 1.5

## 2024-04-24 MED ORDER — INCLISIRAN SODIUM 284 MG/1.5ML ~~LOC~~ SOSY
284.0000 mg | PREFILLED_SYRINGE | Freq: Once | SUBCUTANEOUS | Status: AC
  Administered 2024-04-24: 284 mg via SUBCUTANEOUS

## 2024-05-09 ENCOUNTER — Other Ambulatory Visit: Payer: Self-pay | Admitting: Family Medicine

## 2024-05-09 DIAGNOSIS — J449 Chronic obstructive pulmonary disease, unspecified: Secondary | ICD-10-CM

## 2024-05-13 DIAGNOSIS — M5134 Other intervertebral disc degeneration, thoracic region: Secondary | ICD-10-CM | POA: Diagnosis not present

## 2024-05-13 DIAGNOSIS — M5032 Other cervical disc degeneration, mid-cervical region, unspecified level: Secondary | ICD-10-CM | POA: Diagnosis not present

## 2024-05-13 DIAGNOSIS — M9901 Segmental and somatic dysfunction of cervical region: Secondary | ICD-10-CM | POA: Diagnosis not present

## 2024-05-13 DIAGNOSIS — M9903 Segmental and somatic dysfunction of lumbar region: Secondary | ICD-10-CM | POA: Diagnosis not present

## 2024-05-13 DIAGNOSIS — M5416 Radiculopathy, lumbar region: Secondary | ICD-10-CM | POA: Diagnosis not present

## 2024-05-13 DIAGNOSIS — M9902 Segmental and somatic dysfunction of thoracic region: Secondary | ICD-10-CM | POA: Diagnosis not present

## 2024-05-22 DIAGNOSIS — M5416 Radiculopathy, lumbar region: Secondary | ICD-10-CM | POA: Diagnosis not present

## 2024-05-22 DIAGNOSIS — M9901 Segmental and somatic dysfunction of cervical region: Secondary | ICD-10-CM | POA: Diagnosis not present

## 2024-05-22 DIAGNOSIS — M5134 Other intervertebral disc degeneration, thoracic region: Secondary | ICD-10-CM | POA: Diagnosis not present

## 2024-05-22 DIAGNOSIS — M9903 Segmental and somatic dysfunction of lumbar region: Secondary | ICD-10-CM | POA: Diagnosis not present

## 2024-05-22 DIAGNOSIS — M5032 Other cervical disc degeneration, mid-cervical region, unspecified level: Secondary | ICD-10-CM | POA: Diagnosis not present

## 2024-05-22 DIAGNOSIS — M9902 Segmental and somatic dysfunction of thoracic region: Secondary | ICD-10-CM | POA: Diagnosis not present

## 2024-05-27 ENCOUNTER — Other Ambulatory Visit: Payer: Self-pay | Admitting: Family Medicine

## 2024-05-27 DIAGNOSIS — J301 Allergic rhinitis due to pollen: Secondary | ICD-10-CM

## 2024-05-29 DIAGNOSIS — M5032 Other cervical disc degeneration, mid-cervical region, unspecified level: Secondary | ICD-10-CM | POA: Diagnosis not present

## 2024-05-29 DIAGNOSIS — M9901 Segmental and somatic dysfunction of cervical region: Secondary | ICD-10-CM | POA: Diagnosis not present

## 2024-05-29 DIAGNOSIS — M9903 Segmental and somatic dysfunction of lumbar region: Secondary | ICD-10-CM | POA: Diagnosis not present

## 2024-05-29 DIAGNOSIS — M5416 Radiculopathy, lumbar region: Secondary | ICD-10-CM | POA: Diagnosis not present

## 2024-05-29 DIAGNOSIS — M5134 Other intervertebral disc degeneration, thoracic region: Secondary | ICD-10-CM | POA: Diagnosis not present

## 2024-05-29 DIAGNOSIS — M9902 Segmental and somatic dysfunction of thoracic region: Secondary | ICD-10-CM | POA: Diagnosis not present

## 2024-06-03 DIAGNOSIS — M9901 Segmental and somatic dysfunction of cervical region: Secondary | ICD-10-CM | POA: Diagnosis not present

## 2024-06-03 DIAGNOSIS — M5032 Other cervical disc degeneration, mid-cervical region, unspecified level: Secondary | ICD-10-CM | POA: Diagnosis not present

## 2024-06-03 DIAGNOSIS — M5134 Other intervertebral disc degeneration, thoracic region: Secondary | ICD-10-CM | POA: Diagnosis not present

## 2024-06-03 DIAGNOSIS — M5416 Radiculopathy, lumbar region: Secondary | ICD-10-CM | POA: Diagnosis not present

## 2024-06-03 DIAGNOSIS — M9903 Segmental and somatic dysfunction of lumbar region: Secondary | ICD-10-CM | POA: Diagnosis not present

## 2024-06-03 DIAGNOSIS — M9902 Segmental and somatic dysfunction of thoracic region: Secondary | ICD-10-CM | POA: Diagnosis not present

## 2024-06-04 DIAGNOSIS — H43393 Other vitreous opacities, bilateral: Secondary | ICD-10-CM | POA: Diagnosis not present

## 2024-06-04 DIAGNOSIS — H401131 Primary open-angle glaucoma, bilateral, mild stage: Secondary | ICD-10-CM | POA: Diagnosis not present

## 2024-06-04 DIAGNOSIS — H26492 Other secondary cataract, left eye: Secondary | ICD-10-CM | POA: Diagnosis not present

## 2024-06-04 DIAGNOSIS — H04123 Dry eye syndrome of bilateral lacrimal glands: Secondary | ICD-10-CM | POA: Diagnosis not present

## 2024-06-05 DIAGNOSIS — M5134 Other intervertebral disc degeneration, thoracic region: Secondary | ICD-10-CM | POA: Diagnosis not present

## 2024-06-05 DIAGNOSIS — M9903 Segmental and somatic dysfunction of lumbar region: Secondary | ICD-10-CM | POA: Diagnosis not present

## 2024-06-05 DIAGNOSIS — M5416 Radiculopathy, lumbar region: Secondary | ICD-10-CM | POA: Diagnosis not present

## 2024-06-05 DIAGNOSIS — M9901 Segmental and somatic dysfunction of cervical region: Secondary | ICD-10-CM | POA: Diagnosis not present

## 2024-06-05 DIAGNOSIS — M9902 Segmental and somatic dysfunction of thoracic region: Secondary | ICD-10-CM | POA: Diagnosis not present

## 2024-06-05 DIAGNOSIS — M5032 Other cervical disc degeneration, mid-cervical region, unspecified level: Secondary | ICD-10-CM | POA: Diagnosis not present

## 2024-06-10 DIAGNOSIS — M9902 Segmental and somatic dysfunction of thoracic region: Secondary | ICD-10-CM | POA: Diagnosis not present

## 2024-06-10 DIAGNOSIS — M5032 Other cervical disc degeneration, mid-cervical region, unspecified level: Secondary | ICD-10-CM | POA: Diagnosis not present

## 2024-06-10 DIAGNOSIS — M5416 Radiculopathy, lumbar region: Secondary | ICD-10-CM | POA: Diagnosis not present

## 2024-06-10 DIAGNOSIS — M9901 Segmental and somatic dysfunction of cervical region: Secondary | ICD-10-CM | POA: Diagnosis not present

## 2024-06-10 DIAGNOSIS — M9903 Segmental and somatic dysfunction of lumbar region: Secondary | ICD-10-CM | POA: Diagnosis not present

## 2024-06-10 DIAGNOSIS — M5134 Other intervertebral disc degeneration, thoracic region: Secondary | ICD-10-CM | POA: Diagnosis not present

## 2024-06-12 DIAGNOSIS — M9902 Segmental and somatic dysfunction of thoracic region: Secondary | ICD-10-CM | POA: Diagnosis not present

## 2024-06-12 DIAGNOSIS — M9901 Segmental and somatic dysfunction of cervical region: Secondary | ICD-10-CM | POA: Diagnosis not present

## 2024-06-12 DIAGNOSIS — M5134 Other intervertebral disc degeneration, thoracic region: Secondary | ICD-10-CM | POA: Diagnosis not present

## 2024-06-12 DIAGNOSIS — M9903 Segmental and somatic dysfunction of lumbar region: Secondary | ICD-10-CM | POA: Diagnosis not present

## 2024-06-12 DIAGNOSIS — M5416 Radiculopathy, lumbar region: Secondary | ICD-10-CM | POA: Diagnosis not present

## 2024-06-12 DIAGNOSIS — M5032 Other cervical disc degeneration, mid-cervical region, unspecified level: Secondary | ICD-10-CM | POA: Diagnosis not present

## 2024-06-18 DIAGNOSIS — M5134 Other intervertebral disc degeneration, thoracic region: Secondary | ICD-10-CM | POA: Diagnosis not present

## 2024-06-18 DIAGNOSIS — M9901 Segmental and somatic dysfunction of cervical region: Secondary | ICD-10-CM | POA: Diagnosis not present

## 2024-06-18 DIAGNOSIS — M9903 Segmental and somatic dysfunction of lumbar region: Secondary | ICD-10-CM | POA: Diagnosis not present

## 2024-06-18 DIAGNOSIS — M5416 Radiculopathy, lumbar region: Secondary | ICD-10-CM | POA: Diagnosis not present

## 2024-06-18 DIAGNOSIS — M5032 Other cervical disc degeneration, mid-cervical region, unspecified level: Secondary | ICD-10-CM | POA: Diagnosis not present

## 2024-06-18 DIAGNOSIS — M9902 Segmental and somatic dysfunction of thoracic region: Secondary | ICD-10-CM | POA: Diagnosis not present

## 2024-06-19 DIAGNOSIS — M9902 Segmental and somatic dysfunction of thoracic region: Secondary | ICD-10-CM | POA: Diagnosis not present

## 2024-06-19 DIAGNOSIS — M5134 Other intervertebral disc degeneration, thoracic region: Secondary | ICD-10-CM | POA: Diagnosis not present

## 2024-06-19 DIAGNOSIS — M5032 Other cervical disc degeneration, mid-cervical region, unspecified level: Secondary | ICD-10-CM | POA: Diagnosis not present

## 2024-06-19 DIAGNOSIS — M9901 Segmental and somatic dysfunction of cervical region: Secondary | ICD-10-CM | POA: Diagnosis not present

## 2024-06-19 DIAGNOSIS — M9903 Segmental and somatic dysfunction of lumbar region: Secondary | ICD-10-CM | POA: Diagnosis not present

## 2024-06-19 DIAGNOSIS — M5416 Radiculopathy, lumbar region: Secondary | ICD-10-CM | POA: Diagnosis not present

## 2024-06-27 ENCOUNTER — Other Ambulatory Visit: Payer: Self-pay | Admitting: Family Medicine

## 2024-07-03 DIAGNOSIS — H6123 Impacted cerumen, bilateral: Secondary | ICD-10-CM | POA: Diagnosis not present

## 2024-07-11 ENCOUNTER — Ambulatory Visit (INDEPENDENT_AMBULATORY_CARE_PROVIDER_SITE_OTHER): Admitting: Gastroenterology

## 2024-07-11 ENCOUNTER — Encounter: Payer: Self-pay | Admitting: Gastroenterology

## 2024-07-11 VITALS — BP 134/70 | HR 92 | Ht 63.0 in | Wt 132.0 lb

## 2024-07-11 DIAGNOSIS — K2100 Gastro-esophageal reflux disease with esophagitis, without bleeding: Secondary | ICD-10-CM

## 2024-07-11 DIAGNOSIS — K219 Gastro-esophageal reflux disease without esophagitis: Secondary | ICD-10-CM | POA: Diagnosis not present

## 2024-07-11 DIAGNOSIS — K581 Irritable bowel syndrome with constipation: Secondary | ICD-10-CM | POA: Diagnosis not present

## 2024-07-11 DIAGNOSIS — R1013 Epigastric pain: Secondary | ICD-10-CM

## 2024-07-11 MED ORDER — ESOMEPRAZOLE MAGNESIUM 20 MG PO CPDR: 20.0000 mg | DELAYED_RELEASE_CAPSULE | Freq: Every day | ORAL | 3 refills | Status: AC

## 2024-07-11 MED ORDER — ESOMEPRAZOLE MAGNESIUM 20 MG PO CPDR: 20.0000 mg | DELAYED_RELEASE_CAPSULE | Freq: Every day | ORAL | 3 refills | Status: DC

## 2024-07-11 NOTE — Progress Notes (Signed)
 Breanna Sharp    [id number]    Nov 05, 1951  Primary Care Physician:Kremer, Elsie Sayre, MD  Referring Physician: Berneta Elsie Sayre, MD [address] [place],  [place] [address]   Chief complaint:  GERD  Discussed the use of AI scribe software for clinical note transcription with the patient, who gave verbal consent to proceed.  History of Present Illness Breanna Sharp is a 73 year old female with gastroesophageal reflux disease (GERD) who presents for follow-up of her condition.  Gastroesophageal reflux symptoms - Takes Nexium, missing approximately four days per month - No heartburn or other symptoms of reflux despite occasional missed doses  Dysphagia - No difficulty swallowing food or most medications - Difficulty swallowing large pills, such as calcium and vitamin supplements - Able to swallow large pills by eating a banana or other food with them  Gastrointestinal symptoms - No abdominal pain - No melena or hematochezia  Nutritional status and weight - Weight is stable - Maintains a good diet - Engages in regular physical activity, including daily walks  Fiber supplementation - Previously used Fiber Choice, which is no longer available - Considering switching to Benefiber, available over the counter in powder and tablet forms  Colonoscopy 09/19/22 - Diverticulosis in the sigmoid colon and in the descending colon.  - Non-bleeding external and internal hemorrhoids. - The examination was otherwise normal.  EGD with 48 hr pH Bravo: Evidence of increased gastroesophageal acid reflux.  Gastric biopsies positive for eosinophilic gastritis   Esophageal manometry: 09/29/19: Showed findings of EGJ outflow obstruction   She didn't tolerate 24 pH impedance, patient had it pulled out in less than 1 hour. Per patient the tube was coiling multiple times and it was pulled and replaced multiple time irritate her nose, she was gaging and couldn't  tolerate it.   EGD 05/26/2019: showed normal esophagus with irregular Z line, mild gastritis biopsies negative for H. pylori, dysplasia, intestinal metaplasia or malignancy.  Normal duodenum   Colonoscopy September 12, 2017: Sessile polyps and left-sided diverticulosis.   Outpatient Encounter Medications as of 07/11/2024  Medication Sig   acetaminophen (TYLENOL) 650 MG CR tablet Take 1,300 mg by mouth every 8 (eight) hours as needed for pain.   Calcium Carb-Cholecalciferol (CALCIUM + D3 PO) Take 2 tablets by mouth daily. Calcium 1200 mg and Vitamin D3 1000 IU   Caraway Oil-Levomenthol (FDGARD) 25-20.75 MG CAPS Take 1-2 capsules by mouth as needed.   cholecalciferol (VITAMIN D3) 25 MCG (1000 UNIT) tablet Take 1,000 Units by mouth daily. Taking along with calcium with Vitamin D to equal 2000 units of vitamin D daily   esomeprazole (NEXIUM) 40 MG capsule TAKE 1 CAPSULE DAILY BEFORE BREAKFAST   ezetimibe (ZETIA) 10 MG tablet TAKE 1 TABLET DAILY   fexofenadine (ALLEGRA) 180 MG tablet TAKE 1 TABLET DAILY (THIS PRODUCT IS GENERIC FOR ALLEGRA)   fluticasone (FLONASE) 50 MCG/ACT nasal spray USE 2 SPRAYS IN EACH NOSTRIL DAILY   inclisiran (LEQVIO) 284 MG/1.5ML SOSY injection Inject 1.5 mLs (284 mg total) into the skin every 6 (six) months.   lisinopril-hydrochlorothiazide (ZESTORETIC) 20-25 MG tablet Take 1 tablet by mouth daily.   MIEBO 1.338 GM/ML SOLN Apply to eye.   Multiple Vitamin (MULTIVITAMIN WITH MINERALS) TABS tablet Take 1 tablet by mouth daily.   Netarsudil-Latanoprost (ROCKLATAN) 0.02-0.005 % SOLN Apply 1 drop to eye at bedtime.   OVER THE COUNTER MEDICATION Organic ground premium flaxseed, takes I teaspoon daily  SPIRIVA RESPIMAT 2.5 MCG/ACT AERS USE 2 INHALATIONS DAILY   FIBER PO Take 2 capsules by mouth daily at 6 (six) AM. Fiber Choice daily prebiotic fiber supplement (Patient not taking: Reported on 07/11/2024)   No facility-administered encounter medications on file as of 07/11/2024.     Allergies as of 07/11/2024 - Review Complete 07/11/2024  Allergen Reaction Noted   Statins  05/15/2012    Past Medical History:  Diagnosis Date   Anxiety    Arthritis    Cataract    bilaterqal sx   Dyslipidemia    statin intolerance   GERD (gastroesophageal reflux disease)    on meds   Glaucoma    bilateral sx   Heart murmur    Hyperlipidemia    on meds   Hypertension    on meds   Seasonal allergies    Uterine cancer (HCC) 1972   hysterectomy-cervical cancer   Vertigo     Past Surgical History:  Procedure Laterality Date   32 HOUR PH STUDY N/A 09/29/2019   Procedure: 24 HOUR PH STUDY;  Surgeon: Shila Gustav GAILS, MD;  Location: [facility];  Service: Endoscopy;  Laterality: N/A;   ABDOMINAL HYSTERECTOMY  1972   cervical cancer   BIOPSY  11/24/2019   Procedure: BIOPSY;  Surgeon: Shila Gustav GAILS, MD;  Location: [facility];  Service: Endoscopy;;   BRAVO PH STUDY N/A 11/24/2019   Procedure: BRAVO PH STUDY;  Surgeon: Shila Gustav GAILS, MD;  Location: [facility];  Service: Endoscopy;  Laterality: N/A;   CATARACT EXTRACTION, BILATERAL Bilateral 03/2022   COLONOSCOPY  08/2017   KN-MAC-suprep(good)-tics/TA x 3   ESOPHAGEAL MANOMETRY N/A 09/29/2019   Procedure: ESOPHAGEAL MANOMETRY (EM);  Surgeon: Shila Gustav GAILS, MD;  Location: [facility];  Service: Endoscopy;  Laterality: N/A;   ESOPHAGOGASTRODUODENOSCOPY (EGD) WITH PROPOFOL N/A 11/24/2019   Procedure: ESOPHAGOGASTRODUODENOSCOPY (EGD) WITH PROPOFOL;  Surgeon: Shila Gustav GAILS, MD;  Location: [facility];  Service: Endoscopy;  Laterality: N/A;   GLAUCOMA SURGERY Bilateral 03/2022   POLYPECTOMY  2018   TA x 3   WISDOM TOOTH EXTRACTION     uppers    Family History  Problem Relation Age of Onset   Hypertension Mother        dx in her 108's   Heart attack Mother 23   Breast cancer Mother        unsure of age   Colon polyps Sister 19   Heart attack Brother 24   Diabetes Brother     Hyperlipidemia Brother    Hypertension Brother    Heart disease Brother    Colon cancer Neg Hx    Esophageal cancer Neg Hx    Rectal cancer Neg Hx    Stomach cancer Neg Hx     Social History   Socioeconomic History   Marital status: Married    Spouse name: [name]   Number of children: 0   Years of education: 12   Highest education level: Not on file  Occupational History    Employer: RETIRED    Comment: Retired  Tobacco Use   Smoking status: Former    Current packs/day: 0.00    Average packs/day: 0.5 packs/day for 32.0 years (16.0 ttl pk-yrs)    Types: Cigarettes    Start date: 11/27/1970    Quit date: 11/27/2002    Years since quitting: 21.6   Smokeless tobacco: Never  Vaping Use   Vaping status: Never Used  Substance and Sexual Activity   Alcohol use: Not  Currently    Alcohol/week: 0.0 - 2.0 standard drinks of alcohol    Comment: social - 2 or 3 times a month 1-2 glasses of wine   Drug use: No   Sexual activity: Yes    Partners: Male  Other Topics Concern   Not on file  Social History Narrative   Exercise 3 to 4 times/week walking for 45 min -1 hour   Patient lives at home with her husband Tatiana).   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   Right handed.   decaffeine Green Tea.   Social Drivers of Corporate investment banker Strain: Low Risk  (02/18/2024)   Overall Financial Resource Strain (CARDIA)    Difficulty of Paying Living Expenses: Not hard at all  Food Insecurity: Low Risk  (03/27/2024)   Received from [facility]   Hunger Vital Sign    Within the past 12 months, you worried that your food would run out before you got money to buy more: Never true    Within the past 12 months, the food you bought just didn't last and you didn't have money to get more. : Never true  Transportation Needs: No Transportation Needs (03/27/2024)   Received from [employer]    In the past 12 months, has lack of reliable transportation kept you from medical appointments, meetings, work or from getting things needed for daily living? : No  Physical Activity: Sufficiently Active (02/18/2024)   Exercise Vital Sign    Days of Exercise per Week: 6 days    Minutes of Exercise per Session: 60 min  Stress: No Stress Concern Present (02/18/2024)   [employer] of Occupational Health - Occupational Stress Questionnaire    Feeling of Stress : Not at all  Social Connections: Moderately Integrated (02/18/2024)   Social Connection and Isolation Panel    Frequency of Communication with Friends and Family: More than three times a week    Frequency of Social Gatherings with Friends and Family: Twice a week    Attends Religious Services: More than 4 times per year    Active Member of [employer] or Organizations: No    Attends Banker Meetings: Never    Marital Status: Married  Catering manager Violence: Not At Risk (02/18/2024)   Humiliation, Afraid, Rape, and Kick questionnaire    Fear of Current or Ex-Partner: No    Emotionally Abused: No    Physically Abused: No    Sexually Abused: No      Review of systems: All other review of systems negative except as mentioned in the HPI.   Physical Exam: Vitals:   07/11/24 1359  BP: 134/70  Pulse: 92   Body mass index is 23.38 kg/m. Gen:      No acute distress HEENT:  sclera anicteric Abd:      soft, non-tender; no palpable masses, no distension Ext:    No edema Neuro: alert and oriented x 3 Psych: normal mood and affect  Data Reviewed:  Reviewed labs, radiology imaging, old records and pertinent past GI work up     Assessment and Plan Assessment & Plan Gastroesophageal reflux disease (GERD) without esophagitis GERD is well controlled with current medication regimen. No heartburn, dysphagia, abdominal pain, melena, or hematochezia reported. She occasionally misses doses of Nexium but  does not exceed four days without it. - Reduce Nexium dose to 20  mg daily to minimize side effects while maintaining symptom control.  Discussed potential long-term side effects of PPI use - Instruct to use 40 mg Nexium as needed if symptoms of GERD worsen.  Dyspepsia symptoms stable: Using FD Gard as needed - Provide samples of FD Gard.  IBS constipation: Symptoms improved - Recommend Benefiber as an alternative to Fiber Choice, available over the counter in powder or tablet form.  Return as needed.   This visit required 30 minutes of patient care (this includes precharting, chart review, review of results, face-to-face time used for counseling as well as treatment plan and follow-up. The patient was provided an opportunity to ask questions and all were answered. The patient agreed with the plan and demonstrated an understanding of the instructions.  LOIS Wilkie Mcgee , MD    CC: Berneta Elsie Sayre,*

## 2024-07-11 NOTE — Patient Instructions (Addendum)
 VISIT SUMMARY:  You came in for a follow-up on your gastroesophageal reflux disease (GERD). Your condition is well controlled with your current medication, and you are not experiencing any significant symptoms.  YOUR PLAN:  GASTROESOPHAGEAL REFLUX DISEASE (GERD): Your GERD is well controlled with your current medication regimen. You are not experiencing heartburn, difficulty swallowing, abdominal pain, or any signs of gastrointestinal bleeding. -Reduce your Nexium dose to 20 mg daily to minimize side effects while maintaining symptom control. -Use 40 mg Nexium as needed if your symptoms worsen. -We provided you with samples of FD [place]. -Consider switching to Benefiber as an alternative to Fiber Choice for fiber supplementation. Benefiber is available over the counter in powder or tablet form.  We have sent the following medications to your pharmacy ( [facility]) for you to pick up at your convenience: Nexium 20 mg  _______________________________________________________  If your blood pressure at your visit was 140/90 or greater, please contact your primary care physician to follow up on this.  _______________________________________________________  If you are age 1 or older, your body mass index should be between 23-30. Your Body mass index is 23.38 kg/m. If this is out of the aforementioned range listed, please consider follow up with your Primary Care Provider.  If you are age 29 or younger, your body mass index should be between 19-25. Your Body mass index is 23.38 kg/m. If this is out of the aformentioned range listed, please consider follow up with your Primary Care Provider.   ________________________________________________________  The [facility] providers would like to encourage you to use MYCHART to communicate with providers for non-urgent requests or questions.  Due to long hold times on the telephone, sending your provider a message by [facility] may be a faster and  more efficient way to get a response.  Please allow 48 business hours for a response.  Please remember that this is for non-urgent requests.  _______________________________________________________  [facility] is using a team-based approach to care.  Your team is made up of your doctor and two to three APPS. Our APPS (Nurse Practitioners and Physician Assistants) work with your physician to ensure care continuity for you. They are fully qualified to address your health concerns and develop a treatment plan. They communicate directly with your gastroenterologist to care for you. Seeing the Advanced Practice Practitioners on your physician's team can help you by facilitating care more promptly, often allowing for earlier appointments, access to diagnostic testing, procedures, and other specialty referrals.    I appreciate the  opportunity to care for you  Thank You   Kavitha Nandigam , MD

## 2024-08-01 ENCOUNTER — Other Ambulatory Visit: Payer: Self-pay | Admitting: Family Medicine

## 2024-08-01 DIAGNOSIS — Z1231 Encounter for screening mammogram for malignant neoplasm of breast: Secondary | ICD-10-CM

## 2024-08-05 ENCOUNTER — Encounter: Payer: Self-pay | Admitting: Family Medicine

## 2024-08-05 ENCOUNTER — Ambulatory Visit (INDEPENDENT_AMBULATORY_CARE_PROVIDER_SITE_OTHER): Payer: Medicare Other | Admitting: Family Medicine

## 2024-08-05 ENCOUNTER — Ambulatory Visit: Payer: Self-pay | Admitting: Family Medicine

## 2024-08-05 VITALS — BP 142/86 | HR 82 | Temp 97.4°F | Ht 63.0 in | Wt 128.4 lb

## 2024-08-05 DIAGNOSIS — E559 Vitamin D deficiency, unspecified: Secondary | ICD-10-CM | POA: Diagnosis not present

## 2024-08-05 DIAGNOSIS — R7303 Prediabetes: Secondary | ICD-10-CM

## 2024-08-05 DIAGNOSIS — E785 Hyperlipidemia, unspecified: Secondary | ICD-10-CM

## 2024-08-05 DIAGNOSIS — Z23 Encounter for immunization: Secondary | ICD-10-CM | POA: Diagnosis not present

## 2024-08-05 DIAGNOSIS — I10 Essential (primary) hypertension: Secondary | ICD-10-CM | POA: Diagnosis not present

## 2024-08-05 LAB — URINALYSIS, ROUTINE W REFLEX MICROSCOPIC
Bilirubin Urine: NEGATIVE
Hgb urine dipstick: NEGATIVE
Ketones, ur: NEGATIVE
Leukocytes,Ua: NEGATIVE
Nitrite: NEGATIVE
Specific Gravity, Urine: <=1.005 — AB (ref 1.000–1.030)
Total Protein, Urine: NEGATIVE
Urine Glucose: NEGATIVE
Urobilinogen, UA: 0.2 (ref 0.0–1.0)
pH: 7 (ref 5.0–8.0)

## 2024-08-05 LAB — COMPREHENSIVE METABOLIC PANEL WITH GFR
ALT: 16 U/L (ref 0–35)
AST: 19 U/L (ref 0–37)
Albumin: 4.5 g/dL (ref 3.5–5.2)
Alkaline Phosphatase: 69 U/L (ref 39–117)
BUN: 13 mg/dL (ref 6–23)
CO2: 29 meq/L (ref 19–32)
Calcium: 10.3 mg/dL (ref 8.4–10.5)
Chloride: 102 meq/L (ref 96–112)
Creatinine, Ser: 0.75 mg/dL (ref 0.40–1.20)
GFR: 78.45 mL/min (ref >60.00)
Glucose, Bld: 100 mg/dL — ABNORMAL HIGH (ref 70–99)
Potassium: 3.6 meq/L (ref 3.5–5.1)
Sodium: 139 meq/L (ref 135–145)
Total Bilirubin: 0.7 mg/dL (ref 0.2–1.2)
Total Protein: 7.2 g/dL (ref 6.0–8.3)

## 2024-08-05 LAB — CBC
HCT: 39.5 % (ref 36.0–46.0)
Hemoglobin: 12.9 g/dL (ref 12.0–15.0)
MCHC: 32.6 g/dL (ref 30.0–36.0)
MCV: 86.9 fl (ref 78.0–100.0)
Platelets: 268 K/uL (ref 150.0–400.0)
RBC: 4.55 Mil/uL (ref 3.87–5.11)
RDW: 12.7 % (ref 11.5–15.5)
WBC: 4.9 K/uL (ref 4.0–10.5)

## 2024-08-05 LAB — LIPID PANEL
Cholesterol: 183 mg/dL (ref 0–200)
HDL: 63.1 mg/dL (ref >39.00)
LDL Cholesterol: 90 mg/dL (ref 0–99)
NonHDL: 119.61
Total CHOL/HDL Ratio: 3
Triglycerides: 146 mg/dL (ref 0.0–149.0)
VLDL: 29.2 mg/dL (ref 0.0–40.0)

## 2024-08-05 LAB — HEMOGLOBIN A1C: Hgb A1c MFr Bld: 5.7 % (ref 4.6–6.5)

## 2024-08-05 LAB — VITAMIN D 25 HYDROXY (VIT D DEFICIENCY, FRACTURES): VITD: 52.13 ng/mL (ref 30.00–100.00)

## 2024-08-05 NOTE — Progress Notes (Signed)
 Established Patient Office Visit   Subjective:  Patient ID: Breanna Sharp, female    DOB: 1950/12/20  Age: 73 y.o. MRN: [id number]  Chief Complaint  Patient presents with   Medical Management of Chronic Issues    6 month follow up. Pt is fasting.     HPI Encounter Diagnoses  Name Primary?   White coat syndrome with diagnosis of hypertension Yes   Vitamin D  deficiency    Hyperlipidemia, unspecified hyperlipidemia type    Immunization due    Prediabetes    For follow-up of above.  Blood pressures at home are running in the 110-120/60-70 range.  She brings in her Omron meter with multiple readings.  She is experiencing no lightheadedness.  Continues with vitamin D3 and calcium .  Tolerating Zestoretic  20/25 well   Review of Systems  Constitutional: Negative.   HENT: Negative.    Eyes:  Negative for blurred vision, discharge and redness.  Respiratory: Negative.    Cardiovascular: Negative.   Gastrointestinal:  Negative for abdominal pain.  Genitourinary: Negative.   Musculoskeletal: Negative.  Negative for myalgias.  Skin:  Negative for rash.  Neurological:  Negative for tingling, loss of consciousness and weakness.  Endo/Heme/Allergies:  Negative for polydipsia.     Current Outpatient Medications:    acetaminophen (TYLENOL) 650 MG CR tablet, Take 1,300 mg by mouth every 8 (eight) hours as needed for pain., Disp: , Rfl:    Calcium  Carb-Cholecalciferol (CALCIUM  + D3 PO), Take 2 tablets by mouth daily. Calcium  1200 mg and Vitamin D3 1000 IU, Disp: , Rfl:    Caraway Oil-Levomenthol (FDGARD) 25-20.75 MG CAPS, Take 1-2 capsules by mouth as needed., Disp: , Rfl:    cholecalciferol (VITAMIN D3) 25 MCG (1000 UNIT) tablet, Take 1,000 Units by mouth daily. Taking along with calcium  with Vitamin D  to equal 2000 units of vitamin D  daily, Disp: , Rfl:    esomeprazole  (NEXIUM ) 20 MG capsule, Take 1 capsule (20 mg total) by mouth daily at 12 noon., Disp: 90 capsule, Rfl: 3   ezetimibe   (ZETIA ) 10 MG tablet, TAKE 1 TABLET DAILY, Disp: 90 tablet, Rfl: 3   fexofenadine  (ALLEGRA ) 180 MG tablet, TAKE 1 TABLET DAILY (THIS PRODUCT IS GENERIC FOR ALLEGRA ), Disp: 90 tablet, Rfl: 3   fluticasone  (FLONASE ) 50 MCG/ACT nasal spray, USE 2 SPRAYS IN EACH NOSTRIL DAILY, Disp: 48 g, Rfl: 3   inclisiran (LEQVIO ) 284 MG/1.5ML SOSY injection, Inject 1.5 mLs (284 mg total) into the skin every 6 (six) months., Disp: 1.5 mL, Rfl: 0   lisinopril -hydrochlorothiazide  (ZESTORETIC ) 20-25 MG tablet, Take 1 tablet by mouth daily., Disp: 90 tablet, Rfl: 3   MIEBO 1.338 GM/ML SOLN, Apply to eye., Disp: , Rfl:    Multiple Vitamin (MULTIVITAMIN WITH MINERALS) TABS tablet, Take 1 tablet by mouth daily., Disp: , Rfl:    Netarsudil-Latanoprost (ROCKLATAN) 0.02-0.005 % SOLN, Apply 1 drop to eye at bedtime., Disp: , Rfl:    OVER THE COUNTER MEDICATION, Organic ground premium flaxseed, takes I teaspoon daily, Disp: , Rfl:    SPIRIVA  RESPIMAT 2.5 MCG/ACT AERS, USE 2 INHALATIONS DAILY, Disp: 12 g, Rfl: 3   Objective:     BP (!) 142/86 (BP Location: Left Arm, Patient Position: Sitting, Cuff Size: Normal)   Pulse 82   Temp (!) 97.4 F (36.3 C) (Temporal)   Ht 5' 3 (1.6 m)   Wt 128 lb 6.4 oz (58.2 kg)   SpO2 98%   BMI 22.75 kg/m    Physical Exam Constitutional:  General: She is not in acute distress.    Appearance: Normal appearance. She is not ill-appearing, toxic-appearing or diaphoretic.  HENT:     Head: Normocephalic and atraumatic.     Right Ear: External ear normal.     Left Ear: External ear normal.     Mouth/Throat:     Mouth: Mucous membranes are moist.     Pharynx: Oropharynx is clear. No oropharyngeal exudate or posterior oropharyngeal erythema.  Eyes:     General: No scleral icterus.       Right eye: No discharge.        Left eye: No discharge.     Extraocular Movements: Extraocular movements intact.     Conjunctiva/sclera: Conjunctivae normal.     Pupils: Pupils are equal, round,  and reactive to light.  Cardiovascular:     Rate and Rhythm: Normal rate and regular rhythm.  Pulmonary:     Effort: Pulmonary effort is normal. No respiratory distress.     Breath sounds: Normal breath sounds. No wheezing or rales.  Musculoskeletal:     Cervical back: No rigidity or tenderness.  Lymphadenopathy:     Cervical: No cervical adenopathy.  Skin:    General: Skin is warm and dry.  Neurological:     Mental Status: She is alert and oriented to person, place, and time.  Psychiatric:        Mood and Affect: Mood normal.        Behavior: Behavior normal.      No results found for any visits on 08/05/24.    The 10-year ASCVD risk score (Arnett DK, et al., 2019) is: 15.5%    Assessment & Plan:   White coat syndrome with diagnosis of hypertension -     CBC -     Comprehensive metabolic panel with GFR -     Urinalysis, Routine w reflex microscopic  Vitamin D  deficiency -     VITAMIN D  25 Hydroxy (Vit-D Deficiency, Fractures)  Hyperlipidemia, unspecified hyperlipidemia type -     Comprehensive metabolic panel with GFR -     Lipid panel  Immunization due -     Flu vaccine HIGH DOSE PF(Fluzone Trivalent)  Prediabetes -     Hemoglobin A1c    Return in about 6 months (around 02/02/2025).  Will consider Prevnar 20 next visit.  Elsie Sim Lent, MD

## 2024-08-11 ENCOUNTER — Telehealth (HOSPITAL_COMMUNITY): Payer: Self-pay

## 2024-08-11 ENCOUNTER — Other Ambulatory Visit (HOSPITAL_COMMUNITY): Payer: Self-pay | Admitting: Family Medicine

## 2024-08-11 NOTE — Telephone Encounter (Signed)
 Auth Submission: NO AUTH NEEDED Site of care: Site of care: [facility] Payer: Medicare A/B, [employer] Medication & CPT/J Code(s) submitted: Leqvio  (Inclisiran) J1306 Diagnosis Code: E78.5 Route of submission (phone, fax, portal):  Phone # Fax # Auth type: Buy/Bill HB Units/visits requested: 284mg  q23months Reference number:  Approval from: 08/11/24 to 12/27/24

## 2024-09-04 DIAGNOSIS — H18413 Arcus senilis, bilateral: Secondary | ICD-10-CM | POA: Diagnosis not present

## 2024-09-04 DIAGNOSIS — H401131 Primary open-angle glaucoma, bilateral, mild stage: Secondary | ICD-10-CM | POA: Diagnosis not present

## 2024-09-04 DIAGNOSIS — H04123 Dry eye syndrome of bilateral lacrimal glands: Secondary | ICD-10-CM | POA: Diagnosis not present

## 2024-09-04 DIAGNOSIS — Z961 Presence of intraocular lens: Secondary | ICD-10-CM | POA: Diagnosis not present

## 2024-09-18 ENCOUNTER — Ambulatory Visit
Admission: RE | Admit: 2024-09-18 | Discharge: 2024-09-18 | Disposition: A | Discharge status: Home / Self Care | Source: Ambulatory Visit | Attending: Family Medicine | Admitting: Family Medicine

## 2024-09-18 DIAGNOSIS — Z1231 Encounter for screening mammogram for malignant neoplasm of breast: Secondary | ICD-10-CM | POA: Diagnosis not present

## 2024-10-03 DIAGNOSIS — Z6823 Body mass index (BMI) 23.0-23.9, adult: Secondary | ICD-10-CM | POA: Diagnosis not present

## 2024-10-03 DIAGNOSIS — C55 Malignant neoplasm of uterus, part unspecified: Secondary | ICD-10-CM | POA: Diagnosis not present

## 2024-10-03 DIAGNOSIS — Z01419 Encounter for gynecological examination (general) (routine) without abnormal findings: Secondary | ICD-10-CM | POA: Diagnosis not present

## 2024-10-03 DIAGNOSIS — Z779 Other contact with and (suspected) exposures hazardous to health: Secondary | ICD-10-CM | POA: Diagnosis not present

## 2024-10-03 DIAGNOSIS — Z1272 Encounter for screening for malignant neoplasm of vagina: Secondary | ICD-10-CM | POA: Diagnosis not present

## 2024-10-03 DIAGNOSIS — Z1151 Encounter for screening for human papillomavirus (HPV): Secondary | ICD-10-CM | POA: Diagnosis not present

## 2024-10-08 DIAGNOSIS — H6123 Impacted cerumen, bilateral: Secondary | ICD-10-CM | POA: Diagnosis not present

## 2024-10-08 DIAGNOSIS — H61303 Acquired stenosis of external ear canal, unspecified, bilateral: Secondary | ICD-10-CM | POA: Diagnosis not present

## 2024-10-13 ENCOUNTER — Telehealth: Payer: Self-pay

## 2024-10-13 ENCOUNTER — Other Ambulatory Visit: Payer: Self-pay

## 2024-10-13 DIAGNOSIS — I10 Essential (primary) hypertension: Secondary | ICD-10-CM

## 2024-10-13 MED ORDER — LISINOPRIL-HYDROCHLOROTHIAZIDE 20-25 MG PO TABS
1.0000 | ORAL_TABLET | Freq: Every day | ORAL | 0 refills | Status: AC
Start: 2024-10-13 — End: ?

## 2024-10-13 NOTE — Telephone Encounter (Signed)
 Copied from CRM #[id number]. Topic: Clinical - Prescription Issue >> Oct 13, 2024 12:40 PM Pinkey ORN wrote: Reason for CRM: lisinopril -hydrochlorothiazide  (ZESTORETIC ) 20-25 MG tablet >> Oct 13, 2024 12:45 PM Pinkey ORN wrote: Patient is requesting a 90-day supply.  >> Oct 13, 2024 12:44 PM Pinkey ORN wrote: [facility]'t [facility] any lisinopril -hydrochlorothiazide  (ZESTORETIC ) 20-25 MG tablet in stock and is uncertain of when they'll be receiving any. Patient wants to have this prescription sent to her local [id number] - [place], [place] - [address] AT [facility] OF [address] & [address]  [address], [place] [address]

## 2024-10-22 ENCOUNTER — Other Ambulatory Visit: Payer: Self-pay | Admitting: *Deleted

## 2024-10-22 DIAGNOSIS — E785 Hyperlipidemia, unspecified: Secondary | ICD-10-CM

## 2024-10-27 ENCOUNTER — Ambulatory Visit (HOSPITAL_COMMUNITY)
Admission: RE | Admit: 2024-10-27 | Discharge: 2024-10-27 | Disposition: A | Source: Ambulatory Visit | Attending: Internal Medicine

## 2024-10-27 VITALS — BP 142/81 | HR 91 | Temp 97.3°F | Resp 16

## 2024-10-27 DIAGNOSIS — E785 Hyperlipidemia, unspecified: Secondary | ICD-10-CM | POA: Insufficient documentation

## 2024-10-27 MED ORDER — INCLISIRAN SODIUM 284 MG/1.5ML ~~LOC~~ SOSY
PREFILLED_SYRINGE | SUBCUTANEOUS | Status: AC
  Filled 2024-10-27: qty 1.5

## 2024-10-27 MED ORDER — INCLISIRAN SODIUM 284 MG/1.5ML ~~LOC~~ SOSY
284.0000 mg | PREFILLED_SYRINGE | Freq: Once | SUBCUTANEOUS | Status: AC
  Administered 2024-10-27: 284 mg via SUBCUTANEOUS

## 2024-10-28 NOTE — Telephone Encounter (Signed)
 Left message for patient, per her request. Advised can call back or send MyChart message w/her questions/concerns.

## 2025-02-03 ENCOUNTER — Ambulatory Visit: Admitting: Family Medicine

## 2025-02-20 ENCOUNTER — Ambulatory Visit

## 2025-02-24 ENCOUNTER — Ambulatory Visit

## 2025-04-28 ENCOUNTER — Encounter (HOSPITAL_COMMUNITY)
